# Patient Record
Sex: Female | Born: 1970
Health system: Southern US, Community
[De-identification: ages and names within clinical notes are randomized; demographics above are authoritative.]

## PROBLEM LIST (undated history)

## (undated) DIAGNOSIS — M797 Fibromyalgia: Secondary | ICD-10-CM

## (undated) DIAGNOSIS — R7881 Bacteremia: Secondary | ICD-10-CM

## (undated) DIAGNOSIS — Z87442 Personal history of urinary calculi: Secondary | ICD-10-CM

## (undated) DIAGNOSIS — M069 Rheumatoid arthritis, unspecified: Secondary | ICD-10-CM

## (undated) DIAGNOSIS — R3915 Urgency of urination: Secondary | ICD-10-CM

## (undated) DIAGNOSIS — J45909 Unspecified asthma, uncomplicated: Secondary | ICD-10-CM

## (undated) DIAGNOSIS — F319 Bipolar disorder, unspecified: Secondary | ICD-10-CM

## (undated) DIAGNOSIS — K7682 Hepatic encephalopathy: Secondary | ICD-10-CM

## (undated) DIAGNOSIS — J189 Pneumonia, unspecified organism: Secondary | ICD-10-CM

## (undated) DIAGNOSIS — Z973 Presence of spectacles and contact lenses: Secondary | ICD-10-CM

## (undated) DIAGNOSIS — E43 Unspecified severe protein-calorie malnutrition: Secondary | ICD-10-CM

## (undated) DIAGNOSIS — G43909 Migraine, unspecified, not intractable, without status migrainosus: Secondary | ICD-10-CM

## (undated) DIAGNOSIS — K3184 Gastroparesis: Secondary | ICD-10-CM

## (undated) DIAGNOSIS — K589 Irritable bowel syndrome without diarrhea: Secondary | ICD-10-CM

## (undated) DIAGNOSIS — I959 Hypotension, unspecified: Secondary | ICD-10-CM

## (undated) DIAGNOSIS — Z7289 Other problems related to lifestyle: Secondary | ICD-10-CM

## (undated) DIAGNOSIS — K7200 Acute and subacute hepatic failure without coma: Secondary | ICD-10-CM

## (undated) DIAGNOSIS — E039 Hypothyroidism, unspecified: Secondary | ICD-10-CM

## (undated) DIAGNOSIS — B9620 Unspecified Escherichia coli [E. coli] as the cause of diseases classified elsewhere: Secondary | ICD-10-CM

## (undated) DIAGNOSIS — K219 Gastro-esophageal reflux disease without esophagitis: Secondary | ICD-10-CM

## (undated) DIAGNOSIS — F411 Generalized anxiety disorder: Secondary | ICD-10-CM

## (undated) DIAGNOSIS — R112 Nausea with vomiting, unspecified: Secondary | ICD-10-CM

## (undated) DIAGNOSIS — N201 Calculus of ureter: Secondary | ICD-10-CM

## (undated) DIAGNOSIS — Z8639 Personal history of other endocrine, nutritional and metabolic disease: Secondary | ICD-10-CM

## (undated) DIAGNOSIS — N189 Chronic kidney disease, unspecified: Secondary | ICD-10-CM

## (undated) DIAGNOSIS — K449 Diaphragmatic hernia without obstruction or gangrene: Secondary | ICD-10-CM

## (undated) DIAGNOSIS — F329 Major depressive disorder, single episode, unspecified: Secondary | ICD-10-CM

## (undated) DIAGNOSIS — Z9889 Other specified postprocedural states: Secondary | ICD-10-CM

## (undated) DIAGNOSIS — E079 Disorder of thyroid, unspecified: Secondary | ICD-10-CM

## (undated) DIAGNOSIS — F32A Depression, unspecified: Secondary | ICD-10-CM

## (undated) HISTORY — DX: Bacteremia: R78.81

## (undated) HISTORY — PX: ABDOMINAL HYSTERECTOMY: SHX81

## (undated) HISTORY — PX: TONSILLECTOMY AND ADENOIDECTOMY: SUR1326

## (undated) HISTORY — PX: KNEE ARTHROSCOPY: SUR90

## (undated) HISTORY — DX: Hepatic encephalopathy: K76.82

## (undated) HISTORY — DX: Unspecified Escherichia coli (E. coli) as the cause of diseases classified elsewhere: B96.20

## (undated) HISTORY — DX: Acute and subacute hepatic failure without coma: K72.00

## (undated) HISTORY — PX: EXTRACORPOREAL SHOCK WAVE LITHOTRIPSY: SHX1557

## (undated) HISTORY — DX: Disorder of thyroid, unspecified: E07.9

## (undated) HISTORY — DX: Unspecified severe protein-calorie malnutrition: E43

## (undated) HISTORY — DX: Major depressive disorder, single episode, unspecified: F32.9

## (undated) HISTORY — PX: ESOPHAGOGASTRODUODENOSCOPY: SHX1529

---

## 1998-01-08 ENCOUNTER — Inpatient Hospital Stay (HOSPITAL_COMMUNITY): Admission: AD | Admit: 1998-01-08 | Discharge: 1998-01-08 | Payer: Self-pay | Admitting: Obstetrics and Gynecology

## 1998-03-04 ENCOUNTER — Inpatient Hospital Stay (HOSPITAL_COMMUNITY): Admission: AD | Admit: 1998-03-04 | Discharge: 1998-03-04 | Payer: Self-pay | Admitting: Obstetrics and Gynecology

## 1998-03-06 ENCOUNTER — Inpatient Hospital Stay (HOSPITAL_COMMUNITY): Admission: AD | Admit: 1998-03-06 | Discharge: 1998-03-06 | Payer: Self-pay | Admitting: Obstetrics and Gynecology

## 1998-03-13 ENCOUNTER — Inpatient Hospital Stay (HOSPITAL_COMMUNITY): Admission: AD | Admit: 1998-03-13 | Discharge: 1998-03-15 | Payer: Self-pay | Admitting: Obstetrics and Gynecology

## 1998-04-18 ENCOUNTER — Other Ambulatory Visit: Admission: RE | Admit: 1998-04-18 | Discharge: 1998-04-18 | Payer: Self-pay | Admitting: Obstetrics and Gynecology

## 1998-06-09 ENCOUNTER — Ambulatory Visit (HOSPITAL_COMMUNITY): Admission: RE | Admit: 1998-06-09 | Discharge: 1998-06-09 | Payer: Self-pay | Admitting: Neurology

## 1998-06-10 ENCOUNTER — Ambulatory Visit (HOSPITAL_COMMUNITY): Admission: RE | Admit: 1998-06-10 | Discharge: 1998-06-10 | Payer: Self-pay | Admitting: Neurology

## 1998-09-11 ENCOUNTER — Encounter: Admission: RE | Admit: 1998-09-11 | Discharge: 1998-12-10 | Payer: Self-pay | Admitting: Family Medicine

## 1999-06-25 ENCOUNTER — Other Ambulatory Visit: Admission: RE | Admit: 1999-06-25 | Discharge: 1999-06-25 | Payer: Self-pay | Admitting: Obstetrics and Gynecology

## 1999-11-02 HISTORY — PX: ROUX-EN-Y GASTRIC BYPASS: SHX1104

## 1999-11-16 ENCOUNTER — Encounter: Payer: Self-pay | Admitting: *Deleted

## 1999-11-16 ENCOUNTER — Ambulatory Visit (HOSPITAL_COMMUNITY): Admission: RE | Admit: 1999-11-16 | Discharge: 1999-11-16 | Payer: Self-pay | Admitting: *Deleted

## 1999-11-16 ENCOUNTER — Encounter: Admission: RE | Admit: 1999-11-16 | Discharge: 1999-11-16 | Payer: Self-pay | Admitting: *Deleted

## 1999-12-31 ENCOUNTER — Encounter: Admission: RE | Admit: 1999-12-31 | Discharge: 1999-12-31 | Payer: Self-pay | Admitting: *Deleted

## 1999-12-31 ENCOUNTER — Encounter: Payer: Self-pay | Admitting: *Deleted

## 2000-01-05 ENCOUNTER — Ambulatory Visit (HOSPITAL_COMMUNITY): Admission: RE | Admit: 2000-01-05 | Discharge: 2000-01-05 | Payer: Self-pay | Admitting: *Deleted

## 2000-01-05 ENCOUNTER — Encounter: Payer: Self-pay | Admitting: *Deleted

## 2000-01-28 ENCOUNTER — Ambulatory Visit (HOSPITAL_BASED_OUTPATIENT_CLINIC_OR_DEPARTMENT_OTHER): Admission: RE | Admit: 2000-01-28 | Discharge: 2000-01-28 | Payer: Self-pay | Admitting: Orthopedic Surgery

## 2000-01-28 HISTORY — PX: WRIST GANGLION EXCISION: SUR520

## 2000-07-27 ENCOUNTER — Encounter (INDEPENDENT_AMBULATORY_CARE_PROVIDER_SITE_OTHER): Payer: Self-pay | Admitting: Specialist

## 2000-07-27 ENCOUNTER — Ambulatory Visit (HOSPITAL_COMMUNITY): Admission: RE | Admit: 2000-07-27 | Discharge: 2000-07-27 | Payer: Self-pay | Admitting: *Deleted

## 2000-08-23 ENCOUNTER — Other Ambulatory Visit: Admission: RE | Admit: 2000-08-23 | Discharge: 2000-08-23 | Payer: Self-pay | Admitting: Obstetrics and Gynecology

## 2000-11-15 ENCOUNTER — Emergency Department (HOSPITAL_COMMUNITY): Admission: EM | Admit: 2000-11-15 | Discharge: 2000-11-15 | Payer: Self-pay | Admitting: Emergency Medicine

## 2000-11-15 ENCOUNTER — Encounter: Payer: Self-pay | Admitting: Emergency Medicine

## 2000-11-20 HISTORY — PX: OTHER SURGICAL HISTORY: SHX169

## 2000-11-21 ENCOUNTER — Ambulatory Visit (HOSPITAL_COMMUNITY): Admission: RE | Admit: 2000-11-21 | Discharge: 2000-11-21 | Payer: Self-pay | Admitting: Urology

## 2000-11-21 ENCOUNTER — Encounter: Payer: Self-pay | Admitting: Urology

## 2001-02-02 ENCOUNTER — Encounter: Payer: Self-pay | Admitting: Urology

## 2001-02-02 ENCOUNTER — Encounter: Admission: RE | Admit: 2001-02-02 | Discharge: 2001-02-02 | Payer: Self-pay | Admitting: Urology

## 2001-06-08 ENCOUNTER — Encounter: Admission: RE | Admit: 2001-06-08 | Discharge: 2001-06-08 | Payer: Self-pay | Admitting: *Deleted

## 2001-06-08 ENCOUNTER — Encounter: Payer: Self-pay | Admitting: *Deleted

## 2001-09-14 ENCOUNTER — Other Ambulatory Visit: Admission: RE | Admit: 2001-09-14 | Discharge: 2001-09-14 | Payer: Self-pay | Admitting: Obstetrics and Gynecology

## 2001-10-27 ENCOUNTER — Emergency Department (HOSPITAL_COMMUNITY): Admission: EM | Admit: 2001-10-27 | Discharge: 2001-10-27 | Payer: Self-pay | Admitting: Emergency Medicine

## 2001-11-05 ENCOUNTER — Emergency Department (HOSPITAL_COMMUNITY): Admission: EM | Admit: 2001-11-05 | Discharge: 2001-11-05 | Payer: Self-pay | Admitting: Emergency Medicine

## 2002-03-07 ENCOUNTER — Ambulatory Visit (HOSPITAL_COMMUNITY): Admission: RE | Admit: 2002-03-07 | Discharge: 2002-03-07 | Payer: Self-pay | Admitting: Urology

## 2002-03-07 ENCOUNTER — Encounter: Admission: RE | Admit: 2002-03-07 | Discharge: 2002-03-07 | Payer: Self-pay | Admitting: Urology

## 2002-03-07 ENCOUNTER — Encounter: Payer: Self-pay | Admitting: Urology

## 2002-07-06 ENCOUNTER — Encounter: Admission: RE | Admit: 2002-07-06 | Discharge: 2002-07-06 | Payer: Self-pay | Admitting: Urology

## 2002-07-06 ENCOUNTER — Encounter: Payer: Self-pay | Admitting: Urology

## 2002-07-09 ENCOUNTER — Ambulatory Visit (HOSPITAL_BASED_OUTPATIENT_CLINIC_OR_DEPARTMENT_OTHER): Admission: RE | Admit: 2002-07-09 | Discharge: 2002-07-09 | Payer: Self-pay | Admitting: Urology

## 2002-07-09 HISTORY — PX: OTHER SURGICAL HISTORY: SHX169

## 2002-07-25 ENCOUNTER — Encounter: Admission: RE | Admit: 2002-07-25 | Discharge: 2002-07-25 | Payer: Self-pay | Admitting: Urology

## 2002-07-25 ENCOUNTER — Encounter: Payer: Self-pay | Admitting: Urology

## 2002-12-26 ENCOUNTER — Other Ambulatory Visit: Admission: RE | Admit: 2002-12-26 | Discharge: 2002-12-26 | Payer: Self-pay | Admitting: Obstetrics and Gynecology

## 2003-02-26 ENCOUNTER — Encounter (INDEPENDENT_AMBULATORY_CARE_PROVIDER_SITE_OTHER): Payer: Self-pay | Admitting: Specialist

## 2003-02-26 ENCOUNTER — Ambulatory Visit (HOSPITAL_COMMUNITY): Admission: RE | Admit: 2003-02-26 | Discharge: 2003-02-26 | Payer: Self-pay | Admitting: Obstetrics and Gynecology

## 2003-02-26 HISTORY — PX: OTHER SURGICAL HISTORY: SHX169

## 2003-02-27 ENCOUNTER — Inpatient Hospital Stay (HOSPITAL_COMMUNITY): Admission: AD | Admit: 2003-02-27 | Discharge: 2003-02-27 | Payer: Self-pay | Admitting: Obstetrics and Gynecology

## 2004-01-15 ENCOUNTER — Encounter: Admission: RE | Admit: 2004-01-15 | Discharge: 2004-01-15 | Payer: Self-pay | Admitting: Internal Medicine

## 2004-01-17 ENCOUNTER — Encounter (INDEPENDENT_AMBULATORY_CARE_PROVIDER_SITE_OTHER): Payer: Self-pay | Admitting: *Deleted

## 2004-01-17 ENCOUNTER — Ambulatory Visit (HOSPITAL_COMMUNITY): Admission: RE | Admit: 2004-01-17 | Discharge: 2004-01-18 | Payer: Self-pay | Admitting: General Surgery

## 2004-01-17 HISTORY — PX: LAPAROSCOPIC CHOLECYSTECTOMY: SUR755

## 2004-01-27 ENCOUNTER — Ambulatory Visit (HOSPITAL_COMMUNITY): Admission: RE | Admit: 2004-01-27 | Discharge: 2004-01-27 | Payer: Self-pay | Admitting: Urology

## 2005-11-19 ENCOUNTER — Other Ambulatory Visit: Admission: RE | Admit: 2005-11-19 | Discharge: 2005-11-19 | Payer: Self-pay | Admitting: Obstetrics and Gynecology

## 2005-12-28 ENCOUNTER — Observation Stay (HOSPITAL_COMMUNITY): Admission: RE | Admit: 2005-12-28 | Discharge: 2005-12-29 | Payer: Self-pay | Admitting: Obstetrics and Gynecology

## 2005-12-28 ENCOUNTER — Encounter (INDEPENDENT_AMBULATORY_CARE_PROVIDER_SITE_OTHER): Payer: Self-pay | Admitting: *Deleted

## 2005-12-28 HISTORY — PX: LAPAROSCOPIC ASSISTED VAGINAL HYSTERECTOMY: SHX5398

## 2006-12-16 ENCOUNTER — Ambulatory Visit (HOSPITAL_COMMUNITY): Admission: RE | Admit: 2006-12-16 | Discharge: 2006-12-16 | Payer: Self-pay | Admitting: Urology

## 2006-12-16 HISTORY — PX: OTHER SURGICAL HISTORY: SHX169

## 2007-03-13 ENCOUNTER — Ambulatory Visit (HOSPITAL_COMMUNITY): Admission: RE | Admit: 2007-03-13 | Discharge: 2007-03-13 | Payer: Self-pay | Admitting: Internal Medicine

## 2007-07-07 ENCOUNTER — Encounter: Admission: RE | Admit: 2007-07-07 | Discharge: 2007-07-07 | Payer: Self-pay | Admitting: Internal Medicine

## 2007-12-08 ENCOUNTER — Encounter: Admission: RE | Admit: 2007-12-08 | Discharge: 2007-12-08 | Payer: Self-pay | Admitting: Obstetrics and Gynecology

## 2008-06-07 ENCOUNTER — Encounter: Admission: RE | Admit: 2008-06-07 | Discharge: 2008-06-07 | Payer: Self-pay | Admitting: Obstetrics and Gynecology

## 2008-12-09 ENCOUNTER — Encounter: Admission: RE | Admit: 2008-12-09 | Discharge: 2008-12-09 | Payer: Self-pay | Admitting: Obstetrics and Gynecology

## 2009-02-21 ENCOUNTER — Ambulatory Visit (HOSPITAL_BASED_OUTPATIENT_CLINIC_OR_DEPARTMENT_OTHER): Admission: RE | Admit: 2009-02-21 | Discharge: 2009-02-22 | Payer: Self-pay | Admitting: Urology

## 2009-02-21 HISTORY — PX: OTHER SURGICAL HISTORY: SHX169

## 2010-06-12 ENCOUNTER — Ambulatory Visit (HOSPITAL_BASED_OUTPATIENT_CLINIC_OR_DEPARTMENT_OTHER): Admission: RE | Admit: 2010-06-12 | Discharge: 2010-06-12 | Payer: Self-pay | Admitting: Urology

## 2010-06-12 HISTORY — PX: OTHER SURGICAL HISTORY: SHX169

## 2010-07-01 ENCOUNTER — Encounter: Admission: RE | Admit: 2010-07-01 | Discharge: 2010-07-01 | Payer: Self-pay | Admitting: Surgery

## 2010-07-10 ENCOUNTER — Ambulatory Visit (HOSPITAL_COMMUNITY): Admission: RE | Admit: 2010-07-10 | Discharge: 2010-07-10 | Payer: Self-pay | Admitting: Surgery

## 2010-08-03 ENCOUNTER — Encounter (INDEPENDENT_AMBULATORY_CARE_PROVIDER_SITE_OTHER): Payer: Self-pay | Admitting: Surgery

## 2010-08-03 ENCOUNTER — Inpatient Hospital Stay (HOSPITAL_COMMUNITY): Admission: RE | Admit: 2010-08-03 | Discharge: 2010-08-04 | Payer: Self-pay | Admitting: Surgery

## 2010-08-03 HISTORY — PX: OTHER SURGICAL HISTORY: SHX169

## 2010-11-21 ENCOUNTER — Encounter: Payer: Self-pay | Admitting: Internal Medicine

## 2011-01-13 LAB — CALCIUM: Calcium: 8.2 mg/dL — ABNORMAL LOW (ref 8.4–10.5)

## 2011-01-14 LAB — COMPREHENSIVE METABOLIC PANEL
ALT: 24 U/L (ref 0–35)
AST: 25 U/L (ref 0–37)
Albumin: 3.7 g/dL (ref 3.5–5.2)
Alkaline Phosphatase: 201 U/L — ABNORMAL HIGH (ref 39–117)
CO2: 28 mEq/L (ref 19–32)
Chloride: 108 mEq/L (ref 96–112)
GFR calc Af Amer: >60 mL/min (ref >60)
GFR calc non Af Amer: >60 mL/min (ref >60)
Potassium: 4 mEq/L (ref 3.5–5.1)
Sodium: 140 mEq/L (ref 135–145)
Total Bilirubin: 0.3 mg/dL (ref 0.3–1.2)

## 2011-01-14 LAB — CBC
Hemoglobin: 12.3 g/dL (ref 12.0–15.0)
Platelets: 355 10*3/uL (ref 150–400)
RBC: 4.21 MIL/uL (ref 3.87–5.11)
WBC: 6.9 10*3/uL (ref 4.0–10.5)

## 2011-01-15 LAB — POCT I-STAT, CHEM 8
BUN: 11 mg/dL (ref 6–23)
Creatinine, Ser: 0.8 mg/dL (ref 0.4–1.2)
Glucose, Bld: 86 mg/dL (ref 70–99)
Sodium: 142 mEq/L (ref 135–145)
TCO2: 24 mmol/L (ref 0–100)

## 2011-03-16 NOTE — Op Note (Signed)
NAME:  Jordan, Russell           ACCOUNT NO.:  [id number]   MEDICAL RECORD NO.:  [id number]          PATIENT TYPE:  AMB   LOCATION:  [facility]                         FACILITY:  [facility]   PHYSICIAN:  Sigmund I. Patsi Sears, M.D.DATE OF BIRTH:  1971/09/27   DATE OF PROCEDURE:  DATE OF DISCHARGE:                               OPERATIVE REPORT   PREOPERATIVE DIAGNOSES:  Pelvic floor prolapse with stress urinary  continence.   POSTOPERATIVE DIAGNOSES:  Grade 3 rectocele, grade 1 cystocele,  urethrocele.   OPERATIONS:  1. Solyx transurethral sling.  2. Posterior Pinnacle apical pelvic floor sacrospinous repair      (colpopexy).  3. Cystoscopy.  4. Bilateral retrograde pyelogram interpretation.  5. Bilateral ureteral catheter placement.   SURGEON:  Sigmund I. Patsi Sears, M.D.   Jordan HeadsPernell Russell.   ESTIMATED BLOOD LOSS:  200 mL.   PREPARATION:  After appropriate preanesthesia, the patient was brought  to the operating room and placed on the operating room table in dorsal  supine position where general LMA anesthesia was induced.  She was then  replaced in dorsal lithotomy position where the pubis was prepped with  Betadine solution and draped in the usual fashion.   HISTORY:  Jordan Russell is a 40 year old female complaining of stress  urinary continence and my bladder has dropped.  She is wearing four  pads a day, and notes that her husband complains of feeling a bulge  during sexual intercourse.  The patient had urodynamics showing a  maximum bladder capacity of 450 mL with inability to void until the  rectocele is reduced.  The patient is now for Solyx sling and vaginal  repair.   PROCEDURE IN DETAIL:  Vaginal inspection reveals grade 3 rectocele as  well as a cystocele grade 1 to 2 and a urethrocele.  Seven mL of  Marcaine 0.5% with epinephrine 1:200,000 was injected into the  periurethral space.  Cystoscopy was accomplished, and bilateral  retrograde pyelograms performed which  shows normal ureters.  Ureteral  catheters were left bilaterally, and tied together with 2-0 silk  sutures.  Foley catheter was placed and the ureteral stents were tied to  the Foley catheter, and placed in a glove for drainage capture.  The  left arm of the Solyx sling was then placed in the left obturator  internus.  The sling was tested, and does not pull out.  Pillowing  affect was noted.  The wound was then closed with running 2-0 Vicryl  suture.  Further vaginal inspection reveals that the patient has a large  rectocele, and that the cystocele appears to be inconsequential.  The  apex was descended and detached.  It was decided to place a posterior  Pinnacle apical repair via the mesh to sacrospinous ligament approach.  Marcaine with epinephrine was injected into the rectal mucosa in order  to prevent blood loss, and also to afford some hydrodissection.  Using a  15 blade an incision was made across the area of the pineal gland, and a  triangulation was accomplished.  This piece of tissue was removed.  Tissue dissection was accomplished in the midline, and incision was  made  to approximately 2 cm distal to the apex of the vagina.  Tissue was then  dissected bilaterally with blunt dissection, to the lateral sulcus.  The  ischial spines were identified, and the sacrospinous ligaments  identified bilaterally.  Using a Capio device, sutures were placed in  the sacrospinous ligament, approximately 1 cm proximal to the ischial  spine.  A modified posterior Pinnacle mesh was then placed, and  periapical sutures were placed.  The mesh lay flat against the  rectocele.  Sutures were then placed as tacking sutures with 2-0 Vicryl  suture to keep the mesh laying flat.  The distal portion of the mesh was  trimmed, and tacked in place with 2-0 Vicryl suture.  Because of distal  bleeding, within the wound, and over sewed with 2-0 Vicryl suture.  No  bleeding was noted after closure of the mucosa.   The vagina was then  packed with vaginal packing with Estrace cream and the Foley was left in  place.  The ureteral stents were removed after cutting the 2-0 silk  suture.  The patient was given IV Toradol, awakened and taken to the  recovery room in excellent condition.      Sigmund I. Patsi Sears, M.D.  Electronically Signed     SIT/MEDQ  D:  02/21/2009  T:  02/21/2009  Job:  [id number]

## 2011-03-19 NOTE — Discharge Summary (Signed)
[name], Jordan Russell           ACCOUNT NO.:  [id number]   MEDICAL RECORD NO.:  [id number]          PATIENT TYPE:  OBV   LOCATION:  9312                          FACILITY:  [facility]   PHYSICIAN:  Dineen Kid. Rana Snare, M.D.    DATE OF BIRTH:  Aug 20, 1971   DATE OF ADMISSION:  12/28/2005  DATE OF DISCHARGE:  12/29/2005                                 DISCHARGE SUMMARY   HISTORY OF PRESENT ILLNESS:  [name] is a 40 year old G3, P1 worsening  pelvic pain, abnormal bleeding, pressure, and dyspareunia.  She has a small  endometrial mass as seen on infusion ultrasound with suspicion for polyp  with a benign endometrial biopsy.  Because of worsening pain, pressure,  dyspareunia, menorrhagia definitive surgical intervention and requests  hysterectomy with preservation of at least one of her ovaries.  She does  have a history of recurrent ovarian cysts and would like it would removed if  it does have ovarian cysts on it.  Risks and benefits were discussed at  length and informed consent was obtained before the surgery.  See history  and physical for further details.   HOSPITAL COURSE:  Patient underwent a laparoscopic assisted vaginal  hysterectomy.  Surgery was uncomplicated.  The findings were essentially  normal at the time of surgery.  Estimated blood loss was 150 mL.  Her  postoperative care was unremarkable with good return of bowel function.  She  was ambulating, tolerating a regular diet.  By postoperative day #1 her  hemoglobin was 9.4 which is down only slightly from 10.2 pre admission.  By  the time of discharge her incision was clean, dry, and intact.  Her abdomen  was soft, nontender.  Bowel sounds were positive.  Patient was discharged  home.  Will follow up in the office in two to three weeks.  Sent home with a  routine instruction sheet for hysterectomy.  Told to return for increased  pain, fever, or bleeding.  Was given a prescription for Tylox #30.      Dineen Kid Rana Snare, M.D.  Electronically Signed     DCL/MEDQ  D:  01/25/2006  T:  01/26/2006  Job:  [id number]

## 2011-03-19 NOTE — H&P (Signed)
[name], Russell           ACCOUNT NO.:  [id number]   MEDICAL RECORD NO.:  [id number]          PATIENT TYPE:  AMB   LOCATION:  [facility]                           FACILITY:  [facility]   PHYSICIAN:  Dineen Kid. Rana Snare, M.D.    DATE OF BIRTH:  1970-11-11   DATE OF ADMISSION:  12/28/2005  DATE OF DISCHARGE:                                HISTORY & PHYSICAL   HISTORY OF PRESENT ILLNESS:  [name] is a 40 year old, G3, P1 with  worsening pelvic pain and abnormal uterine bleeding, pelvic pressure, and  dyspareunia.  She also has a small endometrial mass on saline infusion  ultrasound which is suspicious for polyp; however, endometrial biopsy  returns benign.  Because of worsening pain, pressure, and dyspareunia in  addition to menometrorrhagia, the patient desires definitive surgical  intervention and requests hysterectomy.  She does desire preservation of at  least one of her ovaries.  She does have a history of ovarian cyst in the  past and does not mind if one of the ovaries is removed but would like to  preserve at least one.  She presents for laparoscopic cysto vaginal  hysterectomy, possible unilateral salpingo-oophorectomy.  Saline infusion  ultrasound on November 29, 2005, showed a 6.8 x 3.6 x 4.9 cm uterus, normal-  appearing ovaries, endometrial lining, measured 1.8 mm.  There is a 3 x 6  mm mass consistent with a polyp.   PAST MEDICAL HISTORY:  1.  Obesity, status post gastric bypass.  2.  She also has mitral valve prolapse.  3.  Asthma.  4.  History of depression.   PAST OBSTETRICAL HISTORY:  She has had 1 vaginal delivery, 2 miscarriages.   PAST SURGICAL HISTORY:  1.  She has had 2 D&E's.  2.  Tonsillectomy.  3.  Gastric bypass.  4.  Laparoscopic ovarian cystectomy and bilateral tubal ligation.   MEDICATIONS:  1.  Seroquel 200 mg daily.  2.  Lexapro 20 mg daily.   PHYSICAL EXAMINATION:  VITAL SIGNS:  Blood pressure is 120/72.  HEART:  Regular rate and rhythm.  LUNGS:   Clear to auscultation bilaterally.  ABDOMEN:  Nondistended, nontender.  PELVIC:  Deferred due to the pelvic ultrasound.   IMPRESSION:  1.  Menometrorrhagia.  2.  Abnormal uterine bleeding.  3.  Pelvic pain.  4.  Pressure and dyspareunia.   I discussed different options at length with [name].  We do have a benign  endometrial biopsy.  Did recommend at minimum hysteroscopy D&C for  evaluation and removal of the polyp.  She desires more definitive surgical  intervention and requests laparoscopically-assisted vaginal hysterectomy and  possible unilateral salpingo-oophorectomy depending upon the nature of her  ovaries at the time of surgery.  Discussed the benefits of both procedures  at length.  She does want to proceed with LAVH and BSO.  The risk of  infection, bleeding, damage to bowel, bladder, ureters, possibility this may  not alleviate the pain.  It could be even worse.  Risks associated with  blood transfusion, risks associated with anesthesia were all discussed.  She  has given her informed consent.  Dineen Kid Rana Snare, M.D.  Electronically Signed     DCL/MEDQ  D:  12/27/2005  T:  12/27/2005  Job:  [id number]

## 2011-03-19 NOTE — Op Note (Signed)
[place]  Patient:    KINDLE, STROHMEIER                  MRN: [id number] Proc. Date: 01/28/00 Adm. Date:  [id number] Attending:  Ronne Binning CC:         Nicki Reaper, M.D., 2 copies to the office                           Operative Report  PREOPERATIVE DIAGNOSIS:  Volar radial wrist ganglion, right wrist.  POSTOPERATIVE DIAGNOSIS:  Volar radial wrist ganglion, right wrist.  OPERATION:  Incision volar radial wrist ganglion.  SURGEON:  Nicki Reaper, M.D.  ASSISTANT:  Joaquin Courts, R.N.  ANESTHESIA:  IV regional and general.  ANESTHESIOLOGIST:  Janetta Hora. Gelene Mink, M.D.  HISTORY:  The patient is a 40 year old female with a history of a mass on the volar radial aspect of the right wrist.  She has had an ultrasound done revealing a cyst.  PROCEDURE:  The patient was brought to the operating room where an upper arm IV  regional anesthesia was carried out without difficulty.  She was prepped and draped using Betadine scrub and solution.  A curvilinear incision was made over the mass and carried down through the subcutaneous tissue.  The patient complained of pain and this was locally infiltrated.  She continued to complain of pain and general anesthesia was given.  The wound was deepened.  The cyst was immediately apparent. This was followed down, protecting the radial artery, which it was intimately involved with.  A portion of the cyst wall was left with the artery.  The cyst as then traced down to its stalk, which was found to enter the volar radial wrist capsule.  This area was opened, debrided, and irrigated and closed with figure-of-eight 4-0 Vicryl suture.  The subcutaneous tissue was closed with 4-0  Vicryl and the skin with a subcuticular 3-0 Monocryl suture.  Steri-Strips were  applied.  A sterile compressive dressing and splint were applied.  Patient tolerated the procedure well and was taken to the recovery room  for observation in satisfactory condition.  She is discharged home to return to [facility] in one week on Vicodin and erythromycin. DD:  01/28/00 TD:  01/28/00 Job: [id number]

## 2011-03-19 NOTE — Op Note (Signed)
NAME:  Jordan Russell, Jordan Russell                     ACCOUNT NO.:  [id number]   MEDICAL RECORD NO.:  [id number]                   PATIENT TYPE:  OIB   LOCATION:  2854                                 FACILITY:  [facility]   PHYSICIAN:  Sharlet Salina T. Hoxworth, M.D.          DATE OF BIRTH:  [birth year]   DATE OF PROCEDURE:  01/17/2004  DATE OF DISCHARGE:                                 OPERATIVE REPORT   PREOPERATIVE DIAGNOSIS:  Cholelithiasis, cholecystitis.   POSTOPERATIVE DIAGNOSIS:  Cholelithiasis, cholecystitis.   OPERATION/PROCEDURE:  Laparoscopic cholecystectomy with intraoperative  cholangiogram.   SURGEON:  Sharlet Salina T. Hoxworth, M.D.   ASSISTANT:  Gabrielle Dare. Janee Morn, M.D.   ANESTHESIA:  General.   BRIEF HISTORY:  Jordan Russell is a 41 year old white female,  approximately two years following laparoscopic Roux-en-Y gastric bypass for  morbid obesity.  She has developed increasingly frequent and severe episodes  of right upper quadrant abdominal pain, getting quite a bit worse in the  last couple of weeks.  She had a workup including gallbladder ultrasound  showing multiple gallstones and a normal-appearing common bile duct.  She  has had mildly elevated alkaline phosphatases and transaminases.  Laparoscopic cholecystectomy with intraoperative cholangiogram for apparent  symptomatic cholelithiasis had been recommended and accepted.  The nature of  this procedure, its indications, risks of bleeding, infection, bile lead,  bile duct injury, and possible need for open procedure were  discussed and  understood.  She is now brought to the operating room for this procedure.   DESCRIPTION OF PROCEDURE:  The patient was brought to the operating room and  placed in the supine position on the operating table.  General endotracheal  anesthesia was induced.  She received preoperative antibiotics.  PAS were  placed.  The abdomen was sterilely prepped and draped.  Local anesthesia was  used to  infiltrate the trocar sites prior to the incisions.  A 1 cm incision  was made at the umbilicus and dissection carried down through the midline  fascia which was sharply incised for 1 cm.  The peritoneum then opened under  direct vision.  Through a mattress suture of 0 Vicryl, the Hasson trocar was  placed and pneumoperitoneum established.  Under direct vision a 10 mm trocar  was placed in the subxiphoid area and two 5 mm trocars along the right  subcostal margin.  The gallbladder was visualized and did not appear  inflamed.  There were really no visible adhesions in the abdomen.  The Roux  limb could be seen in the antecolic position going up under the left lobe of  the liver.  The bowel appeared normal. The fundus of the gallbladder was  grasped and elevated and the infundibulum retracted inferolaterally.  Peritoneum anterior and posterior to Calot's triangle was incised.  Fibrofatty tissue was stripped off the neck of the gallbladder toward the  porta hepatis.  The distal gallbladder was thoroughly dissected and cystic  duct identified.  The cystic  duct was dissected out over about 1 cm and the  cystic duct/gallbladder junction was dissected 360 degrees.  When the  anatomy was clear, the cystic duct was clipped at the gallbladder junction  and operative cholangiogram obtained through the cystic duct.  This showed  good filling of a normal common bile duct and intrahepatic ducts with free  flow into the duodenum, and no filling defects.  Following this, the  cholangiocath was removed.  The cystic duct was doubly clipped proximally  and divided.  Cystic artery was divided between two proximal and one distal  clips.  The gallbladder was then dissected free from its bed using hook  cautery and removed through the umbilicus.  Complete hemostasis was obtained  in the gallbladder bed.  The right upper quadrant was irrigated and  inspected for hemostasis.  Trocars were removed under direct  vision.  All  CO2 was evacuated from the peritoneal cavity.  The mattress suture was  secured at the umbilicus.  The skin incisions were closed with interrupted  subcuticular 5-0 Monocryl and Steri-Strips.  Sponge, needle and instrument  counts were correct.  Dry sterile dressings were applied and the patient  taken to the recovery room in good condition.                                               Lorne Skeens. Hoxworth, M.D.    Tory Emerald  D:  01/17/2004  T:  01/20/2004  Job:  [id number]

## 2011-03-19 NOTE — H&P (Signed)
   NAME:  Jordan Russell, Jordan Russell                     ACCOUNT NO.:  [id number]   MEDICAL RECORD NO.:  [id number]                   PATIENT TYPE:  AMB   LOCATION:  [facility]C                                  FACILITY:  [facility]   PHYSICIAN:  Dineen Kid. Rana Snare, M.D.                 DATE OF BIRTH:  07-16-1971   DATE OF ADMISSION:  02/26/2003  DATE OF DISCHARGE:                                HISTORY & PHYSICAL   HISTORY OF PRESENT ILLNESS:  The patient is a 40 year old G3 P1 with  worsening pelvic pain and a left ovarian cyst.  Cyst by ultrasound on  02/13/2003 shows 8.9 x 7.4 mm cyst which has hemorrhagic component but the  patient continues to have left-sided pain which is interfering with her day-  to-day life and wants further evaluation.  She also adamantly desires  sterilization and presents for bilateral tubal ligation.   PAST MEDICAL HISTORY:  Past medical history is significant for morbid  obesity; she has also had a gastric bypass surgery for that.  She also has  mitral valve prolapse, asthma, and a history of depression.   PAST OBSTETRICAL HISTORY:  She has had one vaginal delivery and two  miscarriages.   PAST SURGICAL HISTORY:  She has had two D&E's, tonsillectomy, and gastric  bypass surgery.   PHYSICAL EXAMINATION:  VITAL SIGNS:  Her blood pressure is 138/70.  HEART:  Regular rate and rhythm.  LUNGS:  Clear to auscultation bilaterally.  ABDOMEN:  Obese, nontender.  PELVIC:  Uterus is anteverted, mobile, and nontender.  Minimal tenderness to  deep palpation.   IMPRESSION:  Pelvic pain and left ovarian cyst.   PLAN:  The patient desires definitive surgical evaluation and treatment of  this - not responsive to conservative medical management.  Plan laparoscopy  with left ovarian cystectomy, we will plan to preserve the left ovary,  possible lysis of adhesions or ablation of endometriosis implants.  The  patient also adamantly desires sterilization.  Risks and benefits of  sterilization  were discussed at length which include but not limited to risk  of infection, bleeding, damage to bowel, bladder, uterus, tubes, ovaries,  possibility of tubal failure quoted at 5:1000 failure rate.  The patient  gives her informed consent.                                               Dineen Kid Rana Snare, M.D.    DCL/MEDQ  D:  02/25/2003  T:  02/25/2003  Job:  [id number]

## 2011-03-19 NOTE — Op Note (Signed)
Jordan Russell, Jordan Russell           ACCOUNT NO.:  [id number]   MEDICAL RECORD NO.:  [id number]          PATIENT TYPE:  AMB   LOCATION:  DAY                          FACILITY:  [facility]   PHYSICIAN:  Cornelious Bryant, MD     DATE OF BIRTH:  1970-11-29   DATE OF PROCEDURE:  12/16/2006  DATE OF DISCHARGE:                               OPERATIVE REPORT   PREOP DIAGNOSIS:  Right flank pain.   POSTOP DIAGNOSIS:  Right flank pain.   PROCEDURES PERFORMED:  1. Cystoscopy.  2. Right retrograde pyelography.  3. Right ureteroscopy.  4. Right stent insertion (6 x 26 with tether).   SURGEON:  Sigmund I. Patsi Sears, M.D.   ASSISTANT:  Cornelious Bryant, MD   ANESTHESIA:  General.   SPECIMEN:  None applicable.   ESTIMATED BLOOD LOSS:  Minimal.   COMPLICATIONS:  None.   INDICATIONS:  This is a 40 year old lady with a right flank pain that  has persisted.  The patient has had a CT scan done that did not reveal  any evidence of nephrolithiasis; however, due to her obesity the  presence of a small stone could not be ruled out in the ureter.  After  extensive counseling, the patient elected for right ureteroscopy.   DESCRIPTION OF PROCEDURE:  The patient was brought to the operating  room.  Anesthesia was induced.  Preop antibiotics were given.  The  patient was placed in the dorsal lithotomy position.  She was prepped  and draped in normal sterile fashion.  Bilateral SCDs were applied; and  all pressure points were adequately padded to avoid neuropathy or  compartment syndrome.  A 22-French sheath cystoscope was then used to  access the bladder.  The urethral mucosa and the bladder mucosa was  devoid of any masses, lesions, or stones.  Both ureteral orifices were  identified and were effluxing urine normally.  The bladder was not  trabeculated.   The right ureteral orifice was identified, and a 6-French open-ended  ureteral catheter was advanced into the opening of the right ureteral  orifice.   Gentle injection of contrast opacified the right collecting  system with no evidence of any stones or filling defects.  A sensor wire  was then advanced into the 6-French open-ended ureteral catheter at the  level of the kidney.  The ureteral catheter was then taken out leaving  the stent in place.  Ureteroscopy was then performed of the entire right  ureter from the right ureteral orifice up to the renal pelvis.  There  was no evidence of any obstructing kidney stone, filling defect, or any  evidence of obstruction otherwise.  The ureteroscope was then taken out,  and a 6 x 26 French stent was then inserted; and the tether was kept in  place, and tied very tight.  The patient was then awakened up in the  operating room, extubated, and taken in stable condition to PACU.   COMPLICATIONS:  None.  Please note that Dr. Patsi Sears was present and  participated in the entire procedure, as he was responsible surgeon.  ______________________________  Cornelious Bryant, MD     SK/MEDQ  D:  12/16/2006  T:  12/16/2006  Job:  [id number]

## 2011-03-19 NOTE — Op Note (Signed)
[facility]  Patient:    Jordan Russell, Jordan Russell                  MRN: [id number] Proc. Date: 11/20/00 Adm. Date:  [id number] Attending:  Laqueta Jean                           Operative Report  PREOPERATIVE DIAGNOSIS:  Passed left ureteral calculus with continued bilateral flank pain.  POSTOPERATIVE DIAGNOSIS:  Passed left ureteral calculus with continued bilateral flank pain.  OPERATION: Cystourethroscopy with bilateral retrograde pyelogram.  SURGEON:  Sigmund I. Patsi Sears, M.D.  ANESTHESIA:  General.  PROCEDURE PREPARATION:  After appropriate preanesthesia, the patient was brought to the operating room and placed on the operating table in the dorsal supine position where general endotracheal anesthesia was achieved.  She was then replaced in the dorsolithotomy position with the pubis prepped with Betadine solution and draped in the usual fashion.  HISTORY:  This 40 year old female has a strong family history for recurrent kidney stones, both calcium oxalate and uric acid.  She developed acute left flank pain, has known left ureteral calculus which she passed several days ago.  She has continued to have bilateral flank pain, however, and believes that she now has a right ureteral calculus.  She had repeat non-contrast CT which did not show a stone; but, because of the patients large size (360 pounds), the patient is now for retrograde pyelogram to see if there could be ureteral calculus present.  DESCRIPTION OF PROCEDURE:  The patient was placed in dorsolithotomy position where the pubis was prepped with Betadine solution and draped in the usual fashion.  Cystoscopy revealed normal-appearing bladder, and bilateral retrograde pyelograms were normal.  The contrast was observed through the entire urinary system. 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  The bladder was drained of  fluid, and B & O suppository was placed.  The patient was awakened after being given IV Toradol, taken to the recovery room in good condition. DD:  11/21/00 TD:  11/21/00 Job: [id number]

## 2011-03-19 NOTE — Op Note (Signed)
   Jordan Russell, HUND                    ACCOUNT NO.:  [id number]   MEDICAL RECORD NO.:  [id number]                   PATIENT TYPE:  AMB   LOCATION:  [facility]                                 FACILITY:  [facility]   PHYSICIAN:  Sigmund I. Patsi Sears, M.D.         DATE OF BIRTH:  [birth year]   DATE OF PROCEDURE:  07/09/2002  DATE OF DISCHARGE:                                 OPERATIVE REPORT   PREOPERATIVE DIAGNOSIS:  Left lower ureteral calculus.   POSTOPERATIVE DIAGNOSIS:  Left lower ureteral calculus.   OPERATION:  1. Cystourethroscopy.  2. Bilateral retrograde pyelogram with interpretation.  3. Left ureteroscopy and basket extraction of left ureteral stone.  4. Left double-J catheter.   SURGEON:  Sigmund I. Patsi Sears, M.D.   ANESTHESIA:  General (LMA).   PREPARATION:  After appropriate preanesthesia, the patient is brought to the  operating room and placed on the operating table in the dorsal supine  position where general LMA anesthesia was introduced.  She was then re-  placed in the dorsal lithotomy position, and the pubis was prepped with  Betadine solution and draped in the usual fashion.   DESCRIPTION OF PROCEDURE:  Cystourethroscopy was accomplished.  Bilateral  retrograde pyelogram was accomplished which showed that the patient had a  left lower ureteral calculus.  The calcifications in the right low pelvis  showed that the stones were outside the ureter.  The renal pelvis  bilaterally appeared to be within normal limits.  The calices were not  dilated, and there was left upper, mid, and lower ureteral dilation.   Ureteroscopy was accomplished, and stone was identified in the left lower  ureter, and this was basket-extracted.  Retrograde pyelogram again revealed  no evidence of further stones or extravasation.  Because the left ureteral  calculus was somewhat difficult to extract from the ureter and required  manipulation, it was elected to place a double-J  catheter.  The patient was  given a B&O suppository at the beginning of the case and IV Toradol, and a 6  x 26 cm double-J catheter was placed without difficulty coiled in the renal  pelvis and in the bladder.  The patient was then awakened and taken to the  recovery room in good condition.                                                Sigmund I. Patsi Sears, M.D.    SIT/MEDQ  D:  07/09/2002  T:  07/09/2002  Job:  [id number]

## 2011-03-19 NOTE — Procedures (Signed)
[facility]  Patient:    Jordan Russell, Jordan Russell                  MRN: [id number] Proc. Date: 07/27/00 Adm. Date:  [id number] Attending:  Mingo Amber CC:         Yvette Rack. Dorna Bloom, M.D.   Procedure Report  PROCEDURE:  Video upper endoscopy.  INDICATIONS FOR PROCEDURE:  Reflux, vomiting and dysphagia in a 40 year old morbidly obese female.  PREPARATION:  She is n.p.o. since midnight.  PREPROCEDURE SEDATION:  She received 75 mg of Demerol and 6.5 mg of Versed intravenously. In addition, her throat was anesthetized with Hurricane spray and she was on 2 liters of nasal cannula O2.  DESCRIPTION OF PROCEDURE:  The Olympus video upper endoscope was inserted via the mouth and advanced easily through the upper esophageal sphincter. Intubation was then carried out without any notable stricture to the second duodenum. On withdrawal, the mucosa was carefully evaluated. The duodenum and bulb appeared normal as did the antrum and body of the stomach. Retroflexed view of the gastroesophageal junction did not demonstrate any significant hiatal hernia; however, the esophagus was somewhat shortened and the Z line was irregular from 36 to 34 cm from the esophagus. This was biopsied to rule out Barretts. There appeared to be mild inflammation of the distal esophagus. The remainder of the esophagus was normal. The patient tolerated the procedure well. Pulse, blood pressure and oximetry testing were stable throughout. She was observed in recovery for 1 hour and will be discharged if alert with a benign abdomen.  IMPRESSION:  Reflux esophagitis in a somewhat shortened esophagus. Rule out Barretts.  PLAN:  She is to continue the Aciphex twice daily and to this I am adding Reglan 10 mg before supper and at bedtime. This may however aggravate her occasional loose stool. Return to the office in 2-3 weeks for follow-up. DD:  07/27/00 TD:  07/28/00 Job: [id number]

## 2011-03-19 NOTE — Op Note (Signed)
NAME:  Jordan Russell, Jordan Russell                     ACCOUNT NO.:  [id number]   MEDICAL RECORD NO.:  [id number]                   PATIENT TYPE:  AMB   LOCATION:  [facility]                                  FACILITY:  [facility]   PHYSICIAN:  Dineen Kid. Rana Snare, M.D.                 DATE OF BIRTH:  1971/03/27   DATE OF PROCEDURE:  02/26/2003  DATE OF DISCHARGE:                                 OPERATIVE REPORT   PREOPERATIVE DIAGNOSES:  1. Pelvic pain.  2. Left ovarian cyst.  3. Desired sterilization.   POSTOPERATIVE DIAGNOSES:  1. Pelvic pain.  2. Left ovarian cyst.  3. Desired sterilization.   PROCEDURE:  1. Laparoscopic left ovarian cystectomy.  2. Bilateral tubal ligation.   SURGEON:  Dineen Kid. Rana Snare, M.D.   ANESTHESIA:  General endotracheal.   INDICATIONS:  The patient is a 40 year old G3, P1 with pelvic pain with left  ovarian cyst last measured on February 13, 2003 which measured 8.9 x 7.4 mm in  size with hemorrhagic component.  Her left-sided pain is interfering with  her day to day life and she wants further evaluation and treatment.  She  also desires sterilization.  Both her and her husband understand the risks  and benefits which include, but not limited to, risk of infection, bleeding,  damage to uterus, tubes, ovaries, bowel, or bladder, and failure rate of 5  out of 1000.  She does give her informed consent.   FINDINGS:  Left ovarian cyst.  Appeared to be serous in nature.  Otherwise,  normal appearing tubes, uterus, and cul-de-sac.   DESCRIPTION OF PROCEDURE:  After adequate analgesia the patient was placed  in the dorsal lithotomy position.  She is sterilely prepped and draped.  The  bladder was sterilely drained.  Graves speculum was placed and a Hulka  tenaculum was placed on the anterior lip of the cervix.  A 1 cm  infraumbilical skin incision was made.  A Veress needle was inserted.  Some  difficulty getting into the abdomen due to patient's size.  Small  pneumoperitoneum was  created on the right side of the abdomen.  An 11 mm  trocar was then inserted and the above findings were noted.  A 5 mm trocar  was inserted to the left of the midline under direct visualization.  A small  amount of bleeding from the trocar site was cauterized with bipolar cautery  with good hemostasis achieved.  The left ovary was grasped with atraumatic  graspers, was elevated.  Bovie cautery was used to cauterize a small hole in  the cyst wall.  Endo shears were used to excise a small portion of the cyst  wall.  Remaining portion of the cyst wall was shelled out using atraumatic  graspers and Endo shears and was removed and sent to pathology.  Remaining  portion of the ovary and the bed of the ovarian cyst was cauterized with  bipolar cautery.  Good hemostasis.  The ovary was returned to its normal  anatomic position.  The left fallopian tube was identified by the fimbriated  end.  A mid portion of tube was grasped with the bipolar cautery and  cauterized over approximately a 2-3 cm section of the tube.  Good thermal  burn noted throughout the entire tube and also loss of resistance on the  ohmmeter.  Right fallopian tube was identified by the fimbriated end.  Mid  portion of the tube was grasped and cauterized over 2-3 cm section of the  tube with good thermal burn and loss of resistance to the ohmmeter.  At this  point reexamination of the cul-de-sac revealed good hemostasis.  The abdomen  was then desufflated, the trocars removed.  The infraumbilical skin incision  was closed with a 0 Vicryl interrupted suture in the fascia and a 3-0 Vicryl  Rapide subcuticular suture in the skin.  The 5 mm trocar site was closed  with 3-0 Vicryl Rapide subcuticular suture and the incisions were  infiltrated with 0.25% Marcaine 10 mL total used.  Because of area in the  right lower abdomen with some distention from the pneumoperitoneum, small  incision was made in the right lower quadrant.  Veress  needle was inserted  just below the skin relieving a small amount of air from the  pneumoperitoneum.  This area was cleaned with Betadine and Band-Aid was  placed just over the incision.  The Hulka tenaculum was removed from the  anterior lip of the cervix, was noted to be hemostatic.  The patient was  stable on transfer to the recovery room.  The patient received 1 g of  Cefotetan preoperatively.   DISPOSITION:  The patient will be discharged home.  Will follow up in the  office in two to three weeks.  She is sent home with a routine instruction  sheet for laparoscopy and tubal ligation.  Also sent home with a  prescription for Darvocet number [id number].                                               Dineen Kid Rana Snare, M.D.    DCL/MEDQ  D:  02/26/2003  T:  02/26/2003  Job:  [id number]

## 2011-03-19 NOTE — Op Note (Signed)
NAMECAISLEY, BAXENDALE           ACCOUNT NO.:  [id number]   MEDICAL RECORD NO.:  [id number]          PATIENT TYPE:  OBV   LOCATION:  9399                          FACILITY:  [facility]   PHYSICIAN:  Dineen Kid. Rana Snare, M.D.    DATE OF BIRTH:  Oct 25, 1971   DATE OF PROCEDURE:  12/28/2005  DATE OF DISCHARGE:                                 OPERATIVE REPORT   PREOPERATIVE DIAGNOSES:  1.  Menometrorrhagia.  2.  Abnormal uterine bleeding.  3.  Pelvic pain.  4.  Dyspareunia.   POSTOPERATIVE DIAGNOSES:  1.  Menometrorrhagia.  2.  Abnormal uterine bleeding.  3.  Pelvic pain.  4.  Dyspareunia.   OPERATION/PROCEDURE:  Laparoscopic-assisted vaginal hysterectomy.   SURGEON:  Dineen Kid. Rana Snare, M.D.   ASSISTANT:  Zelphia Cairo, M.D.   ANESTHESIA:  General endotracheal anesthesia.   INDICATIONS:  Mrs. Wieser is a 40 year old gravida 3, para 1 with pelvic  pain, pressure, dyspareunia, and abnormal bleeding.  Has a small endometrial  mass on saline-infusion ultrasound suspicious for polyp.  However,  endometrial biopsy does return benign.  Because of the dyspareunia, she  desires definitive surgical intervention and requests hysterectomy with  preservation of at least one of her ovaries.  The risks and benefits were  discussed at length.  Informed consent was obtained.  See history and  physical for further details.   OPERATIVE FINDINGS:  Normal-appearing appendix, normal-appearing liver,  normal-appearing ovaries.  Uterus was grossly normal in appearance.   DESCRIPTION OF PROCEDURE:  After adequate anesthesia, the patient was placed  in the dorsal lithotomy position. She was prepped and draped and the bladder  sterilely drained.  Speculum was placed and tenaculum was placed on the  anterior lip of the cervix.  A 1 cm infraumbilical skin incision was made.  A Veress needle was inserted.  The abdomen was insufflated to dullness to  percussion.  A 5 mm trocar was inserted.  The above findings were  noted by  laparoscope.  A 5 mm trocar was inserted to the left of the midline, two  fingerbreadths above the pubic symphysis under direct visualization.  __________  cutting forceps was used coagulate and cut across the right  utero-ovarian ligament, down across the round ligament; similarly across the  left utero-ovarian ligament, down across the round ligament.  The bladder  was then elevated and a small window was made at the urethrovesical  junction.  Good hemostasis was achieved.  Abdomen was desufflated, legs were  repositioned.  Weighted speculum was placed in the vagina.  A posterior  colpotomy was performed.  Cervix was circumscribed with the Bovie cautery.  Anterior peritoneum was entered sharply and anterior vesical mucosa was  dissected off the anterior surface of the cervix.  After the anterior  peritoneum was entered, the retractors were placed underneath the bladder.  The LigaSure instrument was used to ligate across the uterosacral ligaments  bilaterally.  The cardinal ligaments and the bladder __________  bilaterally.  __________  dissection carried out with the Mayo scissors.  The inferior portion of the broad ligament were ligated with LigaSure,  dissected with Mayo  scissors.  The uterus was then removed.  The uterosacral  ligaments were suture ligated with 0 Monocryl figure-of-eight sutures.  Posterior peritoneum was then closed in a pursestring fashion with 0  Monocryl suture.  The vagina was then closed in a vertical fashion using  figure-of-eights of 0 Monocryl suture with good approximation and good  hemostasis achieved.  Foley catheter was placed and returned clear amniotic  urine.  The legs were repositioned, abdomen reinsufflated.  Irrigation was  applied after a copious amount of irrigation.  Adequate hemostasis was  assured.  Pedicles were reexamined, noted to be hemostatic.  Peritoneal  edges were touched up using bipolar cautery for good hemostasis.  The   ureters identified bilaterally and after a copious amount of irrigation and  adequate hemostasis, the abdomen was desufflated.  Trocars were removed.  The infraumbilical skin incision was closed with 0 Vicryl interrupted suture  in the fascia, 3-0 Vicryl Rapide subcuticular suture and the 5 mm site was  closed with 3-0 Vicryl Rapide subcuticular suture.  The incision was  injected with 0.25% Marcaine.  A total of 10 mL used.  The patient was  stable and transferred to the recovery room.  Sponge, needle and instrument  counts were normal x3.  Estimated blood loss was 150 mL.  The patient  received 400 mg of Cipro preoperatively.      Dineen Kid Rana Snare, M.D.  Electronically Signed     DCL/MEDQ  D:  12/28/2005  T:  12/28/2005  Job:  [id number]

## 2011-04-26 ENCOUNTER — Other Ambulatory Visit (HOSPITAL_COMMUNITY): Payer: Self-pay | Admitting: Rheumatology

## 2011-04-26 DIAGNOSIS — R748 Abnormal levels of other serum enzymes: Secondary | ICD-10-CM

## 2011-05-03 ENCOUNTER — Ambulatory Visit (HOSPITAL_COMMUNITY)
Admission: RE | Admit: 2011-05-03 | Discharge: 2011-05-03 | Disposition: A | Discharge status: Home / Self Care | Payer: BC Managed Care – PPO | Source: Ambulatory Visit | Attending: Rheumatology | Admitting: Rheumatology

## 2011-05-03 ENCOUNTER — Encounter (HOSPITAL_COMMUNITY)
Admission: RE | Admit: 2011-05-03 | Discharge: 2011-05-03 | Disposition: A | Discharge status: Home / Self Care | Payer: BC Managed Care – PPO | Source: Ambulatory Visit | Attending: Rheumatology | Admitting: Rheumatology

## 2011-05-03 DIAGNOSIS — R748 Abnormal levels of other serum enzymes: Secondary | ICD-10-CM | POA: Insufficient documentation

## 2011-05-03 DIAGNOSIS — M25559 Pain in unspecified hip: Secondary | ICD-10-CM | POA: Insufficient documentation

## 2011-05-03 MED ORDER — TECHNETIUM TC 99M MEDRONATE IV KIT
25.0000 | PACK | Freq: Once | INTRAVENOUS | Status: AC | PRN
  Administered 2011-05-03: 25 via INTRAVENOUS

## 2011-12-06 ENCOUNTER — Other Ambulatory Visit: Payer: Self-pay | Admitting: Gastroenterology

## 2011-12-06 DIAGNOSIS — R102 Pelvic and perineal pain: Secondary | ICD-10-CM

## 2011-12-07 ENCOUNTER — Ambulatory Visit
Admission: RE | Admit: 2011-12-07 | Discharge: 2011-12-07 | Disposition: A | Discharge status: Home / Self Care | Payer: BC Managed Care – PPO | Source: Ambulatory Visit | Attending: Gastroenterology | Admitting: Gastroenterology

## 2011-12-07 DIAGNOSIS — R102 Pelvic and perineal pain: Secondary | ICD-10-CM

## 2011-12-16 ENCOUNTER — Other Ambulatory Visit: Payer: Self-pay | Admitting: Gastroenterology

## 2013-12-14 DIAGNOSIS — Z9884 Bariatric surgery status: Secondary | ICD-10-CM | POA: Insufficient documentation

## 2013-12-14 DIAGNOSIS — Z9889 Other specified postprocedural states: Secondary | ICD-10-CM | POA: Insufficient documentation

## 2013-12-14 DIAGNOSIS — E559 Vitamin D deficiency, unspecified: Secondary | ICD-10-CM

## 2013-12-14 HISTORY — DX: Bariatric surgery status: Z98.84

## 2013-12-14 HISTORY — DX: Vitamin D deficiency, unspecified: E55.9

## 2014-02-05 DIAGNOSIS — M25562 Pain in left knee: Secondary | ICD-10-CM

## 2014-02-05 DIAGNOSIS — M25561 Pain in right knee: Secondary | ICD-10-CM | POA: Insufficient documentation

## 2014-02-05 DIAGNOSIS — M25569 Pain in unspecified knee: Secondary | ICD-10-CM | POA: Insufficient documentation

## 2014-02-05 HISTORY — DX: Pain in right knee: M25.561

## 2014-02-05 HISTORY — DX: Pain in right knee: M25.562

## 2014-02-20 DIAGNOSIS — M25569 Pain in unspecified knee: Secondary | ICD-10-CM | POA: Insufficient documentation

## 2014-02-26 DIAGNOSIS — M9420 Chondromalacia, unspecified site: Secondary | ICD-10-CM

## 2014-02-26 DIAGNOSIS — M94269 Chondromalacia, unspecified knee: Secondary | ICD-10-CM | POA: Insufficient documentation

## 2014-02-26 HISTORY — DX: Chondromalacia, unspecified site: M94.20

## 2014-04-16 DIAGNOSIS — Z9889 Other specified postprocedural states: Secondary | ICD-10-CM | POA: Insufficient documentation

## 2014-06-28 ENCOUNTER — Other Ambulatory Visit: Payer: Self-pay | Admitting: Obstetrics and Gynecology

## 2014-06-28 DIAGNOSIS — R234 Changes in skin texture: Secondary | ICD-10-CM

## 2014-07-03 ENCOUNTER — Ambulatory Visit
Admission: RE | Admit: 2014-07-03 | Discharge: 2014-07-03 | Disposition: A | Discharge status: Home / Self Care | Payer: BC Managed Care – PPO | Source: Ambulatory Visit | Attending: Obstetrics and Gynecology | Admitting: Obstetrics and Gynecology

## 2014-07-03 ENCOUNTER — Other Ambulatory Visit: Payer: Self-pay | Admitting: Obstetrics and Gynecology

## 2014-07-03 DIAGNOSIS — N6320 Unspecified lump in the left breast, unspecified quadrant: Secondary | ICD-10-CM

## 2014-07-03 DIAGNOSIS — R234 Changes in skin texture: Secondary | ICD-10-CM

## 2014-07-09 ENCOUNTER — Other Ambulatory Visit: Payer: Self-pay | Admitting: Obstetrics and Gynecology

## 2014-07-09 DIAGNOSIS — N6320 Unspecified lump in the left breast, unspecified quadrant: Secondary | ICD-10-CM

## 2014-07-10 ENCOUNTER — Ambulatory Visit
Admission: RE | Admit: 2014-07-10 | Discharge: 2014-07-10 | Disposition: A | Discharge status: Home / Self Care | Payer: BC Managed Care – PPO | Source: Ambulatory Visit | Attending: Obstetrics and Gynecology | Admitting: Obstetrics and Gynecology

## 2014-07-10 DIAGNOSIS — N6320 Unspecified lump in the left breast, unspecified quadrant: Secondary | ICD-10-CM

## 2014-07-11 ENCOUNTER — Other Ambulatory Visit: Payer: Self-pay | Admitting: Obstetrics and Gynecology

## 2014-07-12 LAB — CYTOLOGY - PAP

## 2014-08-03 DIAGNOSIS — M797 Fibromyalgia: Secondary | ICD-10-CM | POA: Insufficient documentation

## 2014-08-03 DIAGNOSIS — M069 Rheumatoid arthritis, unspecified: Secondary | ICD-10-CM | POA: Diagnosis present

## 2014-08-03 DIAGNOSIS — Z8639 Personal history of other endocrine, nutritional and metabolic disease: Secondary | ICD-10-CM

## 2014-08-03 DIAGNOSIS — L732 Hidradenitis suppurativa: Secondary | ICD-10-CM | POA: Insufficient documentation

## 2014-08-03 DIAGNOSIS — F319 Bipolar disorder, unspecified: Secondary | ICD-10-CM | POA: Diagnosis present

## 2014-08-03 DIAGNOSIS — Z862 Personal history of diseases of the blood and blood-forming organs and certain disorders involving the immune mechanism: Secondary | ICD-10-CM | POA: Insufficient documentation

## 2014-08-03 HISTORY — DX: Bipolar disorder, unspecified: F31.9

## 2014-08-03 HISTORY — DX: Hidradenitis suppurativa: L73.2

## 2014-08-03 HISTORY — DX: Personal history of other endocrine, nutritional and metabolic disease: Z86.39

## 2014-08-03 HISTORY — DX: Rheumatoid arthritis, unspecified: M06.9

## 2014-09-20 ENCOUNTER — Encounter (HOSPITAL_BASED_OUTPATIENT_CLINIC_OR_DEPARTMENT_OTHER): Payer: Self-pay | Admitting: *Deleted

## 2014-09-20 ENCOUNTER — Emergency Department (HOSPITAL_BASED_OUTPATIENT_CLINIC_OR_DEPARTMENT_OTHER)
Admission: EM | Admit: 2014-09-20 | Discharge: 2014-09-20 | Disposition: A | Discharge status: Home / Self Care | Payer: BC Managed Care – PPO | Attending: Emergency Medicine | Admitting: Emergency Medicine

## 2014-09-20 ENCOUNTER — Emergency Department (HOSPITAL_BASED_OUTPATIENT_CLINIC_OR_DEPARTMENT_OTHER): Payer: BC Managed Care – PPO

## 2014-09-20 DIAGNOSIS — Z720 Tobacco use: Secondary | ICD-10-CM | POA: Insufficient documentation

## 2014-09-20 DIAGNOSIS — R0602 Shortness of breath: Secondary | ICD-10-CM | POA: Diagnosis present

## 2014-09-20 DIAGNOSIS — J40 Bronchitis, not specified as acute or chronic: Secondary | ICD-10-CM | POA: Diagnosis not present

## 2014-09-20 DIAGNOSIS — R2240 Localized swelling, mass and lump, unspecified lower limb: Secondary | ICD-10-CM | POA: Insufficient documentation

## 2014-09-20 DIAGNOSIS — Z9884 Bariatric surgery status: Secondary | ICD-10-CM | POA: Insufficient documentation

## 2014-09-20 DIAGNOSIS — Z880 Allergy status to penicillin: Secondary | ICD-10-CM | POA: Insufficient documentation

## 2014-09-20 DIAGNOSIS — Z8701 Personal history of pneumonia (recurrent): Secondary | ICD-10-CM | POA: Diagnosis not present

## 2014-09-20 DIAGNOSIS — Z8719 Personal history of other diseases of the digestive system: Secondary | ICD-10-CM | POA: Insufficient documentation

## 2014-09-20 DIAGNOSIS — Z79899 Other long term (current) drug therapy: Secondary | ICD-10-CM | POA: Diagnosis not present

## 2014-09-20 DIAGNOSIS — Z8639 Personal history of other endocrine, nutritional and metabolic disease: Secondary | ICD-10-CM | POA: Insufficient documentation

## 2014-09-20 DIAGNOSIS — M1990 Unspecified osteoarthritis, unspecified site: Secondary | ICD-10-CM | POA: Diagnosis not present

## 2014-09-20 DIAGNOSIS — R05 Cough: Secondary | ICD-10-CM

## 2014-09-20 DIAGNOSIS — R059 Cough, unspecified: Secondary | ICD-10-CM

## 2014-09-20 HISTORY — DX: Fibromyalgia: M79.7

## 2014-09-20 HISTORY — DX: Pneumonia, unspecified organism: J18.9

## 2014-09-20 MED ORDER — ALBUTEROL SULFATE (2.5 MG/3ML) 0.083% IN NEBU: 2.5000 mg | INHALATION_SOLUTION | RESPIRATORY_TRACT | Status: DC | PRN

## 2014-09-20 MED ORDER — PREDNISONE 10 MG PO TABS: 20.0000 mg | ORAL_TABLET | Freq: Every day | ORAL | Status: DC

## 2014-09-20 MED ORDER — PREDNISONE 10 MG PO TABS
60.0000 mg | ORAL_TABLET | Freq: Once | ORAL | Status: AC
  Administered 2014-09-20: 60 mg via ORAL
  Filled 2014-09-20 (×2): qty 1

## 2014-09-20 MED ORDER — ALBUTEROL SULFATE (2.5 MG/3ML) 0.083% IN NEBU
5.0000 mg | INHALATION_SOLUTION | Freq: Once | RESPIRATORY_TRACT | Status: AC
  Administered 2014-09-20: 5 mg via RESPIRATORY_TRACT
  Filled 2014-09-20: qty 6

## 2014-09-20 MED ORDER — HYDROCODONE-HOMATROPINE 5-1.5 MG/5ML PO SYRP: 5.0000 mL | ORAL_SOLUTION | Freq: Four times a day (QID) | ORAL | Status: DC | PRN

## 2014-09-20 NOTE — ED Notes (Signed)
Patient states she has had body aches and cough for the last five days.  States for the last two days she has had sob.  Was seen at the [facility] five days ago and was started on doxycycline and cough meds.  States she felt worse today and was told to come to the ed for evaluation.

## 2014-09-20 NOTE — ED Provider Notes (Signed)
CSN: [id number]     Arrival date & time 09/20/14  1216 History   First MD Initiated Contact with Patient 09/20/14 1318     Chief Complaint  Patient presents with  . Shortness of Breath     (Consider location/radiation/quality/duration/timing/severity/associated sxs/prior Treatment) HPI Comments: Patient complains of cough and chest congestion. She states it started about 2 weeks ago with a sinus infection. [facility] 5 day course of doxycycline. She states it's now settled more in her chest. She's had a productive cough with yellow sputum. She's having some shortness of breath and tightness in her chest. She denies any leg pain or swelling. She denies any fevers or chills. She denies any nausea or vomiting. Her primary care physician called in a Z-Pak for her and she is on day 4 the Z-Pak with no improvement of symptoms. She has a remote history of asthma as a child. She is a everyday smoker.  Patient is a 43 y.o. female presenting with shortness of breath.  Shortness of Breath Associated symptoms: no abdominal pain, no chest pain, no cough, no diaphoresis, no fever, no headaches, no rash and no vomiting     Past Medical History  Diagnosis Date  . Arthritis   . Fibromyalgia   . Malnutrition   . Pneumonia   . Reflux    Past Surgical History  Procedure Laterality Date  . Gastric bypass    . Parathyroid exploration    . Cholecystectomy    . Knee arthroscopy    . Cystoscopy     No family history on file. History  Substance Use Topics  . Smoking status: Current Every Day Smoker  . Smokeless tobacco: Not on file  . Alcohol Use: No   OB History    No data available     Review of Systems  Constitutional: Positive for fatigue. Negative for fever, chills and diaphoresis.  HENT: Positive for congestion and rhinorrhea. Negative for sneezing.   Eyes: Negative.   Respiratory: Positive for chest tightness and shortness of breath. Negative for cough.    Cardiovascular: Positive for leg swelling (mild and at baseline). Negative for chest pain.  Gastrointestinal: Negative for nausea, vomiting, abdominal pain, diarrhea and blood in stool.  Genitourinary: Negative for frequency, hematuria, flank pain and difficulty urinating.  Musculoskeletal: Positive for myalgias. Negative for back pain and arthralgias.  Skin: Negative for rash.  Neurological: Negative for dizziness, speech difficulty, weakness, numbness and headaches.      Allergies  Morphine and related; Penicillins; and Sulfa antibiotics  Home Medications   Prior to Admission medications   Medication Sig Start Date End Date Taking? Authorizing Provider  albuterol (PROVENTIL) (2.5 MG/3ML) 0.083% nebulizer solution Take 3 mLs (2.5 mg total) by nebulization every 4 (four) hours as needed for wheezing or shortness of breath. 09/20/14   Malvin Johns, MD  HYDROcodone-homatropine (HYCODAN) 5-1.5 MG/5ML syrup Take 5 mLs by mouth every 6 (six) hours as needed for cough. 09/20/14   Malvin Johns, MD  predniSONE (DELTASONE) 10 MG tablet Take 2 tablets (20 mg total) by mouth daily. 09/20/14   Malvin Johns, MD   BP 128/49 mmHg  Pulse 81  Temp(Src) 98.7 F (37.1 C) (Oral)  Resp 20  SpO2 98% Physical Exam  Constitutional: She is oriented to person, place, and time. She appears well-developed and well-nourished.  HENT:  Head: Normocephalic and atraumatic.  Right Ear: External ear normal.  Left Ear: External ear normal.  Mouth/Throat: Oropharynx  is clear and moist.  Eyes: Pupils are equal, round, and reactive to light.  Neck: Normal range of motion. Neck supple.  Cardiovascular: Normal rate, regular rhythm and normal heart sounds.   Pulmonary/Chest: Effort normal. No respiratory distress. She has wheezes (Diffuse expiratory wheezing bilaterally). She has no rales. She exhibits no tenderness.  No increased work of breathing, talking in full sentences  Abdominal: Soft. Bowel sounds are  normal. There is no tenderness. There is no rebound and no guarding.  Musculoskeletal: Normal range of motion. She exhibits edema (Trace edema bilaterally, no calf tenderness).  Lymphadenopathy:    She has no cervical adenopathy.  Neurological: She is alert and oriented to person, place, and time.  Skin: Skin is warm and dry. No rash noted.  Psychiatric: She has a normal mood and affect.    ED Course  Procedures (including critical care time) Labs Review Labs Reviewed - No data to display  Imaging Review Dg Chest 2 View  09/20/2014   CLINICAL DATA:  Cough for 10 days.  History of smoking.  EXAM: CHEST  2 VIEW  COMPARISON:  01/17/2004  FINDINGS: The heart size and mediastinal contours are within normal limits. Both lungs are clear. The visualized skeletal structures are unremarkable.  IMPRESSION: No active cardiopulmonary disease.   Electronically Signed   By: Markus Daft M.D.   On: 09/20/2014 13:15     EKG Interpretation None      MDM   Final diagnoses:  Cough  Bronchitis    Patient has no evidence of pneumonia on chest x-ray. She was given albuterol nebulizer 2 in the ED. Her breathing symptoms have improved. She has improved air movement. She has no increased work of breathing. She has no hypoxia. She was discharged home in good condition with a prednisone burst as well as an albuterol inhaler. She actually artery has an albuterol inhaler that she got from urgent care but she says she has a nebulizer machine at home that is her sons. I gave her prescription for albuterol solution to use in this. I encouraged her to follow-up with her primary care physician if her symptoms are not improving or return here as needed for any worsening symptoms. She was also given prescription for Hycodan cough syrup.    Malvin Johns, MD 09/20/14 [phone]

## 2014-09-20 NOTE — Discharge Instructions (Signed)
Upper Respiratory Infection, Adult An upper respiratory infection (URI) is also known as the common cold. It is often caused by a type of germ (virus). Colds are easily spread (contagious). You can pass it to others by kissing, coughing, sneezing, or drinking out of the same glass. Usually, you get better in 1 or 2 weeks.  HOME CARE   Only take medicine as told by your doctor.  Use a warm mist humidifier or breathe in steam from a hot shower.  Drink enough water and fluids to keep your pee (urine) clear or pale yellow.  Get plenty of rest.  Return to work when your temperature is back to normal or as told by your doctor. You may use a face mask and wash your hands to stop your cold from spreading. GET HELP RIGHT AWAY IF:   After the first few days, you feel you are getting worse.  You have questions about your medicine.  You have chills, shortness of breath, or brown or red spit (mucus).  You have yellow or brown snot (nasal discharge) or pain in the face, especially when you bend forward.  You have a fever, puffy (swollen) neck, pain when you swallow, or white spots in the back of your throat.  You have a bad headache, ear pain, sinus pain, or chest pain.  You have a high-pitched whistling sound when you breathe in and out (wheezing).  You have a lasting cough or cough up blood.  You have sore muscles or a stiff neck. MAKE SURE YOU:   Understand these instructions.  Will watch your condition.  Will get help right away if you are not doing well or get worse. Document Released: 04/05/2008 Document Revised: 01/10/2012 Document Reviewed: 01/23/2014 Exit[employer] Patient Information 2015 [employer]. This information is not intended to replace advice given to you by your health care provider. Make sure you discuss any questions you have with your health care provider.  

## 2014-09-25 ENCOUNTER — Other Ambulatory Visit: Payer: Self-pay | Admitting: Gastroenterology

## 2014-09-30 NOTE — Anesthesia Preprocedure Evaluation (Addendum)
Anesthesia Evaluation  Patient identified by MRN, date of birth, ID band Patient awake    Reviewed: Allergy & Precautions, H&P , NPO status , Patient's Chart, lab work & pertinent test results  History of Anesthesia Complications Negative for: history of anesthetic complications  Airway Mallampati: II  TM Distance: >3 FB Neck ROM: Full    Dental no notable dental hx. (+) Dental Advisory Given   Pulmonary Current Smoker,  breath sounds clear to auscultation  Pulmonary exam normal       Cardiovascular Exercise Tolerance: Good negative cardio ROS  Rhythm:Regular Rate:Normal     Neuro/Psych PSYCHIATRIC DISORDERS Bipolar Disorder negative neurological ROS     GI/Hepatic Neg liver ROS, GERD-  Medicated and Controlled,  Endo/Other  negative endocrine ROS  Renal/GU negative Renal ROS  negative genitourinary   Musculoskeletal  (+) Arthritis -, Osteoarthritis,  Fibromyalgia -, narcotic dependent  Abdominal (+) + obese,   Peds negative pediatric ROS (+)  Hematology negative hematology ROS (+)   Anesthesia Other Findings   Reproductive/Obstetrics negative OB ROS                            Anesthesia Physical Anesthesia Plan  ASA: II  Anesthesia Plan: MAC   Post-op Pain Management:    Induction: Intravenous  Airway Management Planned: Nasal Cannula  Additional Equipment:   Intra-op Plan:   Post-operative Plan: Extubation in OR  Informed Consent: I have reviewed the patients History and Physical, chart, labs and discussed the procedure including the risks, benefits and alternatives for the proposed anesthesia with the patient or authorized representative who has indicated his/her understanding and acceptance.   Dental advisory given  Plan Discussed with: CRNA  Anesthesia Plan Comments:         Anesthesia Quick Evaluation

## 2014-10-01 ENCOUNTER — Encounter (HOSPITAL_COMMUNITY): Payer: Self-pay | Admitting: Anesthesiology

## 2014-10-01 ENCOUNTER — Ambulatory Visit (HOSPITAL_COMMUNITY)
Admission: RE | Admit: 2014-10-01 | Discharge: 2014-10-01 | Disposition: A | Discharge status: Home / Self Care | Payer: BC Managed Care – PPO | Source: Ambulatory Visit | Attending: Gastroenterology | Admitting: Gastroenterology

## 2014-10-01 ENCOUNTER — Ambulatory Visit (HOSPITAL_COMMUNITY): Payer: BC Managed Care – PPO | Admitting: Anesthesiology

## 2014-10-01 ENCOUNTER — Encounter (HOSPITAL_COMMUNITY)
Admission: RE | Disposition: A | Discharge status: Home / Self Care | Payer: Self-pay | Source: Ambulatory Visit | Attending: Gastroenterology

## 2014-10-01 DIAGNOSIS — K219 Gastro-esophageal reflux disease without esophagitis: Secondary | ICD-10-CM | POA: Insufficient documentation

## 2014-10-01 DIAGNOSIS — Z9884 Bariatric surgery status: Secondary | ICD-10-CM | POA: Diagnosis not present

## 2014-10-01 DIAGNOSIS — F17210 Nicotine dependence, cigarettes, uncomplicated: Secondary | ICD-10-CM | POA: Insufficient documentation

## 2014-10-01 DIAGNOSIS — F319 Bipolar disorder, unspecified: Secondary | ICD-10-CM | POA: Insufficient documentation

## 2014-10-01 DIAGNOSIS — Z6841 Body Mass Index (BMI) 40.0 and over, adult: Secondary | ICD-10-CM | POA: Insufficient documentation

## 2014-10-01 DIAGNOSIS — E669 Obesity, unspecified: Secondary | ICD-10-CM | POA: Insufficient documentation

## 2014-10-01 DIAGNOSIS — K222 Esophageal obstruction: Secondary | ICD-10-CM | POA: Diagnosis not present

## 2014-10-01 DIAGNOSIS — R1319 Other dysphagia: Secondary | ICD-10-CM | POA: Diagnosis present

## 2014-10-01 DIAGNOSIS — Z880 Allergy status to penicillin: Secondary | ICD-10-CM | POA: Insufficient documentation

## 2014-10-01 DIAGNOSIS — M797 Fibromyalgia: Secondary | ICD-10-CM | POA: Diagnosis not present

## 2014-10-01 DIAGNOSIS — Z882 Allergy status to sulfonamides status: Secondary | ICD-10-CM | POA: Insufficient documentation

## 2014-10-01 DIAGNOSIS — F1120 Opioid dependence, uncomplicated: Secondary | ICD-10-CM | POA: Insufficient documentation

## 2014-10-01 DIAGNOSIS — E213 Hyperparathyroidism, unspecified: Secondary | ICD-10-CM | POA: Insufficient documentation

## 2014-10-01 DIAGNOSIS — M1990 Unspecified osteoarthritis, unspecified site: Secondary | ICD-10-CM | POA: Insufficient documentation

## 2014-10-01 DIAGNOSIS — Z881 Allergy status to other antibiotic agents status: Secondary | ICD-10-CM | POA: Insufficient documentation

## 2014-10-01 HISTORY — PX: ESOPHAGOGASTRODUODENOSCOPY (EGD) WITH PROPOFOL: SHX5813

## 2014-10-01 HISTORY — PX: BALLOON DILATION: SHX5330

## 2014-10-01 LAB — GLUCOSE, CAPILLARY: Glucose-Capillary: 88 mg/dL (ref 70–99)

## 2014-10-01 SURGERY — ESOPHAGOGASTRODUODENOSCOPY (EGD) WITH PROPOFOL
Anesthesia: Monitor Anesthesia Care

## 2014-10-01 MED ORDER — GLYCOPYRROLATE 0.2 MG/ML IJ SOLN
INTRAMUSCULAR | Status: AC
  Filled 2014-10-01: qty 1

## 2014-10-01 MED ORDER — MIDAZOLAM HCL 5 MG/5ML IJ SOLN
INTRAMUSCULAR | Status: DC | PRN
  Administered 2014-10-01 (×2): 1 mg via INTRAVENOUS

## 2014-10-01 MED ORDER — PROPOFOL 10 MG/ML IV BOLUS
INTRAVENOUS | Status: AC
  Filled 2014-10-01: qty 20

## 2014-10-01 MED ORDER — MIDAZOLAM HCL 2 MG/2ML IJ SOLN
INTRAMUSCULAR | Status: AC
  Filled 2014-10-01: qty 2

## 2014-10-01 MED ORDER — BUTAMBEN-TETRACAINE-BENZOCAINE 2-2-14 % EX AERO
INHALATION_SPRAY | CUTANEOUS | Status: DC | PRN
  Administered 2014-10-01: 1 via TOPICAL

## 2014-10-01 MED ORDER — PROPOFOL INFUSION 10 MG/ML OPTIME
INTRAVENOUS | Status: DC | PRN
  Administered 2014-10-01: 140 ug/kg/min via INTRAVENOUS

## 2014-10-01 MED ORDER — LIDOCAINE HCL (CARDIAC) 20 MG/ML IV SOLN
INTRAVENOUS | Status: AC
Start: 2014-10-01 — End: 2014-10-01
  Filled 2014-10-01: qty 5

## 2014-10-01 MED ORDER — LACTATED RINGERS IV SOLN
INTRAVENOUS | Status: DC
  Administered 2014-10-01: 07:00:00 via INTRAVENOUS

## 2014-10-01 MED ORDER — SODIUM CHLORIDE 0.9 % IV SOLN: INTRAVENOUS | Status: DC

## 2014-10-01 MED ORDER — LIDOCAINE HCL (CARDIAC) 20 MG/ML IV SOLN
INTRAVENOUS | Status: DC | PRN
  Administered 2014-10-01: 100 mg via INTRAVENOUS

## 2014-10-01 MED ORDER — FENTANYL CITRATE 0.05 MG/ML IJ SOLN
INTRAMUSCULAR | Status: AC
  Filled 2014-10-01: qty 2

## 2014-10-01 MED ORDER — GLYCOPYRROLATE 0.2 MG/ML IJ SOLN
INTRAMUSCULAR | Status: DC | PRN
  Administered 2014-10-01: .2 mg via INTRAVENOUS

## 2014-10-01 MED ORDER — FENTANYL CITRATE 0.05 MG/ML IJ SOLN
INTRAMUSCULAR | Status: DC | PRN
  Administered 2014-10-01 (×2): 50 ug via INTRAVENOUS

## 2014-10-01 SURGICAL SUPPLY — 14 items

## 2014-10-01 NOTE — Transfer of Care (Signed)
Immediate Anesthesia Transfer of Care Note  Patient: Jordan Russell  Procedure(s) Performed: Procedure(s): ESOPHAGOGASTRODUODENOSCOPY (EGD) WITH PROPOFOL (N/A) BALLOON DILATION (N/A)  Patient Location: PACU  Anesthesia Type:MAC  Level of Consciousness: awake, alert  and oriented  Airway & Oxygen Therapy: Patient Spontanous Breathing and Patient connected to nasal cannula oxygen  Post-op Assessment: Report given to PACU RN and Post -op Vital signs reviewed and stable  Post vital signs: Reviewed and stable  Complications: No apparent anesthesia complications

## 2014-10-01 NOTE — Discharge Instructions (Signed)
Esophagogastroduodenoscopy °Care After °Refer to this sheet in the next few weeks. These instructions provide you with information on caring for yourself after your procedure. Your caregiver may also give you more specific instructions. Your treatment has been planned according to current medical practices, but problems sometimes occur. Call your caregiver if you have any problems or questions after your procedure.  °HOME CARE INSTRUCTIONS °· Do not eat or drink anything until the numbing medicine (local anesthetic) has worn off and your gag reflex has returned. You will know that the local anesthetic has worn off when you can swallow comfortably. °· Do not drive for 12 hours after the procedure or as directed by your caregiver. °· Only take medicines as directed by your caregiver. °SEEK MEDICAL CARE IF:  °· You cannot stop coughing. °· You are not urinating at all or less than usual. °SEEK IMMEDIATE MEDICAL CARE IF: °· You have difficulty swallowing. °· You cannot eat or drink. °· You have worsening throat or chest pain. °· You have dizziness, lightheadedness, or you faint. °· You have nausea or vomiting. °· You have chills. °· You have a fever. °· You have severe abdominal pain. °· You have black, tarry, or bloody stools. °Document Released: 10/04/2012 Document Reviewed: 10/04/2012 °ExitCare® Patient Information ©2015 [employer]. This information is not intended to replace advice given to you by your health care provider. Make sure you discuss any questions you have with your health care provider. ° °

## 2014-10-01 NOTE — Anesthesia Postprocedure Evaluation (Signed)
  Anesthesia Post-op Note  Patient: Jordan Russell  Procedure(s) Performed: Procedure(s) (LRB): ESOPHAGOGASTRODUODENOSCOPY (EGD) WITH PROPOFOL (N/A) BALLOON DILATION (N/A)  Patient Location: PACU  Anesthesia Type: MAC  Level of Consciousness: awake and alert   Airway and Oxygen Therapy: Patient Spontanous Breathing  Post-op Pain: mild  Post-op Assessment: Post-op Vital signs reviewed, Patient's Cardiovascular Status Stable, Respiratory Function Stable, Patent Airway and No signs of Nausea or vomiting  Last Vitals:  Filed Vitals:   10/01/14 0820  BP: 127/61  Pulse: 78  Temp:   Resp: 19    Post-op Vital Signs: stable   Complications: No apparent anesthesia complications

## 2014-10-01 NOTE — Op Note (Signed)
Problem: Esophageal dysphagia. Chronic gastroesophageal reflux post gastric bypass surgery performed in 2001  Endoscopist: Earle Gell  Premedication: Propofol administered by anesthesia  Procedure: Diagnostic esophagogastroduodenoscopy The patient was placed in the left lateral decubitus position. The Pentax gastroscope was passed through the posterior hypopharynx into the proximal esophagus without difficulty. The vocal cords were not visualized.  Esophagoscopy: The proximal, mid, and lower segments of the esophageal mucosa appeared completely normal. The squamocolumnar junction was noted at 35 cm from the incisor teeth and appeared regular. There was no endoscopic evidence for the presence of erosive esophagitis, Barrett's esophagus, or esophageal stricture formation. Using the esophageal balloon dilator, the balloon was inflated from 15 mm to 18 mm at the esophagogastric junction associated with no mucosal disruption indicating the absence esophageal stricture formation. Five biopsies were obtained along the length of the esophagus to look for eosinophilic esophagitis. The patient currently takes omeprazole twice daily.  Gastroscopy: The patient has a very small gastric pouch which appeared normal.  Enteroscopy: The small bowel appeared normal.  Assessment: Normal esophagogastroduodenoscopy post Roux-en-Y gastric bypass surgery performed in 2001. Esophageal biopsies to look for eosinophilic esophagitis pending. No signs of Barrett's esophagus, esophageal stricture formation, or erosive esophagitis.  Recommendation: If esophageal biopsies do not show eosinophilic esophagitis, consider scheduling esophageal manometry to look for esophageal dysmotility as a cause for esophageal dysphagia.

## 2014-10-01 NOTE — H&P (Signed)
  Problem: Esophageal dysphagia. Remote gastric bypass surgery. Gastroesophageal reflux.  History: The patient is a 43 year old female born 1971/07/07. She has undergone gastric bypass surgery and her weight is down 120 pounds. She takes proton pump inhibitor therapy twice daily to control chronic gastroesophageal reflux.  The patient has developed intermittent solid food esophageal dysphagia associated with chest discomfort when a food bolus sticks in her esophagus. She localizes the esophageal obstruction to the mid retrosternal area. She denies odynophagia.  Due to a strong gag reflex, the patient did not think she could tolerate a barium esophagram.  The patient is scheduled to undergo diagnostic esophagogastroduodenoscopy with esophageal dilation.  Medication allergies: Penicillin. Morphine. Sulfa drugs.  Past medical history: Bipolar disorder. Arthritis. Kidney stones. Hyperparathyroidism. Parathyroid surgery. Fibromyalgia syndrome. Gastroesophageal reflux. Gastric bypass surgery. Hysterectomy. Vaginal sling.  Exam: The patient is alert and lying comfortably on the endoscopy stretcher. Lungs are clear to auscultation. Cardiac exam reveals a regular rhythm. Abdomen is soft and nontender to palpation.  Plan: Proceed with diagnostic esophagogastroduodenoscopy with esophageal dilation

## 2014-10-02 ENCOUNTER — Encounter (HOSPITAL_COMMUNITY): Payer: Self-pay | Admitting: Gastroenterology

## 2014-10-28 ENCOUNTER — Encounter (HOSPITAL_COMMUNITY): Admission: RE | Payer: Self-pay | Source: Ambulatory Visit

## 2014-10-28 ENCOUNTER — Ambulatory Visit (HOSPITAL_COMMUNITY)
Admission: RE | Admit: 2014-10-28 | Payer: BC Managed Care – PPO | Source: Ambulatory Visit | Admitting: Gastroenterology

## 2014-10-28 SURGERY — MANOMETRY, ESOPHAGUS

## 2014-12-02 ENCOUNTER — Other Ambulatory Visit: Payer: Self-pay | Admitting: Gastroenterology

## 2014-12-23 ENCOUNTER — Ambulatory Visit (HOSPITAL_COMMUNITY)
Admission: RE | Admit: 2014-12-23 | Discharge: 2014-12-23 | Disposition: A | Discharge status: Home / Self Care | Payer: BLUE CROSS/BLUE SHIELD | Source: Ambulatory Visit | Attending: Gastroenterology | Admitting: Gastroenterology

## 2014-12-23 ENCOUNTER — Encounter (HOSPITAL_COMMUNITY)
Admission: RE | Disposition: A | Discharge status: Home / Self Care | Payer: Self-pay | Source: Ambulatory Visit | Attending: Gastroenterology

## 2014-12-23 DIAGNOSIS — Z9884 Bariatric surgery status: Secondary | ICD-10-CM | POA: Diagnosis not present

## 2014-12-23 DIAGNOSIS — R1310 Dysphagia, unspecified: Secondary | ICD-10-CM | POA: Diagnosis not present

## 2014-12-23 HISTORY — PX: ESOPHAGEAL MANOMETRY: SHX5429

## 2014-12-23 SURGERY — MANOMETRY, ESOPHAGUS

## 2014-12-23 MED ORDER — LIDOCAINE VISCOUS 2 % MT SOLN
OROMUCOSAL | Status: AC
  Filled 2014-12-23: qty 15

## 2014-12-23 SURGICAL SUPPLY — 2 items
FACESHIELD LNG OPTICON STERILE (SAFETY) IMPLANT
GLOVE BIO SURGEON STRL SZ8 (GLOVE) ×4 IMPLANT

## 2014-12-23 NOTE — H&P (Signed)
  Problem: Dysphagia post gastric bypass surgery: High-resolution esophageal manometry findings  History: The patient is a 44 year old female born May 28, 1971. She  underwent Roux-en-Y gastric bypass surgery in 2001. She underwent a normal diagnostic esophagogastroduodenoscopy on 10/01/2014 post gastric bypass surgery.. Esophageal biopsies did not show eosinophilic esophagitis.  The patient is scheduled to undergo high-resolution esophageal manometry.  Past medical history: Bipolar disorder. Kidney stones. Primary hyperparathyroidism surgery. Fibromyalgia syndrome. Gastric bypass surgery in 2001. Hysterectomy. Vaginal Cel[place].  Medication allergies: Penicillin. Morphine. Sulfa drugs.  High-resolution esophageal manometry findings:  #1. Normal basal upper esophageal sphincter pressure with normal relaxation  #2. Low basal lower esophageal sphincter pressure (9 mmHg) with normal relaxation  #3. Weak, ineffective peristaltic esophageal body contractions.

## 2014-12-24 ENCOUNTER — Encounter (HOSPITAL_COMMUNITY): Payer: Self-pay | Admitting: Gastroenterology

## 2015-02-10 DIAGNOSIS — R1314 Dysphagia, pharyngoesophageal phase: Secondary | ICD-10-CM | POA: Insufficient documentation

## 2015-06-04 DIAGNOSIS — R1013 Epigastric pain: Secondary | ICD-10-CM | POA: Insufficient documentation

## 2015-06-04 HISTORY — DX: Epigastric pain: R10.13

## 2015-12-03 ENCOUNTER — Encounter (HOSPITAL_COMMUNITY): Payer: Self-pay

## 2015-12-03 ENCOUNTER — Emergency Department (HOSPITAL_COMMUNITY)
Admission: EM | Admit: 2015-12-03 | Discharge: 2015-12-03 | Disposition: A | Discharge status: Home / Self Care | Payer: 59 | Attending: Physician Assistant | Admitting: Physician Assistant

## 2015-12-03 ENCOUNTER — Emergency Department (HOSPITAL_COMMUNITY): Payer: 59

## 2015-12-03 DIAGNOSIS — Z9049 Acquired absence of other specified parts of digestive tract: Secondary | ICD-10-CM | POA: Insufficient documentation

## 2015-12-03 DIAGNOSIS — Z90710 Acquired absence of both cervix and uterus: Secondary | ICD-10-CM | POA: Diagnosis not present

## 2015-12-03 DIAGNOSIS — R638 Other symptoms and signs concerning food and fluid intake: Secondary | ICD-10-CM | POA: Insufficient documentation

## 2015-12-03 DIAGNOSIS — M069 Rheumatoid arthritis, unspecified: Secondary | ICD-10-CM | POA: Insufficient documentation

## 2015-12-03 DIAGNOSIS — K219 Gastro-esophageal reflux disease without esophagitis: Secondary | ICD-10-CM | POA: Diagnosis not present

## 2015-12-03 DIAGNOSIS — Z3202 Encounter for pregnancy test, result negative: Secondary | ICD-10-CM | POA: Diagnosis not present

## 2015-12-03 DIAGNOSIS — Z793 Long term (current) use of hormonal contraceptives: Secondary | ICD-10-CM | POA: Diagnosis not present

## 2015-12-03 DIAGNOSIS — Z9884 Bariatric surgery status: Secondary | ICD-10-CM | POA: Insufficient documentation

## 2015-12-03 DIAGNOSIS — G8929 Other chronic pain: Secondary | ICD-10-CM | POA: Diagnosis not present

## 2015-12-03 DIAGNOSIS — Z9889 Other specified postprocedural states: Secondary | ICD-10-CM | POA: Insufficient documentation

## 2015-12-03 DIAGNOSIS — Z7952 Long term (current) use of systemic steroids: Secondary | ICD-10-CM | POA: Insufficient documentation

## 2015-12-03 DIAGNOSIS — R197 Diarrhea, unspecified: Secondary | ICD-10-CM | POA: Insufficient documentation

## 2015-12-03 DIAGNOSIS — Z880 Allergy status to penicillin: Secondary | ICD-10-CM | POA: Diagnosis not present

## 2015-12-03 DIAGNOSIS — F17210 Nicotine dependence, cigarettes, uncomplicated: Secondary | ICD-10-CM | POA: Insufficient documentation

## 2015-12-03 DIAGNOSIS — Z8639 Personal history of other endocrine, nutritional and metabolic disease: Secondary | ICD-10-CM | POA: Insufficient documentation

## 2015-12-03 DIAGNOSIS — R109 Unspecified abdominal pain: Secondary | ICD-10-CM | POA: Diagnosis present

## 2015-12-03 DIAGNOSIS — Z8701 Personal history of pneumonia (recurrent): Secondary | ICD-10-CM | POA: Insufficient documentation

## 2015-12-03 DIAGNOSIS — Z79899 Other long term (current) drug therapy: Secondary | ICD-10-CM | POA: Diagnosis not present

## 2015-12-03 DIAGNOSIS — K5900 Constipation, unspecified: Secondary | ICD-10-CM | POA: Insufficient documentation

## 2015-12-03 DIAGNOSIS — F319 Bipolar disorder, unspecified: Secondary | ICD-10-CM | POA: Insufficient documentation

## 2015-12-03 HISTORY — DX: Rheumatoid arthritis, unspecified: M06.9

## 2015-12-03 LAB — CBC
HCT: 42.2 % (ref 36.0–46.0)
Hemoglobin: 13.4 g/dL (ref 12.0–15.0)
MCH: 30.8 pg (ref 26.0–34.0)
MCHC: 31.8 g/dL (ref 30.0–36.0)
MCV: 97 fL (ref 78.0–100.0)
PLATELETS: 349 10*3/uL (ref 150–400)
RBC: 4.35 MIL/uL (ref 3.87–5.11)
RDW: 17.1 % — AB (ref 11.5–15.5)
WBC: 10.4 10*3/uL (ref 4.0–10.5)

## 2015-12-03 LAB — COMPREHENSIVE METABOLIC PANEL
ALK PHOS: 127 U/L — AB (ref 38–126)
ALT: 22 U/L (ref 14–54)
AST: 22 U/L (ref 15–41)
Albumin: 3.8 g/dL (ref 3.5–5.0)
Anion gap: 8 (ref 5–15)
BUN: 10 mg/dL (ref 6–20)
CALCIUM: 9 mg/dL (ref 8.9–10.3)
CO2: 28 mmol/L (ref 22–32)
CREATININE: 0.66 mg/dL (ref 0.44–1.00)
Chloride: 106 mmol/L (ref 101–111)
GFR calc non Af Amer: >60 mL/min (ref >60)
Glucose, Bld: 100 mg/dL — ABNORMAL HIGH (ref 65–99)
Potassium: 3.8 mmol/L (ref 3.5–5.1)
SODIUM: 142 mmol/L (ref 135–145)
Total Bilirubin: 0.4 mg/dL (ref 0.3–1.2)
Total Protein: 6 g/dL — ABNORMAL LOW (ref 6.5–8.1)

## 2015-12-03 LAB — URINALYSIS, ROUTINE W REFLEX MICROSCOPIC
BILIRUBIN URINE: NEGATIVE
Glucose, UA: NEGATIVE mg/dL
KETONES UR: NEGATIVE mg/dL
Leukocytes, UA: NEGATIVE
Nitrite: NEGATIVE
PROTEIN: NEGATIVE mg/dL
Specific Gravity, Urine: 1.009 (ref 1.005–1.030)
pH: 7 (ref 5.0–8.0)

## 2015-12-03 LAB — URINE MICROSCOPIC-ADD ON

## 2015-12-03 LAB — PREGNANCY, URINE: PREG TEST UR: NEGATIVE

## 2015-12-03 LAB — LIPASE, BLOOD: Lipase: 22 U/L (ref 11–51)

## 2015-12-03 MED ORDER — PEG 3350-KCL-NA BICARB-NACL 420 G PO SOLR: 4000.0000 mL | Freq: Once | ORAL | Status: DC

## 2015-12-03 MED ORDER — IOHEXOL 300 MG/ML  SOLN: 25.0000 mL | Freq: Once | INTRAMUSCULAR | Status: DC | PRN

## 2015-12-03 NOTE — ED Notes (Signed)
Pt c/o intermittent emesis x 2 years, generalized abdominal pain x 1 year and constipation since 12/24.  Pain score 10/10.  Hx of gastric bypass x 15 years ago.  Pt followed Wynetta Emery MD for GI complaints.

## 2015-12-03 NOTE — Discharge Instructions (Signed)
Drink 5 glasses a day until you defecate. Call Dr. Wynetta Emery as needed  Constipation, Adult Constipation is when a person has fewer than three bowel movements a week, has difficulty having a bowel movement, or has stools that are dry, hard, or larger than normal. As people grow older, constipation is more common. A low-fiber diet, not taking in enough fluids, and taking certain medicines may make constipation worse.  CAUSES   Certain medicines, such as antidepressants, pain medicine, iron supplements, antacids, and water pills.   Certain diseases, such as diabetes, irritable bowel syndrome (IBS), thyroid disease, or depression.   Not drinking enough water.   Not eating enough fiber-rich foods.   Stress or travel.   Lack of physical activity or exercise.   Ignoring the urge to have a bowel movement.   Using laxatives too much.  SIGNS AND SYMPTOMS   Having fewer than three bowel movements a week.   Straining to have a bowel movement.   Having stools that are hard, dry, or larger than normal.   Feeling full or bloated.   Pain in the lower abdomen.   Not feeling relief after having a bowel movement.  DIAGNOSIS  Your health care provider will take a medical history and perform a physical exam. Further testing may be done for severe constipation. Some tests may include:  A barium enema X-ray to examine your rectum, colon, and, sometimes, your small intestine.   A sigmoidoscopy to examine your lower colon.   A colonoscopy to examine your entire colon. TREATMENT  Treatment will depend on the severity of your constipation and what is causing it. Some dietary treatments include drinking more fluids and eating more fiber-rich foods. Lifestyle treatments may include regular exercise. If these diet and lifestyle recommendations do not help, your health care provider may recommend taking over-the-counter laxative medicines to help you have bowel movements. Prescription  medicines may be prescribed if over-the-counter medicines do not work.  HOME CARE INSTRUCTIONS   Eat foods that have a lot of fiber, such as fruits, vegetables, whole grains, and beans.  Limit foods high in fat and processed sugars, such as french fries, hamburgers, cookies, candies, and soda.   A fiber supplement may be added to your diet if you cannot get enough fiber from foods.   Drink enough fluids to keep your urine clear or pale yellow.   Exercise regularly or as directed by your health care provider.   Go to the restroom when you have the urge to go. Do not hold it.   Only take over-the-counter or prescription medicines as directed by your health care provider. Do not take other medicines for constipation without talking to your health care provider first.  [place] IF:   You have bright red blood in your stool.   Your constipation lasts for more than 4 days or gets worse.   You have abdominal or rectal pain.   You have thin, pencil-like stools.   You have unexplained weight loss. MAKE SURE YOU:   Understand these instructions.  Will watch your condition.  Will get help right away if you are not doing well or get worse.   This information is not intended to replace advice given to you by your health care provider. Make sure you discuss any questions you have with your health care provider.   Document Released: 07/16/2004 Document Revised: 11/08/2014 Document Reviewed: 07/30/2013 Elsevier Interactive Patient Education [employer].

## 2015-12-03 NOTE — ED Provider Notes (Signed)
CSN: [id number]     Arrival date & time 12/03/15  1031 History   First MD Initiated Contact with Patient 12/03/15 1218     Chief Complaint  Patient presents with  . Abdominal Pain  . Constipation     (Consider location/radiation/quality/duration/timing/severity/associated sxs/prior Treatment) HPI  Patient is a 45 year old female with history of bipolar, rheumatoid, multiple medical issues related to gastrointestinal problems. Patient had gastric bypass 10 years ago. She said the first 6 years were fine. The last 2 years have been difficult for patient. Patient has multiple complaints that have been chronic over the last 2 years including difficulty eating,chronic pain, gas, constipation, diarrhea, gastroparesis-like symptoms. Patient is followed by [facility], Dr. Wynetta Emery.  She went to Dr. Durenda Age office today and was sent here. She said she was sent here for help being able to get rid of her constipation. However in his note he states he would like a CAT scan done to rule out obstruction.   Patient states that she's done everything to try rid of herself of constipation including MiraLAX and docusate and food changes.   Past Medical History  Diagnosis Date  . Arthritis   . Malnutrition ([place])   . Pneumonia   . Reflux   . RA (rheumatoid arthritis) ([place])   . Bipolar disorder [facility])    Past Surgical History  Procedure Laterality Date  . Gastric bypass    . Parathyroid exploration    . Cholecystectomy    . Knee arthroscopy    . Cystoscopy    . Abdominal hysterectomy    . Vaginal sling    . Esophagogastroduodenoscopy (egd) with propofol N/A 10/01/2014    Procedure: ESOPHAGOGASTRODUODENOSCOPY (EGD) WITH PROPOFOL;  Surgeon: Garlan Fair, MD;  Location: [facility];  Service: Endoscopy;  Laterality: N/A;  . Balloon dilation N/A 10/01/2014    Procedure: BALLOON DILATION;  Surgeon: Garlan Fair, MD;  Location: [facility];  Service: Endoscopy;  Laterality: N/A;  . Esophageal  manometry N/A 12/23/2014    Procedure: ESOPHAGEAL MANOMETRY (EM);  Surgeon: Garlan Fair, MD;  Location: [facility];  Service: Endoscopy;  Laterality: N/A;   History reviewed. No pertinent family history. Social History  Substance Use Topics  . Smoking status: Current Every Day Smoker -- 0.50 packs/day    Types: Cigarettes  . Smokeless tobacco: None  . Alcohol Use: Yes     Comment: occ   OB History    No data available     Review of Systems  Constitutional: Negative for fever and activity change.  Respiratory: Negative for shortness of breath.   Cardiovascular: Negative for chest pain.  Gastrointestinal: Positive for nausea, abdominal pain and constipation. Negative for vomiting and diarrhea.  Genitourinary: Negative for dysuria.  Neurological: Negative for dizziness.      Allergies  Morphine and related; Penicillins; and Sulfa antibiotics  Home Medications   Prior to Admission medications   Medication Sig Start Date End Date Taking? Authorizing Provider  acetaminophen (TYLENOL) 500 MG tablet Take 1,000 mg by mouth every 6 (six) hours as needed for moderate pain.    Yes Historical Provider, MD  ALPRAZolam Duanne Moron) 1 MG tablet Take 1 mg by mouth 4 (four) times daily as needed for anxiety.   Yes Historical Provider, MD  citalopram (CELEXA) 20 MG tablet Take 20 mg by mouth at bedtime.   Yes Historical Provider, MD  estradiol (ESTRACE) 2 MG tablet Take 2 mg by mouth at bedtime.   Yes Historical Provider, MD  lamoTRIgine (LAMICTAL) 100 MG tablet Take 100 mg by mouth at bedtime.   Yes Historical Provider, MD  lamoTRIgine (LAMICTAL) 200 MG tablet Take 200 mg by mouth daily as needed (bipolar disorder).   Yes Historical Provider, MD  moxifloxacin (VIGAMOX) 0.5 % ophthalmic solution 1 drop in both eyes bid 11/10/14  Yes Historical Provider, MD  omeprazole (PRILOSEC) 20 MG capsule Take 20 mg by mouth 2 (two) times daily.   Yes Historical Provider, MD  predniSONE (STERAPRED UNI-PAK  48 TAB) 5 MG (48) TBPK tablet USE AS DIRECTED ONCE A DAY AS DIRECTED ORALLY 12 DAYS 11/25/15  Yes Historical Provider, MD  QUEtiapine (SEROQUEL) 300 MG tablet Take 300 mg by mouth at bedtime.    Yes Historical Provider, MD  topiramate (TOPAMAX) 25 MG capsule Take 25 mg by mouth daily as needed (migraines).  12/10/14  Yes Historical Provider, MD  traMADol (ULTRAM) 50 MG tablet Take 50 mg by mouth every 8 (eight) hours as needed for moderate pain or severe pain.  10/09/15  Yes Historical Provider, MD  albuterol (PROVENTIL) (2.5 MG/3ML) 0.083% nebulizer solution Take 3 mLs (2.5 mg total) by nebulization every 4 (four) hours as needed for wheezing or shortness of breath. 09/20/14   Malvin Johns, MD  cyclobenzaprine (FLEXERIL) 10 MG tablet TAKE ONE TABLET (10 MG TOTAL) BY MOUTH EVERY 8 (EIGHT) HOURS AS NEEDED FOR MUSCLE SPASMS. 10/30/15   Historical Provider, MD  HYDROcodone-homatropine (HYCODAN) 5-1.5 MG/5ML syrup Take 5 mLs by mouth every 6 (six) hours as needed for cough. Patient not taking: Reported on 10/01/2014 09/20/14   Malvin Johns, MD  predniSONE (DELTASONE) 10 MG tablet Take 2 tablets (20 mg total) by mouth daily. Patient not taking: Reported on 10/01/2014 09/20/14   Malvin Johns, MD   BP 109/62 mmHg  Pulse 73  Temp(Src) 97.8 F (36.6 C) (Oral)  Resp 18  SpO2 100% Physical Exam  Constitutional: She is oriented to person, place, and time. She appears well-developed and well-nourished.  HENT:  Head: Normocephalic and atraumatic.  Eyes: Right eye exhibits no discharge.  Cardiovascular: Normal rate, regular rhythm and normal heart sounds.   No murmur heard. Pulmonary/Chest: Effort normal and breath sounds normal. She has no wheezes. She has no rales.  Abdominal: Soft. She exhibits no distension. There is tenderness.  Mild diffuse tenderness.  Neurological: She is oriented to person, place, and time.  Skin: Skin is warm and dry. She is not diaphoretic.  Psychiatric: She has a normal mood  and affect.  Nursing note and vitals reviewed.   ED Course  Procedures (including critical care time) Labs Review Labs Reviewed  COMPREHENSIVE METABOLIC PANEL - Abnormal; Notable for the following:    Glucose, Bld 100 (*)    Total Protein 6.0 (*)    Alkaline Phosphatase 127 (*)    All other components within normal limits  CBC - Abnormal; Notable for the following:    RDW 17.1 (*)    All other components within normal limits  LIPASE, BLOOD  URINALYSIS, ROUTINE W REFLEX MICROSCOPIC (NOT AT [facility])  PREGNANCY, URINE  POC URINE PREG, ED    Imaging Review No results found. I have personally reviewed and evaluated these images and lab results as part of my medical decision-making.   EKG Interpretation None      MDM   Final diagnoses:  None    Patient is a 45 year old female history of gastric bypass presenting today with constipation. Patient sent here from GI office for rule out obstruction.  We will do CT abdomen pelvis noncontrast rule out obstructive process. Patient also requesting disimpaction versus enema. Once CT abdomen pelvis shows no certain surgical process we can give patient enema and have patient take MiraLAX until she is able to defecate at home.  5:01 PM Discussed with Dr. Wynetta Emery. Willl order Trilytely and followed his instructions on the perscription. Patietn updated about normal CT.  She appears in NAD. She will follow with GI ad PCP.   Teaira Croft Julio Alm, MD 12/03/15 1701

## 2015-12-03 NOTE — Progress Notes (Signed)
Pt confirms with CM that Dr Inda Merlin is her pcp and she has also seen a dr Wynetta Emery

## 2016-03-09 ENCOUNTER — Observation Stay (HOSPITAL_COMMUNITY)
Admission: EM | Admit: 2016-03-09 | Discharge: 2016-03-11 | Disposition: A | Discharge status: Home / Self Care | Payer: 59 | Attending: Internal Medicine | Admitting: Internal Medicine

## 2016-03-09 ENCOUNTER — Encounter (HOSPITAL_COMMUNITY): Payer: Self-pay | Admitting: *Deleted

## 2016-03-09 DIAGNOSIS — T426X5A Adverse effect of other antiepileptic and sedative-hypnotic drugs, initial encounter: Secondary | ICD-10-CM | POA: Diagnosis not present

## 2016-03-09 DIAGNOSIS — F17210 Nicotine dependence, cigarettes, uncomplicated: Secondary | ICD-10-CM | POA: Diagnosis not present

## 2016-03-09 DIAGNOSIS — Z9884 Bariatric surgery status: Secondary | ICD-10-CM | POA: Insufficient documentation

## 2016-03-09 DIAGNOSIS — G4700 Insomnia, unspecified: Secondary | ICD-10-CM | POA: Diagnosis not present

## 2016-03-09 DIAGNOSIS — F419 Anxiety disorder, unspecified: Secondary | ICD-10-CM | POA: Diagnosis not present

## 2016-03-09 DIAGNOSIS — R112 Nausea with vomiting, unspecified: Secondary | ICD-10-CM | POA: Diagnosis not present

## 2016-03-09 DIAGNOSIS — J4520 Mild intermittent asthma, uncomplicated: Secondary | ICD-10-CM | POA: Diagnosis not present

## 2016-03-09 DIAGNOSIS — Z9049 Acquired absence of other specified parts of digestive tract: Secondary | ICD-10-CM | POA: Insufficient documentation

## 2016-03-09 DIAGNOSIS — Z79899 Other long term (current) drug therapy: Secondary | ICD-10-CM | POA: Insufficient documentation

## 2016-03-09 DIAGNOSIS — G251 Drug-induced tremor: Principal | ICD-10-CM | POA: Insufficient documentation

## 2016-03-09 DIAGNOSIS — K589 Irritable bowel syndrome without diarrhea: Secondary | ICD-10-CM | POA: Diagnosis not present

## 2016-03-09 DIAGNOSIS — D473 Essential (hemorrhagic) thrombocythemia: Secondary | ICD-10-CM | POA: Diagnosis not present

## 2016-03-09 DIAGNOSIS — H532 Diplopia: Secondary | ICD-10-CM | POA: Diagnosis not present

## 2016-03-09 DIAGNOSIS — K219 Gastro-esophageal reflux disease without esophagitis: Secondary | ICD-10-CM | POA: Diagnosis present

## 2016-03-09 DIAGNOSIS — T50905A Adverse effect of unspecified drugs, medicaments and biological substances, initial encounter: Secondary | ICD-10-CM | POA: Diagnosis present

## 2016-03-09 DIAGNOSIS — G43909 Migraine, unspecified, not intractable, without status migrainosus: Secondary | ICD-10-CM | POA: Diagnosis not present

## 2016-03-09 DIAGNOSIS — F411 Generalized anxiety disorder: Secondary | ICD-10-CM | POA: Diagnosis present

## 2016-03-09 DIAGNOSIS — F319 Bipolar disorder, unspecified: Secondary | ICD-10-CM | POA: Diagnosis present

## 2016-03-09 DIAGNOSIS — R42 Dizziness and giddiness: Secondary | ICD-10-CM | POA: Insufficient documentation

## 2016-03-09 DIAGNOSIS — M069 Rheumatoid arthritis, unspecified: Secondary | ICD-10-CM | POA: Diagnosis present

## 2016-03-09 DIAGNOSIS — J45909 Unspecified asthma, uncomplicated: Secondary | ICD-10-CM | POA: Diagnosis present

## 2016-03-09 LAB — URINALYSIS, ROUTINE W REFLEX MICROSCOPIC
Bilirubin Urine: NEGATIVE
GLUCOSE, UA: NEGATIVE mg/dL
Hgb urine dipstick: NEGATIVE
KETONES UR: NEGATIVE mg/dL
Nitrite: NEGATIVE
PROTEIN: NEGATIVE mg/dL
Specific Gravity, Urine: 1.011 (ref 1.005–1.030)
pH: 7 (ref 5.0–8.0)

## 2016-03-09 LAB — CBC
HEMATOCRIT: 43 % (ref 36.0–46.0)
Hemoglobin: 13.9 g/dL (ref 12.0–15.0)
MCH: 32.7 pg (ref 26.0–34.0)
MCHC: 32.3 g/dL (ref 30.0–36.0)
MCV: 101.2 fL — ABNORMAL HIGH (ref 78.0–100.0)
Platelets: 426 10*3/uL — ABNORMAL HIGH (ref 150–400)
RBC: 4.25 MIL/uL (ref 3.87–5.11)
RDW: 16.4 % — AB (ref 11.5–15.5)
WBC: 8.6 10*3/uL (ref 4.0–10.5)

## 2016-03-09 LAB — COMPREHENSIVE METABOLIC PANEL
ALBUMIN: 4.1 g/dL (ref 3.5–5.0)
ALK PHOS: 158 U/L — AB (ref 38–126)
ALT: 21 U/L (ref 14–54)
AST: 18 U/L (ref 15–41)
Anion gap: 8 (ref 5–15)
BILIRUBIN TOTAL: 0.7 mg/dL (ref 0.3–1.2)
CALCIUM: 9.6 mg/dL (ref 8.9–10.3)
CO2: 26 mmol/L (ref 22–32)
Chloride: 107 mmol/L (ref 101–111)
Creatinine, Ser: 0.77 mg/dL (ref 0.44–1.00)
GFR calc Af Amer: >60 mL/min (ref >60)
GFR calc non Af Amer: >60 mL/min (ref >60)
GLUCOSE: 106 mg/dL — AB (ref 65–99)
Potassium: 3.7 mmol/L (ref 3.5–5.1)
SODIUM: 141 mmol/L (ref 135–145)
TOTAL PROTEIN: 6.3 g/dL — AB (ref 6.5–8.1)

## 2016-03-09 LAB — URINE MICROSCOPIC-ADD ON

## 2016-03-09 LAB — LITHIUM LEVEL: LITHIUM LVL: 0.68 mmol/L (ref 0.60–1.20)

## 2016-03-09 MED ORDER — SODIUM CHLORIDE 0.9 % IV BOLUS (SEPSIS)
1000.0000 mL | Freq: Once | INTRAVENOUS | Status: AC
  Administered 2016-03-09: 1000 mL via INTRAVENOUS

## 2016-03-09 MED ORDER — LORAZEPAM 2 MG/ML IJ SOLN
0.5000 mg | Freq: Once | INTRAMUSCULAR | Status: AC
  Administered 2016-03-09: 0.5 mg via INTRAVENOUS
  Filled 2016-03-09: qty 1

## 2016-03-09 MED ORDER — ONDANSETRON HCL 4 MG/2ML IJ SOLN
4.0000 mg | Freq: Once | INTRAMUSCULAR | Status: AC
  Administered 2016-03-10: 4 mg via INTRAVENOUS
  Filled 2016-03-09: qty 2

## 2016-03-09 MED ORDER — ONDANSETRON HCL 4 MG/2ML IJ SOLN
4.0000 mg | Freq: Once | INTRAMUSCULAR | Status: AC
  Administered 2016-03-09: 4 mg via INTRAVENOUS
  Filled 2016-03-09: qty 2

## 2016-03-09 NOTE — ED Provider Notes (Signed)
CSN: [id number]     Arrival date & time 03/09/16  1939 History  By signing my name below, I, Soijett Blue, attest that this documentation has been prepared under the direction and in the presence of Charlann Lange, PA-C Electronically Signed: Soijett Blue, ED Scribe. 03/09/2016. 9:07 PM.  Chief Complaint  Patient presents with  . Lithium Toxicity        The history is provided by the patient. No language interpreter was used.    HPI Comments: Jordan Russell is a 45 y.o. female who presents to the Emergency Department complaining of lithium toxiicity onset yesterday. Pt states that she has never taken lithium in the past but it was a new medication within the past 1-2 months that has been increased twice since beginning. Pt reports that these symptoms haven't occurred to her in the past. She states that she is having associated symptoms of bilateral hand tremors left > right x 30 days, bilateral burred vision, nausea, vomiting, dizziness, and gait problem due to dizziness. She states that she has not tried any medications for the relief for her symptoms. She denies fever, pain, and any other symptoms. Pt is allergic to penicillin and sulfa drugs.    Past Medical History  Diagnosis Date  . Arthritis   . Malnutrition ([place])   . Pneumonia   . Reflux   . RA (rheumatoid arthritis) ([place])   . Bipolar disorder [facility])    Past Surgical History  Procedure Laterality Date  . Gastric bypass    . Parathyroid exploration    . Cholecystectomy    . Knee arthroscopy    . Cystoscopy    . Abdominal hysterectomy    . Vaginal sling    . Esophagogastroduodenoscopy (egd) with propofol N/A 10/01/2014    Procedure: ESOPHAGOGASTRODUODENOSCOPY (EGD) WITH PROPOFOL;  Surgeon: Garlan Fair, MD;  Location: [facility];  Service: Endoscopy;  Laterality: N/A;  . Balloon dilation N/A 10/01/2014    Procedure: BALLOON DILATION;  Surgeon: Garlan Fair, MD;  Location: [facility];  Service: Endoscopy;   Laterality: N/A;  . Esophageal manometry N/A 12/23/2014    Procedure: ESOPHAGEAL MANOMETRY (EM);  Surgeon: Garlan Fair, MD;  Location: [facility];  Service: Endoscopy;  Laterality: N/A;   No family history on file. Social History  Substance Use Topics  . Smoking status: Current Every Day Smoker -- 1.00 packs/day    Types: Cigarettes  . Smokeless tobacco: None  . Alcohol Use: Yes     Comment: occ   OB History    No data available     Review of Systems  Constitutional: Negative for fever.  Eyes: Positive for visual disturbance.  Gastrointestinal: Positive for nausea and vomiting.  Musculoskeletal: Positive for gait problem.  Neurological: Positive for dizziness and tremors.      Allergies  Morphine and related; Penicillins; and Sulfa antibiotics  Home Medications   Prior to Admission medications   Medication Sig Start Date End Date Taking? Authorizing Provider  acetaminophen (TYLENOL) 500 MG tablet Take 1,000 mg by mouth every 6 (six) hours as needed for headache.    Yes Historical Provider, MD  ALPRAZolam Duanne Moron) 1 MG tablet Take 1 mg by mouth 2 (two) times daily as needed for anxiety.    Yes Historical Provider, MD  LINZESS 290 MCG CAPS capsule TAKE 1 CAPSULE BY MOUTH EVERY MORNING WITH MEAL 02/03/16  Yes Historical Provider, MD  omeprazole (PRILOSEC) 20 MG capsule Take 20 mg by mouth daily.  Yes Historical Provider, MD  Suvorexant (BELSOMRA) 20 MG TABS Take 20 mg by mouth at bedtime as needed (sleep).   Yes Historical Provider, MD  topiramate (TOPAMAX) 25 MG capsule Take 25 mg by mouth daily as needed (migraines).  12/10/14  Yes Historical Provider, MD  albuterol (PROVENTIL) (2.5 MG/3ML) 0.083% nebulizer solution Take 3 mLs (2.5 mg total) by nebulization every 4 (four) hours as needed for wheezing or shortness of breath. 09/20/14   Malvin Johns, MD  citalopram (CELEXA) 20 MG tablet Take 20 mg by mouth at bedtime. Reported on 03/09/2016    Historical Provider, MD   HYDROcodone-homatropine [facility]) 5-1.5 MG/5ML syrup Take 5 mLs by mouth every 6 (six) hours as needed for cough. Patient not taking: Reported on 10/01/2014 09/20/14   Malvin Johns, MD  ondansetron (ZOFRAN-ODT) 4 MG disintegrating tablet Take 4 mg by mouth every 8 (eight) hours as needed for nausea or vomiting.  03/08/16   Historical Provider, MD  polyethylene glycol-electrolytes (NULYTELY/GOLYTELY) 420 g solution Take 4,000 mLs by mouth once. Patient not taking: Reported on 03/09/2016 12/03/15   Courteney Lyn Mackuen, MD  predniSONE (DELTASONE) 10 MG tablet Take 2 tablets (20 mg total) by mouth daily. Patient not taking: Reported on 10/01/2014 09/20/14   Malvin Johns, MD   BP 115/74 mmHg  Pulse 69  Temp(Src) 98.2 F (36.8 C) (Oral)  Resp 18  SpO2 100% Physical Exam  Constitutional: She is oriented to person, place, and time. She appears well-developed and well-nourished. No distress.  HENT:  Head: Normocephalic and atraumatic.  Eyes: EOM are normal.  Neck: Neck supple.  Cardiovascular: Normal rate.   Pulmonary/Chest: Effort normal. No respiratory distress.  Abdominal: She exhibits no distension.  Musculoskeletal: Normal range of motion.  Neurological: She is alert and oriented to person, place, and time. Coordination normal.  No lateralizing weakness. No deficits of coordination. There is right lateral nystagmus.   Skin: Skin is warm and dry.  Psychiatric: She has a normal mood and affect. Her behavior is normal.  Nursing note and vitals reviewed.   ED Course  Procedures (including critical care time) DIAGNOSTIC STUDIES: Oxygen Saturation is 100% on RA, nl by my interpretation.    COORDINATION OF CARE: 9:14 PM Discussed treatment plan with pt at bedside which includes labs, UA, lithium level, and pt agreed to plan.    Labs Review Labs Reviewed  COMPREHENSIVE METABOLIC PANEL - Abnormal; Notable for the following:    Glucose, Bld 106 (*)    BUN <5 (*)    Total Protein 6.3 (*)     Alkaline Phosphatase 158 (*)    All other components within normal limits  CBC - Abnormal; Notable for the following:    MCV 101.2 (*)    RDW 16.4 (*)    Platelets 426 (*)    All other components within normal limits  URINALYSIS, ROUTINE W REFLEX MICROSCOPIC (NOT AT [facility]) - Abnormal; Notable for the following:    APPearance CLOUDY (*)    Leukocytes, UA SMALL (*)    All other components within normal limits  URINE MICROSCOPIC-ADD ON - Abnormal; Notable for the following:    Squamous Epithelial / LPF 6-30 (*)    Bacteria, UA MANY (*)    All other components within normal limits  LITHIUM LEVEL  LAMOTRIGINE LEVEL    Imaging Review No results found. I have personally reviewed and evaluated these lab results as part of my medical decision-making.   EKG Interpretation None  MDM   Final diagnoses:  None    1. Dizziness 2. Nausea with vomiting 3. Adverse medication reaction  Patient presents with concern for medication toxicity, referred by psychiatrist. She has been on Lithium and Lamictal x 1 month, with incrementally increasing doses as follows: Lithium initial dose 150 mg of 600 mg daily Lamictal initial dose 150 mg once daily, then twice daily and 2 weeks ago increased to 4 times daily.  Nausea resolved with zofran and dizziness improved with Ativan. IVF's being given. Neuro exam without deficit. Lithium level normal, Lamictal level is unavailable but pending for outpatient follow up.   12:30 - patient better after zofran for recurrence of nausea. Dizziness is improved but still present. She has a fine motor tremor at rest which was not there previously.  Discussed with Poison Control who advised IVF's, 3 hour lithium level and observation admission to monitor for acute changes or change in condition. There is concern for lithium and lamictal working together to cause worse toxicity. Lamictal level pending. [facility] consulted. Dr. Myna Hidalgo accepts for  admission.     I personally performed the services described in this documentation, which was scribed in my presence. The recorded information has been reviewed and is accurate.      Charlann Lange, PA-C 03/10/16 [id number]  Milton Ferguson, MD 03/10/16 [phone]

## 2016-03-09 NOTE — ED Notes (Signed)
MD at bedside. 

## 2016-03-09 NOTE — ED Notes (Signed)
Pt states that she has had nausea, vomiting, difficulty ambulating and blurry vision since yesterday; pt states that she has also had tremors intermittently; pt states that she saw her Psychiatrist today and was advised to come here for Lithium toxicity; pt states that she takes Lithium and lamictal

## 2016-03-09 NOTE — ED Notes (Signed)
Pt with shakiness x 1 month began having N/V x 3 days ago was seen by [facility] provider who sent her to ER for possible lithium toxicity.

## 2016-03-10 ENCOUNTER — Encounter (HOSPITAL_COMMUNITY): Payer: Self-pay | Admitting: Family Medicine

## 2016-03-10 ENCOUNTER — Observation Stay (HOSPITAL_COMMUNITY): Payer: 59

## 2016-03-10 DIAGNOSIS — T887XXA Unspecified adverse effect of drug or medicament, initial encounter: Secondary | ICD-10-CM | POA: Diagnosis not present

## 2016-03-10 DIAGNOSIS — M069 Rheumatoid arthritis, unspecified: Secondary | ICD-10-CM

## 2016-03-10 DIAGNOSIS — F319 Bipolar disorder, unspecified: Secondary | ICD-10-CM | POA: Diagnosis present

## 2016-03-10 DIAGNOSIS — R42 Dizziness and giddiness: Secondary | ICD-10-CM | POA: Diagnosis not present

## 2016-03-10 DIAGNOSIS — R112 Nausea with vomiting, unspecified: Secondary | ICD-10-CM | POA: Diagnosis not present

## 2016-03-10 DIAGNOSIS — J4520 Mild intermittent asthma, uncomplicated: Secondary | ICD-10-CM

## 2016-03-10 DIAGNOSIS — T50905A Adverse effect of unspecified drugs, medicaments and biological substances, initial encounter: Secondary | ICD-10-CM | POA: Diagnosis present

## 2016-03-10 DIAGNOSIS — G43909 Migraine, unspecified, not intractable, without status migrainosus: Secondary | ICD-10-CM | POA: Diagnosis present

## 2016-03-10 DIAGNOSIS — F411 Generalized anxiety disorder: Secondary | ICD-10-CM | POA: Diagnosis present

## 2016-03-10 DIAGNOSIS — K219 Gastro-esophageal reflux disease without esophagitis: Secondary | ICD-10-CM | POA: Diagnosis present

## 2016-03-10 DIAGNOSIS — G251 Drug-induced tremor: Secondary | ICD-10-CM | POA: Diagnosis not present

## 2016-03-10 DIAGNOSIS — K589 Irritable bowel syndrome without diarrhea: Secondary | ICD-10-CM | POA: Diagnosis present

## 2016-03-10 DIAGNOSIS — J45909 Unspecified asthma, uncomplicated: Secondary | ICD-10-CM | POA: Diagnosis present

## 2016-03-10 DIAGNOSIS — F419 Anxiety disorder, unspecified: Secondary | ICD-10-CM | POA: Diagnosis not present

## 2016-03-10 DIAGNOSIS — H532 Diplopia: Secondary | ICD-10-CM | POA: Diagnosis not present

## 2016-03-10 DIAGNOSIS — F3130 Bipolar disorder, current episode depressed, mild or moderate severity, unspecified: Secondary | ICD-10-CM

## 2016-03-10 HISTORY — DX: Generalized anxiety disorder: F41.1

## 2016-03-10 LAB — GLUCOSE, CAPILLARY: GLUCOSE-CAPILLARY: 107 mg/dL — AB (ref 65–99)

## 2016-03-10 LAB — LITHIUM LEVEL: LITHIUM LVL: 0.54 mmol/L — AB (ref 0.60–1.20)

## 2016-03-10 MED ORDER — LINACLOTIDE 290 MCG PO CAPS
290.0000 ug | ORAL_CAPSULE | Freq: Every day | ORAL | Status: DC
  Administered 2016-03-10 – 2016-03-11 (×2): 290 ug via ORAL
  Filled 2016-03-10 (×3): qty 1

## 2016-03-10 MED ORDER — PROCHLORPERAZINE EDISYLATE 5 MG/ML IJ SOLN
10.0000 mg | Freq: Once | INTRAMUSCULAR | Status: AC
  Administered 2016-03-10: 10 mg via INTRAVENOUS
  Filled 2016-03-10: qty 2

## 2016-03-10 MED ORDER — SODIUM CHLORIDE 0.9% FLUSH: 3.0000 mL | Freq: Two times a day (BID) | INTRAVENOUS | Status: DC

## 2016-03-10 MED ORDER — SUVOREXANT 20 MG PO TABS: 20.0000 mg | ORAL_TABLET | Freq: Every evening | ORAL | Status: DC | PRN

## 2016-03-10 MED ORDER — ACETAMINOPHEN 500 MG PO TABS
1000.0000 mg | ORAL_TABLET | Freq: Four times a day (QID) | ORAL | Status: DC | PRN
  Administered 2016-03-11: 1000 mg via ORAL
  Filled 2016-03-10: qty 2

## 2016-03-10 MED ORDER — ONDANSETRON HCL 4 MG/2ML IJ SOLN
4.0000 mg | Freq: Four times a day (QID) | INTRAMUSCULAR | Status: DC | PRN
Start: 2016-03-10 — End: 2016-03-11
  Administered 2016-03-10 (×2): 4 mg via INTRAVENOUS
  Filled 2016-03-10: qty 2

## 2016-03-10 MED ORDER — OXYCODONE-ACETAMINOPHEN 5-325 MG PO TABS: 2.0000 | ORAL_TABLET | Freq: Once | ORAL | Status: DC

## 2016-03-10 MED ORDER — ALBUTEROL SULFATE (2.5 MG/3ML) 0.083% IN NEBU: 2.5000 mg | INHALATION_SOLUTION | RESPIRATORY_TRACT | Status: DC | PRN

## 2016-03-10 MED ORDER — ADULT MULTIVITAMIN W/MINERALS CH
1.0000 | ORAL_TABLET | Freq: Every day | ORAL | Status: DC
  Administered 2016-03-11: 1 via ORAL
  Filled 2016-03-10: qty 1

## 2016-03-10 MED ORDER — GI COCKTAIL ~~LOC~~
30.0000 mL | Freq: Once | ORAL | Status: AC
  Administered 2016-03-10: 30 mL via ORAL
  Filled 2016-03-10: qty 30

## 2016-03-10 MED ORDER — ENOXAPARIN SODIUM 40 MG/0.4ML ~~LOC~~ SOLN: 40.0000 mg | SUBCUTANEOUS | Status: DC

## 2016-03-10 MED ORDER — PREMIER PROTEIN SHAKE
11.0000 [oz_av] | ORAL | Status: DC
  Filled 2016-03-10: qty 325.31

## 2016-03-10 MED ORDER — PANTOPRAZOLE SODIUM 40 MG PO TBEC
40.0000 mg | DELAYED_RELEASE_TABLET | Freq: Every day | ORAL | Status: DC
Start: 2016-03-10 — End: 2016-03-11
  Administered 2016-03-10 – 2016-03-11 (×2): 40 mg via ORAL
  Filled 2016-03-10 (×2): qty 1

## 2016-03-10 MED ORDER — POLYETHYLENE GLYCOL 3350 17 G PO PACK: 17.0000 g | PACK | Freq: Every day | ORAL | Status: DC | PRN

## 2016-03-10 MED ORDER — ONDANSETRON HCL 4 MG PO TABS
4.0000 mg | ORAL_TABLET | Freq: Four times a day (QID) | ORAL | Status: DC | PRN
  Administered 2016-03-11: 4 mg via ORAL
  Filled 2016-03-10: qty 1

## 2016-03-10 MED ORDER — ENOXAPARIN SODIUM 60 MG/0.6ML ~~LOC~~ SOLN
50.0000 mg | Freq: Every day | SUBCUTANEOUS | Status: DC
  Administered 2016-03-10 – 2016-03-11 (×2): 50 mg via SUBCUTANEOUS
  Filled 2016-03-10 (×2): qty 0.6

## 2016-03-10 MED ORDER — ONDANSETRON HCL 4 MG/2ML IJ SOLN
4.0000 mg | Freq: Three times a day (TID) | INTRAMUSCULAR | Status: AC | PRN
  Filled 2016-03-10: qty 2

## 2016-03-10 MED ORDER — UNJURY CHICKEN SOUP POWDER
8.0000 [oz_av] | Freq: Two times a day (BID) | ORAL | Status: DC
  Filled 2016-03-10 (×4): qty 27

## 2016-03-10 MED ORDER — PROCHLORPERAZINE EDISYLATE 5 MG/ML IJ SOLN: 10.0000 mg | Freq: Once | INTRAMUSCULAR | Status: DC

## 2016-03-10 MED ORDER — SODIUM CHLORIDE 0.9 % IV SOLN
INTRAVENOUS | Status: AC
  Administered 2016-03-10: 02:00:00 via INTRAVENOUS

## 2016-03-10 MED ORDER — BISACODYL 5 MG PO TBEC: 5.0000 mg | DELAYED_RELEASE_TABLET | Freq: Every day | ORAL | Status: DC | PRN

## 2016-03-10 MED ORDER — ALPRAZOLAM 1 MG PO TABS
1.0000 mg | ORAL_TABLET | Freq: Two times a day (BID) | ORAL | Status: DC | PRN
  Administered 2016-03-10 – 2016-03-11 (×2): 1 mg via ORAL
  Filled 2016-03-10 (×2): qty 1

## 2016-03-10 NOTE — H&P (Signed)
History and Physical    Jordan Russell [id number] DOB: 1971-06-01 DOA: 03/09/2016  Referring Provider: EDP PCP: Henrine Screws, MD  Outpatient Specialists: Psychiatry, gastroenterology  Patient coming from: Home  Chief Complaint: Diplopia, N/V, tremor  HPI: Jordan Russell is a 45 y.o. female with medical history significant for bipolar disorder, GERD, rheumatoid arthritis, insomnia, and IBS who presents to the ED with 2 days of diplopia, nausea, and vomiting. Patient suffers from bipolar disorder and her medication regimen was changed significantly 1 month ago. At that time, Seroquel was stopped, lithium was started, and Lamictal dose was increased. Two weeks later, lithium and Lamictal doses were increased further. She developed hand tremors over the past month, worse with reaching for, and holding an object. Then, on 03/08/2016, patient developed nausea with nonbloody nonbilious vomiting. She developed diplopia at the same time. She has experienced multiple episodes of vomiting today and has experienced persistent diplopia which resolves when she closes one eye. She denies any fevers, chills, headaches, cough, chest pain, palpitations, or dyspnea. She saw her psychiatrist on 03/09/2016 and, given her symptoms described above, there was concern for lithium or Lamictal toxicity and the patient was directed to the emergency department and instructed to discontinue both lithium and Lamictal.  ED Course: Upon arrival to the ED, patient is found to be afebrile, saturating well on room air, and with vital signs stable. CMP features a mild elevation in alkaline phosphatase and CBC is notable for a mild thrombocytosis and mildly elevated MCV. Lithium level was obtained and returns at the lower end of the therapeutic range. Lamictal level was sent to offsite lab. Patient's diplopia has resolved over the past couple hours but nausea persists. Poison control was contacted by the EDP, and given  that her presenting symptoms are consistent with a Lamictal toxicity, observation on telemetry was advised. Patient has remained hemodynamically stable in the ED and will be admitted to the telemetry unit for ongoing observation and management of suspected Lamictal toxicity.  Review of Systems:  All other systems reviewed and apart from HPI, are negative.  Past Medical History  Diagnosis Date  . Arthritis   . Malnutrition ([place])   . Pneumonia   . Reflux   . RA (rheumatoid arthritis) ([place])   . Bipolar disorder [facility])     Past Surgical History  Procedure Laterality Date  . Gastric bypass    . Parathyroid exploration    . Cholecystectomy    . Knee arthroscopy    . Cystoscopy    . Abdominal hysterectomy    . Vaginal sling    . Esophagogastroduodenoscopy (egd) with propofol N/A 10/01/2014    Procedure: ESOPHAGOGASTRODUODENOSCOPY (EGD) WITH PROPOFOL;  Surgeon: Garlan Fair, MD;  [facility];  Service: Endoscopy;  Laterality: N/A;  . Balloon dilation N/A 10/01/2014    Procedure: BALLOON DILATION;  Surgeon: Garlan Fair, MD;  [facility];  Service: Endoscopy;  Laterality: N/A;  . Esophageal manometry N/A 12/23/2014    Procedure: ESOPHAGEAL MANOMETRY (EM);  Surgeon: Garlan Fair, MD;  [facility];  Service: Endoscopy;  Laterality: N/A;     reports that she has been smoking Cigarettes.  She has been smoking about 1.00 pack per day. She does not have any smokeless tobacco history on file. She reports that she drinks alcohol. She reports that she does not use illicit drugs.  Allergies  Allergen Reactions  . Morphine And Related Itching  . Penicillins Itching    As a child, "  breathing, itching problem" Has patient had a PCN reaction causing immediate rash, facial/tongue/throat swelling, SOB or lightheadedness with hypotension: Yes Has patient had a PCN reaction causing severe rash involving mucus membranes or skin necrosis: Yes Has patient had a PCN  reaction that required hospitalization No Has patient had a PCN reaction occurring within the last 10 years: No If all of the above answers are "NO", then may proceed with Cephalosporin use.   . Sulfa Antibiotics Hives    Family History  Problem Relation Age of Onset  . Bipolar disorder Son      Prior to Admission medications   Medication Sig Start Date End Date Taking? Authorizing Provider  acetaminophen (TYLENOL) 500 MG tablet Take 1,000 mg by mouth every 6 (six) hours as needed for headache.    Yes Historical Provider, MD  ALPRAZolam Duanne Moron) 1 MG tablet Take 1 mg by mouth 2 (two) times daily as needed for anxiety.    Yes Historical Provider, MD  LINZESS 290 MCG CAPS capsule TAKE 1 CAPSULE BY MOUTH EVERY MORNING WITH MEAL 02/03/16  Yes Historical Provider, MD  omeprazole (PRILOSEC) 20 MG capsule Take 20 mg by mouth daily.    Yes Historical Provider, MD  Suvorexant (BELSOMRA) 20 MG TABS Take 20 mg by mouth at bedtime as needed (sleep).   Yes Historical Provider, MD  topiramate (TOPAMAX) 25 MG capsule Take 25 mg by mouth daily as needed (migraines).  12/10/14  Yes Historical Provider, MD  albuterol (PROVENTIL) (2.5 MG/3ML) 0.083% nebulizer solution Take 3 mLs (2.5 mg total) by nebulization every 4 (four) hours as needed for wheezing or shortness of breath. 09/20/14   Malvin Johns, MD  citalopram (CELEXA) 20 MG tablet Take 20 mg by mouth at bedtime. Reported on 03/09/2016    Historical Provider, MD  HYDROcodone-homatropine [facility]) 5-1.5 MG/5ML syrup Take 5 mLs by mouth every 6 (six) hours as needed for cough. Patient not taking: Reported on 10/01/2014 09/20/14   Malvin Johns, MD  ondansetron (ZOFRAN-ODT) 4 MG disintegrating tablet Take 4 mg by mouth every 8 (eight) hours as needed for nausea or vomiting.  03/08/16   Historical Provider, MD  polyethylene glycol-electrolytes (NULYTELY/GOLYTELY) 420 g solution Take 4,000 mLs by mouth once. Patient not taking: Reported on 03/09/2016 12/03/15   Courteney  Lyn Mackuen, MD  predniSONE (DELTASONE) 10 MG tablet Take 2 tablets (20 mg total) by mouth daily. Patient not taking: Reported on 10/01/2014 09/20/14   Malvin Johns, MD    Physical Exam: Filed Vitals:   03/09/16 1949  BP: 115/74  Pulse: 69  Temp: 98.2 F (36.8 C)  TempSrc: Oral  Resp: 18  SpO2: 100%      Constitutional: NAD, calm, comfortable Eyes: PERTLA, lids and conjunctivae normal ENMT: Mucous membranes are moist. Posterior pharynx clear of any exudate or lesions.   Neck: normal, supple, no masses, no thyromegaly Respiratory: clear to auscultation bilaterally, no wheezing, no crackles. Normal respiratory effort.   Cardiovascular: S1 & S2 heard, regular rate and rhythm, no murmurs / rubs / gallops. No carotid bruits.  Abdomen: No distension, no tenderness, no masses palpated. Bowel sounds normal.  Musculoskeletal: no clubbing / cyanosis. No joint deformity upper and lower extremities. Normal muscle tone.  Skin: no rashes, lesions, ulcers. No induration Neurologic: CN 2-12 grossly intact. Sensation intact, DTR normal. Strength 5/5 in all 4 limbs.  Psychiatric: Normal judgment and insight. Alert and oriented x 3. Normal mood. Denies SI, HI, or hallucinations.     Labs on Admission: I  have personally reviewed following labs and imaging studies  CBC:  Recent Labs Lab 03/09/16 2024  WBC 8.6  HGB 13.9  HCT 43.0  MCV 101.2*  PLT 123XX123*   Basic Metabolic Panel:  Recent Labs Lab 03/09/16 2024  NA 141  K 3.7  CL 107  CO2 26  GLUCOSE 106*  BUN <5*  CREATININE 0.77  CALCIUM 9.6   GFR: CrCl cannot be calculated (Unknown ideal weight.). Liver Function Tests:  Recent Labs Lab 03/09/16 2024  AST 18  ALT 21  ALKPHOS 158*  BILITOT 0.7  PROT 6.3*  ALBUMIN 4.1   No results for input(s): LIPASE, AMYLASE in the last 168 hours. No results for input(s): AMMONIA in the last 168 hours. Coagulation Profile: No results for input(s): INR, PROTIME in the last 168  hours. Cardiac Enzymes: No results for input(s): CKTOTAL, CKMB, CKMBINDEX, TROPONINI in the last 168 hours. BNP (last 3 results) No results for input(s): PROBNP in the last 8760 hours. HbA1C: No results for input(s): HGBA1C in the last 72 hours. CBG: No results for input(s): GLUCAP in the last 168 hours. Lipid Profile: No results for input(s): CHOL, HDL, LDLCALC, TRIG, CHOLHDL, LDLDIRECT in the last 72 hours. Thyroid Function Tests: No results for input(s): TSH, T4TOTAL, FREET4, T3FREE, THYROIDAB in the last 72 hours. Anemia Panel: No results for input(s): VITAMINB12, FOLATE, FERRITIN, TIBC, IRON, RETICCTPCT in the last 72 hours. Urine analysis:    Component Value Date/Time   COLORURINE YELLOW 03/09/2016 2151   APPEARANCEUR CLOUDY* 03/09/2016 2151   LABSPEC 1.011 03/09/2016 2151   PHURINE 7.0 03/09/2016 2151   GLUCOSEU NEGATIVE 03/09/2016 2151   HGBUR NEGATIVE 03/09/2016 2151   BILIRUBINUR NEGATIVE 03/09/2016 2151   [place] NEGATIVE 03/09/2016 2151   PROTEINUR NEGATIVE 03/09/2016 2151   NITRITE NEGATIVE 03/09/2016 2151   LEUKOCYTESUR SMALL* 03/09/2016 2151   Sepsis Labs: @LABRCNTIP (procalcitonin:4,lacticidven:4) )No results found for this or any previous visit (from the past 240 hour(s)).   Radiological Exams on Admission: No results found.  EKG:  Not yet performed. Ordered and pending.   Assessment/Plan  1. Suspected Lamictal toxicity  - Pt presents with diplopia, N/V  - Lamictal level will be performed off-site  - Lithium level wnl  - Per poison-control recommendations, will monitor on telemetry and continue IVF overnight  - Lamictal and lithium were discontinued by the prescribing psychiatrist on 03/09/16    2. Nausea, vomiting - Suspected secondary to Lamictal toxicity  - Pt tolerating PO fluids in ED, has not vomited here  - Zofran prn N/V  - Continue IV hydration  - Electrolytes wnl   3. Bipolar disorder, anxiety, insomnia - Pt reports that depression has  been much worse lately, but she denies any SI, HI, or hallucinations  - Is following closely with her outpt psychiatrist - Will need new mood stabilizer as lithium and Lamictal discontinued  - Continue current-dose Xanax prn anxiety  - Continue current-dose Suvorexant qHS prn  4. Rheumatoid arthritis - Stable  - Takes Enbrel every Friday, will continue    5. Asthma, mild intermittent - Stable  - Continue prn albuterol   6. IBS - Stable  - Continue current-dose Linzess   7. GERD - Stable  - No evidence of Barrett's esophagus or esophagitis on EGD in December 2015  - Takes omeprazole 20 mg qD at home; replace with formulary equivalent, Protonix, while in hospital    DVT prophylaxis: sq Lovenox  Code Status: Full  Family Communication: Discussed with patient  Disposition  Plan: Admit to telemetry   Admission status: Observation     Vianne Bulls MD [facility] Pager [phone]  If 7PM-7AM, please contact night-coverage [email or web address] Password [email or web address]  03/10/2016, 1:28 AM

## 2016-03-10 NOTE — Progress Notes (Signed)
Initial Nutrition Assessment  DOCUMENTATION CODES:   Obesity unspecified  INTERVENTION:   -Risk manager Protein supplement once for trial. -Provide Unjury Chicken soup BID, each provides 100 kcal, 21g protein. -Provide Multivitamin with minerals daily -Encouraged small frequent meals -RD to continue to monitor  NUTRITION DIAGNOSIS:   Inadequate oral intake related to nausea, vomiting as evidenced by per patient/family report.  GOAL:   Patient will meet greater than or equal to 90% of their needs  MONITOR:   PO intake, Supplement acceptance, Labs, Weight trends, I & O's  REASON FOR ASSESSMENT:   Malnutrition Screening Tool    ASSESSMENT:   45 year old female with a history of bipolar disorder, rheumatoid arthritis, admitted to the medicine service on 03/10/2016 when she presented with complaints of tremor that started several weeks ago, developing nausea vomiting and diplopia in the 24-40 hours prior to hospitalization. Recently her psych meds were changed due to suboptimal control of bipolar disorder having an increase in Lamictal with the initiation of lithium. She stated that symptoms happened after this adjustment was made.  Patient reports eating 1/2 ham and cheese sandwich and vomiting after eating. States this may be because she drank a lot of water after eating. Pt has ordered another sandwich for lunch and plans to take small bites and take her time eating.  Patient with history of gastric bypass 16 years ago. Pt had family and friends in room but asked them to leave once talking about her weight status. Pt guarded regarding weight history. Pt reports "stomach issues" that began 2 years ago. Prior to these issues pt states she weighed ~354 lb, and d/t these issues she has lost weight. Currently weighs 214 lb (40% wt loss x 2 years). Per weight history, pt weighed 249 lb 1.5 years ago in 2015. Pt states that she mainly consumes cheese and salads with dressing at home.  She is unable to tolerate much food at one time. Pt does not use protein supplements at home. States that milky consistency of most supplements cause her to "gag", pt's significant other states she has a high gag reflex. Pt is at risk of malnutrition if continues this PO intake.  Will assess vitamin/mineral supplementation at follow-up. Will order Multivitamin with minerals daily.  Pt is willing to try protein supplements during this admission. Will trial Premier Protein and Unjury chicken soup. Will assess acceptance and tolerance at follow-up.  Labs reviewed. Medications: Zofran PRN  Diet Order:  Diet regular Room service appropriate?: Yes; Fluid consistency:: Thin  Skin:  Reviewed, no issues  Last BM:  5/7  Height:   Ht Readings from Last 1 Encounters:  03/10/16 5\' 6"  (1.676 m)    Weight:   Wt Readings from Last 1 Encounters:  03/10/16 214 lb 4.6 oz (97.2 kg)    Ideal Body Weight:  59.1 kg  BMI:  Body mass index is 34.6 kg/(m^2).  Estimated Nutritional Needs:   Kcal:  1700-1900  Protein:  80-90g  Fluid:  1.9L/day  EDUCATION NEEDS:   Education needs addressed  Clayton Bibles, MS, RD, LDN Pager: [phone] After Hours Pager: [phone]

## 2016-03-10 NOTE — Progress Notes (Signed)
PROGRESS NOTE    Jordan Russell  [id number] DOB: 02/18/1971 DOA: 03/09/2016 PCP: Jordan Screws, MD  Outpatient Specialists:   Brief Narrative: [name] is a 45 year old female with a history of bipolar disorder, rheumatoid arthritis, admitted to the medicine service on 03/10/2016 when she presented with complaints of tremor that started several weeks ago, developing nausea vomiting and diplopia in the 24-40 hours prior to hospitalization. Recently her psych meds were changed due to suboptimal control of bipolar disorder having an increase in Lamictal with the initiation of lithium. She stated that symptoms happened after this adjustment was made. Initial lab work showed a therapeutic lithium level of 0.68.                                        Assessment & Plan:   Principal Problem:   Adverse effects of medication Active Problems:   Bipolar disorder (HCC)   Rheumatoid arthritis (HCC)   Migraine   IBS (irritable bowel syndrome)   Anxiety   Asthma in adult   GERD (gastroesophageal reflux disease)   1.  Tremors. -I suspect tremors are related to lithium therapy as tremors are a well-known, reaction to lithium. -Initial lab work showed a therapeutic lithium level of 0.68. -Lithium has been discontinued -She will need to follow-up with her outpatient psychiatrist to go over her pharmacologic regimen for bipolar disorder  2.  Diplopia. -She reported "seeing double" which could be an effect of her medications. -This morning reporting visual changes improved although continues to have some blurry vision. -Will check a CT scan of brain without contrast to make sure there is no evidence of acute intracranial pathology  3.  History of bipolar disorder. -He presents with severe tremors and as mentioned above I think this is likely related to lithium that was recently started. This will be held until she follows up with her primary psychiatrist in the outpatient  setting.  DVT prophylaxis: Lovenox Code Status: Full code Disposition Plan: Anticipate discharge in the next 24 hours   Subjective: She reports having ongoing tremors this morning, diplopia seems improved although continues to have some blurry vision.  Objective: Filed Vitals:   03/09/16 1949 03/10/16 0206 03/10/16 0309  BP: 115/74 126/73 134/71  Pulse: 69 67 77  Temp: 98.2 F (36.8 C)  98.2 F (36.8 C)  TempSrc: Oral  Oral  Resp: 18 16 16   Height:   5\' 6"  (1.676 m)  Weight:   97.2 kg (214 lb 4.6 oz)  SpO2: 100% 97% 98%   No intake or output data in the 24 hours ending 03/10/16 0954 Filed Weights   03/10/16 0309  Weight: 97.2 kg (214 lb 4.6 oz)    Examination:  General exam: She has bilateral tremors to upper extremities Respiratory system: Clear to auscultation. Respiratory effort normal. Cardiovascular system: S1 & S2 heard, RRR. No JVD, murmurs, rubs, gallops or clicks. No pedal edema. Gastrointestinal system: Abdomen is nondistended, soft and nontender. No organomegaly or masses felt. Normal bowel sounds heard. Central nervous system: Alert and oriented. No focal neurological deficits. Extremities: Symmetric 5 x 5 power. Skin: No rashes, lesions or ulcers Psychiatry: Judgement and insight appear normal. Mood & affect appropriate.     Data Reviewed: I have personally reviewed following labs and imaging studies  CBC:  Recent Labs Lab 03/09/16 2024  WBC 8.6  HGB 13.9  HCT 43.0  MCV  101.2*  PLT 123XX123*   Basic Metabolic Panel:  Recent Labs Lab 03/09/16 2024  NA 141  K 3.7  CL 107  CO2 26  GLUCOSE 106*  BUN <5*  CREATININE 0.77  CALCIUM 9.6   GFR: Estimated Creatinine Clearance: 103.3 mL/min (by C-G formula based on Cr of 0.77). Liver Function Tests:  Recent Labs Lab 03/09/16 2024  AST 18  ALT 21  ALKPHOS 158*  BILITOT 0.7  PROT 6.3*  ALBUMIN 4.1   No results for input(s): LIPASE, AMYLASE in the last 168 hours. No results for input(s):  AMMONIA in the last 168 hours. Coagulation Profile: No results for input(s): INR, PROTIME in the last 168 hours. Cardiac Enzymes: No results for input(s): CKTOTAL, CKMB, CKMBINDEX, TROPONINI in the last 168 hours. BNP (last 3 results) No results for input(s): PROBNP in the last 8760 hours. HbA1C: No results for input(s): HGBA1C in the last 72 hours. CBG:  Recent Labs Lab 03/10/16 0729  GLUCAP 107*   Lipid Profile: No results for input(s): CHOL, HDL, LDLCALC, TRIG, CHOLHDL, LDLDIRECT in the last 72 hours. Thyroid Function Tests: No results for input(s): TSH, T4TOTAL, FREET4, T3FREE, THYROIDAB in the last 72 hours. Anemia Panel: No results for input(s): VITAMINB12, FOLATE, FERRITIN, TIBC, IRON, RETICCTPCT in the last 72 hours. Urine analysis:    Component Value Date/Time   COLORURINE YELLOW 03/09/2016 2151   APPEARANCEUR CLOUDY* 03/09/2016 2151   LABSPEC 1.011 03/09/2016 2151   PHURINE 7.0 03/09/2016 2151   GLUCOSEU NEGATIVE 03/09/2016 2151   [place] NEGATIVE 03/09/2016 2151   [place] 03/09/2016 2151   [place] NEGATIVE 03/09/2016 2151   PROTEINUR NEGATIVE 03/09/2016 2151   NITRITE NEGATIVE 03/09/2016 2151   LEUKOCYTESUR SMALL* 03/09/2016 2151   Sepsis Labs: No results for input(s): PROCALCITON, LATICACIDVEN in the last 168 hours.  No results found for this or any previous visit (from the past 240 hour(s)).       Radiology Studies: No results found.      Scheduled Meds: . sodium chloride   Intravenous STAT  . enoxaparin (LOVENOX) injection  50 mg Subcutaneous Daily  . linaclotide  290 mcg Oral QAC breakfast  . oxyCODONE-acetaminophen  2 tablet Oral Once  . pantoprazole  40 mg Oral Daily  . sodium chloride flush  3 mL Intravenous Q12H   Continuous Infusions:       Time spent:     Kelvin Cellar, MD [facility] Pager [phone]  If 7PM-7AM, please contact night-coverage [email or web address] Password [email or web address] 03/10/2016, 9:54 AM

## 2016-03-11 DIAGNOSIS — T887XXA Unspecified adverse effect of drug or medicament, initial encounter: Secondary | ICD-10-CM | POA: Diagnosis not present

## 2016-03-11 LAB — BASIC METABOLIC PANEL
ANION GAP: 10 (ref 5–15)
BUN: 7 mg/dL (ref 6–20)
CALCIUM: 8.6 mg/dL — AB (ref 8.9–10.3)
CO2: 24 mmol/L (ref 22–32)
Chloride: 109 mmol/L (ref 101–111)
Creatinine, Ser: 0.69 mg/dL (ref 0.44–1.00)
Glucose, Bld: 97 mg/dL (ref 65–99)
Potassium: 3.6 mmol/L (ref 3.5–5.1)
Sodium: 143 mmol/L (ref 135–145)

## 2016-03-11 LAB — CBC
HCT: 36.3 % (ref 36.0–46.0)
HEMOGLOBIN: 11.8 g/dL — AB (ref 12.0–15.0)
MCH: 33 pg (ref 26.0–34.0)
MCHC: 32.5 g/dL (ref 30.0–36.0)
MCV: 101.4 fL — ABNORMAL HIGH (ref 78.0–100.0)
Platelets: 355 10*3/uL (ref 150–400)
RBC: 3.58 MIL/uL — AB (ref 3.87–5.11)
RDW: 16.2 % — ABNORMAL HIGH (ref 11.5–15.5)
WBC: 7.3 10*3/uL (ref 4.0–10.5)

## 2016-03-11 LAB — GLUCOSE, CAPILLARY: Glucose-Capillary: 96 mg/dL (ref 65–99)

## 2016-03-11 NOTE — Progress Notes (Signed)
Patient discharged @ 1033 in stable condition.  Educated pt regarding symptoms to call Dr. about.  Discharge instructions given.  Teach back completed.  Pt will make her own appt with PCP.  She already has an appt scheduled with her psychiatrist.

## 2016-03-11 NOTE — Discharge Summary (Signed)
Physician Discharge Summary  Jordan Russell [id number] DOB: December 21, 1970 DOA: 03/09/2016  PCP: Henrine Screws, MD  Admit date: 03/09/2016 Discharge date: 03/11/2016  Time spent: 35 minutes  Recommendations for Outpatient Follow-up:  1. Please follow-up on patient's bipolar disorder, she is admitted for tremors, diplopia, nausea, vomiting suspect medication induced for which she was instructed to discontinue lithium and Lamictal   Discharge Diagnoses:  Principal Problem:   Adverse effects of medication Active Problems:   Bipolar disorder (HCC)   Rheumatoid arthritis (HCC)   Migraine   IBS (irritable bowel syndrome)   Anxiety   Asthma in adult   GERD (gastroesophageal reflux disease)   Discharge Condition: Stable  Diet recommendation: Regular diet  Filed Weights   03/10/16 0309  Weight: 97.2 kg (214 lb 4.6 oz)    History of present illness:  Jordan Russell is a 45 y.o. female with medical history significant for bipolar disorder, GERD, rheumatoid arthritis, insomnia, and IBS who presents to the ED with 2 days of diplopia, nausea, and vomiting. Patient suffers from bipolar disorder and her medication regimen was changed significantly 1 month ago. At that time, Seroquel was stopped, lithium was started, and Lamictal dose was increased. Two weeks later, lithium and Lamictal doses were increased further. She developed hand tremors over the past month, worse with reaching for, and holding an object. Then, on 03/08/2016, patient developed nausea with nonbloody nonbilious vomiting. She developed diplopia at the same time. She has experienced multiple episodes of vomiting today and has experienced persistent diplopia which resolves when she closes one eye. She denies any fevers, chills, headaches, cough, chest pain, palpitations, or dyspnea. She saw her psychiatrist on 03/09/2016 and, given her symptoms described above, there was concern for lithium or Lamictal toxicity and the  patient was directed to the emergency department and instructed to discontinue both lithium and Lamictal.  ED Course: Upon arrival to the ED, patient is found to be afebrile, saturating well on room air, and with vital signs stable. CMP features a mild elevation in alkaline phosphatase and CBC is notable for a mild thrombocytosis and mildly elevated MCV. Lithium level was obtained and returns at the lower end of the therapeutic range. Lamictal level was sent to offsite lab. Patient's diplopia has resolved over the past couple hours but nausea persists. Poison control was contacted by the EDP, and given that her presenting symptoms are consistent with a Lamictal toxicity, observation on telemetry was advised. Patient has remained hemodynamically stable in the ED and will be admitted to the telemetry unit for ongoing observation and management of suspected Lamictal toxicity.  Hospital Course:  [name] is a 45 year old female with a history of bipolar disorder, rheumatoid arthritis, admitted to the medicine service on 03/10/2016 when she presented with complaints of tremorsthat started several weeks ago, developing nausea vomiting and diplopia in the 24-48hours prior to hospitalization. Recently her psych meds were changed due to suboptimal control of bipolar disorder having an increase in Lamictal with the initiation of lithium. She stated that symptoms happened after this adjustment was made. Initial lab work during this hospitalization showed a therapeutic lithium level of 0.68.With regard to her visual changes this was further worked up with a CT scan of brain without contrast which was negative for tumor or acute infarct. She showed marked improvement over the course of this hospitalization and by 03/11/2016 had resolution of visual changes and tremors. Suspect that symptoms were secondary to increase her Lamictal dose as well as lithium which  is known to cause tremors. On day of discharge we discussed  the importance of following up with her primary psychiatrist Dr. Toy Care in the next week to go over her regimen for bipolar disorder. I instructed her to call her psychiatrist office immediately should she experience changes in mood.   Discharge Exam: Filed Vitals:   03/10/16 2038 03/11/16 0425  BP: 122/77 113/69  Pulse: 63 67  Temp: 98.5 F (36.9 C) 98.1 F (36.7 C)  Resp: 18 18    General: Nontoxic appearing, awake and alert, states feeling much better Cardiovascular: Regular rate and rhythm normal S1-S2 no murmurs rubs or gallops Respiratory: Normal respiratory effort lungs are clear to auscultation bilaterally Abdomen: On abdominal examination soft nontender nondistended positive bowel sounds Extremities: No edema  Discharge Instructions   Discharge Instructions    Call MD for:  difficulty breathing, headache or visual disturbances    Complete by:  As directed      Call MD for:  extreme fatigue    Complete by:  As directed      Call MD for:  hives    Complete by:  As directed      Call MD for:  persistant dizziness or light-headedness    Complete by:  As directed      Call MD for:  persistant nausea and vomiting    Complete by:  As directed      Call MD for:  redness, tenderness, or signs of infection (pain, swelling, redness, odor or green/yellow discharge around incision site)    Complete by:  As directed      Call MD for:  severe uncontrolled pain    Complete by:  As directed      Call MD for:  temperature >100.4    Complete by:  As directed      Call MD for:    Complete by:  As directed      Diet - low sodium heart healthy    Complete by:  As directed      Discharge instructions    Complete by:  As directed   Please follow up with Dr Toy Care in the next week to go over your psychiatric medications. Call her immediately if you experience changes in your mood     Increase activity slowly    Complete by:  As directed           Current Discharge Medication List     CONTINUE these medications which have NOT CHANGED   Details  acetaminophen (TYLENOL) 500 MG tablet Take 1,000 mg by mouth every 6 (six) hours as needed for headache.     ALPRAZolam (XANAX) 1 MG tablet Take 1 mg by mouth 2 (two) times daily as needed for anxiety.     LINZESS 290 MCG CAPS capsule TAKE 1 CAPSULE BY MOUTH EVERY MORNING WITH MEAL Refills: 3    omeprazole (PRILOSEC) 20 MG capsule Take 20 mg by mouth daily.     Suvorexant (BELSOMRA) 20 MG TABS Take 20 mg by mouth at bedtime as needed (sleep).    topiramate (TOPAMAX) 25 MG capsule Take 25 mg by mouth daily as needed (migraines).     albuterol (PROVENTIL) (2.5 MG/3ML) 0.083% nebulizer solution Take 3 mLs (2.5 mg total) by nebulization every 4 (four) hours as needed for wheezing or shortness of breath. Qty: 30 vial, Refills: 0    ondansetron (ZOFRAN-ODT) 4 MG disintegrating tablet Take 4 mg by mouth every 8 (eight) hours as needed for  nausea or vomiting.       STOP taking these medications     citalopram (CELEXA) 20 MG tablet      HYDROcodone-homatropine (HYCODAN) 5-1.5 MG/5ML syrup      polyethylene glycol-electrolytes (NULYTELY/GOLYTELY) 420 g solution      predniSONE (DELTASONE) 10 MG tablet        Allergies  Allergen Reactions  . Morphine And Related Itching  . Penicillins Itching    As a child, "breathing, itching problem" Has patient had a PCN reaction causing immediate rash, facial/tongue/throat swelling, SOB or lightheadedness with hypotension: Yes Has patient had a PCN reaction causing severe rash involving mucus membranes or skin necrosis: Yes Has patient had a PCN reaction that required hospitalization No Has patient had a PCN reaction occurring within the last 10 years: No If all of the above answers are "NO", then may proceed with Cephalosporin use.   . Sulfa Antibiotics Hives   Follow-up Information    Follow up with GATES,ROBERT NEVILL, MD In 2 weeks.   Specialty:  Internal Medicine   Contact  information:   [address] [place] [address] [phone]       Follow up with Derrel Nip, MD In 1 week.   Specialty:  Psychiatry   Contact information:   [address] [place] [address] [phone]        The results of significant diagnostics from this hospitalization (including imaging, microbiology, ancillary and laboratory) are listed below for reference.    Significant Diagnostic Studies: Ct Head Wo Contrast  03/10/2016  CLINICAL DATA:  Diplopia, nausea, and vomiting. History of bipolar disorder with recent medication change. EXAM: CT HEAD WITHOUT CONTRAST TECHNIQUE: Contiguous axial images were obtained from the base of the skull through the vertex without intravenous contrast. COMPARISON:  None. FINDINGS: There is no evidence of acute cortical infarct, intracranial hemorrhage, mass, midline shift, or extra-axial fluid collection. Cerebral volume is lower limits of normal for age. No evidence of hydrocephalus. Scaphocephalic skull shape. Visualized paranasal sinuses and mastoid air cells are clear. Visualized orbits are unremarkable. IMPRESSION: No evidence of acute intracranial abnormality. Electronically Signed   By: Logan Bores M.D.   On: 03/10/2016 11:27    Microbiology: No results found for this or any previous visit (from the past 240 hour(s)).   Labs: Basic Metabolic Panel:  Recent Labs Lab 03/09/16 2024 03/11/16 0332  NA 141 143  K 3.7 3.6  CL 107 109  CO2 26 24  GLUCOSE 106* 97  BUN <5* 7  CREATININE 0.77 0.69  CALCIUM 9.6 8.6*   Liver Function Tests:  Recent Labs Lab 03/09/16 2024  AST 18  ALT 21  ALKPHOS 158*  BILITOT 0.7  PROT 6.3*  ALBUMIN 4.1   No results for input(s): LIPASE, AMYLASE in the last 168 hours. No results for input(s): AMMONIA in the last 168 hours. CBC:  Recent Labs Lab 03/09/16 2024 03/11/16 0332  WBC 8.6 7.3  HGB 13.9 11.8*  HCT 43.0 36.3  MCV  101.2* 101.4*  PLT 426* 355   Cardiac Enzymes: No results for input(s): CKTOTAL, CKMB, CKMBINDEX, TROPONINI in the last 168 hours. BNP: BNP (last 3 results) No results for input(s): BNP in the last 8760 hours.  ProBNP (last 3 results) No results for input(s): PROBNP in the last 8760 hours.  CBG:  Recent Labs Lab 03/10/16 0729  GLUCAP 107*       Signed:  Kelvin Cellar MD.  [facility] 03/11/2016, 7:27 AM

## 2016-03-12 LAB — LAMOTRIGINE LEVEL: Lamotrigine Lvl: 23.2 ug/mL — ABNORMAL HIGH (ref 2.0–20.0)

## 2016-06-28 DIAGNOSIS — I83813 Varicose veins of bilateral lower extremities with pain: Secondary | ICD-10-CM

## 2016-06-28 DIAGNOSIS — I83893 Varicose veins of bilateral lower extremities with other complications: Secondary | ICD-10-CM | POA: Insufficient documentation

## 2016-06-28 HISTORY — DX: Varicose veins of bilateral lower extremities with pain: I83.813

## 2016-11-05 ENCOUNTER — Institutional Professional Consult (permissible substitution): Payer: Self-pay | Admitting: Psychiatry

## 2016-11-08 ENCOUNTER — Other Ambulatory Visit: Payer: Self-pay | Admitting: Urology

## 2016-11-08 ENCOUNTER — Encounter (HOSPITAL_BASED_OUTPATIENT_CLINIC_OR_DEPARTMENT_OTHER): Payer: Self-pay | Admitting: *Deleted

## 2016-11-08 NOTE — Progress Notes (Signed)
NPO AFTER MN WITH EXCEPTION CLEAR LIQUIDS UNTIL 0730 (NO CREAM/ MILK PRODUCTS).  ARRIVE AT 1145.  NEEDS HG.  WILL TAKE WELLBUTRIN AND PRILOSEC AND IF NEEDED TAKE XANAX, OXYCODONE W/ SIPS OF WATER.

## 2016-11-09 ENCOUNTER — Encounter (HOSPITAL_BASED_OUTPATIENT_CLINIC_OR_DEPARTMENT_OTHER): Payer: Self-pay | Admitting: *Deleted

## 2016-11-09 ENCOUNTER — Ambulatory Visit (HOSPITAL_BASED_OUTPATIENT_CLINIC_OR_DEPARTMENT_OTHER): Payer: 59 | Admitting: Anesthesiology

## 2016-11-09 ENCOUNTER — Ambulatory Visit (HOSPITAL_BASED_OUTPATIENT_CLINIC_OR_DEPARTMENT_OTHER)
Admission: RE | Admit: 2016-11-09 | Discharge: 2016-11-09 | Disposition: A | Discharge status: Home / Self Care | Payer: 59 | Source: Ambulatory Visit | Attending: Urology | Admitting: Urology

## 2016-11-09 ENCOUNTER — Encounter (HOSPITAL_BASED_OUTPATIENT_CLINIC_OR_DEPARTMENT_OTHER)
Admission: RE | Disposition: A | Discharge status: Home / Self Care | Payer: Self-pay | Source: Ambulatory Visit | Attending: Urology

## 2016-11-09 DIAGNOSIS — Z87442 Personal history of urinary calculi: Secondary | ICD-10-CM | POA: Insufficient documentation

## 2016-11-09 DIAGNOSIS — K219 Gastro-esophageal reflux disease without esophagitis: Secondary | ICD-10-CM | POA: Insufficient documentation

## 2016-11-09 DIAGNOSIS — Z79899 Other long term (current) drug therapy: Secondary | ICD-10-CM | POA: Diagnosis not present

## 2016-11-09 DIAGNOSIS — N200 Calculus of kidney: Secondary | ICD-10-CM

## 2016-11-09 DIAGNOSIS — J45909 Unspecified asthma, uncomplicated: Secondary | ICD-10-CM | POA: Diagnosis not present

## 2016-11-09 DIAGNOSIS — Z87891 Personal history of nicotine dependence: Secondary | ICD-10-CM | POA: Diagnosis not present

## 2016-11-09 DIAGNOSIS — F419 Anxiety disorder, unspecified: Secondary | ICD-10-CM | POA: Diagnosis not present

## 2016-11-09 DIAGNOSIS — N201 Calculus of ureter: Secondary | ICD-10-CM | POA: Diagnosis not present

## 2016-11-09 DIAGNOSIS — F319 Bipolar disorder, unspecified: Secondary | ICD-10-CM | POA: Diagnosis not present

## 2016-11-09 HISTORY — DX: Unspecified asthma, uncomplicated: J45.909

## 2016-11-09 HISTORY — DX: Gastro-esophageal reflux disease without esophagitis: K21.9

## 2016-11-09 HISTORY — DX: Other problems related to lifestyle: Z72.89

## 2016-11-09 HISTORY — DX: Generalized anxiety disorder: F41.1

## 2016-11-09 HISTORY — PX: CYSTOSCOPY/URETEROSCOPY/HOLMIUM LASER/STENT PLACEMENT: SHX6546

## 2016-11-09 HISTORY — DX: Personal history of urinary calculi: Z87.442

## 2016-11-09 HISTORY — DX: Urgency of urination: R39.15

## 2016-11-09 HISTORY — DX: Bipolar disorder, unspecified: F31.9

## 2016-11-09 HISTORY — PX: HOLMIUM LASER APPLICATION: SHX5852

## 2016-11-09 HISTORY — DX: Diaphragmatic hernia without obstruction or gangrene: K44.9

## 2016-11-09 HISTORY — DX: Personal history of other endocrine, nutritional and metabolic disease: Z86.39

## 2016-11-09 HISTORY — DX: Other specified postprocedural states: Z98.890

## 2016-11-09 HISTORY — DX: Presence of spectacles and contact lenses: Z97.3

## 2016-11-09 HISTORY — DX: Irritable bowel syndrome, unspecified: K58.9

## 2016-11-09 HISTORY — DX: Other specified postprocedural states: R11.2

## 2016-11-09 HISTORY — DX: Calculus of ureter: N20.1

## 2016-11-09 HISTORY — DX: Migraine, unspecified, not intractable, without status migrainosus: G43.909

## 2016-11-09 LAB — POCT HEMOGLOBIN-HEMACUE: HEMOGLOBIN: 12.8 g/dL (ref 12.0–15.0)

## 2016-11-09 SURGERY — CYSTOSCOPY/URETEROSCOPY/HOLMIUM LASER/STENT PLACEMENT
Anesthesia: General | Site: Ureter | Laterality: Left

## 2016-11-09 MED ORDER — CIPROFLOXACIN IN D5W 400 MG/200ML IV SOLN
INTRAVENOUS | Status: DC | PRN
  Administered 2016-11-09: 400 mg via INTRAVENOUS

## 2016-11-09 MED ORDER — DEXAMETHASONE SODIUM PHOSPHATE 10 MG/ML IJ SOLN
INTRAMUSCULAR | Status: AC
  Filled 2016-11-09: qty 1

## 2016-11-09 MED ORDER — LACTATED RINGERS IV SOLN
INTRAVENOUS | Status: DC
  Administered 2016-11-09 (×2): via INTRAVENOUS
  Filled 2016-11-09: qty 1000

## 2016-11-09 MED ORDER — SODIUM CHLORIDE 0.9 % IV SOLN
INTRAVENOUS | Status: DC | PRN
  Administered 2016-11-09: 4 mL

## 2016-11-09 MED ORDER — OXYCODONE-ACETAMINOPHEN 5-325 MG PO TABS: 1.0000 | ORAL_TABLET | ORAL | 0 refills | Status: DC | PRN

## 2016-11-09 MED ORDER — PROMETHAZINE HCL 25 MG/ML IJ SOLN
6.2500 mg | INTRAMUSCULAR | Status: DC | PRN
  Filled 2016-11-09: qty 1

## 2016-11-09 MED ORDER — CIPROFLOXACIN IN D5W 400 MG/200ML IV SOLN
INTRAVENOUS | Status: AC
  Filled 2016-11-09: qty 200

## 2016-11-09 MED ORDER — FENTANYL CITRATE (PF) 100 MCG/2ML IJ SOLN
25.0000 ug | INTRAMUSCULAR | Status: DC | PRN
  Filled 2016-11-09: qty 1

## 2016-11-09 MED ORDER — PROPOFOL 10 MG/ML IV BOLUS
INTRAVENOUS | Status: DC | PRN
  Administered 2016-11-09: 300 mg via INTRAVENOUS

## 2016-11-09 MED ORDER — LIDOCAINE 2% (20 MG/ML) 5 ML SYRINGE
INTRAMUSCULAR | Status: DC | PRN
  Administered 2016-11-09: 100 mg via INTRAVENOUS

## 2016-11-09 MED ORDER — SCOPOLAMINE 1 MG/3DAYS TD PT72
1.0000 | MEDICATED_PATCH | TRANSDERMAL | Status: DC
  Administered 2016-11-09: 1.5 mg via TRANSDERMAL
  Filled 2016-11-09: qty 1

## 2016-11-09 MED ORDER — FENTANYL CITRATE (PF) 100 MCG/2ML IJ SOLN
INTRAMUSCULAR | Status: AC
  Filled 2016-11-09: qty 2

## 2016-11-09 MED ORDER — FENTANYL CITRATE (PF) 100 MCG/2ML IJ SOLN
INTRAMUSCULAR | Status: DC | PRN
  Administered 2016-11-09 (×2): 50 ug via INTRAVENOUS

## 2016-11-09 MED ORDER — DEXAMETHASONE SODIUM PHOSPHATE 4 MG/ML IJ SOLN
INTRAMUSCULAR | Status: DC | PRN
  Administered 2016-11-09: 10 mg via INTRAVENOUS

## 2016-11-09 MED ORDER — KETOROLAC TROMETHAMINE 30 MG/ML IJ SOLN
INTRAMUSCULAR | Status: DC | PRN
  Administered 2016-11-09: 30 mg via INTRAVENOUS

## 2016-11-09 MED ORDER — LIDOCAINE 2% (20 MG/ML) 5 ML SYRINGE
INTRAMUSCULAR | Status: AC
  Filled 2016-11-09: qty 5

## 2016-11-09 MED ORDER — CIPROFLOXACIN IN D5W 400 MG/200ML IV SOLN
400.0000 mg | INTRAVENOUS | Status: DC
  Filled 2016-11-09: qty 200

## 2016-11-09 MED ORDER — ONDANSETRON HCL 4 MG/2ML IJ SOLN
INTRAMUSCULAR | Status: AC
  Filled 2016-11-09: qty 2

## 2016-11-09 MED ORDER — MIDAZOLAM HCL 2 MG/2ML IJ SOLN
INTRAMUSCULAR | Status: AC
  Filled 2016-11-09: qty 2

## 2016-11-09 MED ORDER — CIPROFLOXACIN HCL 500 MG PO TABS: 500.0000 mg | ORAL_TABLET | Freq: Two times a day (BID) | ORAL | 0 refills | Status: DC

## 2016-11-09 MED ORDER — MIDAZOLAM HCL 5 MG/5ML IJ SOLN
INTRAMUSCULAR | Status: DC | PRN
  Administered 2016-11-09: 2 mg via INTRAVENOUS

## 2016-11-09 MED ORDER — PROPOFOL 10 MG/ML IV BOLUS
INTRAVENOUS | Status: AC
  Filled 2016-11-09: qty 20

## 2016-11-09 MED ORDER — KETOROLAC TROMETHAMINE 30 MG/ML IJ SOLN
INTRAMUSCULAR | Status: AC
  Filled 2016-11-09: qty 1

## 2016-11-09 MED ORDER — ONDANSETRON HCL 4 MG/2ML IJ SOLN
INTRAMUSCULAR | Status: DC | PRN
  Administered 2016-11-09: 4 mg via INTRAVENOUS

## 2016-11-09 MED ORDER — SCOPOLAMINE 1 MG/3DAYS TD PT72
MEDICATED_PATCH | TRANSDERMAL | Status: AC
  Filled 2016-11-09: qty 1

## 2016-11-09 SURGICAL SUPPLY — 21 items
BAG DRAIN URO-CYSTO SKYTR STRL (DRAIN) ×3 IMPLANT
BAG DRN UROCATH (DRAIN) ×2
BASKET ZERO TIP NITINOL 2.4FR (BASKET) ×3 IMPLANT
BSKT STON RTRVL ZERO TP 2.4FR (BASKET) ×2
CATH URET 5FR 28IN OPEN ENDED (CATHETERS) ×3 IMPLANT
CATH URET DUAL LUMEN 6-10FR 50 (CATHETERS) ×3 IMPLANT
CLOTH BEACON ORANGE TIMEOUT ST (SAFETY) ×3 IMPLANT
FIBER LASER TRAC TIP (UROLOGICAL SUPPLIES) ×3 IMPLANT
GLOVE BIO SURGEON STRL SZ7.5 (GLOVE) ×3 IMPLANT
GOWN STRL REUS W/ TWL XL LVL3 (GOWN DISPOSABLE) ×2 IMPLANT
GOWN STRL REUS W/TWL XL LVL3 (GOWN DISPOSABLE) ×3
GUIDEWIRE STR DUAL SENSOR (WIRE) ×3 IMPLANT
IV NS IRRIG 3000ML ARTHROMATIC (IV SOLUTION) ×3 IMPLANT
KIT ROOM TURNOVER WOR (KITS) ×3 IMPLANT
MANIFOLD NEPTUNE II (INSTRUMENTS) ×3 IMPLANT
NS IRRIG 500ML POUR BTL (IV SOLUTION) ×3 IMPLANT
PACK CYSTOSCOPY (CUSTOM PROCEDURE TRAY) ×3 IMPLANT
SCRUB PCMX 4 OZ (MISCELLANEOUS) ×3 IMPLANT
STENT URET 6FRX26 CONTOUR (STENTS) ×3 IMPLANT
SYRINGE IRR TOOMEY STRL 70CC (SYRINGE) ×3 IMPLANT
TUBE CONNECTING 12X1/4 (SUCTIONS) ×3 IMPLANT

## 2016-11-09 NOTE — Transfer of Care (Signed)
Last Vitals:  Vitals:   11/09/16 1135 11/09/16 1436  BP: 120/74 126/84  Pulse: 77 77  Resp: 18 11  Temp: 36.4 C 36.4 C    Last Pain:  Vitals:   11/09/16 1200  TempSrc:   PainSc: 5       Patients Stated Pain Goal: 10 (11/09/16 1200) Immediate Anesthesia Transfer of Care Note  Patient: DEVITA HEWATT  Procedure(s) Performed: Procedure(s) (LRB): CYSTOSCOPY/URETEROSCOPY/HOLMIUM LASER/STENT PLACEMENT (Left) HOLMIUM LASER APPLICATION (Left)  Patient Location: PACU  Anesthesia Type: General  Level of Consciousness: awake, alert  and oriented  Airway & Oxygen Therapy: Patient Spontanous Breathing and Patient connected to nasal cannula oxygen  Post-op Assessment: Report given to PACU RN and Post -op Vital signs reviewed and stable  Post vital signs: Reviewed and stable  Complications: No apparent anesthesia complications

## 2016-11-09 NOTE — Anesthesia Postprocedure Evaluation (Signed)
Anesthesia Post Note  Patient: Jordan Russell  Procedure(s) Performed: Procedure(s) (LRB): CYSTOSCOPY/URETEROSCOPY/HOLMIUM LASER/STENT PLACEMENT (Left) HOLMIUM LASER APPLICATION (Left)  Patient location during evaluation: PACU Anesthesia Type: General Level of consciousness: awake and alert Pain management: pain level controlled Vital Signs Assessment: post-procedure vital signs reviewed and stable Respiratory status: spontaneous breathing, nonlabored ventilation, respiratory function stable and patient connected to nasal cannula oxygen Cardiovascular status: blood pressure returned to baseline and stable Postop Assessment: no signs of nausea or vomiting Anesthetic complications: no       Last Vitals:  Vitals:   11/09/16 1445 11/09/16 1500  BP: 131/87 125/87  Pulse: 73 72  Resp: 10 11  Temp:      Last Pain:  Vitals:   11/09/16 1436  TempSrc:   PainSc: 0-No pain                 Tiajuana Amass

## 2016-11-09 NOTE — Discharge Instructions (Signed)

## 2016-11-09 NOTE — Op Note (Signed)
Date of procedure: 11/09/16  Preoperative diagnosis:  1. Left ureteral stone   Postoperative diagnosis:  1. Left ureteral stone   Procedure: 1. Cystoscopy 2. Left ureteroscopy 3. Laser lithotripsy 4. Stone basketing 5. Left retrograde pyelogram with interpretation 6. Left ureteral stent placement 6 French by 26 cm  Surgeon:  , MD  Anesthesia: General  Complications: None  Intraoperative findings: The patient had a 7-8 mm distal left ureteral stone that was removed after laser lithotripsy. No further stenosis stone fragments in the left ureter. Left retrograde polygrams further filling defects in the left collecting system.  EBL:  None  Specimens:  Left ureteral stone to office lab  Drains:  6 French by 26 cm left double-J ureteral stent  Disposition: Stable to the postanesthesia care unit  Indication for procedure: The patient is a 45 y.o. female with  a 7 to 8 mm left distal ureteral stone who failed medical expulsive therapy who presents today for definitive stone management.  After reviewing the management options for treatment, the patient elected to proceed with the above surgical procedure(s). We have discussed the potential benefits and risks of the procedure, side effects of the proposed treatment, the likelihood of the patient achieving the goals of the procedure, and any potential problems that might occur during the procedure or recuperation. Informed consent has been obtained.  Description of procedure: The patient was met in the preoperative area. All risks, benefits, and indications of the procedure were described in great detail. The patient consented to the procedure. Preoperative antibiotics were given. The patient was taken to the operative theater. General anesthesia was induced per the anesthesia service. The patient was then placed in the dorsal lithotomy position and prepped and draped in the usual sterile fashion. A preoperative timeout was  called.   A 21 French 30 cystoscope was inserted to the patient's bladder per urethra atraumatically. The left ear orifice was visualized the sensor wire was advanced level of the left renal pelvis under fluoroscopy. Cystoscope was exchanged for a semirigid ureteroscope. The left ureter was then inspected with the ureteroscope, and the stone was identified in the distal left ureter.  It was broken into smaller pieces laser lithotripsy. These pieces and removed from the ureter and through the urethral meatus with a 0 tip basket. Pain ureteroscopy after stone removal revealed no further stones. Left retrograde pyelogram showed no further filling defects in the left collecting system. The cystoscope was then reinserted over the sensor wire and a 6 French by 2670 double-J left ureteral stent was placed. The sensor wire was removed. A curl seen in the patient's left renal pelvis on fluoroscopy and the urinary bladder direct visualization. This point the patient's bladder was drained. She was woke from anesthesia and transferred stable condition to the postanesthesia care unit.   Plan:  The patient eill follow-up in one week for left ureteral stent removal in the office. We will need to discuss Topamax as a possible source of stone formation. She will need a renal ultrasound 1 month after stent removal to rule out iatrogenic hydronephrosis.   , M.D.   

## 2016-11-09 NOTE — Anesthesia Preprocedure Evaluation (Addendum)
Anesthesia Evaluation  Patient identified by MRN, date of birth, ID band Patient awake    Reviewed: Allergy & Precautions, NPO status , Patient's Chart, lab work & pertinent test results  History of Anesthesia Complications (+) PONV  Airway Mallampati: II  TM Distance: >3 FB Neck ROM: Full    Dental  (+) Dental Advisory Given   Pulmonary asthma , former smoker,    breath sounds clear to auscultation       Cardiovascular negative cardio ROS   Rhythm:Regular Rate:Normal     Neuro/Psych  Headaches, Anxiety Bipolar Disorder    GI/Hepatic Neg liver ROS, GERD  ,  Endo/Other  negative endocrine ROS  Renal/GU Renal disease     Musculoskeletal  (+) Arthritis , Fibromyalgia -  Abdominal   Peds  Hematology negative hematology ROS (+)   Anesthesia Other Findings   Reproductive/Obstetrics                            Lab Results  Component Value Date   WBC 7.3 03/11/2016   HGB 12.8 11/09/2016   HCT 36.3 03/11/2016   MCV 101.4 (H) 03/11/2016   PLT 355 03/11/2016   Lab Results  Component Value Date   CREATININE 0.69 03/11/2016   BUN 7 03/11/2016   NA 143 03/11/2016   K 3.6 03/11/2016   CL 109 03/11/2016   CO2 24 03/11/2016    Anesthesia Physical Anesthesia Plan  ASA: II  Anesthesia Plan: General   Post-op Pain Management:    Induction: Intravenous  Airway Management Planned: LMA  Additional Equipment:   Intra-op Plan:   Post-operative Plan: Extubation in OR  Informed Consent: I have reviewed the patients History and Physical, chart, labs and discussed the procedure including the risks, benefits and alternatives for the proposed anesthesia with the patient or authorized representative who has indicated his/her understanding and acceptance.   Dental advisory given  Plan Discussed with: CRNA  Anesthesia Plan Comments:         Anesthesia Quick Evaluation

## 2016-11-09 NOTE — H&P (Signed)
CC: I have kidney stones.  HPI: Jordan Russell is a 46 year-old female patient who is here for renal calculi.  The problem is on the left side. This is not her first kidney stone. She has not caught a stone in her urine strainer since her symptoms began.   She has had eswl, ureteral stent, and ureteroscopy for treatment of her stones in the past.   Patient with significant kidney stone history. She is as noted and decreased stone production since her thyroid surgery. As of February 2017, she had approximately an approximately 7 mm stone on the left and a 3 mm stone on the right. She has been having intermittent left flank pain for 3 weeks. She has also had nausea and vomiting today. This is typical to her usual stone pain. Her urinalysis today does show microhematuria. She has had many stones in the past as previously mentioned. She's required ureteroscopy. She's also had lithotripsy which was not successful.     ALLERGIES: Morphine Sulfate Penicillins Sulfa    MEDICATIONS: Omeprazole  ALPRAZolam 1 MG Oral Tablet Oral  Linzess 290 mcg capsule  Seroquel  Symbyax 6 mg-25 mg capsule  Wellbutrin Sr 100 mg tablet, extended release 12 hr     GU PSH: Cysto Uretero Remove Stone - 2011 Cystoscopy Retrogrades - 2010 Hysterectomy Unilat SO - 2008 Insert Mesh/pelvic Flr Addon - 2010 Repair of Rectocele - 2010 Repair Vaginal Prolapse - 2010 Sling - 2010      PSH Notes: Parathyroid Resection, Cystoscopy With Ureteroscopy With Removal Of Calculus, Sacrospinous Ligament Fixation For Posthysterectomy Prolapse, Vaginal Sling Operation For Stress Incontinence, Posterior Colporrhaphy (For Pelvic Relaxation), Cystoscopy With Ureteral Catheterization, Vaginal Surg Insertion Of Mesh For Pelvic Floor Repair, Hysterectomy, High Gastric Bypass   NON-GU PSH: Explore Parathyroid Glands - 2011 Knee Arthroscopy/surgery, Right - 2015    GU PMH: Acute Cystitis, Acute cystitis without hematuria -  2014 Acute vaginitis, Nonspecific vaginitis - 2014 Calculus Ureter, Calculus of distal right ureter - 2014 Candidiasis of vulva and vagina, Vaginal candidiasis - 2014 Cystocele, Unspec, Cystocele - 2014 Kidney Stone, Bilateral kidney stones - 2014, Nephrolithiasis, - 2014 Mixed incontinence, Urge and stress incontinence - 2014 Rectocele, Rectocele - 2014 Urinary Frequency, Increased urinary frequency - 2014 Urinary Urgency, Urinary urgency - 2014      PMH Notes:  2006-12-07 11:22:49 - Note: Anxiety  2008-12-03 14:51:36 - Note: Flank Pain Right   NON-GU PMH: Fever, unspecified, Fever - 2014 Hyperparathyroidism, unspecified, Hyperparathyroidism - 2014 Personal history of other mental and behavioral disorders, History of depression - 2014 Encounter for general adult medical examination without abnormal findings, Encounter for preventive health examination - 2010    FAMILY HISTORY: nephrolithiasis - Runs In Family   SOCIAL HISTORY: Marital Status: Married Current Smoking Status: Patient does not smoke anymore. Has not smoked since 11/02/2015. Smoked for 25 years. Smoked 1/2 pack per day.   Tobacco Use Assessment Completed: Used Tobacco in last 30 days? Drinks 2 drinks per week.  Drinks 3 caffeinated drinks per day. Patient's occupation Architectural technologist.     Notes: Former smoker, Caffeine Use, Alcohol Use   REVIEW OF SYSTEMS:    GU Review Female:   Patient denies frequent urination, hard to postpone urination, burning /pain with urination, get up at night to urinate, leakage of urine, stream starts and stops, trouble starting your stream, have to strain to urinate, and currently pregnant.  Gastrointestinal (Upper):   Patient reports nausea and vomiting. Patient denies indigestion/ heartburn.  Gastrointestinal (Lower):   Patient denies diarrhea and constipation.  Constitutional:   Patient denies fever, night sweats, weight loss, and fatigue.  Skin:   Patient reports skin rash/  lesion and itching.   Eyes:   Patient denies blurred vision and double vision.  Ears/ Nose/ Throat:   Patient denies sore throat and sinus problems.  Hematologic/Lymphatic:   Patient denies swollen glands and easy bruising.  Cardiovascular:   Patient denies leg swelling and chest pains.  Respiratory:   Patient denies cough and shortness of breath.  Endocrine:   Patient denies excessive thirst.  Musculoskeletal:   Patient reports back pain and joint pain.   Neurological:   Patient denies headaches and dizziness.  Psychologic:   Patient reports depression and anxiety.    VITAL SIGNS:      11/05/2016 03:49 PM  Weight 235 lb / 106.59 kg  Height 6 in / 15.24 cm  BP 100/68 mmHg  Pulse 82 /min  Temperature 97.6 F / 36 C  BMI 4,589.0 kg/m   GU PHYSICAL EXAMINATION:    Cervix: S/P Hysterectomy  Uterus: S/P Hysterectomy   MULTI-SYSTEM PHYSICAL EXAMINATION:    Constitutional: Well-nourished. No physical deformities. Normally developed. Good grooming.  Neck: Neck symmetrical, not swollen. Normal tracheal position.  Respiratory: No labored breathing, no use of accessory muscles.   Cardiovascular: Normal temperature, normal extremity pulses, no swelling, no varicosities.  Lymphatic: No enlargement of neck, axillae, groin.  Skin: No paleness, no jaundice, no cyanosis. No lesion, no ulcer, no rash.  Neurologic / Psychiatric: Oriented to time, oriented to place, oriented to person. No depression, no anxiety, no agitation.  Gastrointestinal: No mass, no tenderness, no rigidity, non obese abdomen.  Eyes: Normal conjunctivae. Normal eyelids.  Ears, Nose, Mouth, and Throat: Left ear no scars, no lesions, no masses. Right ear no scars, no lesions, no masses. Nose no scars, no lesions, no masses. Normal hearing. Normal lips.  Musculoskeletal: Normal gait and station of head and neck.     PAST DATA REVIEWED:  Source Of History:  Patient  X-Ray Review: C.T. Stone Protocol: Reviewed Films. Reviewed  Report. Discussed With Patient.     PROCEDURES:         C.T. Urogram - [id number]               Urinalysis w/Scope Dipstick Dipstick Cont'd Micro  Color: Yellow Bilirubin: Neg WBC/hpf: 0 - 5/hpf  Appearance: Clear Ketones: Neg RBC/hpf: 3 - 10/hpf  Specific Gravity: 1.015 Blood: 2+ Bacteria: NS (Not Seen)  pH: 6.0 Protein: Neg Cystals: NS (Not Seen)  Glucose: Neg Urobilinogen: 0.2 Casts: NS (Not Seen)    Nitrites: Neg Trichomonas: Not Present    Leukocyte Esterase: Neg Mucous: Not Present      Epithelial Cells: NS (Not Seen)      Yeast: NS (Not Seen)      Sperm: Not Present         Ketoralac 60mg  - [id number], [id number] Qty: 60 Adm. By: Marin Roberts  Unit: mg Lot No [id number]  Route: IM Exp. Date 07/20/2017  Freq: None Mfgr.:   Site: Left Buttock   ASSESSMENT:      ICD-10 Details  1 GU:   Kidney Stone - N20.0   2   Asymptomatic microscopic hematuria - R31.21    PLAN:            Medications New Meds: Flomax 0.4 mg capsule, ext release 24 hr 1 capsule PO Daily   #30  2 Refill(s)  Percocet 5 mg-325 mg tablet 1 tablet PO Q 4 H   #30  0 Refill(s)  Percocet 5 mg-325 mg tablet 1 tablet PO Q 4 H   #30  0 Refill(s)            Orders X-Rays: C.T. Stone Protocol Without Contrast  X-Ray Notes: . History:  Hematuria: Yes/No  Patient to see MD after exam: Yes/No  Previous exam: CT / IVP/ US/ KUB/ None  When:  Where:  Diabetic: Yes/ No  BUN/ Creatinine:  Date of last BUN Creatinine:  Weight in pounds:  Allergy- IV Contrast: Yes/ No  Conflicting diabetic meds: Yes/ No  Diabetic Meds:  Prior Authorization [id number]          Schedule         Document Letter(s):  Created for Patient: Clinical Summary    I went over the treatment options for their stone. We discussed ongoing medical expulsion therapy, ESWL and ureteroscopy. Ultimately the patient and I agreed that ureterscopy is the best option. I went over this surgery with the patient in detail. The patient  understands after being put to sleep, we would proceed with a telescope to access the stone and potentially use a laser to fragment the stone before removing it with a basket. After removing the stone the patient will require temporary stent placement in the ureter. This is an outpatient procedure. I also discussed the potential of not being able to gain access safely into the ureter/kidney. This would require that a stent be placed and then the patient rescheduled several weeks later for a second attempt. They also understand the small risks of ureteral trauma causing a stricture or permanent damage. I also explained the risk of urinary tract infection. Having gone over the procedure itself, the expected outcome, and the risks/benefits the patient has agreed to proceed.        Notes:   Plan for cystoscopy, left ureteroscopy, laser lithotripsy, left ureteral stent.

## 2016-11-09 NOTE — Anesthesia Procedure Notes (Signed)
Procedure Name: LMA Insertion Date/Time: 11/09/2016 1:40 PM Performed by: Suzette Battiest Pre-anesthesia Checklist: Patient identified, Emergency Drugs available, Suction available and Patient being monitored Patient Re-evaluated:Patient Re-evaluated prior to inductionOxygen Delivery Method: Circle system utilized Preoxygenation: Pre-oxygenation with 100% oxygen Intubation Type: IV induction Ventilation: Mask ventilation without difficulty LMA: LMA inserted LMA Size: 4.0 Number of attempts: 1 Airway Equipment and Method: Bite block Placement Confirmation: positive ETCO2 Tube secured with: Tape Dental Injury: Teeth and Oropharynx as per pre-operative assessment

## 2016-11-10 ENCOUNTER — Encounter (HOSPITAL_BASED_OUTPATIENT_CLINIC_OR_DEPARTMENT_OTHER): Payer: Self-pay | Admitting: Urology

## 2016-11-12 ENCOUNTER — Institutional Professional Consult (permissible substitution): Payer: Self-pay | Admitting: Psychiatry

## 2016-11-18 ENCOUNTER — Institutional Professional Consult (permissible substitution): Payer: Self-pay | Admitting: Psychiatry

## 2016-12-17 ENCOUNTER — Ambulatory Visit (INDEPENDENT_AMBULATORY_CARE_PROVIDER_SITE_OTHER): Payer: 59 | Admitting: Psychiatry

## 2016-12-17 ENCOUNTER — Ambulatory Visit
Admission: RE | Admit: 2016-12-17 | Discharge: 2016-12-17 | Disposition: A | Discharge status: Home / Self Care | Payer: 59 | Source: Ambulatory Visit | Attending: Psychiatry | Admitting: Psychiatry

## 2016-12-17 ENCOUNTER — Encounter: Payer: Self-pay | Admitting: Psychiatry

## 2016-12-17 ENCOUNTER — Encounter
Admission: RE | Admit: 2016-12-17 | Discharge: 2016-12-17 | Disposition: A | Discharge status: Home / Self Care | Payer: 59 | Source: Ambulatory Visit | Attending: Psychiatry | Admitting: Psychiatry

## 2016-12-17 DIAGNOSIS — D509 Iron deficiency anemia, unspecified: Secondary | ICD-10-CM

## 2016-12-17 DIAGNOSIS — R351 Nocturia: Secondary | ICD-10-CM | POA: Insufficient documentation

## 2016-12-17 DIAGNOSIS — R1314 Dysphagia, pharyngoesophageal phase: Secondary | ICD-10-CM | POA: Insufficient documentation

## 2016-12-17 DIAGNOSIS — M797 Fibromyalgia: Secondary | ICD-10-CM | POA: Insufficient documentation

## 2016-12-17 DIAGNOSIS — J45909 Unspecified asthma, uncomplicated: Secondary | ICD-10-CM

## 2016-12-17 DIAGNOSIS — F322 Major depressive disorder, single episode, severe without psychotic features: Secondary | ICD-10-CM | POA: Insufficient documentation

## 2016-12-17 DIAGNOSIS — N200 Calculus of kidney: Secondary | ICD-10-CM

## 2016-12-17 DIAGNOSIS — M2550 Pain in unspecified joint: Secondary | ICD-10-CM

## 2016-12-17 DIAGNOSIS — G5602 Carpal tunnel syndrome, left upper limb: Secondary | ICD-10-CM | POA: Insufficient documentation

## 2016-12-17 DIAGNOSIS — J45901 Unspecified asthma with (acute) exacerbation: Secondary | ICD-10-CM | POA: Insufficient documentation

## 2016-12-17 DIAGNOSIS — Z8639 Personal history of other endocrine, nutritional and metabolic disease: Secondary | ICD-10-CM | POA: Insufficient documentation

## 2016-12-17 DIAGNOSIS — E213 Hyperparathyroidism, unspecified: Secondary | ICD-10-CM | POA: Insufficient documentation

## 2016-12-17 DIAGNOSIS — G43119 Migraine with aura, intractable, without status migrainosus: Secondary | ICD-10-CM

## 2016-12-17 DIAGNOSIS — R21 Rash and other nonspecific skin eruption: Secondary | ICD-10-CM | POA: Insufficient documentation

## 2016-12-17 DIAGNOSIS — E162 Hypoglycemia, unspecified: Secondary | ICD-10-CM | POA: Insufficient documentation

## 2016-12-17 DIAGNOSIS — E039 Hypothyroidism, unspecified: Secondary | ICD-10-CM | POA: Insufficient documentation

## 2016-12-17 DIAGNOSIS — L732 Hidradenitis suppurativa: Secondary | ICD-10-CM

## 2016-12-17 DIAGNOSIS — N132 Hydronephrosis with renal and ureteral calculous obstruction: Secondary | ICD-10-CM | POA: Insufficient documentation

## 2016-12-17 DIAGNOSIS — R739 Hyperglycemia, unspecified: Secondary | ICD-10-CM | POA: Insufficient documentation

## 2016-12-17 DIAGNOSIS — M25562 Pain in left knee: Secondary | ICD-10-CM | POA: Insufficient documentation

## 2016-12-17 DIAGNOSIS — Z862 Personal history of diseases of the blood and blood-forming organs and certain disorders involving the immune mechanism: Secondary | ICD-10-CM | POA: Insufficient documentation

## 2016-12-17 DIAGNOSIS — E559 Vitamin D deficiency, unspecified: Secondary | ICD-10-CM | POA: Insufficient documentation

## 2016-12-17 DIAGNOSIS — F332 Major depressive disorder, recurrent severe without psychotic features: Secondary | ICD-10-CM

## 2016-12-17 HISTORY — DX: Hydronephrosis with renal and ureteral calculous obstruction: N13.2

## 2016-12-17 HISTORY — DX: Migraine with aura, intractable, without status migrainosus: G43.119

## 2016-12-17 HISTORY — DX: Hyperparathyroidism, unspecified: E21.3

## 2016-12-17 HISTORY — DX: Hidradenitis suppurativa: L73.2

## 2016-12-17 HISTORY — DX: Calculus of kidney: N20.0

## 2016-12-17 HISTORY — DX: Carpal tunnel syndrome, left upper limb: G56.02

## 2016-12-17 HISTORY — DX: Pain in unspecified joint: M25.50

## 2016-12-17 HISTORY — DX: Rash and other nonspecific skin eruption: R21

## 2016-12-17 HISTORY — DX: Nocturia: R35.1

## 2016-12-17 HISTORY — DX: Iron deficiency anemia, unspecified: D50.9

## 2016-12-17 LAB — BASIC METABOLIC PANEL
ANION GAP: 7 (ref 5–15)
BUN: 12 mg/dL (ref 6–20)
CALCIUM: 8.9 mg/dL (ref 8.9–10.3)
CHLORIDE: 108 mmol/L (ref 101–111)
CO2: 27 mmol/L (ref 22–32)
Creatinine, Ser: 0.76 mg/dL (ref 0.44–1.00)
GFR calc non Af Amer: >60 mL/min (ref >60)
Glucose, Bld: 89 mg/dL (ref 65–99)
POTASSIUM: 4 mmol/L (ref 3.5–5.1)
Sodium: 142 mmol/L (ref 135–145)

## 2016-12-17 LAB — CBC
HEMATOCRIT: 37.6 % (ref 35.0–47.0)
HEMOGLOBIN: 12.6 g/dL (ref 12.0–16.0)
MCH: 32.2 pg (ref 26.0–34.0)
MCHC: 33.4 g/dL (ref 32.0–36.0)
MCV: 96.6 fL (ref 80.0–100.0)
Platelets: 287 10*3/uL (ref 150–440)
RBC: 3.89 MIL/uL (ref 3.80–5.20)
RDW: 17.1 % — AB (ref 11.5–14.5)
WBC: 5.8 10*3/uL (ref 3.6–11.0)

## 2016-12-17 LAB — URINALYSIS, ROUTINE W REFLEX MICROSCOPIC
Bacteria, UA: NONE SEEN
Bilirubin Urine: NEGATIVE
GLUCOSE, UA: NEGATIVE mg/dL
Hgb urine dipstick: NEGATIVE
Ketones, ur: NEGATIVE mg/dL
NITRITE: NEGATIVE
PH: 5 (ref 5.0–8.0)
Protein, ur: NEGATIVE mg/dL
RBC / HPF: NONE SEEN RBC/hpf (ref 0–5)
Specific Gravity, Urine: 1.017 (ref 1.005–1.030)
Squamous Epithelial / LPF: NONE SEEN

## 2016-12-17 NOTE — Progress Notes (Signed)
ECT consult: 46 year old woman referred from [facility] consideration of ECT. Patient interviewed. Her husband is present as well. Notes from Dr. Robina Ade also reviewed. Patient reports she has had mental health problems ever since she was a child but at age 43 started having serious suicidal thoughts. Never really found medication that worked well for any length of time. Has seen her current psychiatrist steadily for about 20 years. Patient describes current mood as being always sad or angry all of the time. Feels no motivation and no interest in doing anything. Feels hopeless about the future. Sleep is very interrupted at night. Appetite is poor. She has passive suicidal thoughts with no intent to act on it but has frequent self-mutilation attempts in which she scratches her arm. She is currently taking Symbyax, Seroquel, Wellbutrin and a reportedly low dose of Xanax. Denies homicidal ideation. Denies auditory hallucinations. Denies use of alcohol or drugs.  Social history: Married. Lives with her husband. Patient does not currently work outside the home.  Medical history: Gastric reflux symptoms, overactive bladder, rheumatoid arthritis no history of heart disease or stroke  Substance abuse history: Denies any regular drinking or drug use  Past psychiatric history: Denies ever actually trying to kill her self. Multiple self mutilations. One hospitalization in May due to what she describes as a bad reaction to lamotrigine. Multiple medicines including antidepressants and antipsychotics and mood stabilizers without stable benefit.  Family history: Father had a mood disorder and there was a possible suicide in a grandfather.  Casually dressed woman. Cooperative with interview. Good eye contact. Normal psychomotor activity. Normal speech. Affect flat and blunted. Mood stated as depressed. Thoughts lucid no sign of delusions or loosening of associations. Denies auditory hallucinations. Passive suicidal thoughts  with no intent or plan. Alert and oriented 4. Short and long-term memory intact. Good judgment and insight.  Patient is a good candidate for ECT based on a diagnosis of major depression versus bipolar depression. Nonresponse to medicine. Doesn't have any medical complications that would be a contraindication.  Patient was given information about ECT including risks and benefits. She was given opportunity to ask questions. She states a tentative agreement to a plan to begin ECT starting on Wednesday, February 21. Orders have been placed to get all the standard labs done and she is instructed to call us once that is done. I will notify the ECT staff and we will tentatively plan for a treatment with right unilateral ECT beginning on Wednesday the 21st.

## 2016-12-22 ENCOUNTER — Encounter
Admission: RE | Admit: 2016-12-22 | Discharge: 2016-12-22 | Disposition: A | Discharge status: Home / Self Care | Payer: 59 | Source: Ambulatory Visit | Attending: Psychiatry | Admitting: Psychiatry

## 2016-12-22 ENCOUNTER — Encounter: Payer: Self-pay | Admitting: Anesthesiology

## 2016-12-22 ENCOUNTER — Other Ambulatory Visit: Payer: Self-pay | Admitting: Psychiatry

## 2016-12-22 DIAGNOSIS — R413 Other amnesia: Secondary | ICD-10-CM | POA: Diagnosis not present

## 2016-12-22 DIAGNOSIS — K219 Gastro-esophageal reflux disease without esophagitis: Secondary | ICD-10-CM | POA: Insufficient documentation

## 2016-12-22 DIAGNOSIS — J45909 Unspecified asthma, uncomplicated: Secondary | ICD-10-CM | POA: Insufficient documentation

## 2016-12-22 DIAGNOSIS — Z880 Allergy status to penicillin: Secondary | ICD-10-CM | POA: Insufficient documentation

## 2016-12-22 DIAGNOSIS — M797 Fibromyalgia: Secondary | ICD-10-CM | POA: Diagnosis not present

## 2016-12-22 DIAGNOSIS — K589 Irritable bowel syndrome without diarrhea: Secondary | ICD-10-CM | POA: Insufficient documentation

## 2016-12-22 DIAGNOSIS — Z882 Allergy status to sulfonamides status: Secondary | ICD-10-CM | POA: Insufficient documentation

## 2016-12-22 DIAGNOSIS — F411 Generalized anxiety disorder: Secondary | ICD-10-CM | POA: Insufficient documentation

## 2016-12-22 DIAGNOSIS — F17210 Nicotine dependence, cigarettes, uncomplicated: Secondary | ICD-10-CM | POA: Insufficient documentation

## 2016-12-22 DIAGNOSIS — F332 Major depressive disorder, recurrent severe without psychotic features: Secondary | ICD-10-CM | POA: Insufficient documentation

## 2016-12-22 DIAGNOSIS — E892 Postprocedural hypoparathyroidism: Secondary | ICD-10-CM | POA: Insufficient documentation

## 2016-12-22 DIAGNOSIS — Z885 Allergy status to narcotic agent status: Secondary | ICD-10-CM | POA: Diagnosis not present

## 2016-12-22 DIAGNOSIS — Z888 Allergy status to other drugs, medicaments and biological substances status: Secondary | ICD-10-CM | POA: Diagnosis not present

## 2016-12-22 DIAGNOSIS — Z87442 Personal history of urinary calculi: Secondary | ICD-10-CM | POA: Diagnosis not present

## 2016-12-22 DIAGNOSIS — M069 Rheumatoid arthritis, unspecified: Secondary | ICD-10-CM | POA: Insufficient documentation

## 2016-12-22 DIAGNOSIS — Z818 Family history of other mental and behavioral disorders: Secondary | ICD-10-CM | POA: Insufficient documentation

## 2016-12-22 MED ORDER — KETOROLAC TROMETHAMINE 30 MG/ML IJ SOLN
INTRAMUSCULAR | Status: AC
  Filled 2016-12-22: qty 1

## 2016-12-22 MED ORDER — KETOROLAC TROMETHAMINE 30 MG/ML IJ SOLN
30.0000 mg | Freq: Once | INTRAMUSCULAR | Status: AC
  Administered 2016-12-22: 30 mg via INTRAVENOUS

## 2016-12-22 MED ORDER — SUCCINYLCHOLINE CHLORIDE 20 MG/ML IJ SOLN
INTRAMUSCULAR | Status: AC
  Filled 2016-12-22: qty 1

## 2016-12-22 MED ORDER — SODIUM CHLORIDE 0.9 % IV SOLN
500.0000 mL | Freq: Once | INTRAVENOUS | Status: AC
  Administered 2016-12-22: 09:00:00 via INTRAVENOUS

## 2016-12-22 MED ORDER — SODIUM CHLORIDE 0.9 % IV SOLN
INTRAVENOUS | Status: DC | PRN
  Administered 2016-12-22: 11:00:00 via INTRAVENOUS

## 2016-12-22 MED ORDER — METHOHEXITAL SODIUM 100 MG/10ML IV SOSY
PREFILLED_SYRINGE | INTRAVENOUS | Status: DC | PRN
  Administered 2016-12-22: 100 mg via INTRAVENOUS

## 2016-12-22 MED ORDER — SUCCINYLCHOLINE CHLORIDE 200 MG/10ML IV SOSY
PREFILLED_SYRINGE | INTRAVENOUS | Status: DC | PRN
  Administered 2016-12-22: 110 mg via INTRAVENOUS

## 2016-12-22 NOTE — Anesthesia Preprocedure Evaluation (Signed)
Anesthesia Evaluation  Patient identified by MRN, date of birth, ID band Patient awake    Reviewed: Allergy & Precautions, H&P , NPO status , Patient's Chart, lab work & pertinent test results, reviewed documented beta blocker date and time   History of Anesthesia Complications (+) PONV and history of anesthetic complications  Airway Mallampati: II  TM Distance: >3 FB Neck ROM: full    Dental  (+) Teeth Intact   Pulmonary neg shortness of breath, asthma , neg sleep apnea, neg COPD, neg recent URI, Current Smoker,           Cardiovascular Exercise Tolerance: Good negative cardio ROS       Neuro/Psych neg Seizures PSYCHIATRIC DISORDERS (Bipolar and anxiety)  Neuromuscular disease (fibromyalgia)    GI/Hepatic Neg liver ROS, hiatal hernia, GERD  ,  Endo/Other  negative endocrine ROS  Renal/GU Renal disease (kidney stones)  negative genitourinary   Musculoskeletal   Abdominal   Peds  Hematology  (+) Blood dyscrasia, anemia ,   Anesthesia Other Findings Past Medical History: No date: Asthma No date: Bipolar 1 disorder (HCC) No date: Fibromyalgia No date: GAD (generalized anxiety disorder) No date: GERD (gastroesophageal reflux disease) No date: Hiatal hernia No date: History of kidney stones No date: History of primary hyperparathyroidism     Comment: s/p  bilateral inferior parathyroidectomy               08-03-2010 No date: IBS (irritable bowel syndrome) No date: Left ureteral stone No date: Migraine No date: PONV (postoperative nausea and vomiting)     Comment: and claustrophobic with mask No date: RA (rheumatoid arthritis) (HCC)     Comment: dr Gavin Pound-- rheumtologist No date: Self mutilating behavior No date: Urgency of urination No date: Wears glasses   Reproductive/Obstetrics negative OB ROS                             Anesthesia Physical Anesthesia Plan  ASA:  II  Anesthesia Plan: General   Post-op Pain Management:    Induction:   Airway Management Planned:   Additional Equipment:   Intra-op Plan:   Post-operative Plan:   Informed Consent: I have reviewed the patients History and Physical, chart, labs and discussed the procedure including the risks, benefits and alternatives for the proposed anesthesia with the patient or authorized representative who has indicated his/her understanding and acceptance.   Dental Advisory Given  Plan Discussed with: Anesthesiologist, CRNA and Surgeon  Anesthesia Plan Comments:         Anesthesia Quick Evaluation

## 2016-12-22 NOTE — Transfer of Care (Signed)
Immediate Anesthesia Transfer of Care Note  Patient: Jordan Russell  Procedure(s) Performed: ECT  Patient Location: PACU  Anesthesia Type:General  Level of Consciousness: sedated  Airway & Oxygen Therapy: Patient Spontanous Breathing and Patient connected to face mask oxygen  Post-op Assessment: Report given to RN and Post -op Vital signs reviewed and stable  Post vital signs: Reviewed and stable  Last Vitals:  Vitals:   12/22/16 0828 12/22/16 1102  BP: 120/82 126/71  Pulse: 78 76  Resp: 16 16  Temp: 36.8 C (P) 36.4 C    Last Pain:  Vitals:   12/22/16 1102  TempSrc:   PainSc: Asleep         Complications: No apparent anesthesia complications

## 2016-12-22 NOTE — H&P (Signed)
Jordan Russell is an 46 y.o. female.   Chief Complaint: Patient with severe recurrent depression that has not responded to medication. Starting index course of ECT today. HPI: Recurrent severe depression. Referred by primary psychiatrist for ECT  Past Medical History:  Diagnosis Date  . Asthma   . Bipolar 1 disorder ([place])   . Fibromyalgia   . GAD (generalized anxiety disorder)   . GERD (gastroesophageal reflux disease)   . Hiatal hernia   . History of kidney stones   . History of primary hyperparathyroidism    s/p  bilateral inferior parathyroidectomy 08-03-2010  . IBS (irritable bowel syndrome)   . Left ureteral stone   . Migraine   . PONV (postoperative nausea and vomiting)    and claustrophobic with mask  . RA (rheumatoid arthritis) ([place])    dr Gavin Pound-- rheumtologist  . Self mutilating behavior   . Urgency of urination   . Wears glasses     Past Surgical History:  Procedure Laterality Date  . ABDOMINAL HYSTERECTOMY    . BALLOON DILATION N/A 10/01/2014   Procedure: BALLOON DILATION;  Surgeon: Garlan Fair, MD;  Location: [facility];  Service: Endoscopy;  Laterality: N/A;  . CYSTO/  BILATERAL RETROGRADE PYELOGRAM  11/20/2000  . CYSTO/  RIGHT RETROGRADE PYELOGRAM/  RIGHT URETEROSCOPY/  STENT PLACEMENT  12/16/2006  . CYSTOSCOPY/URETEROSCOPY/HOLMIUM LASER/STENT PLACEMENT Left 11/09/2016   Procedure: CYSTOSCOPY/URETEROSCOPY/HOLMIUM LASER/STENT PLACEMENT;  Surgeon: Nickie Retort, MD;  Location: [facility];  Service: Urology;  Laterality: Left;  90 MINS  [phone]   . ESOPHAGEAL MANOMETRY N/A 12/23/2014   Procedure: ESOPHAGEAL MANOMETRY (EM);  Surgeon: Garlan Fair, MD;  Location: [facility];  Service: Endoscopy;  Laterality: N/A;  . ESOPHAGOGASTRODUODENOSCOPY  last one 03-28-2015  . ESOPHAGOGASTRODUODENOSCOPY (EGD) WITH PROPOFOL N/A 10/01/2014   Procedure: ESOPHAGOGASTRODUODENOSCOPY (EGD) WITH PROPOFOL;  Surgeon: Garlan Fair, MD;   Location: [facility];  Service: Endoscopy;  Laterality: N/A;  . EXTRACORPOREAL SHOCK WAVE LITHOTRIPSY  multiple times since age 11  . HOLMIUM LASER APPLICATION Left [id number]   Procedure: HOLMIUM LASER APPLICATION;  Surgeon: Nickie Retort, MD;  Location[facility];  Service: Urology;  Laterality: Left;  . KNEE ARTHROSCOPY Right 03-04-2014  [facility]   w/ Arthrotomy  . LAPAROSCOPIC ASSISTED VAGINAL HYSTERECTOMY  12/28/2005  . LAPAROSCOPIC CHOLECYSTECTOMY  01/17/2004  . LAPAROSCOPY LEFT OVARIAN CYSTECTOMY/  BILATERAL TUBAL LIGATION  02/26/2003  . LEFT URETEROSCOPIC STONE EXTRACTION /  STENT PLACEMENT  07/09/2002  . NECK EXPLORATION/  BILATERAL INFERIOR PARATHYROIDECTOMY  08/03/2010  . RIGHT URETERAL DILATION/  URETEROSCOPIC STONE EXTRACTION  06/12/2010  . ROUX-EN-Y GASTRIC BYPASS  2001  . SOLYX TRANSURETHRAL SLING/  POSTERIOR PELVIC FLOOR SACROSPINOUS REPAIR  02/21/2009   and Cysto/  Bilateral ureteral stent placement  . TONSILLECTOMY AND ADENOIDECTOMY    . WRIST GANGLION EXCISION Right 01/28/2000    Family History  Problem Relation Age of Onset  . Bipolar disorder Son   . Anxiety disorder Brother   . Depression Brother    Social History:  reports that she has been smoking Cigarettes.  She has a 5.00 pack-year smoking history. She has never used smokeless tobacco. She reports that she does not drink alcohol or use drugs.  Allergies:  Allergies  Allergen Reactions  . Lamictal [Lamotrigine] Nausea And Vomiting and Other (See Comments)    Tremors, diplopia  . Penicillins Hives and Itching    As a child, "breathing, itching problem" Has patient had a PCN reaction  causing immediate rash, facial/tongue/throat swelling, SOB or lightheadedness with hypotension: Yes Has patient had a PCN reaction causing severe rash involving mucus membranes or skin necrosis: Yes Has patient had a PCN reaction that required hospitalization No Has patient had a PCN reaction occurring  within the last 10 years: No If all of the above answers are "NO", then may proceed with Cephalosporin use.   . Sulfa Antibiotics Hives  . Morphine And Related Itching and Rash     (Not in a hospital admission)  No results found for this or any previous visit (from the past 48 hour(s)). No results found.  Review of Systems  Constitutional: Negative.   HENT: Negative.   Eyes: Negative.   Respiratory: Negative.   Cardiovascular: Negative.   Gastrointestinal: Negative.   Musculoskeletal: Negative.   Skin: Negative.   Neurological: Negative.   Psychiatric/Behavioral: Positive for depression. Negative for hallucinations, memory loss, substance abuse and suicidal ideas. The patient is nervous/anxious and has insomnia.     Blood pressure 120/82, pulse 78, temperature 98.3 F (36.8 C), temperature source Oral, resp. rate 16, height 5\' 7"  (1.702 m), weight 103 kg (227 lb), SpO2 99 %. Physical Exam  Nursing note and vitals reviewed. Constitutional: She appears well-developed and well-nourished.  HENT:  Head: Normocephalic and atraumatic.  Eyes: Conjunctivae are normal. Pupils are equal, round, and reactive to light.  Neck: Normal range of motion.  Cardiovascular: Regular rhythm and normal heart sounds.   Respiratory: Effort normal. No respiratory distress.  GI: Soft.  Musculoskeletal: Normal range of motion.  Neurological: She is alert.  Skin: Skin is warm and dry.  Psychiatric: Judgment normal. Her mood appears anxious. Her speech is delayed. She is slowed. Cognition and memory are normal. She exhibits a depressed mood. She expresses no suicidal plans.     Assessment/Plan Beginning index course with first treatment today right unilateral.  [name], MD 12/22/2016, 10:44 AM

## 2016-12-22 NOTE — Procedures (Signed)
ECT SERVICES Physician's Interval Evaluation & Treatment Note  Patient Identification: Jordan Russell MRN:  [id number] Date of Evaluation:  12/22/2016 TX #: 1  MADRS: 38  MMSE: 30  P.E. Findings:  Physical exam unremarkable vital signs normal heart and lungs normal  Psychiatric Interval Note:  Mood depressed. Lucid. Able to make reasonable decisions  Subjective:  Patient is a 46 y.o. female seen for evaluation for Electroconvulsive Therapy. Depression  Treatment Summary:   [x]   Right Unilateral             []  Bilateral   % Energy : 0.3 ms 50%   Impedance: 1840 ohms  Seizure Energy Index: 30,567 V squared  Postictal Suppression Index: 90%  Seizure Concordance Index: 98%  Medications  Pre Shock: Toradol 30 mg Brevital 100 mg succinylcholine 110 mg  Post Shock:    Seizure Duration: 38 seconds by EMG 52 seconds by EEG   Comments: Follow-up Friday beginning of index course   Lungs:  [x]   Clear to auscultation               []  Other:   Heart:    [x]   Regular rhythm             []  irregular rhythm    [x]   Previous H&P reviewed, patient examined and there are NO CHANGES                 []   Previous H&P reviewed, patient examined and there are changes noted.   [name], MD 2/21/201810:45 AM

## 2016-12-22 NOTE — Anesthesia Post-op Follow-up Note (Cosign Needed)
Anesthesia QCDR form completed.        

## 2016-12-22 NOTE — Discharge Instructions (Signed)
1)  The drugs that you have been given will stay in your system until tomorrow so for the       next 24 hours you should not:  A. Drive an automobile  B. Make any legal decisions  C. Drink any alcoholic beverages  2)  You may resume your regular meals upon return home.  3)  A responsible adult must take you home.  Someone should stay with you for a few          hours, then be available by phone for the remainder of the treatment day.  4)  You May experience any of the following symptoms:  Headache, Nausea and a dry mouth (due to the medications you were given),  temporary memory loss and some confusion, or sore muscles (a warm bath  should help this).  If you you experience any of these symptoms let us know on                your return visit.  5)  Report any of the following: any acute discomfort, severe headache, or temperature        greater than 100.5 F.   Also report any unusual redness, swelling, drainage, or pain         at your IV site.    You may report Symptoms to:  [facility]          Phone: [phone], ECT Department           or Dr. Prescott Gum office [phone]  6)  Your next ECT Treatment is Friday December 24, 2016 at 8:30   We will call 2 days prior to your scheduled appointment for arrival times.  7)  Nothing to eat or drink after midnight the night before your procedure.  8)  Take     With a sip of water the morning of your procedure.  9)  Other Instructions: Call [phone] to cancel the morning of your procedure due         to illness or emergency.  10) We will call within 72 hours to assess how you are feeling.

## 2016-12-22 NOTE — Anesthesia Procedure Notes (Signed)
Date/Time: 12/22/2016 10:52 AM Performed by: Dionne Bucy Pre-anesthesia Checklist: Patient identified, Emergency Drugs available, Suction available and Patient being monitored Patient Re-evaluated:Patient Re-evaluated prior to inductionOxygen Delivery Method: Circle system utilized Preoxygenation: Pre-oxygenation with 100% oxygen Intubation Type: IV induction Ventilation: Mask ventilation without difficulty and Mask ventilation throughout procedure Airway Equipment and Method: Bite block Placement Confirmation: positive ETCO2 Dental Injury: Teeth and Oropharynx as per pre-operative assessment

## 2016-12-23 ENCOUNTER — Telehealth (HOSPITAL_COMMUNITY): Payer: Self-pay | Admitting: *Deleted

## 2016-12-23 NOTE — Telephone Encounter (Signed)
Called for prior authorization of ECT. Spoke with Benjamine Mola who gave 15 sessions from 12/23/16 good for 1 year. Auth # [id number]. For future authorizations call [phone]. [phone]

## 2016-12-24 ENCOUNTER — Encounter: Payer: Self-pay | Admitting: Anesthesiology

## 2016-12-24 ENCOUNTER — Ambulatory Visit
Admission: RE | Admit: 2016-12-24 | Discharge: 2016-12-24 | Disposition: A | Discharge status: Home / Self Care | Payer: 59 | Source: Ambulatory Visit | Attending: Psychiatry | Admitting: Psychiatry

## 2016-12-24 ENCOUNTER — Other Ambulatory Visit: Payer: Self-pay | Admitting: Psychiatry

## 2016-12-24 DIAGNOSIS — Z87442 Personal history of urinary calculi: Secondary | ICD-10-CM | POA: Insufficient documentation

## 2016-12-24 DIAGNOSIS — F339 Major depressive disorder, recurrent, unspecified: Secondary | ICD-10-CM | POA: Diagnosis not present

## 2016-12-24 DIAGNOSIS — J45909 Unspecified asthma, uncomplicated: Secondary | ICD-10-CM | POA: Diagnosis not present

## 2016-12-24 DIAGNOSIS — M797 Fibromyalgia: Secondary | ICD-10-CM | POA: Diagnosis not present

## 2016-12-24 DIAGNOSIS — M069 Rheumatoid arthritis, unspecified: Secondary | ICD-10-CM | POA: Insufficient documentation

## 2016-12-24 DIAGNOSIS — F17210 Nicotine dependence, cigarettes, uncomplicated: Secondary | ICD-10-CM | POA: Diagnosis not present

## 2016-12-24 DIAGNOSIS — F319 Bipolar disorder, unspecified: Secondary | ICD-10-CM | POA: Insufficient documentation

## 2016-12-24 DIAGNOSIS — K589 Irritable bowel syndrome without diarrhea: Secondary | ICD-10-CM | POA: Diagnosis not present

## 2016-12-24 DIAGNOSIS — F332 Major depressive disorder, recurrent severe without psychotic features: Secondary | ICD-10-CM

## 2016-12-24 DIAGNOSIS — Z9884 Bariatric surgery status: Secondary | ICD-10-CM | POA: Insufficient documentation

## 2016-12-24 DIAGNOSIS — F411 Generalized anxiety disorder: Secondary | ICD-10-CM | POA: Insufficient documentation

## 2016-12-24 DIAGNOSIS — K219 Gastro-esophageal reflux disease without esophagitis: Secondary | ICD-10-CM | POA: Insufficient documentation

## 2016-12-24 DIAGNOSIS — K449 Diaphragmatic hernia without obstruction or gangrene: Secondary | ICD-10-CM | POA: Insufficient documentation

## 2016-12-24 MED ORDER — MIDAZOLAM HCL 2 MG/2ML IJ SOLN
INTRAMUSCULAR | Status: AC
  Administered 2016-12-24: 2 mg via INTRAVENOUS
  Filled 2016-12-24: qty 2

## 2016-12-24 MED ORDER — KETOROLAC TROMETHAMINE 30 MG/ML IJ SOLN
INTRAMUSCULAR | Status: AC
  Filled 2016-12-24: qty 1

## 2016-12-24 MED ORDER — KETOROLAC TROMETHAMINE 30 MG/ML IJ SOLN
30.0000 mg | Freq: Once | INTRAMUSCULAR | Status: AC
  Administered 2016-12-24: 30 mg via INTRAVENOUS

## 2016-12-24 MED ORDER — SUCCINYLCHOLINE CHLORIDE 20 MG/ML IJ SOLN
INTRAMUSCULAR | Status: AC
Start: 2016-12-24 — End: 2016-12-24
  Filled 2016-12-24: qty 1

## 2016-12-24 MED ORDER — MIDAZOLAM HCL 2 MG/2ML IJ SOLN
2.0000 mg | Freq: Once | INTRAMUSCULAR | Status: AC
  Administered 2016-12-24: 2 mg via INTRAVENOUS

## 2016-12-24 MED ORDER — SUCCINYLCHOLINE CHLORIDE 200 MG/10ML IV SOSY
PREFILLED_SYRINGE | INTRAVENOUS | Status: DC | PRN
  Administered 2016-12-24: 110 mg via INTRAVENOUS

## 2016-12-24 MED ORDER — SODIUM CHLORIDE 0.9 % IV SOLN
INTRAVENOUS | Status: DC | PRN
  Administered 2016-12-24: 12:00:00 via INTRAVENOUS

## 2016-12-24 MED ORDER — SODIUM CHLORIDE 0.9 % IV SOLN
500.0000 mL | Freq: Once | INTRAVENOUS | Status: AC
  Administered 2016-12-24: 1000 mL via INTRAVENOUS

## 2016-12-24 MED ORDER — METHOHEXITAL SODIUM 100 MG/10ML IV SOSY
PREFILLED_SYRINGE | INTRAVENOUS | Status: DC | PRN
  Administered 2016-12-24: 100 mg via INTRAVENOUS

## 2016-12-24 NOTE — Anesthesia Preprocedure Evaluation (Signed)
Anesthesia Evaluation  Patient identified by MRN, date of birth, ID band Patient awake    Reviewed: Allergy & Precautions, H&P , NPO status , Patient's Chart, lab work & pertinent test results, reviewed documented beta blocker date and time   History of Anesthesia Complications (+) PONV and history of anesthetic complications  Airway Mallampati: II  TM Distance: >3 FB Neck ROM: full    Dental  (+) Poor Dentition, Chipped   Pulmonary neg shortness of breath, asthma , neg sleep apnea, neg COPD, neg recent URI, Current Smoker,           Cardiovascular Exercise Tolerance: Good + Peripheral Vascular Disease  negative cardio ROS       Neuro/Psych  Headaches, neg Seizures PSYCHIATRIC DISORDERS (Bipolar and anxiety) Anxiety Bipolar Disorder  Neuromuscular disease (fibromyalgia)    GI/Hepatic Neg liver ROS, hiatal hernia, GERD  ,  Endo/Other  negative endocrine ROS  Renal/GU Renal disease (kidney stones)  negative genitourinary   Musculoskeletal  (+) Arthritis , Fibromyalgia -  Abdominal   Peds  Hematology  (+) Blood dyscrasia, anemia ,   Anesthesia Other Findings Past Medical History: No date: Asthma No date: Bipolar 1 disorder (HCC) No date: Fibromyalgia No date: GAD (generalized anxiety disorder) No date: GERD (gastroesophageal reflux disease) No date: Hiatal hernia No date: History of kidney stones No date: History of primary hyperparathyroidism     Comment: s/p  bilateral inferior parathyroidectomy               08-03-2010 No date: IBS (irritable bowel syndrome) No date: Left ureteral stone No date: Migraine No date: PONV (postoperative nausea and vomiting)     Comment: and claustrophobic with mask No date: RA (rheumatoid arthritis) (HCC)     Comment: dr Gavin Pound-- rheumtologist No date: Self mutilating behavior No date: Urgency of urination No date: Wears glasses   Reproductive/Obstetrics negative  OB ROS                             Anesthesia Physical  Anesthesia Plan  ASA: III  Anesthesia Plan: General   Post-op Pain Management:    Induction:   Airway Management Planned:   Additional Equipment:   Intra-op Plan:   Post-operative Plan:   Informed Consent: I have reviewed the patients History and Physical, chart, labs and discussed the procedure including the risks, benefits and alternatives for the proposed anesthesia with the patient or authorized representative who has indicated his/her understanding and acceptance.   Dental Advisory Given  Plan Discussed with: Anesthesiologist, CRNA and Surgeon  Anesthesia Plan Comments:         Anesthesia Quick Evaluation

## 2016-12-24 NOTE — Anesthesia Postprocedure Evaluation (Signed)
Anesthesia Post Note  Patient: Jordan Russell  Procedure(s) Performed: * No procedures listed *  Patient location during evaluation: PACU Anesthesia Type: General Level of consciousness: awake and alert Pain management: pain level controlled Vital Signs Assessment: post-procedure vital signs reviewed and stable Respiratory status: spontaneous breathing, nonlabored ventilation, respiratory function stable and patient connected to nasal cannula oxygen Cardiovascular status: blood pressure returned to baseline and stable Postop Assessment: no signs of nausea or vomiting Anesthetic complications: no     Last Vitals:  Vitals:   12/24/16 1242 12/24/16 1311  BP: 102/62 129/74  Pulse: 76 62  Resp: 15 18  Temp: 36.7 C     Last Pain:  Vitals:   12/24/16 0921  TempSrc: Oral                 Precious Haws Palyn Scrima

## 2016-12-24 NOTE — Anesthesia Post-op Follow-up Note (Cosign Needed)
Anesthesia QCDR form completed.        

## 2016-12-24 NOTE — Anesthesia Procedure Notes (Signed)
Date/Time: 12/24/2016 12:01 PM Performed by: Dionne Bucy Pre-anesthesia Checklist: Patient identified, Emergency Drugs available, Suction available and Patient being monitored Patient Re-evaluated:Patient Re-evaluated prior to inductionOxygen Delivery Method: Circle system utilized Preoxygenation: Pre-oxygenation with 100% oxygen Intubation Type: IV induction Ventilation: Mask ventilation without difficulty and Mask ventilation throughout procedure Airway Equipment and Method: Bite block Placement Confirmation: positive ETCO2 Dental Injury: Teeth and Oropharynx as per pre-operative assessment

## 2016-12-24 NOTE — H&P (Signed)
Jordan Russell is an 46 y.o. female.   Chief Complaint: ongoing depression HPI: recurrent depression without response to medication  Past Medical History:  Diagnosis Date  . Asthma   . Bipolar 1 disorder ([place])   . Fibromyalgia   . GAD (generalized anxiety disorder)   . GERD (gastroesophageal reflux disease)   . Hiatal hernia   . History of kidney stones   . History of primary hyperparathyroidism    s/p  bilateral inferior parathyroidectomy 08-03-2010  . IBS (irritable bowel syndrome)   . Left ureteral stone   . Migraine   . PONV (postoperative nausea and vomiting)    and claustrophobic with mask  . RA (rheumatoid arthritis) ([place])    dr Gavin Pound-- rheumtologist  . Self mutilating behavior   . Urgency of urination   . Wears glasses     Past Surgical History:  Procedure Laterality Date  . ABDOMINAL HYSTERECTOMY    . BALLOON DILATION N/A 10/01/2014   Procedure: BALLOON DILATION;  Surgeon: Garlan Fair, MD;  Location: [facility];  Service: Endoscopy;  Laterality: N/A;  . CYSTO/  BILATERAL RETROGRADE PYELOGRAM  11/20/2000  . CYSTO/  RIGHT RETROGRADE PYELOGRAM/  RIGHT URETEROSCOPY/  STENT PLACEMENT  12/16/2006  . CYSTOSCOPY/URETEROSCOPY/HOLMIUM LASER/STENT PLACEMENT Left 11/09/2016   Procedure: CYSTOSCOPY/URETEROSCOPY/HOLMIUM LASER/STENT PLACEMENT;  Surgeon: Nickie Retort, MD;  Location: [facility];  Service: Urology;  Laterality: Left;  90 MINS  [phone]   . ESOPHAGEAL MANOMETRY N/A 12/23/2014   Procedure: ESOPHAGEAL MANOMETRY (EM);  Surgeon: Garlan Fair, MD;  Location: [facility];  Service: Endoscopy;  Laterality: N/A;  . ESOPHAGOGASTRODUODENOSCOPY  last one 03-28-2015  . ESOPHAGOGASTRODUODENOSCOPY (EGD) WITH PROPOFOL N/A 10/01/2014   Procedure: ESOPHAGOGASTRODUODENOSCOPY (EGD) WITH PROPOFOL;  Surgeon: Garlan Fair, MD;  Location: [facility];  Service: Endoscopy;  Laterality: N/A;  . EXTRACORPOREAL SHOCK WAVE LITHOTRIPSY   multiple times since age 73  . HOLMIUM LASER APPLICATION Left [id number]   Procedure: HOLMIUM LASER APPLICATION;  Surgeon: Nickie Retort, MD;  Location: [facility];  Service: Urology;  Laterality: Left;  . KNEE ARTHROSCOPY Right 03-04-2014  [facility]   w/ Arthrotomy  . LAPAROSCOPIC ASSISTED VAGINAL HYSTERECTOMY  12/28/2005  . LAPAROSCOPIC CHOLECYSTECTOMY  01/17/2004  . LAPAROSCOPY LEFT OVARIAN CYSTECTOMY/  BILATERAL TUBAL LIGATION  02/26/2003  . LEFT URETEROSCOPIC STONE EXTRACTION /  STENT PLACEMENT  07/09/2002  . NECK EXPLORATION/  BILATERAL INFERIOR PARATHYROIDECTOMY  08/03/2010  . RIGHT URETERAL DILATION/  URETEROSCOPIC STONE EXTRACTION  06/12/2010  . ROUX-EN-Y GASTRIC BYPASS  2001  . SOLYX TRANSURETHRAL SLING/  POSTERIOR PELVIC FLOOR SACROSPINOUS REPAIR  02/21/2009   and Cysto/  Bilateral ureteral stent placement  . TONSILLECTOMY AND ADENOIDECTOMY    . WRIST GANGLION EXCISION Right 01/28/2000    Family History  Problem Relation Age of Onset  . Bipolar disorder Son   . Anxiety disorder Brother   . Depression Brother    Social History:  reports that she has been smoking Cigarettes.  She has a 5.00 pack-year smoking history. She has never used smokeless tobacco. She reports that she does not drink alcohol or use drugs.  Allergies:  Allergies  Allergen Reactions  . Lamictal [Lamotrigine] Nausea And Vomiting and Other (See Comments)    Tremors, diplopia  . Penicillins Hives and Itching    As a child, "breathing, itching problem" Has patient had a PCN reaction causing immediate rash, facial/tongue/throat swelling, SOB or lightheadedness with hypotension: Yes Has patient had a PCN reaction causing  severe rash involving mucus membranes or skin necrosis: Yes Has patient had a PCN reaction that required hospitalization No Has patient had a PCN reaction occurring within the last 10 years: No If all of the above answers are "NO", then may proceed with Cephalosporin use.    . Sulfa Antibiotics Hives  . Morphine And Related Itching and Rash     (Not in a hospital admission)  No results found for this or any previous visit (from the past 48 hour(s)). No results found.  Review of Systems  Constitutional: Negative.   HENT: Negative.   Eyes: Negative.   Respiratory: Negative.   Cardiovascular: Negative.   Gastrointestinal: Negative.   Musculoskeletal: Negative.   Skin: Negative.   Neurological: Negative.   Psychiatric/Behavioral: Positive for depression. Negative for hallucinations, memory loss, substance abuse and suicidal ideas. The patient is not nervous/anxious and does not have insomnia.     Blood pressure 121/66, pulse 81, temperature 98 F (36.7 C), temperature source Oral, resp. rate 18, height 5\' 7"  (1.702 m), weight 101.6 kg (224 lb), SpO2 99 %. Physical Exam  Nursing note and vitals reviewed. Constitutional: She appears well-developed and well-nourished.  HENT:  Head: Normocephalic and atraumatic.  Eyes: Conjunctivae are normal. Pupils are equal, round, and reactive to light.  Neck: Normal range of motion.  Cardiovascular: Regular rhythm and normal heart sounds.   Respiratory: Effort normal.  GI: Soft.  Musculoskeletal: Normal range of motion.  Neurological: She is alert.  Skin: Skin is warm and dry.  Psychiatric: Judgment normal. Her speech is delayed. She is slowed. Cognition and memory are normal. She exhibits a depressed mood. She expresses no suicidal ideation.     Assessment/Plan Cont rul ect mwf schedule  [name], MD 12/24/2016, 11:54 AM

## 2016-12-24 NOTE — Discharge Instructions (Signed)
1)  The drugs that you have been given will stay in your system until tomorrow so for the       next 24 hours you should not:  A. Drive an automobile  B. Make any legal decisions  C. Drink any alcoholic beverages  2)  You may resume your regular meals upon return home.  3)  A responsible adult must take you home.  Someone should stay with you for a few          hours, then be available by phone for the remainder of the treatment day.  4)  You May experience any of the following symptoms:  Headache, Nausea and a dry mouth (due to the medications you were given),  temporary memory loss and some confusion, or sore muscles (a warm bath  should help this).  If you you experience any of these symptoms let us know on                your return visit.  5)  Report any of the following: any acute discomfort, severe headache, or temperature        greater than 100.5 F.   Also report any unusual redness, swelling, drainage, or pain         at your IV site.    You may report Symptoms to:  [place] [facility]          Phone: [phone], ECT Department           or Dr. Prescott Gum office [phone]  6)  Your next ECT Treatment is Day Monday Date December 27, 2016 at 815am  We will call 2 days prior to your scheduled appointment for arrival times.  7)  Nothing to eat or drink after midnight the night before your procedure.  8)  Take .   With a sip of water the morning of your procedure.  9)  Other Instructions: Call [phone] to cancel the morning of your procedure due         to illness or emergency.  10) We will call within 72 hours to assess how you are feeling.

## 2016-12-24 NOTE — Transfer of Care (Signed)
Immediate Anesthesia Transfer of Care Note  Patient: Jordan Russell  Procedure(s) Performed: ECT  Patient Location: PACU  Anesthesia Type:General  Level of Consciousness: sedated  Airway & Oxygen Therapy: Patient Spontanous Breathing and Patient connected to face mask oxygen  Post-op Assessment: Report given to RN and Post -op Vital signs reviewed and stable  Post vital signs: Reviewed and stable  Last Vitals:  Vitals:   12/24/16 0921 12/24/16 1212  BP: 121/66 102/86  Pulse: 81 95  Resp: 18 (!) 24  Temp: 36.7 C 36.4 C    Last Pain:  Vitals:   12/24/16 0921  TempSrc: Oral         Complications: No apparent anesthesia complications

## 2016-12-24 NOTE — Procedures (Signed)
ECT SERVICES Physician's Interval Evaluation & Treatment Note  Patient Identification: LUCYNDA KIL MRN:  [id number] Date of Evaluation:  12/24/2016 TX #: 2  MADRS:   MMSE:   P.E. Findings:  No change to pe  Psychiatric Interval Note:  Mood depressed  Subjective:  Patient is a 46 y.o. female seen for evaluation for Electroconvulsive Therapy. depression  Treatment Summary:   [x]   Right Unilateral             []  Bilateral   % Energy : 0.62ms 50%   Impedance: 2120 ohms  Seizure Energy Index: 29861 microvolts squared  Postictal Suppression Index: 87%  Seizure Concordance Index: 97%  Medications  Pre Shock: tor 30mg , brev 100mg , suc 110mg   Post Shock:    Seizure Duration: emg 32s, eeg 55s   Comments: Follow up monday   Lungs:  [x]   Clear to auscultation               []  Other:   Heart:    [x]   Regular rhythm             []  irregular rhythm    [x]   Previous H&P reviewed, patient examined and there are NO CHANGES                 []   Previous H&P reviewed, patient examined and there are changes noted.   [name], MD 2/23/201811:56 AM

## 2016-12-24 NOTE — Anesthesia Postprocedure Evaluation (Signed)
Anesthesia Post Note  Patient: MARTICIA PUSATERI  Procedure(s) Performed: * No procedures listed *  Patient location during evaluation: PACU Anesthesia Type: General Level of consciousness: awake and alert Pain management: pain level controlled Vital Signs Assessment: post-procedure vital signs reviewed and stable Respiratory status: spontaneous breathing, nonlabored ventilation, respiratory function stable and patient connected to nasal cannula oxygen Cardiovascular status: blood pressure returned to baseline and stable Postop Assessment: no signs of nausea or vomiting Anesthetic complications: no     Last Vitals:  Vitals:   12/22/16 1122 12/22/16 1128  BP: 117/77 116/82  Pulse: 77 77  Resp: 11 12  Temp:  36.4 C    Last Pain:  Vitals:   12/22/16 1128  TempSrc: Oral  PainSc:                  Martha Clan

## 2016-12-27 ENCOUNTER — Encounter: Payer: Self-pay | Admitting: Anesthesiology

## 2016-12-27 ENCOUNTER — Encounter
Admission: RE | Admit: 2016-12-27 | Discharge: 2016-12-27 | Disposition: A | Discharge status: Home / Self Care | Payer: 59 | Source: Ambulatory Visit | Attending: Psychiatry | Admitting: Psychiatry

## 2016-12-27 ENCOUNTER — Other Ambulatory Visit: Payer: Self-pay | Admitting: Psychiatry

## 2016-12-27 DIAGNOSIS — F332 Major depressive disorder, recurrent severe without psychotic features: Secondary | ICD-10-CM | POA: Diagnosis not present

## 2016-12-27 DIAGNOSIS — Z888 Allergy status to other drugs, medicaments and biological substances status: Secondary | ICD-10-CM | POA: Diagnosis not present

## 2016-12-27 DIAGNOSIS — Z818 Family history of other mental and behavioral disorders: Secondary | ICD-10-CM | POA: Insufficient documentation

## 2016-12-27 DIAGNOSIS — Z880 Allergy status to penicillin: Secondary | ICD-10-CM | POA: Diagnosis not present

## 2016-12-27 DIAGNOSIS — Z885 Allergy status to narcotic agent status: Secondary | ICD-10-CM | POA: Insufficient documentation

## 2016-12-27 DIAGNOSIS — E892 Postprocedural hypoparathyroidism: Secondary | ICD-10-CM | POA: Insufficient documentation

## 2016-12-27 DIAGNOSIS — K219 Gastro-esophageal reflux disease without esophagitis: Secondary | ICD-10-CM | POA: Insufficient documentation

## 2016-12-27 DIAGNOSIS — M797 Fibromyalgia: Secondary | ICD-10-CM | POA: Diagnosis not present

## 2016-12-27 DIAGNOSIS — M069 Rheumatoid arthritis, unspecified: Secondary | ICD-10-CM | POA: Insufficient documentation

## 2016-12-27 DIAGNOSIS — Z882 Allergy status to sulfonamides status: Secondary | ICD-10-CM | POA: Insufficient documentation

## 2016-12-27 DIAGNOSIS — F411 Generalized anxiety disorder: Secondary | ICD-10-CM | POA: Diagnosis not present

## 2016-12-27 DIAGNOSIS — K589 Irritable bowel syndrome without diarrhea: Secondary | ICD-10-CM | POA: Insufficient documentation

## 2016-12-27 DIAGNOSIS — J45909 Unspecified asthma, uncomplicated: Secondary | ICD-10-CM | POA: Insufficient documentation

## 2016-12-27 DIAGNOSIS — Z87442 Personal history of urinary calculi: Secondary | ICD-10-CM | POA: Diagnosis not present

## 2016-12-27 DIAGNOSIS — F17210 Nicotine dependence, cigarettes, uncomplicated: Secondary | ICD-10-CM | POA: Insufficient documentation

## 2016-12-27 MED ORDER — KETOROLAC TROMETHAMINE 30 MG/ML IJ SOLN
INTRAMUSCULAR | Status: AC
  Filled 2016-12-27: qty 1

## 2016-12-27 MED ORDER — SUCCINYLCHOLINE CHLORIDE 20 MG/ML IJ SOLN
INTRAMUSCULAR | Status: DC | PRN
  Administered 2016-12-27: 110 mg via INTRAVENOUS

## 2016-12-27 MED ORDER — SODIUM CHLORIDE 0.9 % IV SOLN
500.0000 mL | Freq: Once | INTRAVENOUS | Status: AC
  Administered 2016-12-27: 09:00:00 via INTRAVENOUS

## 2016-12-27 MED ORDER — MIDAZOLAM HCL 2 MG/2ML IJ SOLN: 2.0000 mg | Freq: Once | INTRAMUSCULAR | Status: DC

## 2016-12-27 MED ORDER — KETOROLAC TROMETHAMINE 30 MG/ML IJ SOLN
30.0000 mg | Freq: Once | INTRAMUSCULAR | Status: AC
  Administered 2016-12-27: 30 mg via INTRAVENOUS

## 2016-12-27 MED ORDER — METHOHEXITAL SODIUM 0.5 G IJ SOLR
INTRAMUSCULAR | Status: AC
  Filled 2016-12-27: qty 500

## 2016-12-27 MED ORDER — SODIUM CHLORIDE 0.9 % IV SOLN
INTRAVENOUS | Status: DC | PRN
  Administered 2016-12-27: 10:00:00 via INTRAVENOUS

## 2016-12-27 MED ORDER — SUCCINYLCHOLINE CHLORIDE 20 MG/ML IJ SOLN
INTRAMUSCULAR | Status: AC
  Filled 2016-12-27: qty 1

## 2016-12-27 MED ORDER — METHOHEXITAL SODIUM 100 MG/10ML IV SOSY
PREFILLED_SYRINGE | INTRAVENOUS | Status: DC | PRN
  Administered 2016-12-27: 100 mg via INTRAVENOUS

## 2016-12-27 MED ORDER — MIDAZOLAM HCL 2 MG/2ML IJ SOLN
INTRAMUSCULAR | Status: AC
  Filled 2016-12-27: qty 2

## 2016-12-27 NOTE — Anesthesia Post-op Follow-up Note (Cosign Needed)
Anesthesia QCDR form completed.        

## 2016-12-27 NOTE — Transfer of Care (Signed)
Immediate Anesthesia Transfer of Care Note  Patient: Jordan Russell  Procedure(s) Performed: * No procedures listed *  Patient Location: PACU  Anesthesia Type:General  Level of Consciousness: patient cooperative and lethargic  Airway & Oxygen Therapy: Patient Spontanous Breathing and Patient connected to face mask oxygen  Post-op Assessment: Report given to RN and Post -op Vital signs reviewed and stable  Post vital signs: Reviewed and stable  Last Vitals:  Vitals:   12/27/16 0834 12/27/16 1036  BP: 125/83 111/67  Pulse: 75 83  Resp: 16 16  Temp: 36.9 C 36.3 C    Last Pain:  Vitals:   12/27/16 0834  TempSrc: Oral         Complications: No apparent anesthesia complications

## 2016-12-27 NOTE — Procedures (Signed)
ECT SERVICES Physician's Interval Evaluation & Treatment Note  Patient Identification: Jordan Russell MRN:  [id number] Date of Evaluation:  12/27/2016 TX #: 3  MADRS:   MMSE:   P.E. Findings:  No change to physical exam  Psychiatric Interval Note:  Affect is appropriate and normal. Subjectively noticing memory impairment. Does not appear to be delirious her cognitively impaired to gross exam  Subjective:  Patient is a 46 y.o. female seen for evaluation for Electroconvulsive Therapy. As noticed above. She is having some memory impairment  Treatment Summary:   [x]   Right Unilateral             []  Bilateral   % Energy : 0.3 ms 50%   Impedance: 1220 ohms  Seizure Energy Index: 25,239 V squared  Postictal Suppression Index: 93%  Seizure Concordance Index: 93%  Medications  Pre Shock: Toradol 30 mg Brevital 100 mg succinylcholine 110 mg  Post Shock:    Seizure Duration: 15 seconds by EMG, 59 seconds by EEG   Comments: Follow-up Wednesday   Lungs:  [x]   Clear to auscultation               []  Other:   Heart:    [x]   Regular rhythm             []  irregular rhythm    [x]   Previous H&P reviewed, patient examined and there are NO CHANGES                 []   Previous H&P reviewed, patient examined and there are changes noted.   Jordan Berthold, MD 2/26/201810:17 AM

## 2016-12-27 NOTE — Anesthesia Postprocedure Evaluation (Signed)
Anesthesia Post Note  Patient: Jordan Russell  Procedure(s) Performed: * No procedures listed *  Patient location during evaluation: PACU Anesthesia Type: General Level of consciousness: awake and alert Pain management: pain level controlled Vital Signs Assessment: post-procedure vital signs reviewed and stable Respiratory status: spontaneous breathing, nonlabored ventilation, respiratory function stable and patient connected to nasal cannula oxygen Cardiovascular status: blood pressure returned to baseline and stable Postop Assessment: no signs of nausea or vomiting Anesthetic complications: no     Last Vitals:  Vitals:   12/27/16 1036 12/27/16 1119  BP: 111/67 108/69  Pulse: 83 81  Resp: 16 18  Temp: 36.3 C     Last Pain:  Vitals:   12/27/16 1046  TempSrc:   PainSc: 0-No pain                 Precious Haws [place]

## 2016-12-27 NOTE — H&P (Signed)
Jordan Russell is an 46 y.o. female.   Chief Complaint: Has noticed some memory problems. Short-term memory significantly noticeable. Also feeling jittery. On the other hand some improvement in mood HPI: History of recurrent severe depression  Past Medical History:  Diagnosis Date  . Asthma   . Bipolar 1 disorder ([place])   . Fibromyalgia   . GAD (generalized anxiety disorder)   . GERD (gastroesophageal reflux disease)   . Hiatal hernia   . History of kidney stones   . History of primary hyperparathyroidism    s/p  bilateral inferior parathyroidectomy 08-03-2010  . IBS (irritable bowel syndrome)   . Left ureteral stone   . Migraine   . PONV (postoperative nausea and vomiting)    and claustrophobic with mask  . RA (rheumatoid arthritis) ([place])    dr Gavin Pound-- rheumtologist  . Self mutilating behavior   . Urgency of urination   . Wears glasses     Past Surgical History:  Procedure Laterality Date  . ABDOMINAL HYSTERECTOMY    . BALLOON DILATION N/A 10/01/2014   Procedure: BALLOON DILATION;  Surgeon: Garlan Fair, MD;  Location: [facility];  Service: Endoscopy;  Laterality: N/A;  . CYSTO/  BILATERAL RETROGRADE PYELOGRAM  11/20/2000  . CYSTO/  RIGHT RETROGRADE PYELOGRAM/  RIGHT URETEROSCOPY/  STENT PLACEMENT  12/16/2006  . CYSTOSCOPY/URETEROSCOPY/HOLMIUM LASER/STENT PLACEMENT Left 11/09/2016   Procedure: CYSTOSCOPY/URETEROSCOPY/HOLMIUM LASER/STENT PLACEMENT;  Surgeon: Nickie Retort, MD;  Location: [facility];  Service: Urology;  Laterality: Left;  90 MINS  [phone]   . ESOPHAGEAL MANOMETRY N/A 12/23/2014   Procedure: ESOPHAGEAL MANOMETRY (EM);  Surgeon: Garlan Fair, MD;  Location: [facility];  Service: Endoscopy;  Laterality: N/A;  . ESOPHAGOGASTRODUODENOSCOPY  last one 03-28-2015  . ESOPHAGOGASTRODUODENOSCOPY (EGD) WITH PROPOFOL N/A 10/01/2014   Procedure: ESOPHAGOGASTRODUODENOSCOPY (EGD) WITH PROPOFOL;  Surgeon: Garlan Fair, MD;   Location: [facility];  Service: Endoscopy;  Laterality: N/A;  . EXTRACORPOREAL SHOCK WAVE LITHOTRIPSY  multiple times since age 48  . HOLMIUM LASER APPLICATION Left [id number]   Procedure: HOLMIUM LASER APPLICATION;  Surgeon: Nickie Retort, MD;  Location: [facility];  Service: Urology;  Laterality: Left;  . KNEE ARTHROSCOPY Right 03-04-2014  [facility]   w/ Arthrotomy  . LAPAROSCOPIC ASSISTED VAGINAL HYSTERECTOMY  12/28/2005  . LAPAROSCOPIC CHOLECYSTECTOMY  01/17/2004  . LAPAROSCOPY LEFT OVARIAN CYSTECTOMY/  BILATERAL TUBAL LIGATION  02/26/2003  . LEFT URETEROSCOPIC STONE EXTRACTION /  STENT PLACEMENT  07/09/2002  . NECK EXPLORATION/  BILATERAL INFERIOR PARATHYROIDECTOMY  08/03/2010  . RIGHT URETERAL DILATION/  URETEROSCOPIC STONE EXTRACTION  06/12/2010  . ROUX-EN-Y GASTRIC BYPASS  2001  . SOLYX TRANSURETHRAL SLING/  POSTERIOR PELVIC FLOOR SACROSPINOUS REPAIR  02/21/2009   and Cysto/  Bilateral ureteral stent placement  . TONSILLECTOMY AND ADENOIDECTOMY    . WRIST GANGLION EXCISION Right 01/28/2000    Family History  Problem Relation Age of Onset  . Bipolar disorder Son   . Anxiety disorder Brother   . Depression Brother    Social History:  reports that she has been smoking Cigarettes.  She has a 5.00 pack-year smoking history. She has never used smokeless tobacco. She reports that she does not drink alcohol or use drugs.  Allergies:  Allergies  Allergen Reactions  . Lamictal [Lamotrigine] Nausea And Vomiting and Other (See Comments)    Tremors, diplopia  . Penicillins Hives and Itching    As a child, "breathing, itching problem" Has patient had a PCN reaction causing  immediate rash, facial/tongue/throat swelling, SOB or lightheadedness with hypotension: Yes Has patient had a PCN reaction causing severe rash involving mucus membranes or skin necrosis: Yes Has patient had a PCN reaction that required hospitalization No Has patient had a PCN reaction occurring  within the last 10 years: No If all of the above answers are "NO", then may proceed with Cephalosporin use.   . Sulfa Antibiotics Hives  . Morphine And Related Itching and Rash     (Not in a hospital admission)  No results found for this or any previous visit (from the past 48 hour(s)). No results found.  Review of Systems  Constitutional: Negative.   HENT: Negative.   Eyes: Negative.   Respiratory: Negative.   Cardiovascular: Negative.   Gastrointestinal: Negative.   Musculoskeletal: Negative.   Skin: Negative.   Neurological: Negative.   Psychiatric/Behavioral: Positive for memory loss. Negative for depression, hallucinations, substance abuse and suicidal ideas. The patient is nervous/anxious and has insomnia.     Blood pressure 125/83, pulse 75, temperature 98.4 F (36.9 C), temperature source Oral, resp. rate 16, height 5\' 7"  (1.702 m), weight 100.2 kg (221 lb), SpO2 98 %. Physical Exam  Nursing note and vitals reviewed. Constitutional: She appears well-developed and well-nourished.  HENT:  Head: Normocephalic and atraumatic.  Eyes: Conjunctivae are normal. Pupils are equal, round, and reactive to light.  Neck: Normal range of motion.  Cardiovascular: Regular rhythm and normal heart sounds.   Respiratory: Effort normal. No respiratory distress.  GI: Soft.  Musculoskeletal: Normal range of motion.  Neurological: She is alert.  Skin: Skin is warm and dry.  Psychiatric: Judgment normal. Her mood appears anxious. Her speech is delayed. She is slowed. She expresses no suicidal ideation. She exhibits abnormal recent memory.     Assessment/Plan Reassured her about memory impairment. No change to current treatment plan continue 3 times a week through this week.  [name], MD 12/27/2016, 10:14 AM

## 2016-12-27 NOTE — Discharge Instructions (Signed)
1)  The drugs that you have been given will stay in your system until tomorrow so for the       next 24 hours you should not:  A. Drive an automobile  B. Make any legal decisions  C. Drink any alcoholic beverages  2)  You may resume your regular meals upon return home.  3)  A responsible adult must take you home.  Someone should stay with you for a few          hours, then be available by phone for the remainder of the treatment day.  4)  You May experience any of the following symptoms:  Headache, Nausea and a dry mouth (due to the medications you were given),  temporary memory loss and some confusion, or sore muscles (a warm bath  should help this).  If you you experience any of these symptoms let us know on                your return visit.  5)  Report any of the following: any acute discomfort, severe headache, or temperature        greater than 100.5 F.   Also report any unusual redness, swelling, drainage, or pain         at your IV site.    You may report Symptoms to:  [place] at [facility]          Phone: [phone], ECT Department           or Dr. Prescott Gum office [phone]  6)  Your next ECT Treatment is Day Wednesday Date December 29 2016 815am  We will call 2 days prior to your scheduled appointment for arrival times.  7)  Nothing to eat or drink after midnight the night before your procedure.  8)  Take .   With a sip of water the morning of your procedure.  9)  Other Instructions: Call [phone] to cancel the morning of your procedure due         to illness or emergency.  10) We will call within 72 hours to assess how you are feeling.

## 2016-12-27 NOTE — Anesthesia Preprocedure Evaluation (Signed)
Anesthesia Evaluation  Patient identified by MRN, date of birth, ID band Patient awake    Reviewed: Allergy & Precautions, H&P , NPO status , Patient's Chart, lab work & pertinent test results, reviewed documented beta blocker date and time   History of Anesthesia Complications (+) PONV and history of anesthetic complications  Airway Mallampati: II  TM Distance: >3 FB Neck ROM: full    Dental  (+) Poor Dentition, Chipped   Pulmonary neg shortness of breath, asthma , neg sleep apnea, neg COPD, neg recent URI, Current Smoker,           Cardiovascular Exercise Tolerance: Good + Peripheral Vascular Disease  negative cardio ROS       Neuro/Psych  Headaches, neg Seizures PSYCHIATRIC DISORDERS (Bipolar and anxiety) Anxiety Bipolar Disorder  Neuromuscular disease (fibromyalgia)    GI/Hepatic Neg liver ROS, hiatal hernia, GERD  ,  Endo/Other  negative endocrine ROS  Renal/GU Renal disease (kidney stones)  negative genitourinary   Musculoskeletal  (+) Arthritis , Fibromyalgia -  Abdominal   Peds  Hematology  (+) Blood dyscrasia, anemia ,   Anesthesia Other Findings Past Medical History: No date: Asthma No date: Bipolar 1 disorder (HCC) No date: Fibromyalgia No date: GAD (generalized anxiety disorder) No date: GERD (gastroesophageal reflux disease) No date: Hiatal hernia No date: History of kidney stones No date: History of primary hyperparathyroidism     Comment: s/p  bilateral inferior parathyroidectomy               08-03-2010 No date: IBS (irritable bowel syndrome) No date: Left ureteral stone No date: Migraine No date: PONV (postoperative nausea and vomiting)     Comment: and claustrophobic with mask No date: RA (rheumatoid arthritis) (HCC)     Comment: dr Gavin Pound-- rheumtologist No date: Self mutilating behavior No date: Urgency of urination No date: Wears glasses   Reproductive/Obstetrics negative  OB ROS                             Anesthesia Physical  Anesthesia Plan  ASA: III  Anesthesia Plan: General   Post-op Pain Management:    Induction: Intravenous  Airway Management Planned: Mask  Additional Equipment:   Intra-op Plan:   Post-operative Plan:   Informed Consent: I have reviewed the patients History and Physical, chart, labs and discussed the procedure including the risks, benefits and alternatives for the proposed anesthesia with the patient or authorized representative who has indicated his/her understanding and acceptance.   Dental Advisory Given  Plan Discussed with: Anesthesiologist, CRNA and Surgeon  Anesthesia Plan Comments:         Anesthesia Quick Evaluation

## 2016-12-29 ENCOUNTER — Encounter (HOSPITAL_BASED_OUTPATIENT_CLINIC_OR_DEPARTMENT_OTHER)
Admission: RE | Admit: 2016-12-29 | Discharge: 2016-12-29 | Disposition: A | Discharge status: Home / Self Care | Payer: 59 | Source: Ambulatory Visit | Attending: Psychiatry | Admitting: Psychiatry

## 2016-12-29 ENCOUNTER — Encounter: Payer: Self-pay | Admitting: Anesthesiology

## 2016-12-29 ENCOUNTER — Other Ambulatory Visit: Payer: Self-pay | Admitting: Psychiatry

## 2016-12-29 DIAGNOSIS — F332 Major depressive disorder, recurrent severe without psychotic features: Secondary | ICD-10-CM

## 2016-12-29 MED ORDER — MIDAZOLAM HCL 2 MG/2ML IJ SOLN
INTRAMUSCULAR | Status: AC
  Filled 2016-12-29: qty 2

## 2016-12-29 MED ORDER — ONDANSETRON HCL 4 MG/2ML IJ SOLN
INTRAMUSCULAR | Status: AC
  Administered 2016-12-29: 4 mg via INTRAVENOUS
  Filled 2016-12-29: qty 2

## 2016-12-29 MED ORDER — SUCCINYLCHOLINE CHLORIDE 200 MG/10ML IV SOSY
PREFILLED_SYRINGE | INTRAVENOUS | Status: DC | PRN
  Administered 2016-12-29: 110 mg via INTRAVENOUS

## 2016-12-29 MED ORDER — METHOHEXITAL SODIUM 0.5 G IJ SOLR
INTRAMUSCULAR | Status: AC
  Filled 2016-12-29: qty 500

## 2016-12-29 MED ORDER — SODIUM CHLORIDE 0.9 % IV SOLN
INTRAVENOUS | Status: DC | PRN
  Administered 2016-12-29: 10:00:00 via INTRAVENOUS

## 2016-12-29 MED ORDER — KETOROLAC TROMETHAMINE 30 MG/ML IJ SOLN
INTRAMUSCULAR | Status: AC
  Administered 2016-12-29: 30 mg via INTRAVENOUS
  Filled 2016-12-29: qty 1

## 2016-12-29 MED ORDER — PROMETHAZINE HCL 25 MG/ML IJ SOLN: 6.2500 mg | INTRAMUSCULAR | Status: DC | PRN

## 2016-12-29 MED ORDER — MIDAZOLAM HCL 2 MG/2ML IJ SOLN
INTRAMUSCULAR | Status: DC | PRN
  Administered 2016-12-29: 2 mg via INTRAVENOUS

## 2016-12-29 MED ORDER — SODIUM CHLORIDE 0.9 % IV SOLN
500.0000 mL | Freq: Once | INTRAVENOUS | Status: AC
  Administered 2016-12-29: 1000 mL via INTRAVENOUS

## 2016-12-29 MED ORDER — METHOHEXITAL SODIUM 100 MG/10ML IV SOSY
PREFILLED_SYRINGE | INTRAVENOUS | Status: DC | PRN
  Administered 2016-12-29: 100 mg via INTRAVENOUS

## 2016-12-29 MED ORDER — SUCCINYLCHOLINE CHLORIDE 20 MG/ML IJ SOLN
INTRAMUSCULAR | Status: AC
  Filled 2016-12-29: qty 1

## 2016-12-29 MED ORDER — KETOROLAC TROMETHAMINE 30 MG/ML IJ SOLN
30.0000 mg | Freq: Once | INTRAMUSCULAR | Status: AC
  Administered 2016-12-29: 30 mg via INTRAVENOUS

## 2016-12-29 MED ORDER — ONDANSETRON HCL 4 MG/2ML IJ SOLN
4.0000 mg | Freq: Once | INTRAMUSCULAR | Status: AC
  Administered 2016-12-29: 4 mg via INTRAVENOUS

## 2016-12-29 NOTE — Anesthesia Postprocedure Evaluation (Signed)
Anesthesia Post Note  Patient: HASEL HRUZA  Procedure(s) Performed: * No procedures listed *  Patient location during evaluation: PACU Anesthesia Type: General Level of consciousness: awake and alert Pain management: pain level controlled Vital Signs Assessment: post-procedure vital signs reviewed and stable Respiratory status: spontaneous breathing, nonlabored ventilation, respiratory function stable and patient connected to nasal cannula oxygen Cardiovascular status: blood pressure returned to baseline and stable Postop Assessment: no signs of nausea or vomiting Anesthetic complications: no     Last Vitals:  Vitals:   12/29/16 1055 12/29/16 1102  BP: (!) 144/79 114/69  Pulse: 83 75  Resp: 12 16  Temp: 36.4 C 36.4 C    Last Pain:  Vitals:   12/29/16 1102  TempSrc: Oral  PainSc:                  Martha Clan

## 2016-12-29 NOTE — Procedures (Signed)
ECT SERVICES Physician's Interval Evaluation & Treatment Note  Patient Identification: Jordan Russell MRN:  [id number] Date of Evaluation:  12/29/2016 TX #: [id number]  MADRS: 23  MMSE: 28  P.E. Findings:  No change to physical exam. Lungs and heart normal.  Psychiatric Interval Note:  Mood is subjectively reported as a little bit better. Having some memory impairment. Not having suicidal thoughts.  Subjective:  Patient is a 46 y.o. female seen for evaluation for Electroconvulsive Therapy. Just the memory  Treatment Summary:   [x]   Right Unilateral             []  Bilateral   % Energy : 0.3 ms 50%   Impedance: 1730 ohms  Seizure Energy Index: 32,098 V squared  Postictal Suppression Index: 96%  Seizure Concordance Index: 99%  Medications  Pre Shock: Zofran 4 mg Toradol 30 mg Brevital 100 mg succinylcholine 110 mg  Post Shock: none  Seizure Duration: 23 seconds by EMG 50 seconds by EEG   Comments: Follow-up Friday   Lungs:  [x]   Clear to auscultation               []  Other:   Heart:    [x]   Regular rhythm             []  irregular rhythm    [x]   Previous H&P reviewed, patient examined and there are NO CHANGES                 []   Previous H&P reviewed, patient examined and there are changes noted.   [name], MD 2/28/201810:09 AM

## 2016-12-29 NOTE — Anesthesia Preprocedure Evaluation (Signed)
Anesthesia Evaluation  Patient identified by MRN, date of birth, ID band Patient awake    Reviewed: Allergy & Precautions, H&P , NPO status , Patient's Chart, lab work & pertinent test results, reviewed documented beta blocker date and time   History of Anesthesia Complications (+) PONV and history of anesthetic complications  Airway Mallampati: II  TM Distance: >3 FB Neck ROM: full    Dental  (+) Poor Dentition, Chipped   Pulmonary neg shortness of breath, asthma , neg sleep apnea, neg COPD, neg recent URI, Current Smoker,           Cardiovascular Exercise Tolerance: Good + Peripheral Vascular Disease  negative cardio ROS       Neuro/Psych  Headaches, neg Seizures PSYCHIATRIC DISORDERS (Bipolar and anxiety) Anxiety Bipolar Disorder  Neuromuscular disease (fibromyalgia)    GI/Hepatic Neg liver ROS, hiatal hernia, GERD  ,  Endo/Other  negative endocrine ROS  Renal/GU Renal disease (kidney stones)  negative genitourinary   Musculoskeletal  (+) Arthritis , Fibromyalgia -  Abdominal   Peds  Hematology  (+) Blood dyscrasia, anemia ,   Anesthesia Other Findings Past Medical History: No date: Asthma No date: Bipolar 1 disorder (HCC) No date: Fibromyalgia No date: GAD (generalized anxiety disorder) No date: GERD (gastroesophageal reflux disease) No date: Hiatal hernia No date: History of kidney stones No date: History of primary hyperparathyroidism     Comment: s/p  bilateral inferior parathyroidectomy               08-03-2010 No date: IBS (irritable bowel syndrome) No date: Left ureteral stone No date: Migraine No date: PONV (postoperative nausea and vomiting)     Comment: and claustrophobic with mask No date: RA (rheumatoid arthritis) (HCC)     Comment: dr Gavin Pound-- rheumtologist No date: Self mutilating behavior No date: Urgency of urination No date: Wears glasses   Reproductive/Obstetrics negative  OB ROS                             Anesthesia Physical  Anesthesia Plan  ASA: III  Anesthesia Plan: General   Post-op Pain Management:    Induction: Intravenous  Airway Management Planned: Mask  Additional Equipment:   Intra-op Plan:   Post-operative Plan:   Informed Consent: I have reviewed the patients History and Physical, chart, labs and discussed the procedure including the risks, benefits and alternatives for the proposed anesthesia with the patient or authorized representative who has indicated his/her understanding and acceptance.   Dental Advisory Given  Plan Discussed with: Anesthesiologist, CRNA and Surgeon  Anesthesia Plan Comments:         Anesthesia Quick Evaluation

## 2016-12-29 NOTE — Transfer of Care (Signed)
Immediate Anesthesia Transfer of Care Note  Patient: Jordan Russell  Procedure(s) Performed: ECT  Patient Location: PACU  Anesthesia Type:General  Level of Consciousness: sedated  Airway & Oxygen Therapy: Patient Spontanous Breathing  Post-op Assessment: Report given to RN and Post -op Vital signs reviewed and stable  Post vital signs: Reviewed and stable  Last Vitals:  Vitals:   12/29/16 0846 12/29/16 1027  BP: 123/81 123/69  Pulse: 77 78  Resp: 18 15  Temp: 36.6 C 36.5 C    Last Pain:  Vitals:   12/29/16 0846  TempSrc: Oral         Complications: No apparent anesthesia complications

## 2016-12-29 NOTE — Progress Notes (Signed)
Patient is now stating that she is nauseous. The patient was given an emesis bag and offered Ginger Ale. The patient stated she did not want the Ginger Ale. Education provided to the patient that slight nauseous is expected. She stated that the feeling she had was more of a "car sick" feeling. The patient was also educated regarding not taking Xanax the night before her procedure. The husband was present during the education and both verbalized understanding of the information.

## 2016-12-29 NOTE — H&P (Signed)
Jordan Russell is an 46 y.o. female.   Chief Complaint: Mood a little bit better. Energy level perhaps slightly better. Had nausea after last treatment. Still having noticeable memory loss. HPI: History of recurrent severe depression currently in the midst of an index ECT course  Past Medical History:  Diagnosis Date  . Asthma   . Bipolar 1 disorder ([place])   . Fibromyalgia   . GAD (generalized anxiety disorder)   . GERD (gastroesophageal reflux disease)   . Hiatal hernia   . History of kidney stones   . History of primary hyperparathyroidism    s/p  bilateral inferior parathyroidectomy 08-03-2010  . IBS (irritable bowel syndrome)   . Left ureteral stone   . Migraine   . PONV (postoperative nausea and vomiting)    and claustrophobic with mask  . RA (rheumatoid arthritis) ([place])    dr Gavin Pound-- rheumtologist  . Self mutilating behavior   . Urgency of urination   . Wears glasses     Past Surgical History:  Procedure Laterality Date  . ABDOMINAL HYSTERECTOMY    . BALLOON DILATION N/A 10/01/2014   Procedure: BALLOON DILATION;  Surgeon: Garlan Fair, MD;  Location: [facility];  Service: Endoscopy;  Laterality: N/A;  . CYSTO/  BILATERAL RETROGRADE PYELOGRAM  11/20/2000  . CYSTO/  RIGHT RETROGRADE PYELOGRAM/  RIGHT URETEROSCOPY/  STENT PLACEMENT  12/16/2006  . CYSTOSCOPY/URETEROSCOPY/HOLMIUM LASER/STENT PLACEMENT Left 11/09/2016   Procedure: CYSTOSCOPY/URETEROSCOPY/HOLMIUM LASER/STENT PLACEMENT;  Surgeon: Nickie Retort, MD;  Location: [facility];  Service: Urology;  Laterality: Left;  90 MINS  [phone]   . ESOPHAGEAL MANOMETRY N/A 12/23/2014   Procedure: ESOPHAGEAL MANOMETRY (EM);  Surgeon: Garlan Fair, MD;  Location: [facility];  Service: Endoscopy;  Laterality: N/A;  . ESOPHAGOGASTRODUODENOSCOPY  last one 03-28-2015  . ESOPHAGOGASTRODUODENOSCOPY (EGD) WITH PROPOFOL N/A 10/01/2014   Procedure: ESOPHAGOGASTRODUODENOSCOPY (EGD) WITH  PROPOFOL;  Surgeon: Garlan Fair, MD;  Location: [facility];  Service: Endoscopy;  Laterality: N/A;  . EXTRACORPOREAL SHOCK WAVE LITHOTRIPSY  multiple times since age 86  . HOLMIUM LASER APPLICATION Left [id number]   Procedure: HOLMIUM LASER APPLICATION;  Surgeon: Nickie Retort, MD;  Location: [facility];  Service: Urology;  Laterality: Left;  . KNEE ARTHROSCOPY Right 03-04-2014  [facility]   w/ Arthrotomy  . LAPAROSCOPIC ASSISTED VAGINAL HYSTERECTOMY  12/28/2005  . LAPAROSCOPIC CHOLECYSTECTOMY  01/17/2004  . LAPAROSCOPY LEFT OVARIAN CYSTECTOMY/  BILATERAL TUBAL LIGATION  02/26/2003  . LEFT URETEROSCOPIC STONE EXTRACTION /  STENT PLACEMENT  07/09/2002  . NECK EXPLORATION/  BILATERAL INFERIOR PARATHYROIDECTOMY  08/03/2010  . RIGHT URETERAL DILATION/  URETEROSCOPIC STONE EXTRACTION  06/12/2010  . ROUX-EN-Y GASTRIC BYPASS  2001  . SOLYX TRANSURETHRAL SLING/  POSTERIOR PELVIC FLOOR SACROSPINOUS REPAIR  02/21/2009   and Cysto/  Bilateral ureteral stent placement  . TONSILLECTOMY AND ADENOIDECTOMY    . WRIST GANGLION EXCISION Right 01/28/2000    Family History  Problem Relation Age of Onset  . Bipolar disorder Son   . Anxiety disorder Brother   . Depression Brother    Social History:  reports that she has been smoking Cigarettes.  She has a 5.00 pack-year smoking history. She has never used smokeless tobacco. She reports that she does not drink alcohol or use drugs.  Allergies:  Allergies  Allergen Reactions  . Lamictal [Lamotrigine] Nausea And Vomiting and Other (See Comments)    Tremors, diplopia  . Penicillins Hives and Itching    As a child, "breathing,  itching problem" Has patient had a PCN reaction causing immediate rash, facial/tongue/throat swelling, SOB or lightheadedness with hypotension: Yes Has patient had a PCN reaction causing severe rash involving mucus membranes or skin necrosis: Yes Has patient had a PCN reaction that required hospitalization  No Has patient had a PCN reaction occurring within the last 10 years: No If all of the above answers are "NO", then may proceed with Cephalosporin use.   . Sulfa Antibiotics Hives  . Morphine And Related Itching and Rash     (Not in a hospital admission)  No results found for this or any previous visit (from the past 48 hour(s)). No results found.  Review of Systems  Constitutional: Negative.   HENT: Negative.   Eyes: Negative.   Respiratory: Negative.   Cardiovascular: Negative.   Gastrointestinal: Negative.   Musculoskeletal: Negative.   Skin: Negative.   Neurological: Negative.   Psychiatric/Behavioral: Positive for depression and memory loss. Negative for hallucinations, substance abuse and suicidal ideas. The patient is nervous/anxious. The patient does not have insomnia.     Blood pressure 123/81, pulse 77, temperature 97.9 F (36.6 C), temperature source Oral, resp. rate 18, height 5\' 7"  (1.702 m), weight 99.3 kg (219 lb), SpO2 100 %. Physical Exam  Nursing note and vitals reviewed. Constitutional: She appears well-developed and well-nourished.  HENT:  Head: Normocephalic and atraumatic.  Eyes: Conjunctivae are normal. Pupils are equal, round, and reactive to light.  Neck: Normal range of motion.  Cardiovascular: Regular rhythm and normal heart sounds.   Respiratory: Effort normal. No respiratory distress.  GI: Soft.  Musculoskeletal: Normal range of motion.  Neurological: She is alert.  Skin: Skin is warm and dry.  Psychiatric: She has a normal mood and affect. Her behavior is normal. Judgment and thought content normal.     Assessment/Plan Follow-up on Friday and continue into next week after treatment today. Pretreatment with Zofran today to try to avoid nausea  [name], MD 12/29/2016, 10:07 AM

## 2016-12-29 NOTE — Anesthesia Post-op Follow-up Note (Cosign Needed)
Anesthesia QCDR form completed.        

## 2016-12-29 NOTE — Discharge Instructions (Signed)
1)  The drugs that you have been given will stay in your system until tomorrow so for the       next 24 hours you should not:  A. Drive an automobile  B. Make any legal decisions  C. Drink any alcoholic beverages  2)  You may resume your regular meals upon return home.  3)  A responsible adult must take you home.  Someone should stay with you for a few          hours, then be available by phone for the remainder of the treatment day.  4)  You May experience any of the following symptoms:  Headache, Nausea and a dry mouth (due to the medications you were given),  temporary memory loss and some confusion, or sore muscles (a warm bath  should help this).  If you you experience any of these symptoms let us know on                your return visit.  5)  Report any of the following: any acute discomfort, severe headache, or temperature        greater than 100.5 F.   Also report any unusual redness, swelling, drainage, or pain         at your IV site.    You may report Symptoms to:  [place] at [facility]          Phone: [phone], ECT Department           or Dr. Prescott Gum office [phone]  6)  Your next ECT Treatment is Friday December 31, 2016   We will call 2 days prior to your scheduled appointment for arrival times.  7)  Nothing to eat or drink after midnight the night before your procedure.  8)  Take Wellbutrin and Flomax    With a sip of water the morning of your procedure.  9)  Other Instructions: Call [phone] to cancel the morning of your procedure due         to illness or emergency.  10) We will call within 72 hours to assess how you are feeling.

## 2016-12-29 NOTE — Anesthesia Procedure Notes (Signed)
Date/Time: 12/29/2016 10:16 AM Performed by: Dionne Bucy Pre-anesthesia Checklist: Patient identified, Emergency Drugs available, Suction available and Patient being monitored Patient Re-evaluated:Patient Re-evaluated prior to inductionOxygen Delivery Method: Circle system utilized Preoxygenation: Pre-oxygenation with 100% oxygen Intubation Type: IV induction Ventilation: Mask ventilation without difficulty and Mask ventilation throughout procedure Airway Equipment and Method: Bite block Placement Confirmation: positive ETCO2 Dental Injury: Teeth and Oropharynx as per pre-operative assessment

## 2016-12-31 ENCOUNTER — Encounter: Payer: Self-pay | Admitting: Anesthesiology

## 2016-12-31 ENCOUNTER — Other Ambulatory Visit: Payer: Self-pay | Admitting: Psychiatry

## 2016-12-31 ENCOUNTER — Encounter
Admission: RE | Admit: 2016-12-31 | Discharge: 2016-12-31 | Disposition: A | Discharge status: Home / Self Care | Payer: 59 | Source: Ambulatory Visit | Attending: Psychiatry | Admitting: Psychiatry

## 2016-12-31 DIAGNOSIS — Z885 Allergy status to narcotic agent status: Secondary | ICD-10-CM | POA: Insufficient documentation

## 2016-12-31 DIAGNOSIS — Z880 Allergy status to penicillin: Secondary | ICD-10-CM | POA: Insufficient documentation

## 2016-12-31 DIAGNOSIS — K589 Irritable bowel syndrome without diarrhea: Secondary | ICD-10-CM | POA: Insufficient documentation

## 2016-12-31 DIAGNOSIS — F17210 Nicotine dependence, cigarettes, uncomplicated: Secondary | ICD-10-CM | POA: Insufficient documentation

## 2016-12-31 DIAGNOSIS — Z888 Allergy status to other drugs, medicaments and biological substances status: Secondary | ICD-10-CM | POA: Insufficient documentation

## 2016-12-31 DIAGNOSIS — Z882 Allergy status to sulfonamides status: Secondary | ICD-10-CM | POA: Insufficient documentation

## 2016-12-31 DIAGNOSIS — K219 Gastro-esophageal reflux disease without esophagitis: Secondary | ICD-10-CM | POA: Insufficient documentation

## 2016-12-31 DIAGNOSIS — M797 Fibromyalgia: Secondary | ICD-10-CM | POA: Diagnosis not present

## 2016-12-31 DIAGNOSIS — R413 Other amnesia: Secondary | ICD-10-CM | POA: Diagnosis not present

## 2016-12-31 DIAGNOSIS — M069 Rheumatoid arthritis, unspecified: Secondary | ICD-10-CM | POA: Insufficient documentation

## 2016-12-31 DIAGNOSIS — J45909 Unspecified asthma, uncomplicated: Secondary | ICD-10-CM | POA: Diagnosis not present

## 2016-12-31 DIAGNOSIS — Z818 Family history of other mental and behavioral disorders: Secondary | ICD-10-CM | POA: Diagnosis not present

## 2016-12-31 DIAGNOSIS — F411 Generalized anxiety disorder: Secondary | ICD-10-CM | POA: Insufficient documentation

## 2016-12-31 DIAGNOSIS — F332 Major depressive disorder, recurrent severe without psychotic features: Secondary | ICD-10-CM | POA: Diagnosis not present

## 2016-12-31 DIAGNOSIS — Z87442 Personal history of urinary calculi: Secondary | ICD-10-CM | POA: Insufficient documentation

## 2016-12-31 MED ORDER — ONDANSETRON HCL 4 MG/2ML IJ SOLN
4.0000 mg | Freq: Once | INTRAMUSCULAR | Status: AC
  Administered 2016-12-31: 4 mg via INTRAVENOUS

## 2016-12-31 MED ORDER — SUCCINYLCHOLINE CHLORIDE 200 MG/10ML IV SOSY
PREFILLED_SYRINGE | INTRAVENOUS | Status: DC | PRN
  Administered 2016-12-31: 110 mg via INTRAVENOUS

## 2016-12-31 MED ORDER — KETOROLAC TROMETHAMINE 30 MG/ML IJ SOLN
30.0000 mg | Freq: Once | INTRAMUSCULAR | Status: AC
Start: 2016-12-31 — End: 2016-12-31
  Administered 2016-12-31: 30 mg via INTRAVENOUS

## 2016-12-31 MED ORDER — ONDANSETRON HCL 4 MG/2ML IJ SOLN: 4.0000 mg | Freq: Once | INTRAMUSCULAR | Status: DC | PRN

## 2016-12-31 MED ORDER — KETOROLAC TROMETHAMINE 30 MG/ML IJ SOLN
INTRAMUSCULAR | Status: AC
  Administered 2016-12-31: 30 mg via INTRAVENOUS
  Filled 2016-12-31: qty 1

## 2016-12-31 MED ORDER — LABETALOL HCL 5 MG/ML IV SOLN
INTRAVENOUS | Status: AC
  Filled 2016-12-31: qty 4

## 2016-12-31 MED ORDER — MIDAZOLAM HCL 2 MG/2ML IJ SOLN
INTRAMUSCULAR | Status: DC | PRN
  Administered 2016-12-31: 2 mg via INTRAVENOUS

## 2016-12-31 MED ORDER — SODIUM CHLORIDE 0.9 % IV SOLN
500.0000 mL | Freq: Once | INTRAVENOUS | Status: AC
  Administered 2016-12-31: 10:00:00 via INTRAVENOUS

## 2016-12-31 MED ORDER — SODIUM CHLORIDE 0.9 % IV SOLN
INTRAVENOUS | Status: DC | PRN
  Administered 2016-12-31: 10:00:00 via INTRAVENOUS

## 2016-12-31 MED ORDER — FENTANYL CITRATE (PF) 100 MCG/2ML IJ SOLN: 25.0000 ug | INTRAMUSCULAR | Status: DC | PRN

## 2016-12-31 MED ORDER — ONDANSETRON HCL 4 MG/2ML IJ SOLN
INTRAMUSCULAR | Status: AC
  Administered 2016-12-31: 4 mg via INTRAVENOUS
  Filled 2016-12-31: qty 2

## 2016-12-31 MED ORDER — MIDAZOLAM HCL 2 MG/2ML IJ SOLN
INTRAMUSCULAR | Status: AC
  Filled 2016-12-31: qty 2

## 2016-12-31 MED ORDER — MIDAZOLAM HCL 2 MG/2ML IJ SOLN: 2.0000 mg | Freq: Once | INTRAMUSCULAR | Status: DC

## 2016-12-31 MED ORDER — SUCCINYLCHOLINE CHLORIDE 20 MG/ML IJ SOLN
INTRAMUSCULAR | Status: AC
  Filled 2016-12-31: qty 1

## 2016-12-31 MED ORDER — METHOHEXITAL SODIUM 100 MG/10ML IV SOSY
PREFILLED_SYRINGE | INTRAVENOUS | Status: DC | PRN
  Administered 2016-12-31: 100 mg via INTRAVENOUS

## 2016-12-31 NOTE — Transfer of Care (Signed)
Immediate Anesthesia Transfer of Care Note  Patient: Jordan Russell  Procedure(s) Performed: ECT  Patient Location: PACU  Anesthesia Type:General  Level of Consciousness: sedated  Airway & Oxygen Therapy: Patient Spontanous Breathing  Post-op Assessment: Report given to RN and Post -op Vital signs reviewed and stable  Post vital signs: Reviewed and stable  Last Vitals:  Vitals:   12/31/16 0913 12/31/16 1047  BP: 107/69 133/71  Pulse: (!) 59 89  Resp:  11  Temp: 36.8 C 36.9 C    Last Pain:  Vitals:   12/31/16 1047  TempSrc:   PainSc: 0-No pain         Complications: No apparent anesthesia complications

## 2016-12-31 NOTE — H&P (Signed)
Jordan Russell is an 46 y.o. female.   Chief Complaint: Mood continues to feel better memory still noticeable. She does notice that she's had some change to her sense of taste during the time of getting the treatment HPI: Patient was severe recurrent depression currently getting index course and in the middle of improving  Past Medical History:  Diagnosis Date  . Asthma   . Bipolar 1 disorder ([place])   . Fibromyalgia   . GAD (generalized anxiety disorder)   . GERD (gastroesophageal reflux disease)   . Hiatal hernia   . History of kidney stones   . History of primary hyperparathyroidism    s/p  bilateral inferior parathyroidectomy 08-03-2010  . IBS (irritable bowel syndrome)   . Left ureteral stone   . Migraine   . PONV (postoperative nausea and vomiting)    and claustrophobic with mask  . RA (rheumatoid arthritis) ([place])    dr Gavin Pound-- rheumtologist  . Self mutilating behavior   . Urgency of urination   . Wears glasses     Past Surgical History:  Procedure Laterality Date  . ABDOMINAL HYSTERECTOMY    . BALLOON DILATION N/A 10/01/2014   Procedure: BALLOON DILATION;  Surgeon: Garlan Fair, MD;  Location: [facility];  Service: Endoscopy;  Laterality: N/A;  . CYSTO/  BILATERAL RETROGRADE PYELOGRAM  11/20/2000  . CYSTO/  RIGHT RETROGRADE PYELOGRAM/  RIGHT URETEROSCOPY/  STENT PLACEMENT  12/16/2006  . CYSTOSCOPY/URETEROSCOPY/HOLMIUM LASER/STENT PLACEMENT Left 11/09/2016   Procedure: CYSTOSCOPY/URETEROSCOPY/HOLMIUM LASER/STENT PLACEMENT;  Surgeon: Nickie Retort, MD;  [facility];  Service: Urology;  Laterality: Left;  90 MINS  [phone]   . ESOPHAGEAL MANOMETRY N/A 12/23/2014   Procedure: ESOPHAGEAL MANOMETRY (EM);  Surgeon: Garlan Fair, MD;  Location: [facility];  Service: Endoscopy;  Laterality: N/A;  . ESOPHAGOGASTRODUODENOSCOPY  last one 03-28-2015  . ESOPHAGOGASTRODUODENOSCOPY (EGD) WITH PROPOFOL N/A 10/01/2014   Procedure:  ESOPHAGOGASTRODUODENOSCOPY (EGD) WITH PROPOFOL;  Surgeon: Garlan Fair, MD;  Location: [facility];  Service: Endoscopy;  Laterality: N/A;  . EXTRACORPOREAL SHOCK WAVE LITHOTRIPSY  multiple times since age 31  . HOLMIUM LASER APPLICATION Left [id number]   Procedure: HOLMIUM LASER APPLICATION;  Surgeon: Nickie Retort, MD;  Location: [facility];  Service: Urology;  Laterality: Left;  . KNEE ARTHROSCOPY Right 03-04-2014  [facility]   w/ Arthrotomy  . LAPAROSCOPIC ASSISTED VAGINAL HYSTERECTOMY  12/28/2005  . LAPAROSCOPIC CHOLECYSTECTOMY  01/17/2004  . LAPAROSCOPY LEFT OVARIAN CYSTECTOMY/  BILATERAL TUBAL LIGATION  02/26/2003  . LEFT URETEROSCOPIC STONE EXTRACTION /  STENT PLACEMENT  07/09/2002  . NECK EXPLORATION/  BILATERAL INFERIOR PARATHYROIDECTOMY  08/03/2010  . RIGHT URETERAL DILATION/  URETEROSCOPIC STONE EXTRACTION  06/12/2010  . ROUX-EN-Y GASTRIC BYPASS  2001  . SOLYX TRANSURETHRAL SLING/  POSTERIOR PELVIC FLOOR SACROSPINOUS REPAIR  02/21/2009   and Cysto/  Bilateral ureteral stent placement  . TONSILLECTOMY AND ADENOIDECTOMY    . WRIST GANGLION EXCISION Right 01/28/2000    Family History  Problem Relation Age of Onset  . Bipolar disorder Son   . Anxiety disorder Brother   . Depression Brother    Social History:  reports that she has been smoking Cigarettes.  She has a 5.00 pack-year smoking history. She has never used smokeless tobacco. She reports that she does not drink alcohol or use drugs.  Allergies:  Allergies  Allergen Reactions  . Lamictal [Lamotrigine] Nausea And Vomiting and Other (See Comments)    Tremors, diplopia  . Penicillins Hives  and Itching    As a child, "breathing, itching problem" Has patient had a PCN reaction causing immediate rash, facial/tongue/throat swelling, SOB or lightheadedness with hypotension: Yes Has patient had a PCN reaction causing severe rash involving mucus membranes or skin necrosis: Yes Has patient had a PCN  reaction that required hospitalization No Has patient had a PCN reaction occurring within the last 10 years: No If all of the above answers are "NO", then may proceed with Cephalosporin use.   . Sulfa Antibiotics Hives  . Morphine And Related Itching and Rash     (Not in a hospital admission)  No results found for this or any previous visit (from the past 48 hour(s)). No results found.  Review of Systems  Constitutional: Negative.   HENT: Negative.   Eyes: Negative.   Respiratory: Negative.   Cardiovascular: Negative.   Gastrointestinal: Negative.   Musculoskeletal: Negative.   Skin: Negative.   Neurological: Negative.   Psychiatric/Behavioral: Positive for memory loss. Negative for depression, hallucinations, substance abuse and suicidal ideas. The patient is not nervous/anxious and does not have insomnia.     Blood pressure 107/69, pulse (!) 59, temperature 98.2 F (36.8 C), temperature source Oral, height 5\' 7"  (1.702 m), weight 100.7 kg (222 lb), SpO2 98 %. Physical Exam  Nursing note and vitals reviewed. Constitutional: She appears well-developed and well-nourished.  HENT:  Head: Normocephalic and atraumatic.  Eyes: Conjunctivae are normal. Pupils are equal, round, and reactive to light.  Neck: Normal range of motion.  Cardiovascular: Regular rhythm and normal heart sounds.   Respiratory: Effort normal. No respiratory distress.  GI: Soft.  Musculoskeletal: Normal range of motion.  Neurological: She is alert.  Skin: Skin is warm and dry.  Psychiatric: She has a normal mood and affect. Her behavior is normal. Judgment and thought content normal.     Assessment/Plan Continue into next week after treatment today with a treatment by treatment reassessment for when we get to a time.to stop  [name], MD 12/31/2016, 10:29 AM

## 2016-12-31 NOTE — Anesthesia Postprocedure Evaluation (Signed)
Anesthesia Post Note  Patient: Jordan Russell  Procedure(s) Performed: * No procedures listed *  Patient location during evaluation: PACU Anesthesia Type: General Level of consciousness: awake and alert Pain management: pain level controlled Vital Signs Assessment: post-procedure vital signs reviewed and stable Respiratory status: spontaneous breathing, nonlabored ventilation, respiratory function stable and patient connected to nasal cannula oxygen Cardiovascular status: blood pressure returned to baseline and stable Postop Assessment: no signs of nausea or vomiting Anesthetic complications: no     Last Vitals:  Vitals:   12/31/16 1107 12/31/16 1118  BP: 126/62   Pulse: 76 84  Resp: 15 16  Temp:  36.9 C    Last Pain:  Vitals:   12/31/16 1118  TempSrc: Oral  PainSc:                  Lai Hendriks S

## 2016-12-31 NOTE — Procedures (Signed)
ECT SERVICES Physician's Interval Evaluation & Treatment Note  Patient Identification: Jordan Russell MRN:  [id number] Date of Evaluation:  12/31/2016 TX #: 5  MADRS:   MMSE:   P.E. Findings:  No change to physical exam  Psychiatric Interval Note:  Mood continues to be good with some improvement complains of short-term memory loss does not show any signs of delirium.  Subjective:  Patient is a 46 y.o. female seen for evaluation for Electroconvulsive Therapy. Generally feeling improved as noted above  Treatment Summary:   [x]   Right Unilateral             []  Bilateral   % Energy : 0.3 ms 50%   Impedance: 1770 ohms  Seizure Energy Index: 12,714 V squared  Postictal Suppression Index: 45%  Seizure Concordance Index: 97%  Medications  Pre Shock: Zofran 4 mg Toradol 30 mg Brevital 100 mg succinylcholine 110 mg  Post Shock: Versed 2 mg  Seizure Duration: About 6 seconds by EMG 23 seconds EEG   Comments: Much shorter and lower amplitude seizure. Plan increase energy to 75% on Monday and continue into next week.   Lungs:  [x]   Clear to auscultation               []  Other:   Heart:    [x]   Regular rhythm             []  irregular rhythm    [x]   Previous H&P reviewed, patient examined and there are NO CHANGES                 []   Previous H&P reviewed, patient examined and there are changes noted.   [name], MD 3/2/201810:31 AM

## 2016-12-31 NOTE — Anesthesia Post-op Follow-up Note (Cosign Needed)
Anesthesia QCDR form completed.        

## 2016-12-31 NOTE — Anesthesia Preprocedure Evaluation (Signed)
Anesthesia Evaluation  Patient identified by MRN, date of birth, ID band Patient awake    Reviewed: Allergy & Precautions, NPO status , Patient's Chart, lab work & pertinent test results, reviewed documented beta blocker date and time   History of Anesthesia Complications (+) PONV and history of anesthetic complications  Airway Mallampati: III  TM Distance: >3 FB     Dental  (+) Chipped   Pulmonary asthma , Current Smoker,           Cardiovascular + Peripheral Vascular Disease       Neuro/Psych  Headaches, PSYCHIATRIC DISORDERS Anxiety Bipolar Disorder  Neuromuscular disease    GI/Hepatic hiatal hernia, GERD  Controlled,  Endo/Other    Renal/GU Renal InsufficiencyRenal disease     Musculoskeletal  (+) Arthritis , Fibromyalgia -  Abdominal   Peds  Hematology  (+) anemia ,   Anesthesia Other Findings   Reproductive/Obstetrics                             Anesthesia Physical Anesthesia Plan  ASA: III  Anesthesia Plan: General   Post-op Pain Management:    Induction: Intravenous  Airway Management Planned:   Additional Equipment:   Intra-op Plan:   Post-operative Plan:   Informed Consent: I have reviewed the patients History and Physical, chart, labs and discussed the procedure including the risks, benefits and alternatives for the proposed anesthesia with the patient or authorized representative who has indicated his/her understanding and acceptance.     Plan Discussed with: CRNA  Anesthesia Plan Comments:         Anesthesia Quick Evaluation

## 2016-12-31 NOTE — Discharge Instructions (Signed)
1)  The drugs that you have been given will stay in your system until tomorrow so for the       next 24 hours you should not:  A. Drive an automobile  B. Make any legal decisions  C. Drink any alcoholic beverages  2)  You may resume your regular meals upon return home.  3)  A responsible adult must take you home.  Someone should stay with you for a few          hours, then be available by phone for the remainder of the treatment day.  4)  You May experience any of the following symptoms:  Headache, Nausea and a dry mouth (due to the medications you were given),  temporary memory loss and some confusion, or sore muscles (a warm bath  should help this).  If you you experience any of these symptoms let us know on                your return visit.  5)  Report any of the following: any acute discomfort, severe headache, or temperature        greater than 100.5 F.   Also report any unusual redness, swelling, drainage, or pain         at your IV site.    You may report Symptoms to:  [place] [facility]          Phone: [phone], ECT Department           or Dr. Prescott Gum office [phone]  6)  Your next ECT Treatment is Monday January 03, 2017 We will call 2 days prior to your scheduled appointment for arrival times.  7)  Nothing to eat or drink after midnight the night before your procedure.  8)  Take Prilosec, Wellbutrin and Flomax    With a sip of water the morning of your procedure.  9)  Other Instructions: Call [phone] to cancel the morning of your procedure due         to illness or emergency.  10) We will call within 72 hours to assess how you are feeling.

## 2017-01-03 ENCOUNTER — Encounter: Payer: Self-pay | Admitting: Certified Registered Nurse Anesthetist

## 2017-01-03 ENCOUNTER — Other Ambulatory Visit: Payer: Self-pay | Admitting: Psychiatry

## 2017-01-03 ENCOUNTER — Telehealth: Payer: Self-pay

## 2017-01-03 ENCOUNTER — Encounter (HOSPITAL_BASED_OUTPATIENT_CLINIC_OR_DEPARTMENT_OTHER)
Admission: RE | Admit: 2017-01-03 | Discharge: 2017-01-03 | Disposition: A | Discharge status: Home / Self Care | Payer: 59 | Source: Ambulatory Visit | Attending: Psychiatry | Admitting: Psychiatry

## 2017-01-03 DIAGNOSIS — F332 Major depressive disorder, recurrent severe without psychotic features: Secondary | ICD-10-CM | POA: Diagnosis not present

## 2017-01-03 MED ORDER — KETOROLAC TROMETHAMINE 30 MG/ML IJ SOLN
30.0000 mg | Freq: Once | INTRAMUSCULAR | Status: AC
  Administered 2017-01-03: 30 mg via INTRAVENOUS

## 2017-01-03 MED ORDER — SODIUM CHLORIDE 0.9 % IV SOLN
500.0000 mL | Freq: Once | INTRAVENOUS | Status: AC
Start: 2017-01-03 — End: 2017-01-03
  Administered 2017-01-03: 09:00:00 via INTRAVENOUS

## 2017-01-03 MED ORDER — KETOROLAC TROMETHAMINE 30 MG/ML IJ SOLN
INTRAMUSCULAR | Status: AC
  Administered 2017-01-03: 30 mg via INTRAVENOUS
  Filled 2017-01-03: qty 1

## 2017-01-03 MED ORDER — SUCCINYLCHOLINE CHLORIDE 20 MG/ML IJ SOLN
INTRAMUSCULAR | Status: DC | PRN
  Administered 2017-01-03: 110 mg via INTRAVENOUS

## 2017-01-03 MED ORDER — ONDANSETRON HCL 4 MG/2ML IJ SOLN
4.0000 mg | Freq: Once | INTRAMUSCULAR | Status: AC
  Administered 2017-01-03: 4 mg via INTRAVENOUS

## 2017-01-03 MED ORDER — FENTANYL CITRATE (PF) 100 MCG/2ML IJ SOLN: 25.0000 ug | INTRAMUSCULAR | Status: DC | PRN

## 2017-01-03 MED ORDER — MIDAZOLAM HCL 2 MG/2ML IJ SOLN
INTRAMUSCULAR | Status: DC | PRN
  Administered 2017-01-03: 2 mg via INTRAVENOUS

## 2017-01-03 MED ORDER — ONDANSETRON HCL 4 MG/2ML IJ SOLN
INTRAMUSCULAR | Status: AC
Start: 2017-01-03 — End: 2017-01-03
  Administered 2017-01-03: 4 mg via INTRAVENOUS
  Filled 2017-01-03: qty 2

## 2017-01-03 MED ORDER — SODIUM CHLORIDE 0.9 % IV SOLN
INTRAVENOUS | Status: DC | PRN
  Administered 2017-01-03: 10:00:00 via INTRAVENOUS

## 2017-01-03 MED ORDER — MIDAZOLAM HCL 2 MG/2ML IJ SOLN
INTRAMUSCULAR | Status: AC
  Filled 2017-01-03: qty 2

## 2017-01-03 MED ORDER — SUCCINYLCHOLINE CHLORIDE 20 MG/ML IJ SOLN
INTRAMUSCULAR | Status: AC
  Filled 2017-01-03: qty 1

## 2017-01-03 MED ORDER — ONDANSETRON HCL 4 MG/2ML IJ SOLN: 4.0000 mg | Freq: Once | INTRAMUSCULAR | Status: DC | PRN

## 2017-01-03 MED ORDER — METHOHEXITAL SODIUM 100 MG/10ML IV SOSY
PREFILLED_SYRINGE | INTRAVENOUS | Status: DC | PRN
  Administered 2017-01-03: 100 mg via INTRAVENOUS

## 2017-01-03 NOTE — Telephone Encounter (Signed)
Pt pharmacy sent a refill request of flomax for 90 days. I saw your surgery note that stated pt should come in for stent removal in office. Pt has not come for that visit. Do you want to refill flomax?

## 2017-01-03 NOTE — Anesthesia Procedure Notes (Signed)
Date/Time: 01/03/2017 10:06 AM Performed by: Johnna Acosta Pre-anesthesia Checklist: Patient identified, Emergency Drugs available, Suction available, Patient being monitored and Timeout performed Patient Re-evaluated:Patient Re-evaluated prior to inductionOxygen Delivery Method: Circle system utilized Preoxygenation: Pre-oxygenation with 100% oxygen Intubation Type: IV induction Ventilation: Mask ventilation without difficulty

## 2017-01-03 NOTE — Procedures (Signed)
ECT SERVICES Physician's Interval Evaluation & Treatment Note  Patient Identification: Jordan Russell MRN:  [id number] Date of Evaluation:  01/03/2017 TX #: 6  MADRS:   MMSE:   P.E. Findings:  No change to physical exam. Scars on her arms continue to heal up well without any sign of new injury  Psychiatric Interval Note:  Mood is stated as continued to improve. Subjectively noticing confusion and memory impairment  Subjective:  Patient is a 46 y.o. female seen for evaluation for Electroconvulsive Therapy. No specific complaints other than the memory  Treatment Summary:   [x]   Right Unilateral             []  Bilateral   % Energy : 0.3 ms 75%   Impedance: 1720 ohms  Seizure Energy Index: 34,454 V squared  Postictal Suppression Index: 93%  Seizure Concordance Index: 97%  Medications  Pre Shock: Zofran 4 mg Toradol 30 mg Brevital 100 mg succinylcholine 110 mg  Post Shock: Versed 2 mg  Seizure Duration: 24 seconds EMG, 50 seconds EEG   Comments: Follow-up Wednesday at which point we will reassess and possibly switch to maintenance treatment   Lungs:  [x]   Clear to auscultation               []  Other:   Heart:    [x]   Regular rhythm             []  irregular rhythm    [x]   Previous H&P reviewed, patient examined and there are NO CHANGES                 []   Previous H&P reviewed, patient examined and there are changes noted.   [name], MD 3/5/201810:06 AM

## 2017-01-03 NOTE — Anesthesia Post-op Follow-up Note (Cosign Needed)
Anesthesia QCDR form completed.        

## 2017-01-03 NOTE — Discharge Instructions (Signed)
1)  The drugs that you have been given will stay in your system until tomorrow so for the       next 24 hours you should not:  A. Drive an automobile  B. Make any legal decisions  C. Drink any alcoholic beverages  2)  You may resume your regular meals upon return home.  3)  A responsible adult must take you home.  Someone should stay with you for a few          hours, then be available by phone for the remainder of the treatment day.  4)  You May experience any of the following symptoms:  Headache, Nausea and a dry mouth (due to the medications you were given),  temporary memory loss and some confusion, or sore muscles (a warm bath  should help this).  If you you experience any of these symptoms let us know on                your return visit.  5)  Report any of the following: any acute discomfort, severe headache, or temperature        greater than 100.5 F.   Also report any unusual redness, swelling, drainage, or pain         at your IV site.    You may report Symptoms to:  [place] at [facility]          Phone: [phone], ECT Department           or Dr. Prescott Gum office [phone]  6)  Your next ECT Treatment is Wednesday March 7 at 8:00  We will call 2 days prior to your scheduled appointment for arrival times.  7)  Nothing to eat or drink after midnight the night before your procedure.  8)  Take Wellbutrin, Prilosec and , Flomax      With a sip of water the morning of your procedure.  9)  Other Instructions: Call [phone] to cancel the morning of your procedure due         to illness or emergency.  10) We will call within 72 hours to assess how you are feeling.

## 2017-01-03 NOTE — H&P (Signed)
Jordan Russell is an 46 y.o. female.   Chief Complaint: Patient is having significant memory impairment HPI: Patient with major depression recurrent severe has shown significant improvement with ECT  Past Medical History:  Diagnosis Date  . Asthma   . Bipolar 1 disorder ([place])   . Fibromyalgia   . GAD (generalized anxiety disorder)   . GERD (gastroesophageal reflux disease)   . Hiatal hernia   . History of kidney stones   . History of primary hyperparathyroidism    s/p  bilateral inferior parathyroidectomy 08-03-2010  . IBS (irritable bowel syndrome)   . Left ureteral stone   . Migraine   . PONV (postoperative nausea and vomiting)    and claustrophobic with mask  . RA (rheumatoid arthritis) ([place])    dr Gavin Pound-- rheumtologist  . Self mutilating behavior   . Urgency of urination   . Wears glasses     Past Surgical History:  Procedure Laterality Date  . ABDOMINAL HYSTERECTOMY    . BALLOON DILATION N/A 10/01/2014   Procedure: BALLOON DILATION;  Surgeon: Garlan Fair, MD;  Location: [facility];  Service: Endoscopy;  Laterality: N/A;  . CYSTO/  BILATERAL RETROGRADE PYELOGRAM  11/20/2000  . CYSTO/  RIGHT RETROGRADE PYELOGRAM/  RIGHT URETEROSCOPY/  STENT PLACEMENT  12/16/2006  . CYSTOSCOPY/URETEROSCOPY/HOLMIUM LASER/STENT PLACEMENT Left 11/09/2016   Procedure: CYSTOSCOPY/URETEROSCOPY/HOLMIUM LASER/STENT PLACEMENT;  Surgeon: Nickie Retort, MD;  Location: [facility];  Service: Urology;  Laterality: Left;  90 MINS  [phone]   . ESOPHAGEAL MANOMETRY N/A 12/23/2014   Procedure: ESOPHAGEAL MANOMETRY (EM);  Surgeon: Garlan Fair, MD;  Location: [facility];  Service: Endoscopy;  Laterality: N/A;  . ESOPHAGOGASTRODUODENOSCOPY  last one 03-28-2015  . ESOPHAGOGASTRODUODENOSCOPY (EGD) WITH PROPOFOL N/A 10/01/2014   Procedure: ESOPHAGOGASTRODUODENOSCOPY (EGD) WITH PROPOFOL;  Surgeon: Garlan Fair, MD;  Location: [facility];  Service: Endoscopy;   Laterality: N/A;  . EXTRACORPOREAL SHOCK WAVE LITHOTRIPSY  multiple times since age 52  . HOLMIUM LASER APPLICATION Left [id number]   Procedure: HOLMIUM LASER APPLICATION;  Surgeon: Nickie Retort, MD;  Location: [facility];  Service: Urology;  Laterality: Left;  . KNEE ARTHROSCOPY Right 03-04-2014  [facility]   w/ Arthrotomy  . LAPAROSCOPIC ASSISTED VAGINAL HYSTERECTOMY  12/28/2005  . LAPAROSCOPIC CHOLECYSTECTOMY  01/17/2004  . LAPAROSCOPY LEFT OVARIAN CYSTECTOMY/  BILATERAL TUBAL LIGATION  02/26/2003  . LEFT URETEROSCOPIC STONE EXTRACTION /  STENT PLACEMENT  07/09/2002  . NECK EXPLORATION/  BILATERAL INFERIOR PARATHYROIDECTOMY  08/03/2010  . RIGHT URETERAL DILATION/  URETEROSCOPIC STONE EXTRACTION  06/12/2010  . ROUX-EN-Y GASTRIC BYPASS  2001  . SOLYX TRANSURETHRAL SLING/  POSTERIOR PELVIC FLOOR SACROSPINOUS REPAIR  02/21/2009   and Cysto/  Bilateral ureteral stent placement  . TONSILLECTOMY AND ADENOIDECTOMY    . WRIST GANGLION EXCISION Right 01/28/2000    Family History  Problem Relation Age of Onset  . Bipolar disorder Son   . Anxiety disorder Brother   . Depression Brother    Social History:  reports that she has been smoking Cigarettes.  She has a 5.00 pack-year smoking history. She has never used smokeless tobacco. She reports that she does not drink alcohol or use drugs.  Allergies:  Allergies  Allergen Reactions  . Lamictal [Lamotrigine] Nausea And Vomiting and Other (See Comments)    Tremors, diplopia  . Penicillins Hives and Itching    As a child, "breathing, itching problem" Has patient had a PCN reaction causing immediate rash, facial/tongue/throat swelling, SOB or lightheadedness  with hypotension: Yes Has patient had a PCN reaction causing severe rash involving mucus membranes or skin necrosis: Yes Has patient had a PCN reaction that required hospitalization No Has patient had a PCN reaction occurring within the last 10 years: No If all of the above  answers are "NO", then may proceed with Cephalosporin use.   . Sulfa Antibiotics Hives  . Morphine And Related Itching and Rash     (Not in a hospital admission)  No results found for this or any previous visit (from the past 48 hour(s)). No results found.  Review of Systems  Constitutional: Negative.   HENT: Negative.   Eyes: Negative.   Respiratory: Negative.   Cardiovascular: Negative.   Gastrointestinal: Negative.   Musculoskeletal: Negative.   Skin: Negative.   Neurological: Negative.   Psychiatric/Behavioral: Positive for memory loss. Negative for depression, hallucinations, substance abuse and suicidal ideas. The patient is not nervous/anxious and does not have insomnia.     Blood pressure 117/75, pulse 90, temperature 98.5 F (36.9 C), temperature source Oral, resp. rate 16, height 5\' 7"  (1.702 m), weight 101.6 kg (224 lb), SpO2 100 %. Physical Exam  Nursing note and vitals reviewed. Constitutional: She appears well-developed and well-nourished.  HENT:  Head: Normocephalic and atraumatic.  Eyes: Conjunctivae are normal. Pupils are equal, round, and reactive to light.  Neck: Normal range of motion.  Cardiovascular: Regular rhythm and normal heart sounds.   Respiratory: Effort normal. No respiratory distress.  GI: Soft.  Musculoskeletal: Normal range of motion.  Neurological: She is alert.  Skin: Skin is warm and dry.  Psychiatric: She has a normal mood and affect. Her behavior is normal. Judgment and thought content normal.     Assessment/Plan Treatment today followed by further assessment on Wednesday which point we may be looking into switching to maintenance treatment  [name], MD 01/03/2017, 10:04 AM

## 2017-01-03 NOTE — Anesthesia Preprocedure Evaluation (Signed)
Anesthesia Evaluation  Patient identified by MRN, date of birth, ID band Patient awake    Reviewed: Allergy & Precautions, H&P , NPO status , Patient's Chart, lab work & pertinent test results, reviewed documented beta blocker date and time   History of Anesthesia Complications (+) PONV and history of anesthetic complications  Airway Mallampati: II   Neck ROM: full    Dental  (+) Poor Dentition   Pulmonary neg pulmonary ROS, asthma , Current Smoker,    Pulmonary exam normal        Cardiovascular negative cardio ROS Normal cardiovascular exam Rhythm:regular Rate:Normal     Neuro/Psych  Headaches, PSYCHIATRIC DISORDERS negative neurological ROS  negative psych ROS   GI/Hepatic negative GI ROS, Neg liver ROS, hiatal hernia, GERD  ,  Endo/Other  negative endocrine ROS  Renal/GU Renal diseasenegative Renal ROS  negative genitourinary   Musculoskeletal   Abdominal   Peds  Hematology negative hematology ROS (+) anemia ,   Anesthesia Other Findings Past Medical History: No date: Asthma No date: Bipolar 1 disorder ([facility]) No date: Fibromyalgia No date: GAD (generalized anxiety disorder) No date: GERD (gastroesophageal reflux disease) No date: Hiatal hernia No date: History of kidney stones No date: History of primary hyperparathyroidism     Comment: s/p  bilateral inferior parathyroidectomy               08-03-2010 No date: IBS (irritable bowel syndrome) No date: Left ureteral stone No date: Migraine No date: PONV (postoperative nausea and vomiting)     Comment: and claustrophobic with mask No date: RA (rheumatoid arthritis) ([place])     Comment: dr Gavin Pound-- rheumtologist No date: Self mutilating behavior No date: Urgency of urination No date: Wears glasses Past Surgical History: No date: ABDOMINAL HYSTERECTOMY 10/01/2014: BALLOON DILATION N/A     Comment: Procedure: BALLOON DILATION;  Surgeon: Garlan Fair, MD;  Location: [facility];                Service: Endoscopy;  Laterality: N/A; 11/20/2000: CYSTO/  BILATERAL RETROGRADE PYELOGRAM 12/16/2006: CYSTO/  RIGHT RETROGRADE PYELOGRAM/  RIGHT URE* 11/09/2016: CYSTOSCOPY/URETEROSCOPY/HOLMIUM LASER/STENT PL* Left     Comment: Procedure: CYSTOSCOPY/URETEROSCOPY/HOLMIUM               LASER/STENT PLACEMENT;  Surgeon: Nickie Retort, MD;  Location: [facility];  Service: Urology;  Laterality: Left;                90 MINS  [phone]  12/23/2014: ESOPHAGEAL MANOMETRY N/A     Comment: Procedure: ESOPHAGEAL MANOMETRY (EM);                Surgeon: Garlan Fair, MD;  [facility]: [facility];  Service: Endoscopy;  Laterality:               N/A; last one 03-28-2015: ESOPHAGOGASTRODUODENOSCOPY 10/01/2014: ESOPHAGOGASTRODUODENOSCOPY (EGD) WITH PROPOFOL N/A     Comment: Procedure: ESOPHAGOGASTRODUODENOSCOPY (EGD)               WITH PROPOFOL;  Surgeon: Garlan Fair, MD;               Location: [facility];  Service: Endoscopy;  Laterality: N/A; multiple times since age 24: [place] LITHOTRIPSY 11/09/2016: HOLMIUM LASER APPLICATION Left     Comment: Procedure: HOLMIUM LASER APPLICATION;                Surgeon: Nickie Retort, MD;  Location:               [facility];  Service: Urology;               Laterality: Left; 03-04-2014  [facility]: KNEE ARTHROSCOPY Right     Comment: w/ Arthrotomy 12/28/2005: LAPAROSCOPIC ASSISTED VAGINAL HYSTERECTOMY 01/17/2004: LAPAROSCOPIC CHOLECYSTECTOMY 02/26/2003: LAPAROSCOPY LEFT OVARIAN CYSTECTOMY/  BILATERA* 07/09/2002: LEFT URETEROSCOPIC STONE EXTRACTION /  STENT P* 08/03/2010: NECK EXPLORATION/  BILATERAL INFERIOR PARATHYR* 06/12/2010: RIGHT URETERAL DILATION/  URETEROSCOPIC STONE * 2001: ROUX-EN-Y GASTRIC BYPASS 02/21/2009: SOLYX TRANSURETHRAL SLING/  POSTERIOR PELVIC F*     Comment:  and Cysto/  Bilateral ureteral stent placement No date: TONSILLECTOMY AND ADENOIDECTOMY 01/28/2000: WRIST GANGLION EXCISION Right BMI    Body Mass Index:  35.08 kg/m     Reproductive/Obstetrics negative OB ROS                             Anesthesia Physical Anesthesia Plan  ASA: III  Anesthesia Plan: General   Post-op Pain Management:    Induction:   Airway Management Planned:   Additional Equipment:   Intra-op Plan:   Post-operative Plan:   Informed Consent: I have reviewed the patients History and Physical, chart, labs and discussed the procedure including the risks, benefits and alternatives for the proposed anesthesia with the patient or authorized representative who has indicated his/her understanding and acceptance.   Dental Advisory Given  Plan Discussed with: CRNA  Anesthesia Plan Comments:         Anesthesia Quick Evaluation

## 2017-01-03 NOTE — Transfer of Care (Signed)
Immediate Anesthesia Transfer of Care Note  Patient: Jordan Russell  Procedure(s) Performed: * No procedures listed *  Patient Location: PACU  Anesthesia Type:General  Level of Consciousness: sedated  Airway & Oxygen Therapy: Patient Spontanous Breathing and Patient connected to face mask oxygen  Post-op Assessment: Report given to RN and Post -op Vital signs reviewed and stable  Post vital signs: Reviewed and stable  Last Vitals:  Vitals:   01/03/17 0833  BP: 117/75  Pulse: 90  Resp: 16  Temp: 36.9 C    Last Pain:  Vitals:   01/03/17 0833  TempSrc: Oral         Complications: No apparent anesthesia complications

## 2017-01-04 ENCOUNTER — Telehealth: Payer: Self-pay

## 2017-01-04 NOTE — Anesthesia Postprocedure Evaluation (Signed)
Anesthesia Post Note  Patient: Jordan Russell  Procedure(s) Performed: * No procedures listed *  Patient location during evaluation: PACU Anesthesia Type: General Level of consciousness: awake and alert Pain management: pain level controlled Vital Signs Assessment: post-procedure vital signs reviewed and stable Respiratory status: spontaneous breathing, nonlabored ventilation, respiratory function stable and patient connected to nasal cannula oxygen Cardiovascular status: blood pressure returned to baseline and stable Postop Assessment: no signs of nausea or vomiting Anesthetic complications: no     Last Vitals:  Vitals:   01/03/17 1117 01/03/17 1137  BP: 110/65 124/79  Pulse:  90  Resp: 15 18  Temp:  36.6 C    Last Pain:  Vitals:   01/03/17 1137  TempSrc: Oral  PainSc:                  [name]

## 2017-01-04 NOTE — Telephone Encounter (Signed)
this person called had questions about ect and some issues she was having.  pt was given the phone # to [facility] .

## 2017-01-05 ENCOUNTER — Other Ambulatory Visit: Payer: Self-pay | Admitting: Psychiatry

## 2017-01-05 ENCOUNTER — Ambulatory Visit
Admission: RE | Admit: 2017-01-05 | Discharge: 2017-01-05 | Disposition: A | Discharge status: Home / Self Care | Payer: 59 | Source: Ambulatory Visit | Attending: Psychiatry | Admitting: Psychiatry

## 2017-01-05 ENCOUNTER — Encounter: Payer: Self-pay | Admitting: Anesthesiology

## 2017-01-05 NOTE — Progress Notes (Signed)
Patient called the ECT office Tuesday and stated she was having complications from the ECT. She arrived to her appointment today and was more specific stating,"Monday after she had her procedure she went home and had a God awful time remembering the smallest detail of her life". She continued that Tuesday she was having an episode of nausea and diarrhea. She stated when she tried to get to the bathroom she found that she had loss control of her bowels and bladder. She added that she felt as though she had run a 500 mile marathon her muscles were so sore. This nurse called Dr. Weber Cooks to report what the patient reported to me. He stated that he wanted to cancel the appointment for today and call her this afternoon to consult with her regarding furture ECT appointments.

## 2017-01-05 NOTE — Telephone Encounter (Signed)
Spoke with pt in reference to having stent placed and needing to have it removed. Pt stated that she went to [facility] to have stent removed. Pt also stated that she is not wanting a refill of flomax at this time.

## 2017-01-05 NOTE — Anesthesia Preprocedure Evaluation (Deleted)
Anesthesia Evaluation  Patient identified by MRN, date of birth, ID band Patient awake    Reviewed: Allergy & Precautions, NPO status , Patient's Chart, lab work & pertinent test results, reviewed documented beta blocker date and time   History of Anesthesia Complications (+) PONV  Airway Mallampati: II  TM Distance: >3 FB     Dental  (+) Chipped   Pulmonary asthma , Current Smoker,           Cardiovascular + Peripheral Vascular Disease       Neuro/Psych  Headaches, PSYCHIATRIC DISORDERS Anxiety Bipolar Disorder  Neuromuscular disease    GI/Hepatic hiatal hernia, GERD  Controlled,  Endo/Other    Renal/GU Renal disease     Musculoskeletal  (+) Arthritis , Fibromyalgia -  Abdominal   Peds  Hematology  (+) anemia ,   Anesthesia Other Findings   Reproductive/Obstetrics                             Anesthesia Physical Anesthesia Plan  ASA: III  Anesthesia Plan: General   Post-op Pain Management:    Induction: Intravenous  Airway Management Planned:   Additional Equipment:   Intra-op Plan:   Post-operative Plan:   Informed Consent: I have reviewed the patients History and Physical, chart, labs and discussed the procedure including the risks, benefits and alternatives for the proposed anesthesia with the patient or authorized representative who has indicated his/her understanding and acceptance.     Plan Discussed with: CRNA  Anesthesia Plan Comments:         Anesthesia Quick Evaluation

## 2017-01-06 NOTE — Telephone Encounter (Signed)
I spoke with her on the phone yesterday and we have settled the issues for the time being.

## 2017-01-07 NOTE — Progress Notes (Signed)
Returned patient call at 1530, 01-07-17,  regarding extreme exhaustion and muscle soreness.  Patient states that all she wants to do is sleep and I (nurse) told her that the exhaustion should probably not be due to her ECT treatment 5 days ago and that she needed to call her PCP and check with him.  I also told her to mention her muscle soreness that is ongoing.  Patient acknowledged understanding of situation.

## 2017-01-10 ENCOUNTER — Telehealth: Payer: Self-pay

## 2017-01-14 ENCOUNTER — Telehealth: Payer: Self-pay | Admitting: *Deleted

## 2017-01-17 ENCOUNTER — Other Ambulatory Visit: Payer: Self-pay | Admitting: Psychiatry

## 2017-01-17 ENCOUNTER — Encounter: Payer: Self-pay | Admitting: Registered Nurse

## 2017-01-17 ENCOUNTER — Encounter (HOSPITAL_BASED_OUTPATIENT_CLINIC_OR_DEPARTMENT_OTHER)
Admission: RE | Admit: 2017-01-17 | Discharge: 2017-01-17 | Disposition: A | Discharge status: Home / Self Care | Payer: 59 | Source: Ambulatory Visit | Attending: Psychiatry | Admitting: Psychiatry

## 2017-01-17 DIAGNOSIS — F332 Major depressive disorder, recurrent severe without psychotic features: Secondary | ICD-10-CM | POA: Diagnosis not present

## 2017-01-17 MED ORDER — ONDANSETRON HCL 4 MG/2ML IJ SOLN: 4.0000 mg | Freq: Once | INTRAMUSCULAR | Status: DC | PRN

## 2017-01-17 MED ORDER — SUCCINYLCHOLINE CHLORIDE 20 MG/ML IJ SOLN
INTRAMUSCULAR | Status: DC | PRN
  Administered 2017-01-17: 110 mg via INTRAVENOUS

## 2017-01-17 MED ORDER — MIDAZOLAM HCL 2 MG/2ML IJ SOLN
INTRAMUSCULAR | Status: AC
  Filled 2017-01-17: qty 2

## 2017-01-17 MED ORDER — FENTANYL CITRATE (PF) 100 MCG/2ML IJ SOLN: 25.0000 ug | INTRAMUSCULAR | Status: DC | PRN

## 2017-01-17 MED ORDER — METHOHEXITAL SODIUM 100 MG/10ML IV SOSY
PREFILLED_SYRINGE | INTRAVENOUS | Status: DC | PRN
  Administered 2017-01-17: 100 mg via INTRAVENOUS

## 2017-01-17 MED ORDER — SODIUM CHLORIDE 0.9 % IV SOLN
INTRAVENOUS | Status: DC | PRN
  Administered 2017-01-17: 10:00:00 via INTRAVENOUS

## 2017-01-17 MED ORDER — KETOROLAC TROMETHAMINE 30 MG/ML IJ SOLN
INTRAMUSCULAR | Status: AC
  Administered 2017-01-17: 30 mg via INTRAVENOUS
  Filled 2017-01-17: qty 1

## 2017-01-17 MED ORDER — MIDAZOLAM HCL 2 MG/2ML IJ SOLN
INTRAMUSCULAR | Status: DC | PRN
  Administered 2017-01-17: 2 mg via INTRAVENOUS

## 2017-01-17 MED ORDER — SODIUM CHLORIDE 0.9 % IV SOLN
500.0000 mL | Freq: Once | INTRAVENOUS | Status: AC
  Administered 2017-01-17: 1000 mL via INTRAVENOUS

## 2017-01-17 MED ORDER — ONDANSETRON HCL 4 MG/2ML IJ SOLN
INTRAMUSCULAR | Status: AC
  Administered 2017-01-17: 4 mg via INTRAVENOUS
  Filled 2017-01-17: qty 2

## 2017-01-17 MED ORDER — KETOROLAC TROMETHAMINE 30 MG/ML IJ SOLN
30.0000 mg | Freq: Once | INTRAMUSCULAR | Status: AC
  Administered 2017-01-17: 30 mg via INTRAVENOUS

## 2017-01-17 MED ORDER — ONDANSETRON HCL 4 MG/2ML IJ SOLN
4.0000 mg | Freq: Once | INTRAMUSCULAR | Status: AC
  Administered 2017-01-17: 4 mg via INTRAVENOUS

## 2017-01-17 MED ORDER — METHOHEXITAL SODIUM 0.5 G IJ SOLR
INTRAMUSCULAR | Status: AC
  Filled 2017-01-17: qty 500

## 2017-01-17 MED ORDER — SUCCINYLCHOLINE CHLORIDE 20 MG/ML IJ SOLN
INTRAMUSCULAR | Status: AC
  Filled 2017-01-17: qty 1

## 2017-01-17 NOTE — Anesthesia Post-op Follow-up Note (Cosign Needed)
Anesthesia QCDR form completed.        

## 2017-01-17 NOTE — H&P (Signed)
Jordan Russell is an 46 y.o. female.   Chief Complaint: Turn of anxiety and depression. No new physical complaints. No active suicidal intent or plan HPI: Recurrent severe depression and responded well to ECT returning for outpatient treatment.  Past Medical History:  Diagnosis Date  . Asthma   . Bipolar 1 disorder ([place])   . Fibromyalgia   . GAD (generalized anxiety disorder)   . GERD (gastroesophageal reflux disease)   . Hiatal hernia   . History of kidney stones   . History of primary hyperparathyroidism    s/p  bilateral inferior parathyroidectomy 08-03-2010  . IBS (irritable bowel syndrome)   . Left ureteral stone   . Migraine   . PONV (postoperative nausea and vomiting)    and claustrophobic with mask  . RA (rheumatoid arthritis) ([place])    dr Gavin Pound-- rheumtologist  . Self mutilating behavior   . Urgency of urination   . Wears glasses     Past Surgical History:  Procedure Laterality Date  . ABDOMINAL HYSTERECTOMY    . BALLOON DILATION N/A 10/01/2014   Procedure: BALLOON DILATION;  Surgeon: Garlan Fair, MD;  Location: [facility];  Service: Endoscopy;  Laterality: N/A;  . CYSTO/  BILATERAL RETROGRADE PYELOGRAM  11/20/2000  . CYSTO/  RIGHT RETROGRADE PYELOGRAM/  RIGHT URETEROSCOPY/  STENT PLACEMENT  12/16/2006  . CYSTOSCOPY/URETEROSCOPY/HOLMIUM LASER/STENT PLACEMENT Left 11/09/2016   Procedure: CYSTOSCOPY/URETEROSCOPY/HOLMIUM LASER/STENT PLACEMENT;  Surgeon: Nickie Retort, MD;  Location: [facility];  Service: Urology;  Laterality: Left;  90 MINS  [phone]   . ESOPHAGEAL MANOMETRY N/A 12/23/2014   Procedure: ESOPHAGEAL MANOMETRY (EM);  Surgeon: Garlan Fair, MD;  Location: [facility];  Service: Endoscopy;  Laterality: N/A;  . ESOPHAGOGASTRODUODENOSCOPY  last one 03-28-2015  . ESOPHAGOGASTRODUODENOSCOPY (EGD) WITH PROPOFOL N/A 10/01/2014   Procedure: ESOPHAGOGASTRODUODENOSCOPY (EGD) WITH PROPOFOL;  Surgeon: Garlan Fair, MD;   Location: [facility];  Service: Endoscopy;  Laterality: N/A;  . EXTRACORPOREAL SHOCK WAVE LITHOTRIPSY  multiple times since age 63  . HOLMIUM LASER APPLICATION Left 04/03/1307   Procedure: HOLMIUM LASER APPLICATION;  Surgeon: Nickie Retort, MD;  Location: [facility]  Service: Urology;  Laterality: Left;  . KNEE ARTHROSCOPY Right 03-04-2014  [facility]   w/ Arthrotomy  . LAPAROSCOPIC ASSISTED VAGINAL HYSTERECTOMY  12/28/2005  . LAPAROSCOPIC CHOLECYSTECTOMY  01/17/2004  . LAPAROSCOPY LEFT OVARIAN CYSTECTOMY/  BILATERAL TUBAL LIGATION  02/26/2003  . LEFT URETEROSCOPIC STONE EXTRACTION /  STENT PLACEMENT  07/09/2002  . NECK EXPLORATION/  BILATERAL INFERIOR PARATHYROIDECTOMY  08/03/2010  . RIGHT URETERAL DILATION/  URETEROSCOPIC STONE EXTRACTION  06/12/2010  . ROUX-EN-Y GASTRIC BYPASS  2001  . SOLYX TRANSURETHRAL SLING/  POSTERIOR PELVIC FLOOR SACROSPINOUS REPAIR  02/21/2009   and Cysto/  Bilateral ureteral stent placement  . TONSILLECTOMY AND ADENOIDECTOMY    . WRIST GANGLION EXCISION Right 01/28/2000    Family History  Problem Relation Age of Onset  . Bipolar disorder Son   . Anxiety disorder Brother   . Depression Brother    Social History:  reports that she has been smoking Cigarettes.  She has a 5.00 pack-year smoking history. She has never used smokeless tobacco. She reports that she does not drink alcohol or use drugs.  Allergies:  Allergies  Allergen Reactions  . Lamictal [Lamotrigine] Nausea And Vomiting and Other (See Comments)    Tremors, diplopia  . Penicillins Hives and Itching    As a child, "breathing, itching problem" Has patient had a PCN  reaction causing immediate rash, facial/tongue/throat swelling, SOB or lightheadedness with hypotension: Yes Has patient had a PCN reaction causing severe rash involving mucus membranes or skin necrosis: Yes Has patient had a PCN reaction that required hospitalization No Has patient had a PCN reaction occurring  within the last 10 years: No If all of the above answers are "NO", then may proceed with Cephalosporin use.   . Sulfa Antibiotics Hives  . Morphine And Related Itching and Rash     (Not in a hospital admission)  No results found for this or any previous visit (from the past 48 hour(s)). No results found.  Review of Systems  Constitutional: Negative.   HENT: Negative.   Eyes: Negative.   Respiratory: Negative.   Cardiovascular: Negative.   Gastrointestinal: Negative.   Musculoskeletal: Negative.   Skin: Negative.   Neurological: Negative.   Psychiatric/Behavioral: Positive for depression. Negative for hallucinations, memory loss, substance abuse and suicidal ideas. The patient is nervous/anxious. The patient does not have insomnia.     Blood pressure 122/75, pulse (!) 58, temperature 98 F (36.7 C), temperature source Oral, resp. rate 18, height 5\' 7"  (1.702 m), weight 102.1 kg (225 lb), SpO2 100 %. Physical Exam  Nursing note and vitals reviewed. Constitutional: She appears well-developed and well-nourished.  HENT:  Head: Normocephalic and atraumatic.  Eyes: Conjunctivae are normal. Pupils are equal, round, and reactive to light.  Neck: Normal range of motion.  Cardiovascular: Regular rhythm and normal heart sounds.   Respiratory: Effort normal. No respiratory distress.  GI: Soft.  Musculoskeletal: Normal range of motion.  Neurological: She is alert.  Skin: Skin is warm and dry.  Psychiatric: Her speech is normal and behavior is normal. Judgment and thought content normal. Her mood appears anxious. Cognition and memory are normal. She exhibits a depressed mood.     Assessment/Plan Treatment today and we have discussed a going forward plan and we will probably do treatment again on Wednesday and possibly Friday.  [name], MD 01/17/2017, 10:07 AM

## 2017-01-17 NOTE — Anesthesia Postprocedure Evaluation (Signed)
Anesthesia Post Note  Patient: Jordan Russell  Procedure(s) Performed: * No procedures listed *  Patient location during evaluation: PACU Anesthesia Type: General Level of consciousness: awake and alert Pain management: pain level controlled Vital Signs Assessment: post-procedure vital signs reviewed and stable Respiratory status: spontaneous breathing, nonlabored ventilation, respiratory function stable and patient connected to nasal cannula oxygen Cardiovascular status: blood pressure returned to baseline and stable Postop Assessment: no signs of nausea or vomiting Anesthetic complications: no     Last Vitals:  Vitals:   01/17/17 1056 01/17/17 1111  BP: 107/62 110/63  Pulse: 76 83  Resp: 15 16  Temp: 37.1 C     Last Pain:  Vitals:   01/17/17 0854  TempSrc: Oral  PainSc: 0-No pain                 Evadna Donaghy S

## 2017-01-17 NOTE — Transfer of Care (Signed)
Immediate Anesthesia Transfer of Care Note  Patient: Jordan Russell  Procedure(s) Performed: * No procedures listed *  Patient Location: PACU  Anesthesia Type:General  Level of Consciousness: sedated  Airway & Oxygen Therapy: Patient Spontanous Breathing and Patient connected to face mask oxygen  Post-op Assessment: Report given to RN and Post -op Vital signs reviewed and stable  Post vital signs: Reviewed and stable  Last Vitals:  Vitals:   01/17/17 0854 01/17/17 1026  BP: 122/75 117/72  Pulse: (!) 58 88  Resp: 18 (!) 21  Temp: 36.7 C 36.6 C    Last Pain:  Vitals:   01/17/17 0854  TempSrc: Oral  PainSc: 0-No pain         Complications: No apparent anesthesia complications

## 2017-01-17 NOTE — Discharge Instructions (Signed)
1)  The drugs that you have been given will stay in your system until tomorrow so for the       next 24 hours you should not: ° A. Drive an automobile ° B. Make any legal decisions ° C. Drink any alcoholic beverages ° °2)  You may resume your regular meals upon return home. ° °3)  A responsible adult must take you home.  Someone should stay with you for a few          hours, then be available by phone for the remainder of the treatment day. ° °4)  You May experience any of the following symptoms: ° Headache, Nausea and a dry mouth (due to the medications you were given),  temporary memory loss and some confusion, or sore muscles (a warm bath  should help this).  If you you experience any of these symptoms let us know on                your return visit. ° °5)  Report any of the following: any acute discomfort, severe headache, or temperature        greater than 100.5 F.   Also report any unusual redness, swelling, drainage, or pain         at your IV site. ° °  You may report Symptoms to:  [facility]- [place] [facility] °         Phone: [phone], ECT Department  °         or Dr. Clapac's office [phone] ° °6)  Your next ECT Treatment is Wednesday January 19, 2017  ° We will call 2 days prior to your scheduled appointment for arrival times. ° °7)  Nothing to eat or drink after midnight the night before your procedure. ° °8)  Take    With a sip of water the morning of your procedure. ° °9)  Other Instructions: Call [phone] to cancel the morning of your procedure due         to illness or emergency. ° °10) We will call within 72 hours to assess how you are feeling.  °

## 2017-01-17 NOTE — Procedures (Signed)
ECT SERVICES Physician's Interval Evaluation & Treatment Note  Patient Identification: Jordan Russell MRN:  [id number] Date of Evaluation:  01/17/2017 TX #: 7  MADRS: 25  MMSE: 27  P.E. Findings:  Stable vitals okay. Heart and lungs normal.  Psychiatric Interval Note:  Depressed and anxious not actively suicidal no psychotic symptoms.  Subjective:  Patient is a 46 y.o. female seen for evaluation for Electroconvulsive Therapy. Memory is probably doing pretty well but mood and anxiety are worse  Treatment Summary:   [x]   Right Unilateral             []  Bilateral   % Energy : 0.3 ms 75%   Impedance: 1990 ohms  Seizure Energy Index: 29,601 V squared  Postictal Suppression Index: 20% (I think it was actually probably higher)  Seizure Concordance Index: 98%  Medications  Pre Shock: Zofran 4 mg Toradol 30 mg Brevital 100 mg succinylcholine 110 mg  Post Shock: Versed 2 mg  Seizure Duration: 18 seconds by EMG 35 seconds by EEG   Comments: Follow-up on Wednesday   Lungs:  [x]   Clear to auscultation               []  Other:   Heart:    [x]   Regular rhythm             []  irregular rhythm    [x]   Previous H&P reviewed, patient examined and there are NO CHANGES                 []   Previous H&P reviewed, patient examined and there are changes noted.   [name], MD 3/19/201810:09 AM

## 2017-01-17 NOTE — Anesthesia Preprocedure Evaluation (Signed)
Anesthesia Evaluation  Patient identified by MRN, date of birth, ID band Patient awake    Reviewed: Allergy & Precautions, NPO status , Patient's Chart, lab work & pertinent test results, reviewed documented beta blocker date and time   History of Anesthesia Complications (+) PONV and history of anesthetic complications  Airway Mallampati: III  TM Distance: >3 FB     Dental  (+) Chipped   Pulmonary asthma , Current Smoker,           Cardiovascular + Peripheral Vascular Disease       Neuro/Psych  Headaches, PSYCHIATRIC DISORDERS Anxiety Bipolar Disorder  Neuromuscular disease    GI/Hepatic hiatal hernia, GERD  Controlled,  Endo/Other    Renal/GU Renal disease     Musculoskeletal  (+) Arthritis , Fibromyalgia -  Abdominal   Peds  Hematology  (+) anemia ,   Anesthesia Other Findings   Reproductive/Obstetrics                             Anesthesia Physical Anesthesia Plan  ASA: III  Anesthesia Plan: General   Post-op Pain Management:    Induction: Intravenous  Airway Management Planned:   Additional Equipment:   Intra-op Plan:   Post-operative Plan:   Informed Consent: I have reviewed the patients History and Physical, chart, labs and discussed the procedure including the risks, benefits and alternatives for the proposed anesthesia with the patient or authorized representative who has indicated his/her understanding and acceptance.     Plan Discussed with: CRNA  Anesthesia Plan Comments:         Anesthesia Quick Evaluation

## 2017-01-19 ENCOUNTER — Other Ambulatory Visit: Payer: Self-pay | Admitting: Psychiatry

## 2017-01-19 ENCOUNTER — Ambulatory Visit
Admission: RE | Admit: 2017-01-19 | Discharge: 2017-01-19 | Disposition: A | Discharge status: Home / Self Care | Payer: 59 | Source: Ambulatory Visit | Attending: Psychiatry | Admitting: Psychiatry

## 2017-01-19 ENCOUNTER — Encounter: Payer: Self-pay | Admitting: Anesthesiology

## 2017-01-19 DIAGNOSIS — Z87442 Personal history of urinary calculi: Secondary | ICD-10-CM | POA: Diagnosis not present

## 2017-01-19 DIAGNOSIS — J45909 Unspecified asthma, uncomplicated: Secondary | ICD-10-CM | POA: Diagnosis not present

## 2017-01-19 DIAGNOSIS — F17210 Nicotine dependence, cigarettes, uncomplicated: Secondary | ICD-10-CM | POA: Insufficient documentation

## 2017-01-19 DIAGNOSIS — Z79899 Other long term (current) drug therapy: Secondary | ICD-10-CM | POA: Diagnosis not present

## 2017-01-19 DIAGNOSIS — K219 Gastro-esophageal reflux disease without esophagitis: Secondary | ICD-10-CM | POA: Insufficient documentation

## 2017-01-19 DIAGNOSIS — Z792 Long term (current) use of antibiotics: Secondary | ICD-10-CM | POA: Diagnosis not present

## 2017-01-19 DIAGNOSIS — M069 Rheumatoid arthritis, unspecified: Secondary | ICD-10-CM | POA: Diagnosis not present

## 2017-01-19 DIAGNOSIS — F339 Major depressive disorder, recurrent, unspecified: Secondary | ICD-10-CM | POA: Diagnosis present

## 2017-01-19 DIAGNOSIS — F411 Generalized anxiety disorder: Secondary | ICD-10-CM | POA: Diagnosis not present

## 2017-01-19 DIAGNOSIS — F332 Major depressive disorder, recurrent severe without psychotic features: Secondary | ICD-10-CM

## 2017-01-19 MED ORDER — ONDANSETRON HCL 4 MG/2ML IJ SOLN
INTRAMUSCULAR | Status: AC
  Filled 2017-01-19: qty 2

## 2017-01-19 MED ORDER — SODIUM CHLORIDE 0.9 % IV SOLN
500.0000 mL | Freq: Once | INTRAVENOUS | Status: AC
  Administered 2017-01-19: 1000 mL via INTRAVENOUS

## 2017-01-19 MED ORDER — FENTANYL CITRATE (PF) 100 MCG/2ML IJ SOLN: 25.0000 ug | INTRAMUSCULAR | Status: DC | PRN

## 2017-01-19 MED ORDER — KETOROLAC TROMETHAMINE 30 MG/ML IJ SOLN
INTRAMUSCULAR | Status: AC
  Filled 2017-01-19: qty 1

## 2017-01-19 MED ORDER — SODIUM CHLORIDE 0.9 % IV SOLN
INTRAVENOUS | Status: DC | PRN
  Administered 2017-01-19: 10:00:00 via INTRAVENOUS

## 2017-01-19 MED ORDER — ONDANSETRON HCL 4 MG/2ML IJ SOLN
4.0000 mg | Freq: Once | INTRAMUSCULAR | Status: AC
  Administered 2017-01-19: 4 mg via INTRAVENOUS

## 2017-01-19 MED ORDER — METHOHEXITAL SODIUM 100 MG/10ML IV SOSY
PREFILLED_SYRINGE | INTRAVENOUS | Status: DC | PRN
  Administered 2017-01-19: 100 mg via INTRAVENOUS

## 2017-01-19 MED ORDER — SUCCINYLCHOLINE CHLORIDE 20 MG/ML IJ SOLN
INTRAMUSCULAR | Status: DC | PRN
  Administered 2017-01-19: 110 mg via INTRAVENOUS

## 2017-01-19 MED ORDER — ONDANSETRON HCL 4 MG/2ML IJ SOLN: 4.0000 mg | Freq: Once | INTRAMUSCULAR | Status: DC | PRN

## 2017-01-19 MED ORDER — KETOROLAC TROMETHAMINE 30 MG/ML IJ SOLN
30.0000 mg | Freq: Once | INTRAMUSCULAR | Status: AC
  Administered 2017-01-19: 30 mg via INTRAVENOUS

## 2017-01-19 MED ORDER — MIDAZOLAM HCL 2 MG/2ML IJ SOLN
INTRAMUSCULAR | Status: AC
  Filled 2017-01-19: qty 2

## 2017-01-19 MED ORDER — MIDAZOLAM HCL 2 MG/2ML IJ SOLN: 2.0000 mg | Freq: Once | INTRAMUSCULAR | Status: DC

## 2017-01-19 MED ORDER — MIDAZOLAM HCL 2 MG/2ML IJ SOLN
INTRAMUSCULAR | Status: DC | PRN
  Administered 2017-01-19: 2 mg via INTRAVENOUS

## 2017-01-19 NOTE — Anesthesia Preprocedure Evaluation (Signed)
Anesthesia Evaluation  Patient identified by MRN, date of birth, ID band Patient awake    Reviewed: Allergy & Precautions, NPO status , Patient's Chart, lab work & pertinent test results, reviewed documented beta blocker date and time   History of Anesthesia Complications (+) PONV and history of anesthetic complications  Airway Mallampati: III  TM Distance: >3 FB     Dental  (+) Chipped   Pulmonary asthma , neg COPD, neg recent URI, Current Smoker,    breath sounds clear to auscultation- rhonchi (-) wheezing      Cardiovascular Exercise Tolerance: Good (-) hypertension(-) CAD and (-) Past MI  Rhythm:Regular Rate:Normal - Systolic murmurs and - Diastolic murmurs    Neuro/Psych  Headaches, PSYCHIATRIC DISORDERS Anxiety Bipolar Disorder  Neuromuscular disease    GI/Hepatic hiatal hernia, GERD  Controlled,  Endo/Other  negative endocrine ROSneg diabetes  Renal/GU Renal disease: hx of nephrolithiasis.     Musculoskeletal  (+) Arthritis , Fibromyalgia -  Abdominal (+) + obese,   Peds  Hematology  (+) anemia ,   Anesthesia Other Findings Past Medical History: No date: Asthma No date: Bipolar 1 disorder ([facility]) No date: Fibromyalgia No date: GAD (generalized anxiety disorder) No date: GERD (gastroesophageal reflux disease) No date: Hiatal hernia No date: History of kidney stones No date: History of primary hyperparathyroidism     Comment: s/p  bilateral inferior parathyroidectomy               08-03-2010 No date: IBS (irritable bowel syndrome) No date: Left ureteral stone No date: Migraine No date: PONV (postoperative nausea and vomiting)     Comment: and claustrophobic with mask No date: RA (rheumatoid arthritis) ([place])     Comment: dr Gavin Pound-- rheumtologist No date: Self mutilating behavior No date: Urgency of urination No date: Wears glasses   Reproductive/Obstetrics                             Anesthesia Physical  Anesthesia Plan  ASA: III  Anesthesia Plan: General   Post-op Pain Management:    Induction: Intravenous  Airway Management Planned: Mask  Additional Equipment:   Intra-op Plan:   Post-operative Plan:   Informed Consent: I have reviewed the patients History and Physical, chart, labs and discussed the procedure including the risks, benefits and alternatives for the proposed anesthesia with the patient or authorized representative who has indicated his/her understanding and acceptance.   Dental advisory given  Plan Discussed with: CRNA and Anesthesiologist  Anesthesia Plan Comments:        Anesthesia Quick Evaluation

## 2017-01-19 NOTE — H&P (Signed)
Jordan Russell is an 46 y.o. female.   Chief Complaint: Feeling better. No physical complaints. HPI: History of severe recurrent depression that has improvement with ECT moving into what might become an early maintenance phase  Past Medical History:  Diagnosis Date  . Asthma   . Bipolar 1 disorder ([place])   . Fibromyalgia   . GAD (generalized anxiety disorder)   . GERD (gastroesophageal reflux disease)   . Hiatal hernia   . History of kidney stones   . History of primary hyperparathyroidism    s/p  bilateral inferior parathyroidectomy 08-03-2010  . IBS (irritable bowel syndrome)   . Left ureteral stone   . Migraine   . PONV (postoperative nausea and vomiting)    and claustrophobic with mask  . RA (rheumatoid arthritis) ([place])    dr Gavin Pound-- rheumtologist  . Self mutilating behavior   . Urgency of urination   . Wears glasses     Past Surgical History:  Procedure Laterality Date  . ABDOMINAL HYSTERECTOMY    . BALLOON DILATION N/A 10/01/2014   Procedure: BALLOON DILATION;  Surgeon: Garlan Fair, MD;  Location: [facility];  Service: Endoscopy;  Laterality: N/A;  . CYSTO/  BILATERAL RETROGRADE PYELOGRAM  11/20/2000  . CYSTO/  RIGHT RETROGRADE PYELOGRAM/  RIGHT URETEROSCOPY/  STENT PLACEMENT  12/16/2006  . CYSTOSCOPY/URETEROSCOPY/HOLMIUM LASER/STENT PLACEMENT Left 11/09/2016   Procedure: CYSTOSCOPY/URETEROSCOPY/HOLMIUM LASER/STENT PLACEMENT;  Surgeon: Nickie Retort, MD;  Location: [facility];  Service: Urology;  Laterality: Left;  90 MINS  [phone]   . ESOPHAGEAL MANOMETRY N/A 12/23/2014   Procedure: ESOPHAGEAL MANOMETRY (EM);  Surgeon: Garlan Fair, MD;  Location: [facility];  Service: Endoscopy;  Laterality: N/A;  . ESOPHAGOGASTRODUODENOSCOPY  last one 03-28-2015  . ESOPHAGOGASTRODUODENOSCOPY (EGD) WITH PROPOFOL N/A 10/01/2014   Procedure: ESOPHAGOGASTRODUODENOSCOPY (EGD) WITH PROPOFOL;  Surgeon: Garlan Fair, MD;  Location: [facility];  Service: Endoscopy;  Laterality: N/A;  . EXTRACORPOREAL SHOCK WAVE LITHOTRIPSY  multiple times since age 83  . HOLMIUM LASER APPLICATION Left 05/01/2457   Procedure: HOLMIUM LASER APPLICATION;  Surgeon: Nickie Retort, MD;  Location: [facility];  Service: Urology;  Laterality: Left;  . KNEE ARTHROSCOPY Right 03-04-2014  [facility]   w/ Arthrotomy  . LAPAROSCOPIC ASSISTED VAGINAL HYSTERECTOMY  12/28/2005  . LAPAROSCOPIC CHOLECYSTECTOMY  01/17/2004  . LAPAROSCOPY LEFT OVARIAN CYSTECTOMY/  BILATERAL TUBAL LIGATION  02/26/2003  . LEFT URETEROSCOPIC STONE EXTRACTION /  STENT PLACEMENT  07/09/2002  . NECK EXPLORATION/  BILATERAL INFERIOR PARATHYROIDECTOMY  08/03/2010  . RIGHT URETERAL DILATION/  URETEROSCOPIC STONE EXTRACTION  06/12/2010  . ROUX-EN-Y GASTRIC BYPASS  2001  . SOLYX TRANSURETHRAL SLING/  POSTERIOR PELVIC FLOOR SACROSPINOUS REPAIR  02/21/2009   and Cysto/  Bilateral ureteral stent placement  . TONSILLECTOMY AND ADENOIDECTOMY    . WRIST GANGLION EXCISION Right 01/28/2000    Family History  Problem Relation Age of Onset  . Bipolar disorder Son   . Anxiety disorder Brother   . Depression Brother    Social History:  reports that she has been smoking Cigarettes.  She has a 5.00 pack-year smoking history. She has never used smokeless tobacco. She reports that she does not drink alcohol or use drugs.  Allergies:  Allergies  Allergen Reactions  . Lamictal [Lamotrigine] Nausea And Vomiting and Other (See Comments)    Tremors, diplopia  . Penicillins Hives and Itching    As a child, "breathing, itching problem" Has patient had a PCN reaction causing immediate  rash, facial/tongue/throat swelling, SOB or lightheadedness with hypotension: Yes Has patient had a PCN reaction causing severe rash involving mucus membranes or skin necrosis: Yes Has patient had a PCN reaction that required hospitalization No Has patient had a PCN reaction occurring within the last  10 years: No If all of the above answers are "NO", then may proceed with Cephalosporin use.   . Sulfa Antibiotics Hives  . Morphine And Related Itching and Rash     (Not in a hospital admission)  No results found for this or any previous visit (from the past 48 hour(s)). No results found.  Review of Systems  Constitutional: Negative.   HENT: Negative.   Eyes: Negative.   Respiratory: Negative.   Cardiovascular: Negative.   Gastrointestinal: Negative.   Musculoskeletal: Negative.   Skin: Negative.   Neurological: Negative.   Psychiatric/Behavioral: Negative.     Blood pressure 123/78, pulse 91, temperature 98.7 F (37.1 C), temperature source Oral, resp. rate 18, height 5\' 7"  (1.702 m), weight 103 kg (227 lb), SpO2 100 %. Physical Exam  Nursing note and vitals reviewed. Constitutional: She appears well-developed and well-nourished.  HENT:  Head: Normocephalic and atraumatic.  Eyes: Conjunctivae are normal. Pupils are equal, round, and reactive to light.  Neck: Normal range of motion.  Cardiovascular: Regular rhythm and normal heart sounds.   Respiratory: Effort normal. No respiratory distress.  GI: Soft.  Musculoskeletal: Normal range of motion.  Neurological: She is alert.  Skin: Skin is warm and dry.  Psychiatric: She has a normal mood and affect. Her behavior is normal. Judgment and thought content normal.     Assessment/Plan Follow-up on Friday to complete this week.  [name], MD 01/19/2017, 10:43 AM

## 2017-01-19 NOTE — Transfer of Care (Signed)
Immediate Anesthesia Transfer of Care Note  Patient: Jordan Russell  Procedure(s) Performed: ECT   Patient Location: PACU  Anesthesia Type:General  Level of Consciousness: awake  Airway & Oxygen Therapy: Patient connected to face mask oxygen  Post-op Assessment: Post -op Vital signs reviewed and stable  Post vital signs: stable  Last Vitals:  Vitals:   01/19/17 0830 01/19/17 1100  BP: 123/78 134/83  Pulse: 91 98  Resp: 18   Temp: 37.1 C 36.7 C    Last Pain:  Vitals:   01/19/17 1100  TempSrc: Temporal         Complications: No apparent anesthesia complications

## 2017-01-19 NOTE — Procedures (Signed)
ECT SERVICES Physician's Interval Evaluation & Treatment Note  Patient Identification: FATIMAH SUNDQUIST MRN:  [id number] Date of Evaluation:  01/19/2017 TX #: 8  MADRS:   MMSE:   P.E. Findings:  No change to physical exam heart and lungs normal vitals unremarkable  Psychiatric Interval Note:  Mood improved memory no worse  Subjective:  Patient is a 46 y.o. female seen for evaluation for Electroconvulsive Therapy. Feeling slightly better  Treatment Summary:   [x]   Right Unilateral             []  Bilateral   % Energy : 0.3 ms 75%   Impedance: 1750 ohms  Seizure Energy Index: 17,218 V squared  Postictal Suppression Index: 87%  Seizure Concordance Index: 92%  Medications  Pre Shock: Zofran 4 mg Toradol 30 mg Brevital 100 mg succinylcholine 110 mg  Post Shock: Versed 2 mg  Seizure Duration: 14 seconds by EMG 27 seconds by EEG   Comments: Next treatment Friday   Lungs:  [x]   Clear to auscultation               []  Other:   Heart:    [x]   Regular rhythm             []  irregular rhythm    [x]   Previous H&P reviewed, patient examined and there are NO CHANGES                 []   Previous H&P reviewed, patient examined and there are changes noted.   [name], MD 3/21/201810:44 AM

## 2017-01-19 NOTE — Discharge Instructions (Addendum)
1)  The drugs that you have been given will stay in your system until tomorrow so for the       next 24 hours you should not:  A. Drive an automobile  B. Make any legal decisions  C. Drink any alcoholic beverages  2)  You may resume your regular meals upon return home.  3)  A responsible adult must take you home.  Someone should stay with you for a few          hours, then be available by phone for the remainder of the treatment day.  4)  You May experience any of the following symptoms:  Headache, Nausea and a dry mouth (due to the medications you were given),  temporary memory loss and some confusion, or sore muscles (a warm bath  should help this).  If you you experience any of these symptoms let us know on                your return visit.  5)  Report any of the following: any acute discomfort, severe headache, or temperature        greater than 100.5 F.   Also report any unusual redness, swelling, drainage, or pain         at your IV site.    You may report Symptoms to:  [place] [facility]          Phone: [phone], ECT Department           or Dr. Prescott Gum office [phone]  6)  Your next ECT Treatment is Friday March 23  We will call 2 days prior to your scheduled appointment for arrival times.  7)  Nothing to eat or drink after midnight the night before your procedure.  8)  Take Flomax, Wellbutrin, Omeprazole     With a sip of water the morning of your procedure.  9)  Other Instructions: Call [phone] to cancel the morning of your procedure due         to illness or emergency.  10) We will call within 72 hours to assess how you are feeling.

## 2017-01-19 NOTE — Anesthesia Post-op Follow-up Note (Cosign Needed)
Anesthesia QCDR form completed.        

## 2017-01-19 NOTE — Anesthesia Postprocedure Evaluation (Signed)
Anesthesia Post Note  Patient: Jordan Russell  Procedure(s) Performed: * No procedures listed *  Patient location during evaluation: PACU Anesthesia Type: General Level of consciousness: awake and alert Pain management: pain level controlled Vital Signs Assessment: post-procedure vital signs reviewed and stable Respiratory status: spontaneous breathing, nonlabored ventilation and respiratory function stable Cardiovascular status: blood pressure returned to baseline and stable Postop Assessment: no signs of nausea or vomiting Anesthetic complications: no     Last Vitals:  Vitals:   01/19/17 1120 01/19/17 1134  BP: 107/64 124/74  Pulse:  88  Resp:  18  Temp: 36.5 C     Last Pain:  Vitals:   01/19/17 1120  TempSrc:   PainSc: 0-No pain                 Lakima Dona

## 2017-01-21 ENCOUNTER — Other Ambulatory Visit: Payer: Self-pay | Admitting: Psychiatry

## 2017-01-21 ENCOUNTER — Encounter (HOSPITAL_BASED_OUTPATIENT_CLINIC_OR_DEPARTMENT_OTHER)
Admission: RE | Admit: 2017-01-21 | Discharge: 2017-01-21 | Disposition: A | Discharge status: Home / Self Care | Payer: 59 | Source: Ambulatory Visit | Attending: Psychiatry | Admitting: Psychiatry

## 2017-01-21 ENCOUNTER — Encounter: Payer: Self-pay | Admitting: Anesthesiology

## 2017-01-21 DIAGNOSIS — F332 Major depressive disorder, recurrent severe without psychotic features: Secondary | ICD-10-CM

## 2017-01-21 MED ORDER — SUCCINYLCHOLINE CHLORIDE 200 MG/10ML IV SOSY
PREFILLED_SYRINGE | INTRAVENOUS | Status: DC | PRN
  Administered 2017-01-21: 110 mg via INTRAVENOUS

## 2017-01-21 MED ORDER — KETOROLAC TROMETHAMINE 30 MG/ML IJ SOLN
30.0000 mg | Freq: Once | INTRAMUSCULAR | Status: AC
  Administered 2017-01-21: 30 mg via INTRAVENOUS

## 2017-01-21 MED ORDER — SODIUM CHLORIDE 0.9 % IV SOLN
500.0000 mL | Freq: Once | INTRAVENOUS | Status: AC
  Administered 2017-01-21: 09:00:00 via INTRAVENOUS

## 2017-01-21 MED ORDER — ONDANSETRON HCL 4 MG/2ML IJ SOLN
INTRAMUSCULAR | Status: AC
  Filled 2017-01-21: qty 2

## 2017-01-21 MED ORDER — SUCCINYLCHOLINE CHLORIDE 20 MG/ML IJ SOLN
INTRAMUSCULAR | Status: AC
  Filled 2017-01-21: qty 1

## 2017-01-21 MED ORDER — MIDAZOLAM HCL 2 MG/2ML IJ SOLN: 2.0000 mg | Freq: Once | INTRAMUSCULAR | Status: DC

## 2017-01-21 MED ORDER — MIDAZOLAM HCL 2 MG/2ML IJ SOLN
INTRAMUSCULAR | Status: DC | PRN
  Administered 2017-01-21: 2 mg via INTRAVENOUS

## 2017-01-21 MED ORDER — ONDANSETRON HCL 4 MG/2ML IJ SOLN: 4.0000 mg | Freq: Once | INTRAMUSCULAR | Status: DC | PRN

## 2017-01-21 MED ORDER — SODIUM CHLORIDE 0.9 % IV SOLN
INTRAVENOUS | Status: DC | PRN
Start: 2017-01-21 — End: 2017-01-21
  Administered 2017-01-21: 10:00:00 via INTRAVENOUS

## 2017-01-21 MED ORDER — KETOROLAC TROMETHAMINE 30 MG/ML IJ SOLN
INTRAMUSCULAR | Status: AC
  Administered 2017-01-21: 30 mg via INTRAVENOUS
  Filled 2017-01-21: qty 1

## 2017-01-21 MED ORDER — FENTANYL CITRATE (PF) 100 MCG/2ML IJ SOLN: 25.0000 ug | INTRAMUSCULAR | Status: DC | PRN

## 2017-01-21 MED ORDER — MIDAZOLAM HCL 2 MG/2ML IJ SOLN
INTRAMUSCULAR | Status: AC
  Filled 2017-01-21: qty 2

## 2017-01-21 MED ORDER — ONDANSETRON HCL 4 MG/2ML IJ SOLN
4.0000 mg | Freq: Once | INTRAMUSCULAR | Status: AC
  Administered 2017-01-21: 4 mg via INTRAVENOUS

## 2017-01-21 MED ORDER — METHOHEXITAL SODIUM 100 MG/10ML IV SOSY
PREFILLED_SYRINGE | INTRAVENOUS | Status: DC | PRN
  Administered 2017-01-21: 100 mg via INTRAVENOUS

## 2017-01-21 NOTE — Anesthesia Preprocedure Evaluation (Signed)
Anesthesia Evaluation  Patient identified by MRN, date of birth, ID band Patient awake    Reviewed: Allergy & Precautions, H&P , NPO status , Patient's Chart, lab work & pertinent test results, reviewed documented beta blocker date and time   History of Anesthesia Complications (+) PONV and history of anesthetic complications  Airway Mallampati: II   Neck ROM: full    Dental  (+) Poor Dentition   Pulmonary neg pulmonary ROS, asthma , Current Smoker,    Pulmonary exam normal        Cardiovascular + Peripheral Vascular Disease  negative cardio ROS Normal cardiovascular exam Rhythm:regular Rate:Normal     Neuro/Psych  Headaches, PSYCHIATRIC DISORDERS Anxiety Bipolar Disorder  Neuromuscular disease negative neurological ROS  negative psych ROS   GI/Hepatic negative GI ROS, Neg liver ROS, hiatal hernia, GERD  Medicated,  Endo/Other  negative endocrine ROS  Renal/GU Renal diseasenegative Renal ROS  negative genitourinary   Musculoskeletal  (+) Arthritis , Rheumatoid disorders,  Fibromyalgia -  Abdominal   Peds  Hematology negative hematology ROS (+) anemia ,   Anesthesia Other Findings Past Medical History: No date: Asthma No date: Bipolar 1 disorder (HCC) No date: Fibromyalgia No date: GAD (generalized anxiety disorder) No date: GERD (gastroesophageal reflux disease) No date: Hiatal hernia No date: History of kidney stones No date: History of primary hyperparathyroidism     Comment: s/p  bilateral inferior parathyroidectomy               08-03-2010 No date: IBS (irritable bowel syndrome) No date: Left ureteral stone No date: Migraine No date: PONV (postoperative nausea and vomiting)     Comment: and claustrophobic with mask No date: RA (rheumatoid arthritis) ([place])     Comment: dr Gavin Pound-- rheumtologist No date: Self mutilating behavior No date: Urgency of urination No date: Wears glasses Past  Surgical History: No date: ABDOMINAL HYSTERECTOMY 10/01/2014: BALLOON DILATION N/A     Comment: Procedure: BALLOON DILATION;  Surgeon: Garlan Fair, MD;  Location: [facility];                Service: Endoscopy;  Laterality: N/A; 11/20/2000: CYSTO/  BILATERAL RETROGRADE PYELOGRAM 12/16/2006: CYSTO/  RIGHT RETROGRADE PYELOGRAM/  RIGHT URE* 11/09/2016: CYSTOSCOPY/URETEROSCOPY/HOLMIUM LASER/STENT PL* Left     Comment: Procedure: CYSTOSCOPY/URETEROSCOPY/HOLMIUM               LASER/STENT PLACEMENT;  Surgeon: Nickie Retort, MD;  Location: [facility];  Service: Urology;  Laterality: Left;                90 MINS  [phone]  12/23/2014: ESOPHAGEAL MANOMETRY N/A     Comment: Procedure: ESOPHAGEAL MANOMETRY (EM);                Surgeon: Garlan Fair, MD;  Location: [facility];  Service: Endoscopy;  Laterality:               N/A; last one 03-28-2015: ESOPHAGOGASTRODUODENOSCOPY 10/01/2014: ESOPHAGOGASTRODUODENOSCOPY (EGD) WITH PROPOFOL N/A     Comment: Procedure: ESOPHAGOGASTRODUODENOSCOPY (EGD)               WITH PROPOFOL;  Surgeon: Garlan Fair, MD;  Location: [facility];  Service: Endoscopy;                Laterality: N/A; multiple times since age 36: [place] LITHOTRIPSY 11/09/2016: HOLMIUM LASER APPLICATION Left     Comment: Procedure: HOLMIUM LASER APPLICATION;                Surgeon: Nickie Retort, MD;  Location:               [facility];  Service: Urology;               Laterality: Left; 03-04-2014  [facility]: KNEE ARTHROSCOPY Right     Comment: w/ Arthrotomy 12/28/2005: LAPAROSCOPIC ASSISTED VAGINAL HYSTERECTOMY 01/17/2004: LAPAROSCOPIC CHOLECYSTECTOMY 02/26/2003: LAPAROSCOPY LEFT OVARIAN CYSTECTOMY/  BILATERA* 07/09/2002: LEFT URETEROSCOPIC STONE EXTRACTION /  STENT P* 08/03/2010: NECK EXPLORATION/  BILATERAL INFERIOR PARATHYR* 06/12/2010:  RIGHT URETERAL DILATION/  URETEROSCOPIC STONE * 2001: ROUX-EN-Y GASTRIC BYPASS 02/21/2009: SOLYX TRANSURETHRAL SLING/  POSTERIOR PELVIC F*     Comment: and Cysto/  Bilateral ureteral stent placement No date: TONSILLECTOMY AND ADENOIDECTOMY 01/28/2000: WRIST GANGLION EXCISION Right BMI    Body Mass Index:  35.40 kg/m     Reproductive/Obstetrics negative OB ROS                             Anesthesia Physical Anesthesia Plan  ASA: III  Anesthesia Plan: General   Post-op Pain Management:    Induction:   Airway Management Planned:   Additional Equipment:   Intra-op Plan:   Post-operative Plan:   Informed Consent: I have reviewed the patients History and Physical, chart, labs and discussed the procedure including the risks, benefits and alternatives for the proposed anesthesia with the patient or authorized representative who has indicated his/her understanding and acceptance.   Dental Advisory Given  Plan Discussed with: CRNA  Anesthesia Plan Comments:         Anesthesia Quick Evaluation

## 2017-01-21 NOTE — Discharge Instructions (Signed)
1)  The drugs that you have been given will stay in your system until tomorrow so for the       next 24 hours you should not:  A. Drive an automobile  B. Make any legal decisions  C. Drink any alcoholic beverages  2)  You may resume your regular meals upon return home.  3)  A responsible adult must take you home.  Someone should stay with you for a few          hours, then be available by phone for the remainder of the treatment day.  4)  You May experience any of the following symptoms:  Headache, Nausea and a dry mouth (due to the medications you were given),  temporary memory loss and some confusion, or sore muscles (a warm bath  should help this).  If you you experience any of these symptoms let us know on                your return visit.  5)  Report any of the following: any acute discomfort, severe headache, or temperature        greater than 100.5 F.   Also report any unusual redness, swelling, drainage, or pain         at your IV site.    You may report Symptoms to:  [place] [facility]          Phone: [phone], ECT Department           or Dr. Prescott Gum office [phone]  6)  Your next ECT Treatment is Day Monday  Date January 31 2017  We will call 2 days prior to your scheduled appointment for arrival times.  7)  Nothing to eat or drink after midnight the night before your procedure.  8)  Take prilosec and wellbutrin    With a sip of water the morning of your procedure.  9)  Other Instructions: Call [phone] to cancel the morning of your procedure due         to illness or emergency.  10) We will call within 72 hours to assess how you are feeling.

## 2017-01-21 NOTE — Procedures (Signed)
ECT SERVICES Physician's Interval Evaluation & Treatment Note  Patient Identification: Jordan Russell MRN:  [id number] Date of Evaluation:  01/21/2017 TX #: 9  MADRS:   MMSE:   P.E. Findings:  No change to physical exam. Vitals unremarkable heart and lungs normal.  Psychiatric Interval Note:  Mood is clearly improved and stable  Subjective:  Patient is a 46 y.o. female seen for evaluation for Electroconvulsive Therapy. No specific complaints today  Treatment Summary:   [x]   Right Unilateral             []  Bilateral   % Energy : 0.3 ms 75%   Impedance: 1510 ohms  Seizure Energy Index: 11,563 V squared  Postictal Suppression Index: 87%  Seizure Concordance Index: No reading  Medications  Pre Shock: Zofran 4 mg Toradol 30 mg Brevital 100 mg succinylcholine 110 mg   Post Shock: Versed 2 mg  Seizure Duration: 9 seconds by EMG, 9 seconds by EEG   Comments: Very short seizure. Don't know why. She is feeling better and we will see her back on April 2. Increase to 100% at that time   Lungs:  [x]   Clear to auscultation               []  Other:   Heart:    [x]   Regular rhythm             []  irregular rhythm    [x]   Previous H&P reviewed, patient examined and there are NO CHANGES                 []   Previous H&P reviewed, patient examined and there are changes noted.   [name], MD 3/23/201810:26 AM

## 2017-01-21 NOTE — Anesthesia Post-op Follow-up Note (Cosign Needed)
Anesthesia QCDR form completed.        

## 2017-01-21 NOTE — Transfer of Care (Signed)
Immediate Anesthesia Transfer of Care Note  Patient: Jordan Russell  Procedure(s) Performed: ECT  Patient Location: PACU  Anesthesia Type:General  Level of Consciousness: sedated  Airway & Oxygen Therapy: Patient Spontanous Breathing and Patient connected to face mask oxygen  Post-op Assessment: Report given to RN and Post -op Vital signs reviewed and stable  Post vital signs: Reviewed and stable  Last Vitals:  Vitals:   01/21/17 0852 01/21/17 1045  BP: 120/74 121/61  Pulse: 76 94  Resp: 17 19  Temp: 36.6 C 36.4 C    Last Pain:  Vitals:   01/21/17 1045  TempSrc:   PainSc: (P) Asleep         Complications: No apparent anesthesia complications

## 2017-01-21 NOTE — H&P (Signed)
Jordan Russell is an 46 y.o. female.   Chief Complaint: No specific complaint other than nausea with the last treatment HPI: Recurrent severe depression which seems to be doing better now with a return of a couple of ECT treatments  Past Medical History:  Diagnosis Date  . Asthma   . Bipolar 1 disorder ([place])   . Fibromyalgia   . GAD (generalized anxiety disorder)   . GERD (gastroesophageal reflux disease)   . Hiatal hernia   . History of kidney stones   . History of primary hyperparathyroidism    s/p  bilateral inferior parathyroidectomy 08-03-2010  . IBS (irritable bowel syndrome)   . Left ureteral stone   . Migraine   . PONV (postoperative nausea and vomiting)    and claustrophobic with mask  . RA (rheumatoid arthritis) ([place])    dr Gavin Pound-- rheumtologist  . Self mutilating behavior   . Urgency of urination   . Wears glasses     Past Surgical History:  Procedure Laterality Date  . ABDOMINAL HYSTERECTOMY    . BALLOON DILATION N/A 10/01/2014   Procedure: BALLOON DILATION;  Surgeon: Garlan Fair, MD;  Location: [facility];  Service: Endoscopy;  Laterality: N/A;  . CYSTO/  BILATERAL RETROGRADE PYELOGRAM  11/20/2000  . CYSTO/  RIGHT RETROGRADE PYELOGRAM/  RIGHT URETEROSCOPY/  STENT PLACEMENT  12/16/2006  . CYSTOSCOPY/URETEROSCOPY/HOLMIUM LASER/STENT PLACEMENT Left 11/09/2016   Procedure: CYSTOSCOPY/URETEROSCOPY/HOLMIUM LASER/STENT PLACEMENT;  Surgeon: Nickie Retort, MD;  [facility];  Service: Urology;  Laterality: Left;  90 MINS  [phone]   . ESOPHAGEAL MANOMETRY N/A 12/23/2014   Procedure: ESOPHAGEAL MANOMETRY (EM);  Surgeon: Garlan Fair, MD;  Location: [facility];  Service: Endoscopy;  Laterality: N/A;  . ESOPHAGOGASTRODUODENOSCOPY  last one 03-28-2015  . ESOPHAGOGASTRODUODENOSCOPY (EGD) WITH PROPOFOL N/A 10/01/2014   Procedure: ESOPHAGOGASTRODUODENOSCOPY (EGD) WITH PROPOFOL;  Surgeon: Garlan Fair, MD;  Location: [facility];  Service: Endoscopy;  Laterality: N/A;  . EXTRACORPOREAL SHOCK WAVE LITHOTRIPSY  multiple times since age 60  . HOLMIUM LASER APPLICATION Left 07/06/6386   Procedure: HOLMIUM LASER APPLICATION;  Surgeon: Nickie Retort, MD;  Location: [facility];  Service: Urology;  Laterality: Left;  . KNEE ARTHROSCOPY Right 03-04-2014  [facility]   w/ Arthrotomy  . LAPAROSCOPIC ASSISTED VAGINAL HYSTERECTOMY  12/28/2005  . LAPAROSCOPIC CHOLECYSTECTOMY  01/17/2004  . LAPAROSCOPY LEFT OVARIAN CYSTECTOMY/  BILATERAL TUBAL LIGATION  02/26/2003  . LEFT URETEROSCOPIC STONE EXTRACTION /  STENT PLACEMENT  07/09/2002  . NECK EXPLORATION/  BILATERAL INFERIOR PARATHYROIDECTOMY  08/03/2010  . RIGHT URETERAL DILATION/  URETEROSCOPIC STONE EXTRACTION  06/12/2010  . ROUX-EN-Y GASTRIC BYPASS  2001  . SOLYX TRANSURETHRAL SLING/  POSTERIOR PELVIC FLOOR SACROSPINOUS REPAIR  02/21/2009   and Cysto/  Bilateral ureteral stent placement  . TONSILLECTOMY AND ADENOIDECTOMY    . WRIST GANGLION EXCISION Right 01/28/2000    Family History  Problem Relation Age of Onset  . Bipolar disorder Son   . Anxiety disorder Brother   . Depression Brother    Social History:  reports that she has been smoking Cigarettes.  She has a 5.00 pack-year smoking history. She has never used smokeless tobacco. She reports that she does not drink alcohol or use drugs.  Allergies:  Allergies  Allergen Reactions  . Lamictal [Lamotrigine] Nausea And Vomiting and Other (See Comments)    Tremors, diplopia  . Penicillins Hives and Itching    As a child, "breathing, itching problem" Has patient had  a PCN reaction causing immediate rash, facial/tongue/throat swelling, SOB or lightheadedness with hypotension: Yes Has patient had a PCN reaction causing severe rash involving mucus membranes or skin necrosis: Yes Has patient had a PCN reaction that required hospitalization No Has patient had a PCN reaction occurring within the last  10 years: No If all of the above answers are "NO", then may proceed with Cephalosporin use.   . Sulfa Antibiotics Hives  . Morphine And Related Itching and Rash     (Not in a hospital admission)  No results found for this or any previous visit (from the past 48 hour(s)). No results found.  Review of Systems  Constitutional: Negative.   HENT: Negative.   Eyes: Negative.   Respiratory: Negative.   Cardiovascular: Negative.   Gastrointestinal: Negative.   Musculoskeletal: Negative.   Skin: Negative.   Neurological: Negative.   Psychiatric/Behavioral: Negative.     Blood pressure 120/74, pulse 76, temperature 97.8 F (36.6 C), temperature source Oral, resp. rate 17, height 5\' 7"  (1.702 m), weight 102.5 kg (226 lb), SpO2 100 %. Physical Exam  Nursing note and vitals reviewed. Constitutional: She appears well-developed and well-nourished.  HENT:  Head: Normocephalic and atraumatic.  Eyes: Conjunctivae are normal. Pupils are equal, round, and reactive to light.  Neck: Normal range of motion.  Cardiovascular: Regular rhythm and normal heart sounds.   Respiratory: Effort normal. No respiratory distress.  GI: Soft.  Musculoskeletal: Normal range of motion.  Neurological: She is alert.  Skin: Skin is warm and dry.  Psychiatric: She has a normal mood and affect. Her speech is normal and behavior is normal. Judgment and thought content normal. Cognition and memory are normal.     Assessment/Plan We will see her back in 9 days on April 2  [name], MD 01/21/2017, 10:24 AM

## 2017-01-21 NOTE — Anesthesia Procedure Notes (Signed)
Date/Time: 01/21/2017 10:34 AM Performed by: Dionne Bucy Pre-anesthesia Checklist: Patient identified, Emergency Drugs available, Suction available and Patient being monitored Patient Re-evaluated:Patient Re-evaluated prior to inductionOxygen Delivery Method: Circle system utilized Preoxygenation: Pre-oxygenation with 100% oxygen Intubation Type: IV induction Ventilation: Mask ventilation without difficulty and Mask ventilation throughout procedure Airway Equipment and Method: Bite block Placement Confirmation: positive ETCO2 Dental Injury: Teeth and Oropharynx as per pre-operative assessment

## 2017-01-21 NOTE — Anesthesia Postprocedure Evaluation (Signed)
Anesthesia Post Note  Patient: Jordan Russell  Procedure(s) Performed: * No procedures listed *  Patient location during evaluation: PACU Anesthesia Type: General Level of consciousness: awake and alert Pain management: pain level controlled Vital Signs Assessment: post-procedure vital signs reviewed and stable Respiratory status: spontaneous breathing, nonlabored ventilation, respiratory function stable and patient connected to nasal cannula oxygen Cardiovascular status: blood pressure returned to baseline and stable Postop Assessment: no signs of nausea or vomiting Anesthetic complications: no     Last Vitals:  Vitals:   01/21/17 0852 01/21/17 1045  BP: 120/74 121/61  Pulse: 76 94  Resp: 17 19  Temp: 36.6 C 36.4 C    Last Pain:  Vitals:   01/21/17 1045  TempSrc:   PainSc: Asleep                 [name]

## 2017-01-31 ENCOUNTER — Encounter: Payer: Self-pay | Admitting: Anesthesiology

## 2017-01-31 ENCOUNTER — Encounter
Admission: RE | Admit: 2017-01-31 | Discharge: 2017-01-31 | Disposition: A | Discharge status: Home / Self Care | Payer: 59 | Source: Ambulatory Visit | Attending: Psychiatry | Admitting: Psychiatry

## 2017-01-31 ENCOUNTER — Other Ambulatory Visit: Payer: Self-pay | Admitting: Psychiatry

## 2017-01-31 DIAGNOSIS — F411 Generalized anxiety disorder: Secondary | ICD-10-CM | POA: Diagnosis not present

## 2017-01-31 DIAGNOSIS — Z9884 Bariatric surgery status: Secondary | ICD-10-CM | POA: Insufficient documentation

## 2017-01-31 DIAGNOSIS — K219 Gastro-esophageal reflux disease without esophagitis: Secondary | ICD-10-CM | POA: Insufficient documentation

## 2017-01-31 DIAGNOSIS — F332 Major depressive disorder, recurrent severe without psychotic features: Secondary | ICD-10-CM

## 2017-01-31 DIAGNOSIS — F338 Other recurrent depressive disorders: Secondary | ICD-10-CM | POA: Diagnosis not present

## 2017-01-31 DIAGNOSIS — K449 Diaphragmatic hernia without obstruction or gangrene: Secondary | ICD-10-CM | POA: Insufficient documentation

## 2017-01-31 DIAGNOSIS — G43909 Migraine, unspecified, not intractable, without status migrainosus: Secondary | ICD-10-CM | POA: Diagnosis not present

## 2017-01-31 DIAGNOSIS — K589 Irritable bowel syndrome without diarrhea: Secondary | ICD-10-CM | POA: Diagnosis not present

## 2017-01-31 DIAGNOSIS — M797 Fibromyalgia: Secondary | ICD-10-CM | POA: Insufficient documentation

## 2017-01-31 DIAGNOSIS — F319 Bipolar disorder, unspecified: Secondary | ICD-10-CM | POA: Insufficient documentation

## 2017-01-31 DIAGNOSIS — M069 Rheumatoid arthritis, unspecified: Secondary | ICD-10-CM | POA: Diagnosis not present

## 2017-01-31 DIAGNOSIS — Z87442 Personal history of urinary calculi: Secondary | ICD-10-CM | POA: Insufficient documentation

## 2017-01-31 DIAGNOSIS — J45909 Unspecified asthma, uncomplicated: Secondary | ICD-10-CM | POA: Diagnosis not present

## 2017-01-31 DIAGNOSIS — F17210 Nicotine dependence, cigarettes, uncomplicated: Secondary | ICD-10-CM | POA: Diagnosis not present

## 2017-01-31 MED ORDER — KETOROLAC TROMETHAMINE 30 MG/ML IJ SOLN
INTRAMUSCULAR | Status: AC
  Administered 2017-01-31: 30 mg via INTRAVENOUS
  Filled 2017-01-31: qty 1

## 2017-01-31 MED ORDER — SODIUM CHLORIDE 0.9 % IV SOLN
500.0000 mL | Freq: Once | INTRAVENOUS | Status: AC
  Administered 2017-01-31: 1000 mL via INTRAVENOUS

## 2017-01-31 MED ORDER — SUCCINYLCHOLINE CHLORIDE 20 MG/ML IJ SOLN
INTRAMUSCULAR | Status: DC | PRN
  Administered 2017-01-31: 110 mg via INTRAVENOUS

## 2017-01-31 MED ORDER — KETOROLAC TROMETHAMINE 30 MG/ML IJ SOLN
30.0000 mg | Freq: Once | INTRAMUSCULAR | Status: AC
  Administered 2017-01-31: 30 mg via INTRAVENOUS

## 2017-01-31 MED ORDER — METHOHEXITAL SODIUM 0.5 G IJ SOLR
INTRAMUSCULAR | Status: AC
  Filled 2017-01-31: qty 500

## 2017-01-31 MED ORDER — ONDANSETRON HCL 4 MG/2ML IJ SOLN
4.0000 mg | Freq: Once | INTRAMUSCULAR | Status: AC
  Administered 2017-01-31: 4 mg via INTRAVENOUS

## 2017-01-31 MED ORDER — SODIUM CHLORIDE 0.9 % IV SOLN
INTRAVENOUS | Status: DC | PRN
  Administered 2017-01-31: 11:00:00 via INTRAVENOUS

## 2017-01-31 MED ORDER — MIDAZOLAM HCL 2 MG/2ML IJ SOLN
INTRAMUSCULAR | Status: AC
Start: 2017-01-31 — End: 2017-01-31
  Filled 2017-01-31: qty 2

## 2017-01-31 MED ORDER — METHOHEXITAL SODIUM 100 MG/10ML IV SOSY
PREFILLED_SYRINGE | INTRAVENOUS | Status: DC | PRN
  Administered 2017-01-31: 90 mg via INTRAVENOUS

## 2017-01-31 MED ORDER — ONDANSETRON HCL 4 MG/2ML IJ SOLN
INTRAMUSCULAR | Status: AC
  Administered 2017-01-31: 4 mg via INTRAVENOUS
  Filled 2017-01-31: qty 2

## 2017-01-31 MED ORDER — SUCCINYLCHOLINE CHLORIDE 20 MG/ML IJ SOLN
INTRAMUSCULAR | Status: AC
  Filled 2017-01-31: qty 1

## 2017-01-31 MED ORDER — MIDAZOLAM HCL 2 MG/2ML IJ SOLN
INTRAMUSCULAR | Status: DC | PRN
  Administered 2017-01-31: 2 mg via INTRAVENOUS

## 2017-01-31 NOTE — Anesthesia Post-op Follow-up Note (Cosign Needed)
Anesthesia QCDR form completed.        

## 2017-01-31 NOTE — Procedures (Signed)
ECT SERVICES Physician's Interval Evaluation & Treatment Note  Patient Identification: Jordan Russell MRN:  [id number] Date of Evaluation:  01/31/2017 TX #: 10  MADRS: 29  MMSE: 27  P.E. Findings:  No change to physical exam. Vitals unremarkable heart and lungs normal  Psychiatric Interval Note:  Mood subjectively a little more down and not suicidal not psychotic  Subjective:  Patient is a 46 y.o. female seen for evaluation for Electroconvulsive Therapy. Slightly more down  Treatment Summary:   [x]   Right Unilateral             []  Bilateral   % Energy : 0.3 100%   Impedance: 2220 ohms  Seizure Energy Index: 16,747 V squared  Postictal Suppression Index: 67%  Seizure Concordance Index: 98%  Medications  Pre Shock: Zofran 4 mg Toradol 30 mg Brevital 90 mg succinylcholine 100 mg  Post Shock: Versed 2 mg  Seizure Duration: 32 seconds by EMG, 56 seconds by EEG   Comments: Follow-up one week on April 9   Lungs:  [x]   Clear to auscultation               []  Other:   Heart:    [x]   Regular rhythm             []  irregular rhythm    [x]   Previous H&P reviewed, patient examined and there are NO CHANGES                 []   Previous H&P reviewed, patient examined and there are changes noted.   [name], MD 4/2/201810:44 AM

## 2017-01-31 NOTE — Anesthesia Preprocedure Evaluation (Signed)
Anesthesia Evaluation  Patient identified by MRN, date of birth, ID band Patient awake    Reviewed: Allergy & Precautions, NPO status , Patient's Chart, lab work & pertinent test results, reviewed documented beta blocker date and time   History of Anesthesia Complications (+) PONV and history of anesthetic complications  Airway Mallampati: III  TM Distance: >3 FB     Dental  (+) Chipped   Pulmonary asthma , neg COPD, neg recent URI, Current Smoker,    breath sounds clear to auscultation- rhonchi (-) wheezing      Cardiovascular Exercise Tolerance: Good (-) hypertension(-) CAD and (-) Past MI  Rhythm:Regular Rate:Normal - Systolic murmurs and - Diastolic murmurs    Neuro/Psych  Headaches, PSYCHIATRIC DISORDERS Anxiety Bipolar Disorder  Neuromuscular disease    GI/Hepatic hiatal hernia, GERD  Controlled,  Endo/Other  negative endocrine ROSneg diabetes  Renal/GU Renal disease: hx of nephrolithiasis.     Musculoskeletal  (+) Arthritis , Fibromyalgia -  Abdominal (+) + obese,   Peds  Hematology  (+) anemia ,   Anesthesia Other Findings Past Medical History: No date: Asthma No date: Bipolar 1 disorder (HCC) No date: Fibromyalgia No date: GAD (generalized anxiety disorder) No date: GERD (gastroesophageal reflux disease) No date: Hiatal hernia No date: History of kidney stones No date: History of primary hyperparathyroidism     Comment: s/p  bilateral inferior parathyroidectomy               08-03-2010 No date: IBS (irritable bowel syndrome) No date: Left ureteral stone No date: Migraine No date: PONV (postoperative nausea and vomiting)     Comment: and claustrophobic with mask No date: RA (rheumatoid arthritis) ([place])     Comment: dr Gavin Pound-- rheumtologist No date: Self mutilating behavior No date: Urgency of urination No date: Wears glasses   Reproductive/Obstetrics                              Anesthesia Physical  Anesthesia Plan  ASA: III  Anesthesia Plan: General   Post-op Pain Management:    Induction: Intravenous  Airway Management Planned: Mask  Additional Equipment:   Intra-op Plan:   Post-operative Plan:   Informed Consent: I have reviewed the patients History and Physical, chart, labs and discussed the procedure including the risks, benefits and alternatives for the proposed anesthesia with the patient or authorized representative who has indicated his/her understanding and acceptance.   Dental advisory given  Plan Discussed with: CRNA and Anesthesiologist  Anesthesia Plan Comments:         Anesthesia Quick Evaluation

## 2017-01-31 NOTE — Anesthesia Postprocedure Evaluation (Signed)
Anesthesia Post Note  Patient: Jordan Russell  Procedure(s) Performed: * No procedures listed *  Patient location during evaluation: PACU Anesthesia Type: General Level of consciousness: awake and alert Pain management: pain level controlled Vital Signs Assessment: post-procedure vital signs reviewed and stable Respiratory status: spontaneous breathing, nonlabored ventilation and respiratory function stable Cardiovascular status: blood pressure returned to baseline and stable Postop Assessment: no signs of nausea or vomiting Anesthetic complications: no     Last Vitals:  Vitals:   01/31/17 1111 01/31/17 1121  BP: 126/74 116/73  Pulse: 79 79  Resp: 16 15  Temp:      Last Pain:  Vitals:   01/31/17 1121  TempSrc:   PainSc: 0-No pain                 Meredyth Hornung

## 2017-01-31 NOTE — H&P (Signed)
Jordan Russell is an 46 y.o. female.   Chief Complaint: Patient has been feeling a little more down and nervous. Occasional nausea. No suicidal thought HPI: History of recurrent severe depression responding to ECT but requiring fairly frequent treatments  Past Medical History:  Diagnosis Date  . Asthma   . Bipolar 1 disorder ([place])   . Fibromyalgia   . GAD (generalized anxiety disorder)   . GERD (gastroesophageal reflux disease)   . Hiatal hernia   . History of kidney stones   . History of primary hyperparathyroidism    s/p  bilateral inferior parathyroidectomy 08-03-2010  . IBS (irritable bowel syndrome)   . Left ureteral stone   . Migraine   . PONV (postoperative nausea and vomiting)    and claustrophobic with mask  . RA (rheumatoid arthritis) ([place])    dr Gavin Pound-- rheumtologist  . Self mutilating behavior   . Urgency of urination   . Wears glasses     Past Surgical History:  Procedure Laterality Date  . ABDOMINAL HYSTERECTOMY    . BALLOON DILATION N/A 10/01/2014   Procedure: BALLOON DILATION;  Surgeon: Garlan Fair, MD;  Location: [facility];  Service: Endoscopy;  Laterality: N/A;  . CYSTO/  BILATERAL RETROGRADE PYELOGRAM  11/20/2000  . CYSTO/  RIGHT RETROGRADE PYELOGRAM/  RIGHT URETEROSCOPY/  STENT PLACEMENT  12/16/2006  . CYSTOSCOPY/URETEROSCOPY/HOLMIUM LASER/STENT PLACEMENT Left 11/09/2016   Procedure: CYSTOSCOPY/URETEROSCOPY/HOLMIUM LASER/STENT PLACEMENT;  Surgeon: Nickie Retort, MD;  Location: [facility];  Service: Urology;  Laterality: Left;  90 MINS  [phone]   . ESOPHAGEAL MANOMETRY N/A 12/23/2014   Procedure: ESOPHAGEAL MANOMETRY (EM);  Surgeon: Garlan Fair, MD;  Location: [facility];  Service: Endoscopy;  Laterality: N/A;  . ESOPHAGOGASTRODUODENOSCOPY  last one 03-28-2015  . ESOPHAGOGASTRODUODENOSCOPY (EGD) WITH PROPOFOL N/A 10/01/2014   Procedure: ESOPHAGOGASTRODUODENOSCOPY (EGD) WITH PROPOFOL;  Surgeon: Garlan Fair, MD;  Location: [facility];  Service: Endoscopy;  Laterality: N/A;  . EXTRACORPOREAL SHOCK WAVE LITHOTRIPSY  multiple times since age 7  . HOLMIUM LASER APPLICATION Left 05/10/239   Procedure: HOLMIUM LASER APPLICATION;  Surgeon: Nickie Retort, MD;  Location: [facility];  Service: Urology;  Laterality: Left;  . KNEE ARTHROSCOPY Right 03-04-2014  [facility]   w/ Arthrotomy  . LAPAROSCOPIC ASSISTED VAGINAL HYSTERECTOMY  12/28/2005  . LAPAROSCOPIC CHOLECYSTECTOMY  01/17/2004  . LAPAROSCOPY LEFT OVARIAN CYSTECTOMY/  BILATERAL TUBAL LIGATION  02/26/2003  . LEFT URETEROSCOPIC STONE EXTRACTION /  STENT PLACEMENT  07/09/2002  . NECK EXPLORATION/  BILATERAL INFERIOR PARATHYROIDECTOMY  08/03/2010  . RIGHT URETERAL DILATION/  URETEROSCOPIC STONE EXTRACTION  06/12/2010  . ROUX-EN-Y GASTRIC BYPASS  2001  . SOLYX TRANSURETHRAL SLING/  POSTERIOR PELVIC FLOOR SACROSPINOUS REPAIR  02/21/2009   and Cysto/  Bilateral ureteral stent placement  . TONSILLECTOMY AND ADENOIDECTOMY    . WRIST GANGLION EXCISION Right 01/28/2000    Family History  Problem Relation Age of Onset  . Bipolar disorder Son   . Anxiety disorder Brother   . Depression Brother    Social History:  reports that she has been smoking Cigarettes.  She has a 5.00 pack-year smoking history. She has never used smokeless tobacco. She reports that she does not drink alcohol or use drugs.  Allergies:  Allergies  Allergen Reactions  . Lamictal [Lamotrigine] Nausea And Vomiting and Other (See Comments)    Tremors, diplopia  . Penicillins Hives and Itching    As a child, "breathing, itching problem" Has patient had a  PCN reaction causing immediate rash, facial/tongue/throat swelling, SOB or lightheadedness with hypotension: Yes Has patient had a PCN reaction causing severe rash involving mucus membranes or skin necrosis: Yes Has patient had a PCN reaction that required hospitalization No Has patient had a PCN reaction  occurring within the last 10 years: No If all of the above answers are "NO", then may proceed with Cephalosporin use.   . Sulfa Antibiotics Hives  . Morphine And Related Itching and Rash     (Not in a hospital admission)  No results found for this or any previous visit (from the past 48 hour(s)). No results found.  Review of Systems  Constitutional: Negative.   HENT: Negative.   Eyes: Negative.   Respiratory: Negative.   Cardiovascular: Negative.   Gastrointestinal: Positive for nausea.  Musculoskeletal: Negative.   Skin: Negative.   Neurological: Negative.   Psychiatric/Behavioral: Positive for depression. Negative for hallucinations, memory loss, substance abuse and suicidal ideas. The patient is not nervous/anxious and does not have insomnia.     Blood pressure 112/75, pulse 78, temperature 97.8 F (36.6 C), temperature source Oral, resp. rate 16, height 5\' 7"  (1.702 m), weight 102.5 kg (226 lb), SpO2 100 %. Physical Exam  Nursing note and vitals reviewed. Constitutional: She appears well-developed and well-nourished.  HENT:  Head: Normocephalic and atraumatic.  Eyes: Conjunctivae are normal. Pupils are equal, round, and reactive to light.  Neck: Normal range of motion.  Cardiovascular: Regular rhythm and normal heart sounds.   Respiratory: Effort normal. No respiratory distress.  GI: Soft.  Musculoskeletal: Normal range of motion.  Neurological: She is alert.  Skin: Skin is warm and dry.  Psychiatric: She has a normal mood and affect. Her speech is normal. Judgment normal. She is not withdrawn. Cognition and memory are normal. She expresses no homicidal and no suicidal ideation.     Assessment/Plan Treatment today follow-up one week  [name], MD 01/31/2017, 10:42 AM

## 2017-01-31 NOTE — Transfer of Care (Signed)
Immediate Anesthesia Transfer of Care Note  Patient: Jordan Russell  Procedure(s) Performed: * No procedures listed *  Patient Location: PACU  Anesthesia Type:General  Level of Consciousness: sedated  Airway & Oxygen Therapy: Patient Spontanous Breathing and Patient connected to face mask oxygen  Post-op Assessment: Report given to RN and Post -op Vital signs reviewed and stable  Post vital signs: Reviewed and stable  Last Vitals:  Vitals:   01/31/17 0921  BP: 112/75  Pulse: 78  Resp: 16  Temp: 36.6 C    Last Pain:  Vitals:   01/31/17 0921  TempSrc: Oral  PainSc: 0-No pain         Complications: No apparent anesthesia complications

## 2017-01-31 NOTE — Discharge Instructions (Signed)
1)  The drugs that you have been given will stay in your system until tomorrow so for the       next 24 hours you should not:  A. Drive an automobile  B. Make any legal decisions  C. Drink any alcoholic beverages  2)  You may resume your regular meals upon return home.  3)  A responsible adult must take you home.  Someone should stay with you for a few          hours, then be available by phone for the remainder of the treatment day.  4)  You May experience any of the following symptoms:  Headache, Nausea and a dry mouth (due to the medications you were given),  temporary memory loss and some confusion, or sore muscles (a warm bath  should help this).  If you you experience any of these symptoms let us know on                your return visit.  5)  Report any of the following: any acute discomfort, severe headache, or temperature        greater than 100.5 F.   Also report any unusual redness, swelling, drainage, or pain         at your IV site.    You may report Symptoms to:  [place] at [facility]          Phone: [phone], ECT Department           or Dr. Prescott Gum office [phone]  6)  Your next ECT Treatment is Day  Monday  Date  February 07, 2017  We will call 2 days prior to your scheduled appointment for arrival times.  7)  Nothing to eat or drink after midnight the night before your procedure.  8)  Take Prilosec, Wellbutrin     With a sip of water the morning of your procedure.  9)  Other Instructions: Call [phone] to cancel the morning of your procedure due         to illness or emergency.  10) We will call within 72 hours to assess how you are feeling.

## 2017-01-31 NOTE — Anesthesia Procedure Notes (Signed)
Performed by: Stefanee Mckell Pre-anesthesia Checklist: Patient identified, Patient being monitored, Timeout performed, Emergency Drugs available and Suction available Patient Re-evaluated:Patient Re-evaluated prior to inductionOxygen Delivery Method: Circle system utilized Preoxygenation: Pre-oxygenation with 100% oxygen Ventilation: Mask ventilation without difficulty Airway Equipment and Method: Bite block Dental Injury: Teeth and Oropharynx as per pre-operative assessment        

## 2017-02-07 ENCOUNTER — Encounter (HOSPITAL_BASED_OUTPATIENT_CLINIC_OR_DEPARTMENT_OTHER)
Admission: RE | Admit: 2017-02-07 | Discharge: 2017-02-07 | Disposition: A | Discharge status: Home / Self Care | Payer: 59 | Source: Ambulatory Visit | Attending: Psychiatry | Admitting: Psychiatry

## 2017-02-07 ENCOUNTER — Encounter: Payer: Self-pay | Admitting: Certified Registered Nurse Anesthetist

## 2017-02-07 ENCOUNTER — Other Ambulatory Visit: Payer: Self-pay | Admitting: Psychiatry

## 2017-02-07 DIAGNOSIS — F332 Major depressive disorder, recurrent severe without psychotic features: Secondary | ICD-10-CM

## 2017-02-07 DIAGNOSIS — F338 Other recurrent depressive disorders: Secondary | ICD-10-CM | POA: Diagnosis not present

## 2017-02-07 MED ORDER — SUCCINYLCHOLINE CHLORIDE 20 MG/ML IJ SOLN
INTRAMUSCULAR | Status: DC | PRN
  Administered 2017-02-07: 110 mg via INTRAVENOUS

## 2017-02-07 MED ORDER — METHOHEXITAL SODIUM 100 MG/10ML IV SOSY
PREFILLED_SYRINGE | INTRAVENOUS | Status: DC | PRN
Start: 2017-02-07 — End: 2017-02-07
  Administered 2017-02-07: 90 mg via INTRAVENOUS

## 2017-02-07 MED ORDER — SODIUM CHLORIDE 0.9 % IV SOLN
500.0000 mL | Freq: Once | INTRAVENOUS | Status: AC
  Administered 2017-02-07: 1000 mL via INTRAVENOUS

## 2017-02-07 MED ORDER — SUCCINYLCHOLINE CHLORIDE 20 MG/ML IJ SOLN
INTRAMUSCULAR | Status: AC
  Filled 2017-02-07: qty 1

## 2017-02-07 MED ORDER — MIDAZOLAM HCL 2 MG/2ML IJ SOLN
INTRAMUSCULAR | Status: AC
  Filled 2017-02-07: qty 2

## 2017-02-07 MED ORDER — KETOROLAC TROMETHAMINE 30 MG/ML IJ SOLN
30.0000 mg | Freq: Once | INTRAMUSCULAR | Status: AC
  Administered 2017-02-07: 30 mg via INTRAVENOUS

## 2017-02-07 MED ORDER — MIDAZOLAM HCL 2 MG/2ML IJ SOLN
INTRAMUSCULAR | Status: DC | PRN
  Administered 2017-02-07: 2 mg via INTRAVENOUS

## 2017-02-07 MED ORDER — SODIUM CHLORIDE 0.9 % IV SOLN
INTRAVENOUS | Status: DC | PRN
  Administered 2017-02-07: 10:00:00 via INTRAVENOUS

## 2017-02-07 MED ORDER — METHOHEXITAL SODIUM 0.5 G IJ SOLR
INTRAMUSCULAR | Status: AC
Start: 2017-02-07 — End: 2017-02-07
  Filled 2017-02-07: qty 500

## 2017-02-07 MED ORDER — KETOROLAC TROMETHAMINE 30 MG/ML IJ SOLN
INTRAMUSCULAR | Status: AC
  Administered 2017-02-07: 30 mg via INTRAVENOUS
  Filled 2017-02-07: qty 1

## 2017-02-07 MED ORDER — ONDANSETRON HCL 4 MG/2ML IJ SOLN
INTRAMUSCULAR | Status: AC
  Administered 2017-02-07: 4 mg via INTRAVENOUS
  Filled 2017-02-07: qty 2

## 2017-02-07 MED ORDER — ONDANSETRON HCL 4 MG/2ML IJ SOLN
4.0000 mg | Freq: Once | INTRAMUSCULAR | Status: AC
  Administered 2017-02-07: 4 mg via INTRAVENOUS

## 2017-02-07 NOTE — Anesthesia Postprocedure Evaluation (Signed)
Anesthesia Post Note  Patient: Jordan Russell  Procedure(s) Performed: * No procedures listed *  Patient location during evaluation: PACU Anesthesia Type: General Level of consciousness: awake and alert Pain management: pain level controlled Vital Signs Assessment: post-procedure vital signs reviewed and stable Respiratory status: spontaneous breathing, nonlabored ventilation and respiratory function stable Cardiovascular status: blood pressure returned to baseline and stable Postop Assessment: no signs of nausea or vomiting Anesthetic complications: no     Last Vitals:  Vitals:   02/07/17 1057 02/07/17 1103  BP: 107/64 111/70  Pulse: 76 75  Resp: 15 18  Temp: 36.6 C     Last Pain:  Vitals:   02/07/17 1040  TempSrc:   PainSc: Asleep                 Maire Govan

## 2017-02-07 NOTE — Discharge Instructions (Signed)
1)  The drugs that you have been given will stay in your system until tomorrow so for the       next 24 hours you should not:  A. Drive an automobile  B. Make any legal decisions  C. Drink any alcoholic beverages  2)  You may resume your regular meals upon return home.  3)  A responsible adult must take you home.  Someone should stay with you for a few          hours, then be available by phone for the remainder of the treatment day.  4)  You May experience any of the following symptoms:  Headache, Nausea and a dry mouth (due to the medications you were given),  temporary memory loss and some confusion, or sore muscles (a warm bath  should help this).  If you you experience any of these symptoms let us know on                your return visit.  5)  Report any of the following: any acute discomfort, severe headache, or temperature        greater than 100.5 F.   Also report any unusual redness, swelling, drainage, or pain         at your IV site.    You may report Symptoms to:  [place] [facility]          Phone: [phone], ECT Department           or Dr. Prescott Gum office [phone]  6)  Your next ECT Treatment is Day Friday  Date February 11, 2017  We will call 2 days prior to your scheduled appointment for arrival times.  7)  Nothing to eat or drink after midnight the night before your procedure.  8)  Take Wellbutrin, Omeprazole     With a sip of water the morning of your procedure.  9)  Other Instructions: Call [phone] to cancel the morning of your procedure due         to illness or emergency.  10) We will call within 72 hours to assess how you are feeling.

## 2017-02-07 NOTE — H&P (Signed)
Jordan Russell is an 46 y.o. female.   Chief Complaint: Patient feels like she's been doing worse for about the last 3 days subjectively. No return of self-mutilation HPI: History of recurrent severe depression  Past Medical History:  Diagnosis Date  . Asthma   . Bipolar 1 disorder ([place])   . Fibromyalgia   . GAD (generalized anxiety disorder)   . GERD (gastroesophageal reflux disease)   . Hiatal hernia   . History of kidney stones   . History of primary hyperparathyroidism    s/p  bilateral inferior parathyroidectomy 08-03-2010  . IBS (irritable bowel syndrome)   . Left ureteral stone   . Migraine   . PONV (postoperative nausea and vomiting)    and claustrophobic with mask  . RA (rheumatoid arthritis) ([place])    dr Gavin Pound-- rheumtologist  . Self mutilating behavior   . Urgency of urination   . Wears glasses     Past Surgical History:  Procedure Laterality Date  . ABDOMINAL HYSTERECTOMY    . BALLOON DILATION N/A 10/01/2014   Procedure: BALLOON DILATION;  Surgeon: Garlan Fair, MD;  Location: [facility];  Service: Endoscopy;  Laterality: N/A;  . CYSTO/  BILATERAL RETROGRADE PYELOGRAM  11/20/2000  . CYSTO/  RIGHT RETROGRADE PYELOGRAM/  RIGHT URETEROSCOPY/  STENT PLACEMENT  12/16/2006  . CYSTOSCOPY/URETEROSCOPY/HOLMIUM LASER/STENT PLACEMENT Left 11/09/2016   Procedure: CYSTOSCOPY/URETEROSCOPY/HOLMIUM LASER/STENT PLACEMENT;  Surgeon: Nickie Retort, MD;  Location: [facility];  Service: Urology;  Laterality: Left;  90 MINS  [phone]   . ESOPHAGEAL MANOMETRY N/A 12/23/2014   Procedure: ESOPHAGEAL MANOMETRY (EM);  Surgeon: Garlan Fair, MD;  Location: [facility];  Service: Endoscopy;  Laterality: N/A;  . ESOPHAGOGASTRODUODENOSCOPY  last one 03-28-2015  . ESOPHAGOGASTRODUODENOSCOPY (EGD) WITH PROPOFOL N/A 10/01/2014   Procedure: ESOPHAGOGASTRODUODENOSCOPY (EGD) WITH PROPOFOL;  Surgeon: Garlan Fair, MD;  Location: [facility];  Service:  Endoscopy;  Laterality: N/A;  . EXTRACORPOREAL SHOCK WAVE LITHOTRIPSY  multiple times since age 56  . HOLMIUM LASER APPLICATION Left 12/31/5174   Procedure: HOLMIUM LASER APPLICATION;  Surgeon: Nickie Retort, MD;  Location: [facility];  Service: Urology;  Laterality: Left;  . KNEE ARTHROSCOPY Right 03-04-2014  [facility]   w/ Arthrotomy  . LAPAROSCOPIC ASSISTED VAGINAL HYSTERECTOMY  12/28/2005  . LAPAROSCOPIC CHOLECYSTECTOMY  01/17/2004  . LAPAROSCOPY LEFT OVARIAN CYSTECTOMY/  BILATERAL TUBAL LIGATION  02/26/2003  . LEFT URETEROSCOPIC STONE EXTRACTION /  STENT PLACEMENT  07/09/2002  . NECK EXPLORATION/  BILATERAL INFERIOR PARATHYROIDECTOMY  08/03/2010  . RIGHT URETERAL DILATION/  URETEROSCOPIC STONE EXTRACTION  06/12/2010  . ROUX-EN-Y GASTRIC BYPASS  2001  . SOLYX TRANSURETHRAL SLING/  POSTERIOR PELVIC FLOOR SACROSPINOUS REPAIR  02/21/2009   and Cysto/  Bilateral ureteral stent placement  . TONSILLECTOMY AND ADENOIDECTOMY    . WRIST GANGLION EXCISION Right 01/28/2000    Family History  Problem Relation Age of Onset  . Bipolar disorder Son   . Anxiety disorder Brother   . Depression Brother    Social History:  reports that she has been smoking Cigarettes.  She has a 5.00 pack-year smoking history. She has never used smokeless tobacco. She reports that she does not drink alcohol or use drugs.  Allergies:  Allergies  Allergen Reactions  . Lamictal [Lamotrigine] Nausea And Vomiting and Other (See Comments)    Tremors, diplopia  . Penicillins Hives and Itching    As a child, "breathing, itching problem" Has patient had a PCN reaction causing immediate rash,  facial/tongue/throat swelling, SOB or lightheadedness with hypotension: Yes Has patient had a PCN reaction causing severe rash involving mucus membranes or skin necrosis: Yes Has patient had a PCN reaction that required hospitalization No Has patient had a PCN reaction occurring within the last 10 years: No If all  of the above answers are "NO", then may proceed with Cephalosporin use.   . Sulfa Antibiotics Hives  . Morphine And Related Itching and Rash     (Not in a hospital admission)  No results found for this or any previous visit (from the past 48 hour(s)). No results found.  Review of Systems  Constitutional: Negative.   HENT: Negative.   Eyes: Negative.   Respiratory: Negative.   Cardiovascular: Negative.   Gastrointestinal: Negative.   Musculoskeletal: Negative.   Skin: Negative.   Neurological: Negative.   Psychiatric/Behavioral: Positive for depression. Negative for hallucinations, memory loss, substance abuse and suicidal ideas. The patient is not nervous/anxious and does not have insomnia.     Blood pressure 125/85, pulse 84, temperature 97.9 F (36.6 C), temperature source Oral, resp. rate 16, height 5\' 7"  (1.702 m), weight 101.2 kg (223 lb), SpO2 98 %. Physical Exam  Nursing note and vitals reviewed. Constitutional: She appears well-developed and well-nourished.  HENT:  Head: Normocephalic and atraumatic.  Eyes: Conjunctivae are normal. Pupils are equal, round, and reactive to light.  Neck: Normal range of motion.  Cardiovascular: Regular rhythm and normal heart sounds.   Respiratory: Effort normal. No respiratory distress.  GI: Soft.  Musculoskeletal: Normal range of motion.  Neurological: She is alert.  Skin: Skin is warm and dry.  Psychiatric: Her speech is normal and behavior is normal. Judgment and thought content normal. Her mood appears anxious. Cognition and memory are normal. She exhibits a depressed mood.     Assessment/Plan This week we will try Monday and Friday treatment  [name], MD 02/07/2017, 10:14 AM

## 2017-02-07 NOTE — Anesthesia Post-op Follow-up Note (Cosign Needed)
Anesthesia QCDR form completed.        

## 2017-02-07 NOTE — Anesthesia Procedure Notes (Signed)
Performed by: Daneli Butkiewicz Pre-anesthesia Checklist: Patient identified, Patient being monitored, Timeout performed, Emergency Drugs available and Suction available Patient Re-evaluated:Patient Re-evaluated prior to inductionOxygen Delivery Method: Circle system utilized Preoxygenation: Pre-oxygenation with 100% oxygen Ventilation: Mask ventilation without difficulty Airway Equipment and Method: Bite block Dental Injury: Teeth and Oropharynx as per pre-operative assessment        

## 2017-02-07 NOTE — Transfer of Care (Signed)
Immediate Anesthesia Transfer of Care Note  Patient: Jordan Russell  Procedure(s) Performed: * No procedures listed *  Patient Location: PACU  Anesthesia Type:General  Level of Consciousness: sedated  Airway & Oxygen Therapy: Patient Spontanous Breathing and Patient connected to face mask oxygen  Post-op Assessment: Report given to RN and Post -op Vital signs reviewed and stable  Post vital signs: Reviewed and stable  Last Vitals:  Vitals:   02/07/17 0831 02/07/17 1030  BP: 125/85 (!) (P) 121/56  Pulse: 84 (P) 86  Resp: 16 (P) 20  Temp: 36.6 C (P) 36.6 C    Last Pain:  Vitals:   02/07/17 1030  TempSrc:   PainSc: (P) 0-No pain         Complications: No apparent anesthesia complications

## 2017-02-07 NOTE — Anesthesia Preprocedure Evaluation (Signed)
Anesthesia Evaluation  Patient identified by MRN, date of birth, ID band Patient awake    Reviewed: Allergy & Precautions, NPO status , Patient's Chart, lab work & pertinent test results, reviewed documented beta blocker date and time   History of Anesthesia Complications (+) PONV and history of anesthetic complications  Airway Mallampati: III  TM Distance: >3 FB     Dental  (+) Chipped   Pulmonary asthma , neg COPD, neg recent URI, Current Smoker,    breath sounds clear to auscultation- rhonchi (-) wheezing      Cardiovascular Exercise Tolerance: Good (-) hypertension(-) CAD and (-) Past MI  Rhythm:Regular Rate:Normal - Systolic murmurs and - Diastolic murmurs    Neuro/Psych  Headaches, PSYCHIATRIC DISORDERS Anxiety Bipolar Disorder  Neuromuscular disease    GI/Hepatic hiatal hernia, GERD  Controlled,  Endo/Other  negative endocrine ROSneg diabetes  Renal/GU Renal disease: hx of nephrolithiasis.     Musculoskeletal  (+) Arthritis , Fibromyalgia -  Abdominal (+) + obese,   Peds  Hematology  (+) anemia ,   Anesthesia Other Findings Past Medical History: No date: Asthma No date: Bipolar 1 disorder ([facility]) No date: Fibromyalgia No date: GAD (generalized anxiety disorder) No date: GERD (gastroesophageal reflux disease) No date: Hiatal hernia No date: History of kidney stones No date: History of primary hyperparathyroidism     Comment: s/p  bilateral inferior parathyroidectomy               08-03-2010 No date: IBS (irritable bowel syndrome) No date: Left ureteral stone No date: Migraine No date: PONV (postoperative nausea and vomiting)     Comment: and claustrophobic with mask No date: RA (rheumatoid arthritis) ([place])     Comment: dr Gavin Pound-- rheumtologist No date: Self mutilating behavior No date: Urgency of urination No date: Wears glasses   Reproductive/Obstetrics                              Anesthesia Physical  Anesthesia Plan  ASA: III  Anesthesia Plan: General   Post-op Pain Management:    Induction: Intravenous  Airway Management Planned: Mask  Additional Equipment:   Intra-op Plan:   Post-operative Plan:   Informed Consent: I have reviewed the patients History and Physical, chart, labs and discussed the procedure including the risks, benefits and alternatives for the proposed anesthesia with the patient or authorized representative who has indicated his/her understanding and acceptance.   Dental advisory given  Plan Discussed with: CRNA and Anesthesiologist  Anesthesia Plan Comments:         Anesthesia Quick Evaluation

## 2017-02-07 NOTE — Procedures (Signed)
ECT SERVICES Physician's Interval Evaluation & Treatment Note  Patient Identification: Jordan Russell MRN:  [id number] Date of Evaluation:  02/07/2017 TX #: [id number]  MADRS:   MMSE:   P.E. Findings:  No change to physical exam. No new skin lesions. Vital signs unremarkable heart and lungs normal.  Psychiatric Interval Note:  Subjectively feeling more down and anxious although she does not appear to be particularly different in terms of her affect her behavior.  Subjective:  Patient is a 46 y.o. female seen for evaluation for Electroconvulsive Therapy. No specific new complaint other than that she has been feeling worse since Thursday  Treatment Summary:   [x]   Right Unilateral             []  Bilateral   % Energy : 0.3 ms 100%   Impedance: 1440 ohms  Seizure Energy Index: 12,785 V squared  Postictal Suppression Index: 92%  Seizure Concordance Index: 95%  Medications  Pre Shock: Zofran 4 mg, Toradol 30 mg, Brevital 90 mg, succinylcholine 100 mg  Post Shock: Versed 2 mg  Seizure Duration: EMG 22 seconds, EEG 26 seconds   Comments: Follow-up in 4 days on Friday to try and keep her improvement more stable for the time being   Lungs:  [x]   Clear to auscultation               []  Other:   Heart:    [x]   Regular rhythm             []  irregular rhythm    [x]   Previous H&P reviewed, patient examined and there are NO CHANGES                 []   Previous H&P reviewed, patient examined and there are changes noted.   Jordan Berthold, MD 4/9/201810:15 AM

## 2017-02-09 NOTE — Progress Notes (Signed)
Spoke with the patient as a post operative telephone follow up. The patient stated," I am am truly concerned that my recovery from the ECT treatment is not as timely as it should be. My mother states that I have slurred speech and I am confused a lot of the times." The patient continues and states that her mother called her the other day and the patient asked the mother why she was calling at 6:45 in the morning. The mother replied that it was 6:45 in the evening. The patient realized she had lost all of Tuesday sleeping. In speaking with the patient this nurse detects a slight slurring in her speech. This nurse called Dr. Weber Cooks regarding the patient's concerns. It was suggested that the patient come to ECT on Wednesday April 18th  at 8:30 instead of Friday February 11, 2017. The patient was in agreement with the suggestion and verbalized understanding of the time. This nurse commended the patient on verbalizing her concerns and encouraged her to do so in the future. The patient stated she was comforted by the commendation and the sugguestion.

## 2017-02-14 ENCOUNTER — Telehealth: Payer: Self-pay | Admitting: *Deleted

## 2017-02-16 ENCOUNTER — Other Ambulatory Visit: Payer: Self-pay | Admitting: Psychiatry

## 2017-02-16 ENCOUNTER — Encounter
Admission: RE | Admit: 2017-02-16 | Discharge: 2017-02-16 | Disposition: A | Discharge status: Home / Self Care | Payer: 59 | Source: Ambulatory Visit | Attending: Psychiatry | Admitting: Psychiatry

## 2017-02-16 ENCOUNTER — Encounter: Payer: Self-pay | Admitting: Anesthesiology

## 2017-02-16 DIAGNOSIS — J45909 Unspecified asthma, uncomplicated: Secondary | ICD-10-CM | POA: Insufficient documentation

## 2017-02-16 DIAGNOSIS — K219 Gastro-esophageal reflux disease without esophagitis: Secondary | ICD-10-CM | POA: Insufficient documentation

## 2017-02-16 DIAGNOSIS — Z90710 Acquired absence of both cervix and uterus: Secondary | ICD-10-CM | POA: Diagnosis not present

## 2017-02-16 DIAGNOSIS — G43909 Migraine, unspecified, not intractable, without status migrainosus: Secondary | ICD-10-CM | POA: Insufficient documentation

## 2017-02-16 DIAGNOSIS — K449 Diaphragmatic hernia without obstruction or gangrene: Secondary | ICD-10-CM | POA: Diagnosis not present

## 2017-02-16 DIAGNOSIS — M797 Fibromyalgia: Secondary | ICD-10-CM | POA: Diagnosis not present

## 2017-02-16 DIAGNOSIS — Z87442 Personal history of urinary calculi: Secondary | ICD-10-CM | POA: Diagnosis not present

## 2017-02-16 DIAGNOSIS — K589 Irritable bowel syndrome without diarrhea: Secondary | ICD-10-CM | POA: Insufficient documentation

## 2017-02-16 DIAGNOSIS — Z9884 Bariatric surgery status: Secondary | ICD-10-CM | POA: Diagnosis not present

## 2017-02-16 DIAGNOSIS — F17210 Nicotine dependence, cigarettes, uncomplicated: Secondary | ICD-10-CM | POA: Diagnosis not present

## 2017-02-16 DIAGNOSIS — F332 Major depressive disorder, recurrent severe without psychotic features: Secondary | ICD-10-CM | POA: Diagnosis not present

## 2017-02-16 DIAGNOSIS — F319 Bipolar disorder, unspecified: Secondary | ICD-10-CM | POA: Insufficient documentation

## 2017-02-16 DIAGNOSIS — M069 Rheumatoid arthritis, unspecified: Secondary | ICD-10-CM | POA: Insufficient documentation

## 2017-02-16 DIAGNOSIS — R413 Other amnesia: Secondary | ICD-10-CM | POA: Insufficient documentation

## 2017-02-16 MED ORDER — MIDAZOLAM HCL 2 MG/2ML IJ SOLN
INTRAMUSCULAR | Status: AC
  Filled 2017-02-16: qty 2

## 2017-02-16 MED ORDER — SUCCINYLCHOLINE CHLORIDE 20 MG/ML IJ SOLN
INTRAMUSCULAR | Status: AC
  Filled 2017-02-16: qty 1

## 2017-02-16 MED ORDER — SUCCINYLCHOLINE CHLORIDE 200 MG/10ML IV SOSY
PREFILLED_SYRINGE | INTRAVENOUS | Status: DC | PRN
  Administered 2017-02-16: 110 mg via INTRAVENOUS

## 2017-02-16 MED ORDER — ONDANSETRON HCL 4 MG/2ML IJ SOLN
INTRAMUSCULAR | Status: AC
  Administered 2017-02-16: 4 mg via INTRAVENOUS
  Filled 2017-02-16: qty 2

## 2017-02-16 MED ORDER — MIDAZOLAM HCL 2 MG/2ML IJ SOLN: 2.0000 mg | Freq: Once | INTRAMUSCULAR | Status: DC

## 2017-02-16 MED ORDER — SODIUM CHLORIDE 0.9 % IV SOLN
INTRAVENOUS | Status: DC | PRN
  Administered 2017-02-16: 10:00:00 via INTRAVENOUS

## 2017-02-16 MED ORDER — MIDAZOLAM HCL 2 MG/2ML IJ SOLN
INTRAMUSCULAR | Status: DC | PRN
  Administered 2017-02-16: 2 mg via INTRAVENOUS

## 2017-02-16 MED ORDER — SODIUM CHLORIDE 0.9 % IV SOLN
500.0000 mL | Freq: Once | INTRAVENOUS | Status: AC
  Administered 2017-02-16: 09:00:00 via INTRAVENOUS

## 2017-02-16 MED ORDER — ONDANSETRON HCL 4 MG/2ML IJ SOLN
4.0000 mg | Freq: Once | INTRAMUSCULAR | Status: AC
  Administered 2017-02-16: 4 mg via INTRAVENOUS

## 2017-02-16 MED ORDER — KETOROLAC TROMETHAMINE 30 MG/ML IJ SOLN
INTRAMUSCULAR | Status: AC
  Administered 2017-02-16: 30 mg via INTRAVENOUS
  Filled 2017-02-16: qty 1

## 2017-02-16 MED ORDER — METHOHEXITAL SODIUM 100 MG/10ML IV SOSY
PREFILLED_SYRINGE | INTRAVENOUS | Status: DC | PRN
  Administered 2017-02-16: 90 mg via INTRAVENOUS

## 2017-02-16 MED ORDER — KETOROLAC TROMETHAMINE 30 MG/ML IJ SOLN
30.0000 mg | Freq: Once | INTRAMUSCULAR | Status: AC
  Administered 2017-02-16: 30 mg via INTRAVENOUS

## 2017-02-16 MED ORDER — METHOHEXITAL SODIUM 100 MG/10ML IV SOSY
PREFILLED_SYRINGE | INTRAVENOUS | Status: AC
  Filled 2017-02-16: qty 100

## 2017-02-16 NOTE — Discharge Instructions (Signed)
1)  The drugs that you have been given will stay in your system until tomorrow so for the       next 24 hours you should not:  A. Drive an automobile  B. Make any legal decisions  C. Drink any alcoholic beverages  2)  You may resume your regular meals upon return home.  3)  A responsible adult must take you home.  Someone should stay with you for a few          hours, then be available by phone for the remainder of the treatment day.  4)  You May experience any of the following symptoms:  Headache, Nausea and a dry mouth (due to the medications you were given),  temporary memory loss and some confusion, or sore muscles (a warm bath  should help this).  If you you experience any of these symptoms let us know on                your return visit.  5)  Report any of the following: any acute discomfort, severe headache, or temperature        greater than 100.5 F.   Also report any unusual redness, swelling, drainage, or pain         at your IV site.    You may report Symptoms to:  [place] [facility]          Phone: [phone], ECT Department           or Dr. Prescott Gum office [phone]  6)  Your next ECT Treatment is Day Wednesday  Date April 25th at 830am  We will call 2 days prior to your scheduled appointment for arrival times.  7)  Nothing to eat or drink after midnight the night before your procedure.  8)  Take omeprazole     With a sip of water the morning of your procedure.  9)  Other Instructions: Call [phone] to cancel the morning of your procedure due         to illness or emergency.  10) We will call within 72 hours to assess how you are feeling.

## 2017-02-16 NOTE — Anesthesia Post-op Follow-up Note (Cosign Needed)
Anesthesia QCDR form completed.        

## 2017-02-16 NOTE — H&P (Signed)
Jordan Russell is an 46 y.o. female.   Chief Complaint: Patient is complaining of memory impairment although as we discussed this is been a chronic issue whether she is getting ECT or not. Mood is not perfect but is certainly better than prior to starting treatment. HPI: Recurrent severe depression which has responded ECT at least partially  Past Medical History:  Diagnosis Date  . Asthma   . Bipolar 1 disorder ([place])   . Fibromyalgia   . GAD (generalized anxiety disorder)   . GERD (gastroesophageal reflux disease)   . Hiatal hernia   . History of kidney stones   . History of primary hyperparathyroidism    s/p  bilateral inferior parathyroidectomy 08-03-2010  . IBS (irritable bowel syndrome)   . Left ureteral stone   . Migraine   . PONV (postoperative nausea and vomiting)    and claustrophobic with mask  . RA (rheumatoid arthritis) ([place])    dr Gavin Pound-- rheumtologist  . Self mutilating behavior   . Urgency of urination   . Wears glasses     Past Surgical History:  Procedure Laterality Date  . ABDOMINAL HYSTERECTOMY    . BALLOON DILATION N/A 10/01/2014   Procedure: BALLOON DILATION;  Surgeon: Garlan Fair, MD;  Location: [facility];  Service: Endoscopy;  Laterality: N/A;  . CYSTO/  BILATERAL RETROGRADE PYELOGRAM  11/20/2000  . CYSTO/  RIGHT RETROGRADE PYELOGRAM/  RIGHT URETEROSCOPY/  STENT PLACEMENT  12/16/2006  . CYSTOSCOPY/URETEROSCOPY/HOLMIUM LASER/STENT PLACEMENT Left 11/09/2016   Procedure: CYSTOSCOPY/URETEROSCOPY/HOLMIUM LASER/STENT PLACEMENT;  Surgeon: Nickie Retort, MD;  Location: [facility];  Service: Urology;  Laterality: Left;  90 MINS  [phone]   . ESOPHAGEAL MANOMETRY N/A 12/23/2014   Procedure: ESOPHAGEAL MANOMETRY (EM);  Surgeon: Garlan Fair, MD;  Location: [facility];  Service: Endoscopy;  Laterality: N/A;  . ESOPHAGOGASTRODUODENOSCOPY  last one 03-28-2015  . ESOPHAGOGASTRODUODENOSCOPY (EGD) WITH PROPOFOL N/A  10/01/2014   Procedure: ESOPHAGOGASTRODUODENOSCOPY (EGD) WITH PROPOFOL;  Surgeon: Garlan Fair, MD;  Location: [facility];  Service: Endoscopy;  Laterality: N/A;  . EXTRACORPOREAL SHOCK WAVE LITHOTRIPSY  multiple times since age 63  . HOLMIUM LASER APPLICATION Left 11/02/9415   Procedure: HOLMIUM LASER APPLICATION;  Surgeon: Nickie Retort, MD;  Location: [facility];  Service: Urology;  Laterality: Left;  . KNEE ARTHROSCOPY Right 03-04-2014  [facility]   w/ Arthrotomy  . LAPAROSCOPIC ASSISTED VAGINAL HYSTERECTOMY  12/28/2005  . LAPAROSCOPIC CHOLECYSTECTOMY  01/17/2004  . LAPAROSCOPY LEFT OVARIAN CYSTECTOMY/  BILATERAL TUBAL LIGATION  02/26/2003  . LEFT URETEROSCOPIC STONE EXTRACTION /  STENT PLACEMENT  07/09/2002  . NECK EXPLORATION/  BILATERAL INFERIOR PARATHYROIDECTOMY  08/03/2010  . RIGHT URETERAL DILATION/  URETEROSCOPIC STONE EXTRACTION  06/12/2010  . ROUX-EN-Y GASTRIC BYPASS  2001  . SOLYX TRANSURETHRAL SLING/  POSTERIOR PELVIC FLOOR SACROSPINOUS REPAIR  02/21/2009   and Cysto/  Bilateral ureteral stent placement  . TONSILLECTOMY AND ADENOIDECTOMY    . WRIST GANGLION EXCISION Right 01/28/2000    Family History  Problem Relation Age of Onset  . Bipolar disorder Son   . Anxiety disorder Brother   . Depression Brother    Social History:  reports that she has been smoking Cigarettes.  She has a 5.00 pack-year smoking history. She has never used smokeless tobacco. She reports that she does not drink alcohol or use drugs.  Allergies:  Allergies  Allergen Reactions  . Lamictal [Lamotrigine] Nausea And Vomiting and Other (See Comments)    Tremors, diplopia  .  Penicillins Hives and Itching    As a child, "breathing, itching problem" Has patient had a PCN reaction causing immediate rash, facial/tongue/throat swelling, SOB or lightheadedness with hypotension: Yes Has patient had a PCN reaction causing severe rash involving mucus membranes or skin necrosis: Yes Has  patient had a PCN reaction that required hospitalization No Has patient had a PCN reaction occurring within the last 10 years: No If all of the above answers are "NO", then may proceed with Cephalosporin use.   . Sulfa Antibiotics Hives  . Morphine And Related Itching and Rash     (Not in a hospital admission)  No results found for this or any previous visit (from the past 48 hour(s)). No results found.  Review of Systems  Constitutional: Negative.   HENT: Negative.   Eyes: Negative.   Respiratory: Negative.   Cardiovascular: Negative.   Gastrointestinal: Negative.   Musculoskeletal: Negative.   Skin: Negative.   Neurological: Negative.   Psychiatric/Behavioral: Positive for memory loss. Negative for depression, hallucinations, substance abuse and suicidal ideas. The patient is not nervous/anxious and does not have insomnia.     Blood pressure 129/76, pulse 81, temperature 97.7 F (36.5 C), temperature source Oral, resp. rate 18, height 5\' 7"  (1.702 m), weight 102.5 kg (226 lb), SpO2 99 %. Physical Exam  Nursing note and vitals reviewed. Constitutional: She appears well-developed and well-nourished.  HENT:  Head: Normocephalic and atraumatic.  Eyes: Conjunctivae are normal. Pupils are equal, round, and reactive to light.  Neck: Normal range of motion.  Cardiovascular: Normal heart sounds.   Respiratory: Effort normal.  GI: Soft.  Musculoskeletal: Normal range of motion.  Neurological: She is alert.  Skin: Skin is warm and dry.  Psychiatric: Her speech is normal and behavior is normal. Judgment normal. Her mood appears anxious. She expresses no homicidal and no suicidal ideation. She exhibits abnormal recent memory.     Assessment/Plan Follow-up in 1 week after treatment today.  [name], MD 02/16/2017, 10:11 AM

## 2017-02-16 NOTE — Transfer of Care (Signed)
Immediate Anesthesia Transfer of Care Note  Patient: Jordan Russell  Procedure(s) Performed: ECT  Patient Location: PACU  Anesthesia Type:General  Level of Consciousness: sedated  Airway & Oxygen Therapy: Patient Spontanous Breathing and Patient connected to face mask oxygen  Post-op Assessment: Report given to RN and Post -op Vital signs reviewed and stable  Post vital signs: Reviewed and stable  Last Vitals:  Vitals:   02/16/17 0850 02/16/17 1028  BP: 129/76 (P) 112/73  Pulse: 81 (P) 97  Resp: 18 (P) 20  Temp: 36.5 C (P) 36.8 C    Last Pain:  Vitals:   02/16/17 1028  TempSrc: (P) Temporal         Complications: No apparent anesthesia complications

## 2017-02-16 NOTE — Anesthesia Procedure Notes (Signed)
Date/Time: 02/16/2017 10:17 AM Performed by: Dionne Bucy Pre-anesthesia Checklist: Patient identified, Emergency Drugs available, Suction available and Patient being monitored Patient Re-evaluated:Patient Re-evaluated prior to inductionOxygen Delivery Method: Circle system utilized Preoxygenation: Pre-oxygenation with 100% oxygen Intubation Type: IV induction Ventilation: Mask ventilation without difficulty and Mask ventilation throughout procedure Airway Equipment and Method: Bite block Placement Confirmation: positive ETCO2 Dental Injury: Teeth and Oropharynx as per pre-operative assessment

## 2017-02-16 NOTE — Anesthesia Preprocedure Evaluation (Signed)
Anesthesia Evaluation  Patient identified by MRN, date of birth, ID band Patient awake    Reviewed: Allergy & Precautions, NPO status , Patient's Chart, lab work & pertinent test results, reviewed documented beta blocker date and time   History of Anesthesia Complications (+) PONV and history of anesthetic complications  Airway Mallampati: III  TM Distance: >3 FB     Dental  (+) Chipped   Pulmonary asthma , neg COPD, neg recent URI, Current Smoker,    breath sounds clear to auscultation- rhonchi (-) wheezing      Cardiovascular Exercise Tolerance: Good (-) hypertension+ Peripheral Vascular Disease  (-) CAD and (-) Past MI  Rhythm:Regular Rate:Normal - Systolic murmurs and - Diastolic murmurs    Neuro/Psych  Headaches, PSYCHIATRIC DISORDERS Anxiety Bipolar Disorder  Neuromuscular disease    GI/Hepatic hiatal hernia, GERD  Controlled,  Endo/Other  negative endocrine ROSneg diabetes  Renal/GU Renal disease (hx of nephrolithiasis)     Musculoskeletal  (+) Arthritis , Fibromyalgia -  Abdominal (+) + obese,   Peds  Hematology  (+) anemia ,   Anesthesia Other Findings Past Medical History: No date: Asthma No date: Bipolar 1 disorder (HCC) No date: Fibromyalgia No date: GAD (generalized anxiety disorder) No date: GERD (gastroesophageal reflux disease) No date: Hiatal hernia No date: History of kidney stones No date: History of primary hyperparathyroidism     Comment: s/p  bilateral inferior parathyroidectomy               08-03-2010 No date: IBS (irritable bowel syndrome) No date: Left ureteral stone No date: Migraine No date: PONV (postoperative nausea and vomiting)     Comment: and claustrophobic with mask No date: RA (rheumatoid arthritis) (HCC)     Comment: dr Gavin Pound-- rheumtologist No date: Self mutilating behavior No date: Urgency of urination No date: Wears glasses   Reproductive/Obstetrics                              Anesthesia Physical  Anesthesia Plan  ASA: III  Anesthesia Plan: General   Post-op Pain Management:    Induction: Intravenous  Airway Management Planned: Mask  Additional Equipment:   Intra-op Plan:   Post-operative Plan:   Informed Consent: I have reviewed the patients History and Physical, chart, labs and discussed the procedure including the risks, benefits and alternatives for the proposed anesthesia with the patient or authorized representative who has indicated his/her understanding and acceptance.   Dental advisory given  Plan Discussed with: CRNA and Anesthesiologist  Anesthesia Plan Comments:         Anesthesia Quick Evaluation

## 2017-02-16 NOTE — Procedures (Signed)
ECT SERVICES Physician's Interval Evaluation & Treatment Note  Patient Identification: Jordan Russell MRN:  [id number] Date of Evaluation:  02/16/2017 TX #: [id number]  MADRS:   MMSE:   P.E. Findings:  No change to physical exam heart and lungs normal vitals unremarkable  Psychiatric Interval Note:  Mood is feeling better at least not suicidal not hurting herself. Complains of memory impairment but a lot of it is baseline  Subjective:  Patient is a 46 y.o. female seen for evaluation for Electroconvulsive Therapy. Memory impairment is the big complaint  Treatment Summary:   [x]   Right Unilateral             []  Bilateral   % Energy : 0.3 ms 100%   Impedance: 1570 ohms  Seizure Energy Index: 12,208 V squared  Postictal Suppression Index: 87%  Seizure Concordance Index: 74%  Medications  Pre Shock: Zofran 4 mg Toradol 30 mg Brevital 90 mg succinylcholine 100 mg  Post Shock: Versed 2 mg  Seizure Duration: 9 seconds by EMG 38 seconds by EEG   Comments: We discussed the efficacy and the patient strongly feels this remains helpful. We will see her back in 1 week. It sounds like the memory impairment is probably no worse than her baseline cognitive impairment. May be just more noticeable when she is feeling better emotionally.   Lungs:  [x]   Clear to auscultation               []  Other:   Heart:    [x]   Regular rhythm             []  irregular rhythm    [x]   Previous H&P reviewed, patient examined and there are NO CHANGES                 []   Previous H&P reviewed, patient examined and there are changes noted.   Jordan Berthold, MD 4/18/201810:13 AM

## 2017-02-16 NOTE — Anesthesia Postprocedure Evaluation (Signed)
Anesthesia Post Note  Patient: Jordan Russell  Procedure(s) Performed: * No procedures listed *  Patient location during evaluation: PACU Anesthesia Type: General Level of consciousness: awake and alert Pain management: pain level controlled Vital Signs Assessment: post-procedure vital signs reviewed and stable Respiratory status: spontaneous breathing, nonlabored ventilation, respiratory function stable and patient connected to nasal cannula oxygen Cardiovascular status: blood pressure returned to baseline and stable Postop Assessment: no signs of nausea or vomiting Anesthetic complications: no     Last Vitals:  Vitals:   02/16/17 1108 02/16/17 1127  BP: 99/64 104/82  Pulse: 73 85  Resp: 12 16  Temp:  37 C    Last Pain:  Vitals:   02/16/17 1127  TempSrc: Tympanic  PainSc:                  Precious Haws Lilyannah Zuelke

## 2017-02-21 ENCOUNTER — Telehealth: Payer: Self-pay | Admitting: *Deleted

## 2017-02-22 ENCOUNTER — Other Ambulatory Visit: Payer: Self-pay | Admitting: Psychiatry

## 2017-02-23 ENCOUNTER — Encounter: Payer: Self-pay | Admitting: Anesthesiology

## 2017-02-23 ENCOUNTER — Encounter (HOSPITAL_BASED_OUTPATIENT_CLINIC_OR_DEPARTMENT_OTHER)
Admission: RE | Admit: 2017-02-23 | Discharge: 2017-02-23 | Disposition: A | Discharge status: Home / Self Care | Payer: 59 | Source: Ambulatory Visit | Attending: Psychiatry | Admitting: Psychiatry

## 2017-02-23 DIAGNOSIS — F332 Major depressive disorder, recurrent severe without psychotic features: Secondary | ICD-10-CM

## 2017-02-23 DIAGNOSIS — F338 Other recurrent depressive disorders: Secondary | ICD-10-CM | POA: Diagnosis not present

## 2017-02-23 DIAGNOSIS — R413 Other amnesia: Secondary | ICD-10-CM | POA: Diagnosis not present

## 2017-02-23 MED ORDER — METHOHEXITAL SODIUM 100 MG/10ML IV SOSY
PREFILLED_SYRINGE | INTRAVENOUS | Status: AC
  Filled 2017-02-23: qty 100

## 2017-02-23 MED ORDER — SUCCINYLCHOLINE CHLORIDE 20 MG/ML IJ SOLN
INTRAMUSCULAR | Status: AC
  Filled 2017-02-23: qty 1

## 2017-02-23 MED ORDER — ONDANSETRON HCL 4 MG/2ML IJ SOLN
INTRAMUSCULAR | Status: AC
  Administered 2017-02-23: 4 mg via INTRAVENOUS
  Filled 2017-02-23: qty 2

## 2017-02-23 MED ORDER — ONDANSETRON HCL 4 MG/2ML IJ SOLN
4.0000 mg | Freq: Once | INTRAMUSCULAR | Status: AC
  Administered 2017-02-23: 4 mg via INTRAVENOUS

## 2017-02-23 MED ORDER — METHOHEXITAL SODIUM 100 MG/10ML IV SOSY
PREFILLED_SYRINGE | INTRAVENOUS | Status: DC | PRN
  Administered 2017-02-23: 90 mg via INTRAVENOUS

## 2017-02-23 MED ORDER — KETOROLAC TROMETHAMINE 30 MG/ML IJ SOLN
INTRAMUSCULAR | Status: AC
  Filled 2017-02-23: qty 1

## 2017-02-23 MED ORDER — MIDAZOLAM HCL 2 MG/2ML IJ SOLN
INTRAMUSCULAR | Status: AC
  Filled 2017-02-23: qty 2

## 2017-02-23 MED ORDER — KETOROLAC TROMETHAMINE 30 MG/ML IJ SOLN
30.0000 mg | Freq: Once | INTRAMUSCULAR | Status: AC
  Administered 2017-02-23: 30 mg via INTRAVENOUS

## 2017-02-23 MED ORDER — SUCCINYLCHOLINE CHLORIDE 200 MG/10ML IV SOSY
PREFILLED_SYRINGE | INTRAVENOUS | Status: DC | PRN
  Administered 2017-02-23: 110 mg via INTRAVENOUS

## 2017-02-23 MED ORDER — SODIUM CHLORIDE 0.9 % IV SOLN
500.0000 mL | Freq: Once | INTRAVENOUS | Status: AC
  Administered 2017-02-23: 1000 mL via INTRAVENOUS

## 2017-02-23 MED ORDER — MIDAZOLAM HCL 2 MG/2ML IJ SOLN
INTRAMUSCULAR | Status: DC | PRN
  Administered 2017-02-23: 2 mg via INTRAVENOUS

## 2017-02-23 NOTE — Anesthesia Post-op Follow-up Note (Cosign Needed)
Anesthesia QCDR form completed.        

## 2017-02-23 NOTE — Anesthesia Procedure Notes (Signed)
Date/Time: 02/23/2017 10:51 AM Performed by: Dionne Bucy Pre-anesthesia Checklist: Patient identified, Emergency Drugs available, Suction available and Patient being monitored Patient Re-evaluated:Patient Re-evaluated prior to inductionOxygen Delivery Method: Circle system utilized Preoxygenation: Pre-oxygenation with 100% oxygen Intubation Type: IV induction Ventilation: Mask ventilation without difficulty and Mask ventilation throughout procedure Airway Equipment and Method: Bite block Placement Confirmation: positive ETCO2 Dental Injury: Teeth and Oropharynx as per pre-operative assessment

## 2017-02-23 NOTE — Anesthesia Preprocedure Evaluation (Signed)
Anesthesia Evaluation  Patient identified by MRN, date of birth, ID band Patient awake    Reviewed: Allergy & Precautions, NPO status , Patient's Chart, lab work & pertinent test results, reviewed documented beta blocker date and time   History of Anesthesia Complications (+) PONV and history of anesthetic complications  Airway Mallampati: III  TM Distance: >3 FB     Dental  (+) Chipped   Pulmonary asthma , neg COPD, neg recent URI, Current Smoker,    breath sounds clear to auscultation- rhonchi (-) wheezing      Cardiovascular Exercise Tolerance: Good (-) hypertension+ Peripheral Vascular Disease  (-) CAD and (-) Past MI  Rhythm:Regular Rate:Normal - Systolic murmurs and - Diastolic murmurs    Neuro/Psych  Headaches, PSYCHIATRIC DISORDERS  Neuromuscular disease    GI/Hepatic hiatal hernia, GERD  Controlled,  Endo/Other  negative endocrine ROSneg diabetes  Renal/GU Renal disease (hx of nephrolithiasis)     Musculoskeletal  (+) Arthritis , Fibromyalgia -  Abdominal (+) + obese,   Peds  Hematology  (+) anemia ,   Anesthesia Other Findings Past Medical History: No date: Asthma No date: Bipolar 1 disorder ([facility]) No date: Fibromyalgia No date: GAD (generalized anxiety disorder) No date: GERD (gastroesophageal reflux disease) No date: Hiatal hernia No date: History of kidney stones No date: History of primary hyperparathyroidism     Comment: s/p  bilateral inferior parathyroidectomy               08-03-2010 No date: IBS (irritable bowel syndrome) No date: Left ureteral stone No date: Migraine No date: PONV (postoperative nausea and vomiting)     Comment: and claustrophobic with mask No date: RA (rheumatoid arthritis) (HCC)     Comment: dr Gavin Pound-- rheumtologist No date: Self mutilating behavior No date: Urgency of urination No date: Wears glasses   Reproductive/Obstetrics                              Anesthesia Physical  Anesthesia Plan  ASA: III  Anesthesia Plan: General   Post-op Pain Management:    Induction: Intravenous  Airway Management Planned: Mask  Additional Equipment:   Intra-op Plan:   Post-operative Plan:   Informed Consent: I have reviewed the patients History and Physical, chart, labs and discussed the procedure including the risks, benefits and alternatives for the proposed anesthesia with the patient or authorized representative who has indicated his/her understanding and acceptance.   Dental advisory given  Plan Discussed with: CRNA and Anesthesiologist  Anesthesia Plan Comments:         Anesthesia Quick Evaluation

## 2017-02-23 NOTE — Discharge Instructions (Signed)
1)  The drugs that you have been given will stay in your system until tomorrow so for the       next 24 hours you should not:  A. Drive an automobile  B. Make any legal decisions  C. Drink any alcoholic beverages  2)  You may resume your regular meals upon return home.  3)  A responsible adult must take you home.  Someone should stay with you for a few          hours, then be available by phone for the remainder of the treatment day.  4)  You May experience any of the following symptoms:  Headache, Nausea and a dry mouth (due to the medications you were given),  temporary memory loss and some confusion, or sore muscles (a warm bath  should help this).  If you you experience any of these symptoms let us know on                your return visit.  5)  Report any of the following: any acute discomfort, severe headache, or temperature        greater than 100.5 F.   Also report any unusual redness, swelling, drainage, or pain         at your IV site.    You may report Symptoms to:  [place] [facility]          Phone: [phone], ECT Department           or Dr. Prescott Gum office [phone]  6)  Your next ECT Treatment is Day   Wednesday  Date   Mar 02, 2017  We will call 2 days prior to your scheduled appointment for arrival times.  7)  Nothing to eat or drink after midnight the night before your procedure.  8)  Take meds     With a sip of water the morning of your procedure.  9)  Other Instructions: Call [phone] to cancel the morning of your procedure due         to illness or emergency.  10) We will call within 72 hours to assess how you are feeling.

## 2017-02-23 NOTE — Transfer of Care (Signed)
Immediate Anesthesia Transfer of Care Note  Patient: Jordan Russell  Procedure(s) Performed: ECT  Patient Location: PACU  Anesthesia Type:General  Level of Consciousness: sedated  Airway & Oxygen Therapy: Patient Spontanous Breathing and Patient connected to face mask oxygen  Post-op Assessment: Report given to RN and Post -op Vital signs reviewed and stable  Post vital signs: Reviewed and stable  Last Vitals:  Vitals:   02/23/17 0938 02/23/17 1105  BP: 120/74 125/83  Pulse: 72 84  Resp: 16 18  Temp: 36.5 C 36.4 C    Last Pain:  Vitals:   02/23/17 1105  TempSrc: Tympanic  PainSc:          Complications: No apparent anesthesia complications

## 2017-02-23 NOTE — H&P (Signed)
Jordan Russell is an 46 y.o. female.   Chief Complaint: Patient continues to have some degree of anxiety and depression. Also she excoriated her left arm in the last couple days. HPI: History of severe recurrent depression. She says that her mood is feeling better.  Past Medical History:  Diagnosis Date  . Asthma   . Bipolar 1 disorder ([place])   . Fibromyalgia   . GAD (generalized anxiety disorder)   . GERD (gastroesophageal reflux disease)   . Hiatal hernia   . History of kidney stones   . History of primary hyperparathyroidism    s/p  bilateral inferior parathyroidectomy 08-03-2010  . IBS (irritable bowel syndrome)   . Left ureteral stone   . Migraine   . PONV (postoperative nausea and vomiting)    and claustrophobic with mask  . RA (rheumatoid arthritis) ([place])    dr Gavin Pound-- rheumtologist  . Self mutilating behavior   . Urgency of urination   . Wears glasses     Past Surgical History:  Procedure Laterality Date  . ABDOMINAL HYSTERECTOMY    . BALLOON DILATION N/A 10/01/2014   Procedure: BALLOON DILATION;  Surgeon: Garlan Fair, MD;  Location: [facility];  Service: Endoscopy;  Laterality: N/A;  . CYSTO/  BILATERAL RETROGRADE PYELOGRAM  11/20/2000  . CYSTO/  RIGHT RETROGRADE PYELOGRAM/  RIGHT URETEROSCOPY/  STENT PLACEMENT  12/16/2006  . CYSTOSCOPY/URETEROSCOPY/HOLMIUM LASER/STENT PLACEMENT Left 11/09/2016   Procedure: CYSTOSCOPY/URETEROSCOPY/HOLMIUM LASER/STENT PLACEMENT;  Surgeon: Nickie Retort, MD;  Location: [facility];  Service: Urology;  Laterality: Left;  90 MINS  [phone]   . ESOPHAGEAL MANOMETRY N/A 12/23/2014   Procedure: ESOPHAGEAL MANOMETRY (EM);  Surgeon: Garlan Fair, MD;  Location: [facility];  Service: Endoscopy;  Laterality: N/A;  . ESOPHAGOGASTRODUODENOSCOPY  last one 03-28-2015  . ESOPHAGOGASTRODUODENOSCOPY (EGD) WITH PROPOFOL N/A 10/01/2014   Procedure: ESOPHAGOGASTRODUODENOSCOPY (EGD) WITH PROPOFOL;  Surgeon:  Garlan Fair, MD;  Location: [facility];  Service: Endoscopy;  Laterality: N/A;  . EXTRACORPOREAL SHOCK WAVE LITHOTRIPSY  multiple times since age 61  . HOLMIUM LASER APPLICATION Left 06/03/6628   Procedure: HOLMIUM LASER APPLICATION;  Surgeon: Nickie Retort, MD;  Location: [facility];  Service: Urology;  Laterality: Left;  . KNEE ARTHROSCOPY Right 03-04-2014  [facility]   w/ Arthrotomy  . LAPAROSCOPIC ASSISTED VAGINAL HYSTERECTOMY  12/28/2005  . LAPAROSCOPIC CHOLECYSTECTOMY  01/17/2004  . LAPAROSCOPY LEFT OVARIAN CYSTECTOMY/  BILATERAL TUBAL LIGATION  02/26/2003  . LEFT URETEROSCOPIC STONE EXTRACTION /  STENT PLACEMENT  07/09/2002  . NECK EXPLORATION/  BILATERAL INFERIOR PARATHYROIDECTOMY  08/03/2010  . RIGHT URETERAL DILATION/  URETEROSCOPIC STONE EXTRACTION  06/12/2010  . ROUX-EN-Y GASTRIC BYPASS  2001  . SOLYX TRANSURETHRAL SLING/  POSTERIOR PELVIC FLOOR SACROSPINOUS REPAIR  02/21/2009   and Cysto/  Bilateral ureteral stent placement  . TONSILLECTOMY AND ADENOIDECTOMY    . WRIST GANGLION EXCISION Right 01/28/2000    Family History  Problem Relation Age of Onset  . Bipolar disorder Son   . Anxiety disorder Brother   . Depression Brother    Social History:  reports that she has been smoking Cigarettes.  She has a 5.00 pack-year smoking history. She has never used smokeless tobacco. She reports that she does not drink alcohol or use drugs.  Allergies:  Allergies  Allergen Reactions  . Lamictal [Lamotrigine] Nausea And Vomiting and Other (See Comments)    Tremors, diplopia  . Penicillins Hives and Itching    As a child, "breathing,  itching problem" Has patient had a PCN reaction causing immediate rash, facial/tongue/throat swelling, SOB or lightheadedness with hypotension: Yes Has patient had a PCN reaction causing severe rash involving mucus membranes or skin necrosis: Yes Has patient had a PCN reaction that required hospitalization No Has patient had a PCN  reaction occurring within the last 10 years: No If all of the above answers are "NO", then may proceed with Cephalosporin use.   . Sulfa Antibiotics Hives  . Morphine And Related Itching and Rash     (Not in a hospital admission)  No results found for this or any previous visit (from the past 48 hour(s)). No results found.  Review of Systems  Constitutional: Negative.   HENT: Negative.   Eyes: Negative.   Respiratory: Negative.   Cardiovascular: Negative.   Gastrointestinal: Negative.   Musculoskeletal: Negative.   Skin: Negative.   Neurological: Negative.   Psychiatric/Behavioral: Positive for memory loss. Negative for depression, hallucinations, substance abuse and suicidal ideas. The patient is nervous/anxious. The patient does not have insomnia.     Blood pressure 120/74, pulse 72, temperature 97.7 F (36.5 C), temperature source Oral, resp. rate 16, height 5\' 7"  (1.702 m), weight 100.2 kg (221 lb), SpO2 100 %. Physical Exam  Nursing note and vitals reviewed. Constitutional: She appears well-developed and well-nourished.  HENT:  Head: Normocephalic and atraumatic.  Eyes: Conjunctivae are normal. Pupils are equal, round, and reactive to light.  Neck: Normal range of motion.  Cardiovascular: Normal heart sounds.   Respiratory: Effort normal.  GI: Soft.  Musculoskeletal: Normal range of motion.  Neurological: She is alert.  Skin: Skin is warm and dry.     Psychiatric: Her speech is normal and behavior is normal. Judgment and thought content normal. Her mood appears anxious. Cognition and memory are impaired.     Assessment/Plan Plan for continued maintenance treatment once a week with an attempt and to try and start stretching this out if possible. Even though she did injure herself on her forearm she insists that overall she feels her mood is better.  [name], MD 02/23/2017, 10:43 AM

## 2017-02-23 NOTE — Anesthesia Postprocedure Evaluation (Signed)
Anesthesia Post Note  Patient: Jordan Russell  Procedure(s) Performed: * No procedures listed *  Patient location during evaluation: PACU Anesthesia Type: General Level of consciousness: awake and alert Pain management: pain level controlled Vital Signs Assessment: post-procedure vital signs reviewed and stable Respiratory status: spontaneous breathing, nonlabored ventilation, respiratory function stable and patient connected to nasal cannula oxygen Cardiovascular status: blood pressure returned to baseline and stable Postop Assessment: no signs of nausea or vomiting Anesthetic complications: no     Last Vitals:  Vitals:   02/23/17 1126 02/23/17 1137  BP:  (!) 115/98  Pulse:  78  Resp:  18  Temp: 36.2 C     Last Pain:  Vitals:   02/23/17 1126  TempSrc: Tympanic  PainSc: 0-No pain                 Martha Clan

## 2017-02-23 NOTE — Procedures (Signed)
ECT SERVICES Physician's Interval Evaluation & Treatment Note  Patient Identification: Jordan Russell MRN:  [id number] Date of Evaluation:  02/23/2017 TX #: 13  MADRS: 29  MMSE: 28  P.E. Findings:  Patient has new excoriations on her left arm mostly forearm. Otherwise physical exam is the same.  Psychiatric Interval Note:  Insists that her mood is generally improved although she admits she still gets irritable and angry and has self injuring impulses when frustrated  Subjective:  Patient is a 46 y.o. female seen for evaluation for Electroconvulsive Therapy. No other specific complaint.  Treatment Summary:   [x]   Right Unilateral             []  Bilateral   % Energy : 0.100millisecond 100%   Impedance: 2120 ohms  Seizure Energy Index: 29,113 V squared  Postictal Suppression Index: 71%  Seizure Concordance Index: 90%  Medications  Pre Shock: Zofran 4 mg Toradol 30 mg Brevital 90 mg succinylcholine 100 mg  Post Shock: Versed 2 mg  Seizure Duration: 15 seconds by EMG 33 seconds by EEG   Comments: Follow-up one week   Lungs:  [x]   Clear to auscultation               []  Other:   Heart:    [x]   Regular rhythm             []  irregular rhythm    [x]   Previous H&P reviewed, patient examined and there are NO CHANGES                 []   Previous H&P reviewed, patient examined and there are changes noted.   [name], MD 4/25/201810:46 AM

## 2017-03-01 ENCOUNTER — Other Ambulatory Visit: Payer: Self-pay | Admitting: Psychiatry

## 2017-03-02 ENCOUNTER — Encounter: Payer: Self-pay | Admitting: Anesthesiology

## 2017-03-02 ENCOUNTER — Encounter
Admission: RE | Admit: 2017-03-02 | Discharge: 2017-03-02 | Disposition: A | Discharge status: Home / Self Care | Payer: 59 | Source: Ambulatory Visit | Attending: Psychiatry | Admitting: Psychiatry

## 2017-03-02 DIAGNOSIS — J45909 Unspecified asthma, uncomplicated: Secondary | ICD-10-CM | POA: Diagnosis not present

## 2017-03-02 DIAGNOSIS — M797 Fibromyalgia: Secondary | ICD-10-CM | POA: Insufficient documentation

## 2017-03-02 DIAGNOSIS — F332 Major depressive disorder, recurrent severe without psychotic features: Secondary | ICD-10-CM | POA: Diagnosis not present

## 2017-03-02 DIAGNOSIS — K219 Gastro-esophageal reflux disease without esophagitis: Secondary | ICD-10-CM | POA: Diagnosis not present

## 2017-03-02 DIAGNOSIS — Z880 Allergy status to penicillin: Secondary | ICD-10-CM | POA: Insufficient documentation

## 2017-03-02 DIAGNOSIS — E892 Postprocedural hypoparathyroidism: Secondary | ICD-10-CM | POA: Diagnosis not present

## 2017-03-02 DIAGNOSIS — Z87442 Personal history of urinary calculi: Secondary | ICD-10-CM | POA: Diagnosis not present

## 2017-03-02 DIAGNOSIS — Z818 Family history of other mental and behavioral disorders: Secondary | ICD-10-CM | POA: Diagnosis not present

## 2017-03-02 DIAGNOSIS — R413 Other amnesia: Secondary | ICD-10-CM | POA: Insufficient documentation

## 2017-03-02 DIAGNOSIS — F17210 Nicotine dependence, cigarettes, uncomplicated: Secondary | ICD-10-CM | POA: Insufficient documentation

## 2017-03-02 DIAGNOSIS — M069 Rheumatoid arthritis, unspecified: Secondary | ICD-10-CM | POA: Insufficient documentation

## 2017-03-02 DIAGNOSIS — Z885 Allergy status to narcotic agent status: Secondary | ICD-10-CM | POA: Insufficient documentation

## 2017-03-02 DIAGNOSIS — K589 Irritable bowel syndrome without diarrhea: Secondary | ICD-10-CM | POA: Insufficient documentation

## 2017-03-02 DIAGNOSIS — Z888 Allergy status to other drugs, medicaments and biological substances status: Secondary | ICD-10-CM | POA: Diagnosis not present

## 2017-03-02 DIAGNOSIS — Z882 Allergy status to sulfonamides status: Secondary | ICD-10-CM | POA: Diagnosis not present

## 2017-03-02 DIAGNOSIS — F411 Generalized anxiety disorder: Secondary | ICD-10-CM | POA: Insufficient documentation

## 2017-03-02 MED ORDER — FENTANYL CITRATE (PF) 100 MCG/2ML IJ SOLN: 25.0000 ug | INTRAMUSCULAR | Status: DC | PRN

## 2017-03-02 MED ORDER — ONDANSETRON HCL 4 MG/2ML IJ SOLN
4.0000 mg | Freq: Once | INTRAMUSCULAR | Status: AC
  Administered 2017-03-02: 4 mg via INTRAVENOUS

## 2017-03-02 MED ORDER — ONDANSETRON HCL 4 MG/2ML IJ SOLN
INTRAMUSCULAR | Status: AC
  Administered 2017-03-02: 4 mg via INTRAVENOUS
  Filled 2017-03-02: qty 2

## 2017-03-02 MED ORDER — KETOROLAC TROMETHAMINE 30 MG/ML IJ SOLN
30.0000 mg | Freq: Once | INTRAMUSCULAR | Status: AC
  Administered 2017-03-02: 30 mg via INTRAVENOUS

## 2017-03-02 MED ORDER — SODIUM CHLORIDE 0.9 % IV SOLN
500.0000 mL | Freq: Once | INTRAVENOUS | Status: AC
  Administered 2017-03-02: 10:00:00 via INTRAVENOUS

## 2017-03-02 MED ORDER — METHOHEXITAL SODIUM 100 MG/10ML IV SOSY
PREFILLED_SYRINGE | INTRAVENOUS | Status: DC | PRN
  Administered 2017-03-02: 90 mg via INTRAVENOUS

## 2017-03-02 MED ORDER — SUCCINYLCHOLINE CHLORIDE 20 MG/ML IJ SOLN
INTRAMUSCULAR | Status: AC
  Filled 2017-03-02: qty 1

## 2017-03-02 MED ORDER — KETOROLAC TROMETHAMINE 30 MG/ML IJ SOLN
INTRAMUSCULAR | Status: AC
  Filled 2017-03-02: qty 1

## 2017-03-02 MED ORDER — SODIUM CHLORIDE 0.9 % IV SOLN
INTRAVENOUS | Status: DC | PRN
  Administered 2017-03-02: 11:00:00 via INTRAVENOUS

## 2017-03-02 MED ORDER — MIDAZOLAM HCL 2 MG/2ML IJ SOLN
2.0000 mg | Freq: Once | INTRAMUSCULAR | Status: AC
  Administered 2017-03-02: 2 mg via INTRAVENOUS

## 2017-03-02 MED ORDER — MIDAZOLAM HCL 2 MG/2ML IJ SOLN
INTRAMUSCULAR | Status: AC
  Filled 2017-03-02: qty 2

## 2017-03-02 MED ORDER — ONDANSETRON HCL 4 MG/2ML IJ SOLN: 4.0000 mg | Freq: Once | INTRAMUSCULAR | Status: DC | PRN

## 2017-03-02 MED ORDER — SUCCINYLCHOLINE CHLORIDE 200 MG/10ML IV SOSY
PREFILLED_SYRINGE | INTRAVENOUS | Status: DC | PRN
  Administered 2017-03-02: 110 mg via INTRAVENOUS

## 2017-03-02 NOTE — Anesthesia Procedure Notes (Signed)
Date/Time: 03/02/2017 11:20 AM Performed by: Dionne Bucy Pre-anesthesia Checklist: Patient identified, Emergency Drugs available, Suction available and Patient being monitored Patient Re-evaluated:Patient Re-evaluated prior to inductionOxygen Delivery Method: Circle system utilized Preoxygenation: Pre-oxygenation with 100% oxygen Intubation Type: IV induction Ventilation: Mask ventilation without difficulty and Mask ventilation throughout procedure Airway Equipment and Method: Bite block Placement Confirmation: positive ETCO2 Dental Injury: Teeth and Oropharynx as per pre-operative assessment

## 2017-03-02 NOTE — Anesthesia Post-op Follow-up Note (Cosign Needed)
Anesthesia QCDR form completed.        

## 2017-03-02 NOTE — Anesthesia Preprocedure Evaluation (Signed)
Anesthesia Evaluation  Patient identified by MRN, date of birth, ID band Patient awake    Reviewed: Allergy & Precautions, NPO status , Patient's Chart, lab work & pertinent test results, reviewed documented beta blocker date and time   History of Anesthesia Complications (+) PONV and history of anesthetic complications  Airway Mallampati: III  TM Distance: >3 FB     Dental  (+) Chipped   Pulmonary asthma , neg COPD, neg recent URI, Current Smoker,    breath sounds clear to auscultation- rhonchi (-) wheezing      Cardiovascular Exercise Tolerance: Good (-) hypertension+ Peripheral Vascular Disease  (-) CAD and (-) Past MI  Rhythm:Regular Rate:Normal - Systolic murmurs and - Diastolic murmurs    Neuro/Psych  Headaches, PSYCHIATRIC DISORDERS Anxiety Bipolar Disorder  Neuromuscular disease    GI/Hepatic hiatal hernia, GERD  Controlled,  Endo/Other  negative endocrine ROSneg diabetes  Renal/GU Renal disease (hx of nephrolithiasis)     Musculoskeletal  (+) Arthritis , Fibromyalgia -  Abdominal (+) + obese,   Peds  Hematology  (+) anemia ,   Anesthesia Other Findings Past Medical History: No date: Asthma No date: Bipolar 1 disorder (HCC) No date: Fibromyalgia No date: GAD (generalized anxiety disorder) No date: GERD (gastroesophageal reflux disease) No date: Hiatal hernia No date: History of kidney stones No date: History of primary hyperparathyroidism     Comment: s/p  bilateral inferior parathyroidectomy               08-03-2010 No date: IBS (irritable bowel syndrome) No date: Left ureteral stone No date: Migraine No date: PONV (postoperative nausea and vomiting)     Comment: and claustrophobic with mask No date: RA (rheumatoid arthritis) (HCC)     Comment: dr Gavin Pound-- rheumtologist No date: Self mutilating behavior No date: Urgency of urination No date: Wears glasses   Reproductive/Obstetrics                              Anesthesia Physical  Anesthesia Plan  ASA: III  Anesthesia Plan: General   Post-op Pain Management:    Induction: Intravenous  Airway Management Planned: Mask  Additional Equipment:   Intra-op Plan:   Post-operative Plan:   Informed Consent: I have reviewed the patients History and Physical, chart, labs and discussed the procedure including the risks, benefits and alternatives for the proposed anesthesia with the patient or authorized representative who has indicated his/her understanding and acceptance.   Dental advisory given  Plan Discussed with: CRNA and Anesthesiologist  Anesthesia Plan Comments:         Anesthesia Quick Evaluation

## 2017-03-02 NOTE — Procedures (Signed)
ECT SERVICES Physician's Interval Evaluation & Treatment Note  Patient Identification: Jordan Russell MRN:  [id number] Date of Evaluation:  03/02/2017 TX #: 14  MADRS:   MMSE:   P.E. Findings:  No change to physical exam no new self-mutilation  Psychiatric Interval Note:  Mood is anxious not suicidal feeling confused.  Subjective:  Patient is a 46 y.o. female seen for evaluation for Electroconvulsive Therapy. Not necessarily more depressed but feeling more anxious I think  Treatment Summary:   [x]   Right Unilateral             []  Bilateral   % Energy : 0.3 ms 100%   Impedance: 2410 ohms  Seizure Energy Index: 8183 V squared  Postictal Suppression Index: 92%  Seizure Concordance Index: 72%  Medications  Pre Shock: Zofran 4 mg Toradol 30 mg Brevital 90 mg succinylcholine 100 mg  Post Shock: Versed 2 mg  Seizure Duration: 12 seconds by EMG 17 seconds by EEG   Comments: Short seizure. Patient will be coming back in 2 weeks. We will review her outpatient medications as well and she is encouraged to go follow-up with her outpatient psychiatric provider. Patient and her mother agree to the plan.   Lungs:  [x]   Clear to auscultation               []  Other:   Heart:    [x]   Regular rhythm             []  irregular rhythm    [x]   Previous H&P reviewed, patient examined and there are NO CHANGES                 []   Previous H&P reviewed, patient examined and there are changes noted.   [name], MD 5/2/201811:25 AM

## 2017-03-02 NOTE — H&P (Addendum)
Jordan Russell is an 46 y.o. female.   Chief Complaint: Patient having memory problems and confusion and some are difficult to explain symptoms such as olfactory hallucinations. Mood overall better no cutting of herself but worries about being on functional. HPI: Recurrent depression and having improvement with treatment but also having side effects and some confusion. Unclear if we are better off with more or less treatment although at this point I think it's probably better to give her a longer time of recovery  Past Medical History:  Diagnosis Date  . Asthma   . Bipolar 1 disorder ([place])   . Fibromyalgia   . GAD (generalized anxiety disorder)   . GERD (gastroesophageal reflux disease)   . Hiatal hernia   . History of kidney stones   . History of primary hyperparathyroidism    s/p  bilateral inferior parathyroidectomy 08-03-2010  . IBS (irritable bowel syndrome)   . Left ureteral stone   . Migraine   . PONV (postoperative nausea and vomiting)    and claustrophobic with mask  . RA (rheumatoid arthritis) ([place])    dr Gavin Pound-- rheumtologist  . Self mutilating behavior   . Urgency of urination   . Wears glasses     Past Surgical History:  Procedure Laterality Date  . ABDOMINAL HYSTERECTOMY    . BALLOON DILATION N/A 10/01/2014   Procedure: BALLOON DILATION;  Surgeon: Garlan Fair, MD;  Location: [facility];  Service: Endoscopy;  Laterality: N/A;  . CYSTO/  BILATERAL RETROGRADE PYELOGRAM  11/20/2000  . CYSTO/  RIGHT RETROGRADE PYELOGRAM/  RIGHT URETEROSCOPY/  STENT PLACEMENT  12/16/2006  . CYSTOSCOPY/URETEROSCOPY/HOLMIUM LASER/STENT PLACEMENT Left 11/09/2016   Procedure: CYSTOSCOPY/URETEROSCOPY/HOLMIUM LASER/STENT PLACEMENT;  Surgeon: Nickie Retort, MD;  Location: [facility];  Service: Urology;  Laterality: Left;  90 MINS  [phone]   . ESOPHAGEAL MANOMETRY N/A 12/23/2014   Procedure: ESOPHAGEAL MANOMETRY (EM);  Surgeon: Garlan Fair, MD;   Location: [facility];  Service: Endoscopy;  Laterality: N/A;  . ESOPHAGOGASTRODUODENOSCOPY  last one 03-28-2015  . ESOPHAGOGASTRODUODENOSCOPY (EGD) WITH PROPOFOL N/A 10/01/2014   Procedure: ESOPHAGOGASTRODUODENOSCOPY (EGD) WITH PROPOFOL;  Surgeon: Garlan Fair, MD;  Location: [facility];  Service: Endoscopy;  Laterality: N/A;  . EXTRACORPOREAL SHOCK WAVE LITHOTRIPSY  multiple times since age 72  . HOLMIUM LASER APPLICATION Left 0/07/8118   Procedure: HOLMIUM LASER APPLICATION;  Surgeon: Nickie Retort, MD;  Location: [facility];  Service: Urology;  Laterality: Left;  . KNEE ARTHROSCOPY Right 03-04-2014  [facility]   w/ Arthrotomy  . LAPAROSCOPIC ASSISTED VAGINAL HYSTERECTOMY  12/28/2005  . LAPAROSCOPIC CHOLECYSTECTOMY  01/17/2004  . LAPAROSCOPY LEFT OVARIAN CYSTECTOMY/  BILATERAL TUBAL LIGATION  02/26/2003  . LEFT URETEROSCOPIC STONE EXTRACTION /  STENT PLACEMENT  07/09/2002  . NECK EXPLORATION/  BILATERAL INFERIOR PARATHYROIDECTOMY  08/03/2010  . RIGHT URETERAL DILATION/  URETEROSCOPIC STONE EXTRACTION  06/12/2010  . ROUX-EN-Y GASTRIC BYPASS  2001  . SOLYX TRANSURETHRAL SLING/  POSTERIOR PELVIC FLOOR SACROSPINOUS REPAIR  02/21/2009   and Cysto/  Bilateral ureteral stent placement  . TONSILLECTOMY AND ADENOIDECTOMY    . WRIST GANGLION EXCISION Right 01/28/2000    Family History  Problem Relation Age of Onset  . Bipolar disorder Son   . Anxiety disorder Brother   . Depression Brother    Social History:  reports that she has been smoking Cigarettes.  She has a 5.00 pack-year smoking history. She has never used smokeless tobacco. She reports that she does not drink alcohol  or use drugs.  Allergies:  Allergies  Allergen Reactions  . Lamictal [Lamotrigine] Nausea And Vomiting and Other (See Comments)    Tremors, diplopia  . Penicillins Hives and Itching    As a child, "breathing, itching problem" Has patient had a PCN reaction causing immediate rash,  facial/tongue/throat swelling, SOB or lightheadedness with hypotension: Yes Has patient had a PCN reaction causing severe rash involving mucus membranes or skin necrosis: Yes Has patient had a PCN reaction that required hospitalization No Has patient had a PCN reaction occurring within the last 10 years: No If all of the above answers are "NO", then may proceed with Cephalosporin use.   . Sulfa Antibiotics Hives  . Morphine And Related Itching and Rash     (Not in a hospital admission)  No results found for this or any previous visit (from the past 48 hour(s)). No results found.  Review of Systems  Constitutional: Negative.   HENT: Negative.   Eyes: Negative.   Respiratory: Negative.   Cardiovascular: Negative.   Gastrointestinal: Negative.   Musculoskeletal: Negative.   Skin: Negative.   Neurological: Negative.   Psychiatric/Behavioral: Positive for depression, hallucinations and memory loss. Negative for substance abuse and suicidal ideas. The patient is nervous/anxious and has insomnia.     Blood pressure 113/72, pulse 75, temperature 97.7 F (36.5 C), temperature source Oral, resp. rate 19, height 5\' 7"  (1.702 m), weight 100.7 kg (222 lb), SpO2 98 %. Physical Exam  Nursing note and vitals reviewed. Constitutional: She appears well-developed and well-nourished.  HENT:  Head: Normocephalic and atraumatic.  Eyes: Conjunctivae are normal. Pupils are equal, round, and reactive to light.  Neck: Normal range of motion.  Cardiovascular: Normal rate, regular rhythm and normal heart sounds.   Respiratory: Effort normal and breath sounds normal. No respiratory distress.  GI: Soft.  Musculoskeletal: Normal range of motion.  Neurological: She is alert.  Skin: Skin is warm and dry.  Psychiatric: Her speech is normal and behavior is normal. Judgment normal. Her mood appears anxious. She expresses no homicidal and no suicidal ideation. She exhibits abnormal recent memory.      Assessment/Plan Treatment today and then were going to see her back in 2 weeks. Patient agrees to plan  [name], MD 03/02/2017, 11:18 AM

## 2017-03-02 NOTE — Anesthesia Postprocedure Evaluation (Signed)
Anesthesia Post Note  Patient: Jordan Russell  Procedure(s) Performed: * No procedures listed *  Patient location during evaluation: PACU Anesthesia Type: General Level of consciousness: awake and alert and oriented Pain management: pain level controlled Vital Signs Assessment: post-procedure vital signs reviewed and stable Respiratory status: spontaneous breathing Cardiovascular status: blood pressure returned to baseline Anesthetic complications: no     Last Vitals:  Vitals:   03/02/17 1133 03/02/17 1145  BP: 117/70 125/85  Pulse: 72 69  Resp: 17 16  Temp: 36.9 C     Last Pain:  Vitals:   03/02/17 0916  TempSrc: Oral                 Danuta Huseman

## 2017-03-02 NOTE — Discharge Instructions (Signed)
1)  The drugs that you have been given will stay in your system until tomorrow so for the       next 24 hours you should not:  A. Drive an automobile  B. Make any legal decisions  C. Drink any alcoholic beverages  2)  You may resume your regular meals upon return home.  3)  A responsible adult must take you home.  Someone should stay with you for a few          hours, then be available by phone for the remainder of the treatment day.  4)  You May experience any of the following symptoms:  Headache, Nausea and a dry mouth (due to the medications you were given),  temporary memory loss and some confusion, or sore muscles (a warm bath  should help this).  If you you experience any of these symptoms let us know on                your return visit.  5)  Report any of the following: any acute discomfort, severe headache, or temperature        greater than 100.5 F.   Also report any unusual redness, swelling, drainage, or pain         at your IV site.    You may report Symptoms to:  [place] [facility]          Phone: [phone], ECT Department           or Dr. Prescott Gum office [phone]  6)  Your next ECT Treatment is Monday May 14,2018  We will call 2 days prior to your scheduled appointment for arrival times.  7)  Nothing to eat or drink after midnight the night before your procedure.  8)  Take omeprazole   With a sip of water the morning of your procedure.  9)  Other Instructions: Call [phone] to cancel the morning of your procedure due         to illness or emergency.  10) We will call within 72 hours to assess how you are feeling.

## 2017-03-02 NOTE — Transfer of Care (Signed)
Immediate Anesthesia Transfer of Care Note  Patient: Jordan Russell  Procedure(s) Performed: ECT  Patient Location: PACU  Anesthesia Type:General  Level of Consciousness: sedated  Airway & Oxygen Therapy: Patient Spontanous Breathing  Post-op Assessment: Report given to RN and Post -op Vital signs reviewed and stable  Post vital signs: Reviewed and stable  Last Vitals:  Vitals:   03/02/17 0916 03/02/17 1133  BP: 113/72 117/70  Pulse: 75 72  Resp: 19 17  Temp: 36.5 C 36.9 C    Last Pain:  Vitals:   03/02/17 0916  TempSrc: Oral         Complications: No apparent anesthesia complications

## 2017-03-04 NOTE — Progress Notes (Addendum)
Patient stated that she had a headache since ECT and was slurring slightly but she did not have any slurred speech when I spoke with her. She questioned whether she should wait longer than 2 weeks to return for another treatment but I said that we would reassess her just prior to her next ECT.  Phone message left telling patient that if she continues to feel like she is slurring or any other symptoms that she should call her PCP.  Dr. Weber Cooks notified.  Patient also asked if we (staff) used restraints in the procedure room as she had some bruising that she did not know where they came from.  I reassured her that we DID NOT use any restraints or we DID NOT hold her down in any form.  Patient stated that she did not think we did but just had to ask.

## 2017-03-11 ENCOUNTER — Telehealth: Payer: Self-pay

## 2017-03-22 ENCOUNTER — Ambulatory Visit
Admission: RE | Admit: 2017-03-22 | Discharge: 2017-03-22 | Disposition: A | Discharge status: Home / Self Care | Payer: 59 | Source: Ambulatory Visit | Attending: Gastroenterology | Admitting: Gastroenterology

## 2017-03-22 ENCOUNTER — Other Ambulatory Visit: Payer: Self-pay | Admitting: Gastroenterology

## 2017-03-22 DIAGNOSIS — K5904 Chronic idiopathic constipation: Secondary | ICD-10-CM

## 2017-03-25 ENCOUNTER — Other Ambulatory Visit (HOSPITAL_COMMUNITY)
Admission: RE | Admit: 2017-03-25 | Discharge: 2017-03-25 | Disposition: A | Discharge status: Home / Self Care | Payer: 59 | Source: Ambulatory Visit | Attending: Gastroenterology | Admitting: Gastroenterology

## 2017-03-25 ENCOUNTER — Other Ambulatory Visit: Payer: Self-pay | Admitting: Gastroenterology

## 2017-03-25 DIAGNOSIS — R1310 Dysphagia, unspecified: Secondary | ICD-10-CM | POA: Diagnosis not present

## 2017-04-01 ENCOUNTER — Other Ambulatory Visit: Payer: Self-pay | Admitting: Internal Medicine

## 2017-04-01 DIAGNOSIS — R270 Ataxia, unspecified: Secondary | ICD-10-CM

## 2017-04-04 ENCOUNTER — Ambulatory Visit
Admission: RE | Admit: 2017-04-04 | Discharge: 2017-04-04 | Disposition: A | Discharge status: Home / Self Care | Payer: 59 | Source: Ambulatory Visit | Attending: Internal Medicine | Admitting: Internal Medicine

## 2017-04-04 DIAGNOSIS — R270 Ataxia, unspecified: Secondary | ICD-10-CM

## 2017-07-30 ENCOUNTER — Encounter (HOSPITAL_BASED_OUTPATIENT_CLINIC_OR_DEPARTMENT_OTHER): Payer: Self-pay | Admitting: Emergency Medicine

## 2017-07-30 ENCOUNTER — Emergency Department (HOSPITAL_BASED_OUTPATIENT_CLINIC_OR_DEPARTMENT_OTHER)
Admission: EM | Admit: 2017-07-30 | Discharge: 2017-07-30 | Disposition: A | Discharge status: Home / Self Care | Payer: 59 | Attending: Emergency Medicine | Admitting: Emergency Medicine

## 2017-07-30 DIAGNOSIS — Z79899 Other long term (current) drug therapy: Secondary | ICD-10-CM | POA: Diagnosis not present

## 2017-07-30 DIAGNOSIS — R300 Dysuria: Secondary | ICD-10-CM | POA: Insufficient documentation

## 2017-07-30 DIAGNOSIS — J45909 Unspecified asthma, uncomplicated: Secondary | ICD-10-CM | POA: Diagnosis not present

## 2017-07-30 DIAGNOSIS — F17210 Nicotine dependence, cigarettes, uncomplicated: Secondary | ICD-10-CM | POA: Insufficient documentation

## 2017-07-30 DIAGNOSIS — R339 Retention of urine, unspecified: Secondary | ICD-10-CM | POA: Diagnosis present

## 2017-07-30 LAB — URINALYSIS, ROUTINE W REFLEX MICROSCOPIC
Bilirubin Urine: NEGATIVE
Glucose, UA: NEGATIVE mg/dL
HGB URINE DIPSTICK: NEGATIVE
Ketones, ur: NEGATIVE mg/dL
NITRITE: NEGATIVE
PROTEIN: NEGATIVE mg/dL
SPECIFIC GRAVITY, URINE: 1.025 (ref 1.005–1.030)
pH: 6.5 (ref 5.0–8.0)

## 2017-07-30 LAB — BASIC METABOLIC PANEL
Anion gap: 8 (ref 5–15)
BUN: 18 mg/dL (ref 6–20)
CALCIUM: 8.8 mg/dL — AB (ref 8.9–10.3)
CHLORIDE: 108 mmol/L (ref 101–111)
CO2: 23 mmol/L (ref 22–32)
CREATININE: 0.71 mg/dL (ref 0.44–1.00)
Glucose, Bld: 101 mg/dL — ABNORMAL HIGH (ref 65–99)
Potassium: 3.7 mmol/L (ref 3.5–5.1)
SODIUM: 139 mmol/L (ref 135–145)

## 2017-07-30 LAB — CBC WITH DIFFERENTIAL/PLATELET
BASOS ABS: 0 10*3/uL (ref 0.0–0.1)
BASOS PCT: 0 %
EOS ABS: 0 10*3/uL (ref 0.0–0.7)
EOS PCT: 0 %
HCT: 38.4 % (ref 36.0–46.0)
HEMOGLOBIN: 12.6 g/dL (ref 12.0–15.0)
Lymphocytes Relative: 18 %
Lymphs Abs: 1.7 10*3/uL (ref 0.7–4.0)
MCH: 32.4 pg (ref 26.0–34.0)
MCHC: 32.8 g/dL (ref 30.0–36.0)
MCV: 98.7 fL (ref 78.0–100.0)
Monocytes Absolute: 0.6 10*3/uL (ref 0.1–1.0)
Monocytes Relative: 7 %
NEUTROS PCT: 75 %
Neutro Abs: 7.2 10*3/uL (ref 1.7–7.7)
PLATELETS: 284 10*3/uL (ref 150–400)
RBC: 3.89 MIL/uL (ref 3.87–5.11)
RDW: 13.1 % (ref 11.5–15.5)
WBC: 9.6 10*3/uL (ref 4.0–10.5)

## 2017-07-30 LAB — URINALYSIS, MICROSCOPIC (REFLEX): RBC / HPF: NONE SEEN RBC/hpf (ref 0–5)

## 2017-07-30 MED ORDER — PHENAZOPYRIDINE HCL 200 MG PO TABS: 200.0000 mg | ORAL_TABLET | Freq: Three times a day (TID) | ORAL | 0 refills | Status: DC

## 2017-07-30 MED ORDER — PHENAZOPYRIDINE HCL 100 MG PO TABS
95.0000 mg | ORAL_TABLET | Freq: Once | ORAL | Status: AC
  Administered 2017-07-30: 100 mg via ORAL
  Filled 2017-07-30: qty 1

## 2017-07-30 NOTE — ED Provider Notes (Signed)
[place] DEPT [facility] Provider Note   CSN: [id number] Arrival date & time: 07/30/17  1549     History   Chief Complaint Chief Complaint  Patient presents with  . Urinary Retention    HPI Jordan Russell is a 46 y.o. female.  HPI  Patient presents with concern of suprapubic pressure, oliguria. Symptoms of been present for possibly one month, worse over the past few days. No new fever, chills, nausea, vomiting. Patient notes multiple medical issues including bipolar disorder. Patient started to medications approximately 3 weeks ago, but symptoms preceded the use of these medications. About one month ago, about the time she noticed changes in urinary pattern she also developed bruises in both upper extremities, both lower extremities. No loss of strength or sensation anywhere. Today the patient went to urgent care, and after she described decreased urine production she was sent here for evaluation, for consideration of acute kidney failure.   Past Medical History:  Diagnosis Date  . Asthma   . Bipolar 1 disorder ([place])   . Fibromyalgia   . GAD (generalized anxiety disorder)   . GERD (gastroesophageal reflux disease)   . Hiatal hernia   . History of kidney stones   . History of primary hyperparathyroidism    s/p  bilateral inferior parathyroidectomy 08-03-2010  . IBS (irritable bowel syndrome)   . Left ureteral stone   . Migraine   . PONV (postoperative nausea and vomiting)    and claustrophobic with mask  . RA (rheumatoid arthritis) ([place])    dr Gavin Pound-- rheumtologist  . Self mutilating behavior   . Urgency of urination   . Wears glasses     Patient Active Problem List   Diagnosis Date Noted  . Asthma with acute exacerbation 12/17/2016  . Carpal tunnel syndrome of left wrist 12/17/2016  . Dysphagia, pharyngoesophageal phase 12/17/2016  . Fibromyalgia 12/17/2016  . H/O vitamin D deficiency 12/17/2016  . Hidradenitis suppurativa 12/17/2016  .  Calculus of kidney 12/17/2016  . Hypoglycemia 12/17/2016  . Hyperglycemia 12/17/2016  . Hydronephrosis with renal and ureteral calculus obstruction 12/17/2016  . Hydradenitis 12/17/2016  . Hx of iron deficiency anemia 12/17/2016  . HPTH (hyperparathyroidism) ([place]) 12/17/2016  . Nocturia 12/17/2016  . Intractable migraine with aura without status migrainosus 12/17/2016  . Pain in left knee 12/17/2016  . Arthralgia of multiple joints 12/17/2016  . Avitaminosis D 12/17/2016  . Anemia, iron deficiency 12/17/2016  . Cutaneous eruption 12/17/2016  . Pain in joint 12/17/2016  . Varicose veins of both legs with edema 06/28/2016  . Adverse effects of medication 03/10/2016  . Bipolar disorder ([place]) 03/10/2016  . Rheumatoid arthritis ([place]) 03/10/2016  . Migraine 03/10/2016  . IBS (irritable bowel syndrome) 03/10/2016  . Anxiety 03/10/2016  . Asthma in adult 03/10/2016  . GERD (gastroesophageal reflux disease) 03/10/2016  . Epigastric pain 06/04/2015  . Pharyngoesophageal dysphagia 02/10/2015  . S/P right knee arthroscopy 04/16/2014  . Chondromalacia of knee 02/26/2014  . Knee pain 02/20/2014  . Bilateral knee pain 02/05/2014  . Vitamin D deficiency 12/14/2013  . S/P gastric bypass 12/14/2013    Past Surgical History:  Procedure Laterality Date  . ABDOMINAL HYSTERECTOMY    . BALLOON DILATION N/A 10/01/2014   Procedure: BALLOON DILATION;  Surgeon: Garlan Fair, MD;  Location: [facility];  Service: Endoscopy;  Laterality: N/A;  . CYSTO/  BILATERAL RETROGRADE PYELOGRAM  11/20/2000  . CYSTO/  RIGHT RETROGRADE PYELOGRAM/  RIGHT URETEROSCOPY/  STENT PLACEMENT  12/16/2006  . CYSTOSCOPY/URETEROSCOPY/HOLMIUM  LASER/STENT PLACEMENT Left 11/09/2016   Procedure: CYSTOSCOPY/URETEROSCOPY/HOLMIUM LASER/STENT PLACEMENT;  Surgeon: Nickie Retort, MD;  Location: [facility];  Service: Urology;  Laterality: Left;  90 MINS  [phone]   . ESOPHAGEAL MANOMETRY N/A 12/23/2014    Procedure: ESOPHAGEAL MANOMETRY (EM);  Surgeon: Garlan Fair, MD;  Location: [facility];  Service: Endoscopy;  Laterality: N/A;  . ESOPHAGOGASTRODUODENOSCOPY  last one 03-28-2015  . ESOPHAGOGASTRODUODENOSCOPY (EGD) WITH PROPOFOL N/A 10/01/2014   Procedure: ESOPHAGOGASTRODUODENOSCOPY (EGD) WITH PROPOFOL;  Surgeon: Garlan Fair, MD;  Location: [facility];  Service: Endoscopy;  Laterality: N/A;  . EXTRACORPOREAL SHOCK WAVE LITHOTRIPSY  multiple times since age 3  . HOLMIUM LASER APPLICATION Left 04/01/6072   Procedure: HOLMIUM LASER APPLICATION;  Surgeon: Nickie Retort, MD;  Location: [facility];  Service: Urology;  Laterality: Left;  . KNEE ARTHROSCOPY Right 03-04-2014  [facility]   w/ Arthrotomy  . LAPAROSCOPIC ASSISTED VAGINAL HYSTERECTOMY  12/28/2005  . LAPAROSCOPIC CHOLECYSTECTOMY  01/17/2004  . LAPAROSCOPY LEFT OVARIAN CYSTECTOMY/  BILATERAL TUBAL LIGATION  02/26/2003  . LEFT URETEROSCOPIC STONE EXTRACTION /  STENT PLACEMENT  07/09/2002  . NECK EXPLORATION/  BILATERAL INFERIOR PARATHYROIDECTOMY  08/03/2010  . RIGHT URETERAL DILATION/  URETEROSCOPIC STONE EXTRACTION  06/12/2010  . ROUX-EN-Y GASTRIC BYPASS  2001  . SOLYX TRANSURETHRAL SLING/  POSTERIOR PELVIC FLOOR SACROSPINOUS REPAIR  02/21/2009   and Cysto/  Bilateral ureteral stent placement  . TONSILLECTOMY AND ADENOIDECTOMY    . WRIST GANGLION EXCISION Right 01/28/2000    OB History    No data available       Home Medications    Prior to Admission medications   Medication Sig Start Date End Date Taking? Authorizing Provider  acetaminophen (TYLENOL) 500 MG tablet Take 1,000 mg by mouth every 6 (six) hours as needed for headache.     [provider]  albuterol (PROVENTIL) (2.5 MG/3ML) 0.083% nebulizer solution Take 3 mLs (2.5 mg total) by nebulization every 4 (four) hours as needed for wheezing or shortness of breath. 09/20/14   Malvin Johns, MD  ALPRAZolam Duanne Moron) 1 MG tablet Take 1 mg by  mouth every 3 (three) hours as needed for anxiety.     [provider]  baclofen (LIORESAL) 10 MG tablet Take 10 mg by mouth 3 (three) times daily.    [provider]  buPROPion (WELLBUTRIN SR) 100 MG 12 hr tablet Take 100 mg by mouth 3 (three) times daily. AM, LUNCH, 1600    [provider]  ciprofloxacin (CIPRO) 500 MG tablet Take 1 tablet (500 mg total) by mouth 2 (two) times daily. 11/09/16   Nickie Retort, MD  erythromycin (ERY-TAB) 250 MG EC tablet Take 250 mg by mouth 3 (three) times daily.    [provider]  etanercept (ENBREL) 50 MG/ML injection Inject 50 mg into the skin once a week. friday's    [provider]  LINZESS 290 MCG CAPS capsule TAKE 1 CAPSULE BY MOUTH EVERY MORNING WITH MEAL 02/03/16   [provider]  lubiprostone (AMITIZA) 24 MCG capsule Take 24 mcg by mouth 2 (two) times daily with a meal.    [provider]  moxifloxacin (VIGAMOX) 0.5 % ophthalmic solution 1 drop. 11/10/14   [provider]  OLANZapine-FLUoxetine (SYMBYAX) 6-25 MG capsule Take 1 capsule by mouth at bedtime.    [provider]  omeprazole (PRILOSEC) 20 MG capsule Take 20 mg by mouth 2 (two) times daily before a meal.    [provider]  oxyCODONE-acetaminophen (PERCOCET/ROXICET) 5-325 MG tablet Take 1-2 tablets by mouth every 4 (four) hours as needed for severe pain. 11/09/16   Nickie Retort, MD  QUEtiapine (SEROQUEL XR) 300 MG 24 hr tablet Take 600 mg by mouth at bedtime.    [provider]  tamsulosin (FLOMAX) 0.4 MG CAPS capsule  12/02/16   [provider]  topiramate (TOPAMAX) 25 MG capsule Take 25 mg by mouth daily as needed (migraines).  12/10/14   [provider]    Family History Family History  Problem Relation Age of Onset  . Bipolar disorder Son   . Anxiety disorder Brother   . Depression Brother     Social History Social History  Substance Use Topics  . Smoking  status: Light Tobacco Smoker    Packs/day: 0.25    Years: 20.00    Types: Cigarettes  . Smokeless tobacco: Never Used  . Alcohol use No     Comment: occasional, once a month     Allergies   Lamictal [lamotrigine]; Penicillins; Sulfa antibiotics; and Morphine and related   Review of Systems Review of Systems  Constitutional:       Per HPI, otherwise negative  HENT:       Per HPI, otherwise negative  Respiratory:       Per HPI, otherwise negative  Cardiovascular:       Per HPI, otherwise negative  Gastrointestinal: Negative for vomiting.  Endocrine:       Negative aside from HPI  Genitourinary:       Neg aside from HPI   Musculoskeletal:       Per HPI, otherwise negative  Skin: Negative.   Neurological: Negative for syncope.  Hematological: Bruises/bleeds easily.     Physical Exam Updated Vital Signs BP 117/77 (BP Location: Left Arm)   Pulse 79   Temp 98.1 F (36.7 C) (Oral)   Resp 18   SpO2 100%   Physical Exam  Constitutional: She is oriented to person, place, and time. She appears well-developed and well-nourished. No distress.  HENT:  Head: Normocephalic and atraumatic.  Eyes: Conjunctivae and EOM are normal.  Cardiovascular: Normal rate and regular rhythm.   Pulmonary/Chest: Effort normal and breath sounds normal. No stridor. No respiratory distress.  Abdominal: She exhibits no distension.  Minimal discomfort anywhere, primary discomfort is in the suprapubic region, no guarding, no rebound.  Musculoskeletal: She exhibits no edema.  Neurological: She is alert and oriented to person, place, and time. No cranial nerve deficit.  Skin: Skin is warm and dry.  Multiple small areas of bruising in both upper tremors, both lower extremity.  Psychiatric: She has a normal mood and affect.  Nursing note and vitals reviewed.    ED Treatments / Results  Labs (all labs ordered are listed, but only abnormal results are displayed) Labs Reviewed  BASIC METABOLIC  PANEL - Abnormal; Notable for the following:       Result Value   Glucose, Bld 101 (*)    Calcium 8.8 (*)    All other components within normal limits  URINALYSIS, ROUTINE W REFLEX MICROSCOPIC - Abnormal; Notable for the following:    Leukocytes, UA SMALL (*)    All other components within normal limits  URINALYSIS, MICROSCOPIC (REFLEX) - Abnormal; Notable for the following:    Bacteria, UA FEW (*)    Squamous Epithelial / LPF 0-5 (*)    All other components within normal limits  CBC WITH DIFFERENTIAL/PLATELET    Procedures  Procedures (including critical care time)  Medications Ordered in ED Medications - No data to display   Initial Impression / Assessment and Plan / ED Course  I have reviewed the triage vital signs and the nursing notes.  Pertinent labs & imaging results that were available during my care of the patient were reviewed by me and considered in my medical decision making (see chart for details).  Well-appearing female presents with decreased urine production, mild suprapubic discomfort. Patient is awake, alert, with soft, non-peritoneal abdomen, unremarkable vital signs, labs, no evidence for bacteremia, sepsis. Patient does have some bruising in her extremities, but no substantial ecchymosis, no other deformities. Some suspicion for iatrogenic cause given her multiple medications. No evidence for acute urinary retention, UTI, kidney stone, kidney infection. Patient was started on Pyridium for possible cystitis, will follow-up with urology, as well as primary care in rheumatology.  Final Clinical Impressions(s) / ED Diagnoses  Dysuria   Carmin Muskrat, MD 07/30/17 1723

## 2017-07-30 NOTE — Discharge Instructions (Signed)
As discussed, your evaluation today has been largely reassuring.  But, it is important that you monitor your condition carefully, and do not hesitate to return to the ED if you develop new, or concerning changes in your condition.  Otherwise, please follow-up with your physician and our urologists for appropriate ongoing care.

## 2017-07-30 NOTE — ED Triage Notes (Signed)
Urinary retention x 2 weeks, sent from UC for eval of kidney failure.

## 2017-08-10 ENCOUNTER — Other Ambulatory Visit: Payer: Self-pay | Admitting: Obstetrics and Gynecology

## 2017-08-10 DIAGNOSIS — N63 Unspecified lump in unspecified breast: Secondary | ICD-10-CM

## 2017-08-15 ENCOUNTER — Other Ambulatory Visit: Payer: Self-pay | Admitting: Obstetrics and Gynecology

## 2017-08-15 ENCOUNTER — Ambulatory Visit
Admission: RE | Admit: 2017-08-15 | Discharge: 2017-08-15 | Disposition: A | Discharge status: Home / Self Care | Payer: 59 | Source: Ambulatory Visit | Attending: Obstetrics and Gynecology | Admitting: Obstetrics and Gynecology

## 2017-08-15 DIAGNOSIS — N63 Unspecified lump in unspecified breast: Secondary | ICD-10-CM

## 2017-08-15 DIAGNOSIS — N6320 Unspecified lump in the left breast, unspecified quadrant: Secondary | ICD-10-CM

## 2017-08-16 ENCOUNTER — Other Ambulatory Visit: Payer: Self-pay | Admitting: Obstetrics and Gynecology

## 2017-08-16 DIAGNOSIS — N6320 Unspecified lump in the left breast, unspecified quadrant: Secondary | ICD-10-CM

## 2017-08-17 ENCOUNTER — Ambulatory Visit
Admission: RE | Admit: 2017-08-17 | Discharge: 2017-08-17 | Disposition: A | Discharge status: Home / Self Care | Payer: 59 | Source: Ambulatory Visit | Attending: Obstetrics and Gynecology | Admitting: Obstetrics and Gynecology

## 2017-08-17 DIAGNOSIS — N6320 Unspecified lump in the left breast, unspecified quadrant: Secondary | ICD-10-CM

## 2017-09-07 ENCOUNTER — Other Ambulatory Visit: Payer: Self-pay

## 2017-09-07 ENCOUNTER — Encounter (HOSPITAL_BASED_OUTPATIENT_CLINIC_OR_DEPARTMENT_OTHER): Payer: Self-pay | Admitting: *Deleted

## 2017-09-07 ENCOUNTER — Emergency Department (HOSPITAL_COMMUNITY): Payer: 59

## 2017-09-07 ENCOUNTER — Emergency Department (HOSPITAL_BASED_OUTPATIENT_CLINIC_OR_DEPARTMENT_OTHER): Payer: 59

## 2017-09-07 ENCOUNTER — Emergency Department (HOSPITAL_BASED_OUTPATIENT_CLINIC_OR_DEPARTMENT_OTHER)
Admission: EM | Admit: 2017-09-07 | Discharge: 2017-09-07 | Disposition: A | Discharge status: Home / Self Care | Payer: 59 | Attending: Emergency Medicine | Admitting: Emergency Medicine

## 2017-09-07 DIAGNOSIS — H539 Unspecified visual disturbance: Secondary | ICD-10-CM

## 2017-09-07 DIAGNOSIS — R5383 Other fatigue: Secondary | ICD-10-CM | POA: Diagnosis not present

## 2017-09-07 DIAGNOSIS — R945 Abnormal results of liver function studies: Secondary | ICD-10-CM | POA: Diagnosis not present

## 2017-09-07 DIAGNOSIS — R7989 Other specified abnormal findings of blood chemistry: Secondary | ICD-10-CM

## 2017-09-07 DIAGNOSIS — F17210 Nicotine dependence, cigarettes, uncomplicated: Secondary | ICD-10-CM | POA: Insufficient documentation

## 2017-09-07 DIAGNOSIS — R4702 Dysphasia: Secondary | ICD-10-CM

## 2017-09-07 DIAGNOSIS — J45909 Unspecified asthma, uncomplicated: Secondary | ICD-10-CM | POA: Insufficient documentation

## 2017-09-07 DIAGNOSIS — M542 Cervicalgia: Secondary | ICD-10-CM | POA: Diagnosis not present

## 2017-09-07 DIAGNOSIS — Z79899 Other long term (current) drug therapy: Secondary | ICD-10-CM | POA: Insufficient documentation

## 2017-09-07 DIAGNOSIS — R499 Unspecified voice and resonance disorder: Secondary | ICD-10-CM | POA: Diagnosis not present

## 2017-09-07 DIAGNOSIS — H532 Diplopia: Secondary | ICD-10-CM | POA: Insufficient documentation

## 2017-09-07 LAB — DIFFERENTIAL
BASOS ABS: 0 10*3/uL (ref 0.0–0.1)
BASOS PCT: 0 %
Eosinophils Absolute: 0.5 10*3/uL (ref 0.0–0.7)
Eosinophils Relative: 5 %
LYMPHS PCT: 15 %
Lymphs Abs: 1.5 10*3/uL (ref 0.7–4.0)
MONO ABS: 0.8 10*3/uL (ref 0.1–1.0)
Monocytes Relative: 8 %
NEUTROS ABS: 7.3 10*3/uL (ref 1.7–7.7)
Neutrophils Relative %: 72 %

## 2017-09-07 LAB — CBC
HCT: 39.5 % (ref 36.0–46.0)
Hemoglobin: 13 g/dL (ref 12.0–15.0)
MCH: 32.1 pg (ref 26.0–34.0)
MCHC: 32.9 g/dL (ref 30.0–36.0)
MCV: 97.5 fL (ref 78.0–100.0)
PLATELETS: 250 10*3/uL (ref 150–400)
RBC: 4.05 MIL/uL (ref 3.87–5.11)
RDW: 13.5 % (ref 11.5–15.5)
WBC: 10.1 10*3/uL (ref 4.0–10.5)

## 2017-09-07 LAB — COMPREHENSIVE METABOLIC PANEL
ALBUMIN: 3.5 g/dL (ref 3.5–5.0)
ALK PHOS: 268 U/L — AB (ref 38–126)
ALT: 102 U/L — AB (ref 14–54)
AST: 68 U/L — AB (ref 15–41)
Anion gap: 6 (ref 5–15)
BUN: 13 mg/dL (ref 6–20)
CALCIUM: 8.8 mg/dL — AB (ref 8.9–10.3)
CHLORIDE: 108 mmol/L (ref 101–111)
CO2: 25 mmol/L (ref 22–32)
Creatinine, Ser: 0.94 mg/dL (ref 0.44–1.00)
GFR calc non Af Amer: >60 mL/min (ref >60)
GLUCOSE: 83 mg/dL (ref 65–99)
Potassium: 3.3 mmol/L — ABNORMAL LOW (ref 3.5–5.1)
SODIUM: 139 mmol/L (ref 135–145)
Total Bilirubin: 0.4 mg/dL (ref 0.3–1.2)
Total Protein: 5.9 g/dL — ABNORMAL LOW (ref 6.5–8.1)

## 2017-09-07 LAB — PROTIME-INR
INR: 1.02
PROTHROMBIN TIME: 13.3 s (ref 11.4–15.2)

## 2017-09-07 LAB — TROPONIN I: Troponin I: <0.03 ng/mL (ref <0.03)

## 2017-09-07 LAB — APTT: APTT: 27 s (ref 24–36)

## 2017-09-07 NOTE — ED Notes (Signed)
Pt ambulatory to bathroom

## 2017-09-07 NOTE — ED Notes (Signed)
Pt returned from CT °

## 2017-09-07 NOTE — ED Triage Notes (Signed)
Pt reports 2 weeks of difficulty walking, slurred speech and confusion. States "I'm afraid I'm having a stroke"

## 2017-09-07 NOTE — ED Notes (Signed)
Pt understood dc material. NAD noted. 

## 2017-09-07 NOTE — ED Provider Notes (Signed)
[place] EMERGENCY DEPARTMENT Provider Note   CSN: [id number] Arrival date & time: 09/07/17  1019     History   Chief Complaint Chief Complaint  Patient presents with  . Gait Problem  . Aphasia    HPI Jordan Russell is a 46 y.o. female.  HPI Patient presents with multiple complaints.  States she has had changes in her vision over the past few weeks.  Currently is experiencing side-by-side double vision.  She also complains that her speech has been slurring.  States that slurring is worse as the day progresses.  She has ongoing left hip pain and is now using a cane to ambulate.  Question whether there is any weakness to the legs.  She has a appointment to follow-up with an orthopedist.  She also complains of fatigue, neck and back pain.  No known trauma. Past Medical History:  Diagnosis Date  . Asthma   . Bipolar 1 disorder ([place])   . Fibromyalgia   . GAD (generalized anxiety disorder)   . GERD (gastroesophageal reflux disease)   . Hiatal hernia   . History of kidney stones   . History of primary hyperparathyroidism    s/p  bilateral inferior parathyroidectomy 08-03-2010  . IBS (irritable bowel syndrome)   . Left ureteral stone   . Migraine   . PONV (postoperative nausea and vomiting)    and claustrophobic with mask  . RA (rheumatoid arthritis) ([place])    dr Gavin Pound-- rheumtologist  . Self mutilating behavior   . Urgency of urination   . Wears glasses     Patient Active Problem List   Diagnosis Date Noted  . Asthma with acute exacerbation 12/17/2016  . Carpal tunnel syndrome of left wrist 12/17/2016  . Dysphagia, pharyngoesophageal phase 12/17/2016  . Fibromyalgia 12/17/2016  . H/O vitamin D deficiency 12/17/2016  . Hidradenitis suppurativa 12/17/2016  . Calculus of kidney 12/17/2016  . Hypoglycemia 12/17/2016  . Hyperglycemia 12/17/2016  . Hydronephrosis with renal and ureteral calculus obstruction 12/17/2016  . Hydradenitis 12/17/2016  .  Hx of iron deficiency anemia 12/17/2016  . HPTH (hyperparathyroidism) ([place]) 12/17/2016  . Nocturia 12/17/2016  . Intractable migraine with aura without status migrainosus 12/17/2016  . Pain in left knee 12/17/2016  . Arthralgia of multiple joints 12/17/2016  . Avitaminosis D 12/17/2016  . Anemia, iron deficiency 12/17/2016  . Cutaneous eruption 12/17/2016  . Pain in joint 12/17/2016  . Varicose veins of both legs with edema 06/28/2016  . Adverse effects of medication 03/10/2016  . Bipolar disorder ([place]) 03/10/2016  . Rheumatoid arthritis ([place]) 03/10/2016  . Migraine 03/10/2016  . IBS (irritable bowel syndrome) 03/10/2016  . Anxiety 03/10/2016  . Asthma in adult 03/10/2016  . GERD (gastroesophageal reflux disease) 03/10/2016  . Epigastric pain 06/04/2015  . Pharyngoesophageal dysphagia 02/10/2015  . S/P right knee arthroscopy 04/16/2014  . Chondromalacia of knee 02/26/2014  . Knee pain 02/20/2014  . Bilateral knee pain 02/05/2014  . Vitamin D deficiency 12/14/2013  . S/P gastric bypass 12/14/2013    Past Surgical History:  Procedure Laterality Date  . ABDOMINAL HYSTERECTOMY    . CYSTO/  BILATERAL RETROGRADE PYELOGRAM  11/20/2000  . CYSTO/  RIGHT RETROGRADE PYELOGRAM/  RIGHT URETEROSCOPY/  STENT PLACEMENT  12/16/2006  . ESOPHAGOGASTRODUODENOSCOPY  last one 03-28-2015  . EXTRACORPOREAL SHOCK WAVE LITHOTRIPSY  multiple times since age 58  . KNEE ARTHROSCOPY Right 03-04-2014  [facility]   w/ Arthrotomy  . LAPAROSCOPIC ASSISTED VAGINAL HYSTERECTOMY  12/28/2005  . LAPAROSCOPIC  CHOLECYSTECTOMY  01/17/2004  . LAPAROSCOPY LEFT OVARIAN CYSTECTOMY/  BILATERAL TUBAL LIGATION  02/26/2003  . LEFT URETEROSCOPIC STONE EXTRACTION /  STENT PLACEMENT  07/09/2002  . NECK EXPLORATION/  BILATERAL INFERIOR PARATHYROIDECTOMY  08/03/2010  . RIGHT URETERAL DILATION/  URETEROSCOPIC STONE EXTRACTION  06/12/2010  . ROUX-EN-Y GASTRIC BYPASS  2001  . SOLYX TRANSURETHRAL SLING/  POSTERIOR PELVIC FLOOR  SACROSPINOUS REPAIR  02/21/2009   and Cysto/  Bilateral ureteral stent placement  . TONSILLECTOMY AND ADENOIDECTOMY    . WRIST GANGLION EXCISION Right 01/28/2000    OB History    No data available       Home Medications    Prior to Admission medications   Medication Sig Start Date End Date Taking? Authorizing Provider  acetaminophen (TYLENOL) 500 MG tablet Take 1,000 mg by mouth every 6 (six) hours as needed for headache.     [provider]  albuterol (PROVENTIL) (2.5 MG/3ML) 0.083% nebulizer solution Take 3 mLs (2.5 mg total) by nebulization every 4 (four) hours as needed for wheezing or shortness of breath. 09/20/14   Malvin Johns, MD  ALPRAZolam Duanne Moron) 1 MG tablet Take 1 mg by mouth every 3 (three) hours as needed for anxiety.     [provider]  baclofen (LIORESAL) 10 MG tablet Take 10 mg by mouth 3 (three) times daily.    [provider]  buPROPion (WELLBUTRIN SR) 100 MG 12 hr tablet Take 100 mg by mouth 3 (three) times daily. AM, LUNCH, 1600    [provider]  ciprofloxacin (CIPRO) 500 MG tablet Take 1 tablet (500 mg total) by mouth 2 (two) times daily. 11/09/16   Nickie Retort, MD  erythromycin (ERY-TAB) 250 MG EC tablet Take 250 mg by mouth 3 (three) times daily.    [provider]  etanercept (ENBREL) 50 MG/ML injection Inject 50 mg into the skin once a week. friday's    [provider]  LINZESS 290 MCG CAPS capsule TAKE 1 CAPSULE BY MOUTH EVERY MORNING WITH MEAL 02/03/16   [provider]  lubiprostone (AMITIZA) 24 MCG capsule Take 24 mcg by mouth 2 (two) times daily with a meal.    [provider]  moxifloxacin (VIGAMOX) 0.5 % ophthalmic solution 1 drop. 11/10/14   [provider]  OLANZapine-FLUoxetine (SYMBYAX) 6-25 MG capsule Take 1 capsule by mouth at bedtime.    [provider]  omeprazole (PRILOSEC) 20 MG capsule Take 20 mg by mouth 2 (two) times daily before a meal.     [provider]  oxyCODONE-acetaminophen (PERCOCET/ROXICET) 5-325 MG tablet Take 1-2 tablets by mouth every 4 (four) hours as needed for severe pain. 11/09/16   Nickie Retort, MD  phenazopyridine (PYRIDIUM) 200 MG tablet Take 1 tablet (200 mg total) by mouth 3 (three) times daily. 07/30/17   Carmin Muskrat, MD  QUEtiapine (SEROQUEL XR) 300 MG 24 hr tablet Take 600 mg by mouth at bedtime.    [provider]  tamsulosin (FLOMAX) 0.4 MG CAPS capsule  12/02/16   [provider]  topiramate (TOPAMAX) 25 MG capsule Take 25 mg by mouth daily as needed (migraines).  12/10/14   [provider]    Family History Family History  Problem Relation Age of Onset  . Bipolar disorder Son   . Breast cancer Mother   . Breast cancer Sister   . Anxiety disorder Brother   . Depression Brother   . Breast cancer Maternal Aunt   . Breast cancer Maternal Grandmother  Social History Social History   Tobacco Use  . Smoking status: Light Tobacco Smoker    Packs/day: 0.25    Years: 20.00    Pack years: 5.00    Types: Cigarettes  . Smokeless tobacco: Never Used  Substance Use Topics  . Alcohol use: No    Comment: occasional, once a month  . Drug use: No     Allergies   Lamictal [lamotrigine]; Penicillins; Sulfa antibiotics; and Morphine and related   Review of Systems Review of Systems  Constitutional: Positive for fatigue. Negative for chills and fever.  HENT: Positive for voice change. Negative for congestion, facial swelling, rhinorrhea, sinus pressure, sore throat and trouble swallowing.   Eyes: Positive for visual disturbance. Negative for photophobia and pain.  Respiratory: Negative for cough, shortness of breath and wheezing.   Cardiovascular: Negative for chest pain, palpitations and leg swelling.  Gastrointestinal: Negative for abdominal pain, constipation, diarrhea, nausea and vomiting.  Genitourinary: Negative for dysuria, flank pain, frequency and  hematuria.  Musculoskeletal: Positive for arthralgias, back pain, gait problem, myalgias and neck pain. Negative for joint swelling.  Skin: Negative for rash and wound.  Neurological: Positive for speech difficulty, weakness and numbness. Negative for dizziness, syncope, light-headedness and headaches.  All other systems reviewed and are negative.    Physical Exam Updated Vital Signs BP 100/61   Pulse 82   Temp 98.1 F (36.7 C) (Oral)   Resp 18   Ht 5\' 7"  (1.702 m)   Wt 89.8 kg (198 lb)   SpO2 97%   BMI 31.01 kg/m   Physical Exam  Constitutional: She is oriented to person, place, and time. She appears well-developed and well-nourished. No distress.  HENT:  Head: Normocephalic and atraumatic.  Mouth/Throat: Oropharynx is clear and moist. No oropharyngeal exudate.  Eyes: EOM are normal. Pupils are equal, round, and reactive to light.  No nystagmus.  Normal conjugate gaze  Neck: Normal range of motion. Neck supple.  Diffuse posterior cervical tenderness.  No step-offs or deformity.  Cardiovascular: Normal rate and regular rhythm. Exam reveals no gallop and no friction rub.  No murmur heard. Pulmonary/Chest: Effort normal and breath sounds normal. No respiratory distress. She has no wheezes. She has no rales.  Abdominal: Soft. Bowel sounds are normal. She exhibits no distension and no mass. There is no tenderness. There is no rebound and no guarding. No hernia.  Musculoskeletal: Normal range of motion. She exhibits no edema or tenderness.  No midline or thoracic or lumbar tenderness.  Negative straight leg raise bilaterally.  No lower extremity swelling, asymmetry or tenderness.  Patient with some pain with range of motion of the left hip.  No obvious deformity, warmth or swelling.  Distal pulses are 2+.  Lymphadenopathy:    She has no cervical adenopathy.  Neurological: She is alert and oriented to person, place, and time.  Inconsistent slurring of words.  Cranial nerves II  through XII grossly intact though patient does admit to having some decreased sensation to the left forehead.  5/5 grip strength bilaterally.  5/5 bilateral lower extremity strength.   Skin: Skin is warm and dry. Capillary refill takes less than 2 seconds. No rash noted. No erythema.  Psychiatric: She has a normal mood and affect. Her behavior is normal.  Nursing note and vitals reviewed.    ED Treatments / Results  Labs (all labs ordered are listed, but only abnormal results are displayed) Labs Reviewed  COMPREHENSIVE METABOLIC PANEL - Abnormal; Notable for the following components:  Result Value   Potassium 3.3 (*)    Calcium 8.8 (*)    Total Protein 5.9 (*)    AST 68 (*)    ALT 102 (*)    Alkaline Phosphatase 268 (*)    All other components within normal limits  PROTIME-INR  APTT  CBC  DIFFERENTIAL  TROPONIN I    EKG  EKG Interpretation None       Radiology Ct Head Wo Contrast  Result Date: 09/07/2017 CLINICAL DATA:  46 year old female with headache, vision changes and confusion. EXAM: CT HEAD WITHOUT CONTRAST TECHNIQUE: Contiguous axial images were obtained from the base of the skull through the vertex without intravenous contrast. COMPARISON:  04/04/2017 CT and prior exams FINDINGS: Brain: No evidence of acute infarction, hemorrhage, hydrocephalus, extra-axial collection or mass lesion/mass effect. Mild atrophy again noted. Vascular: No hyperdense vessel or unexpected calcification. Skull: Normal. Negative for fracture or focal lesion. Sinuses/Orbits: No acute finding. Other: None. IMPRESSION: 1. No evidence of acute intracranial abnormality 2. Mild atrophy Electronically Signed   By: Margarette Canada M.D.   On: 09/07/2017 13:52    Procedures Procedures (including critical care time)  Medications Ordered in ED Medications - No data to display   Initial Impression / Assessment and Plan / ED Course  I have reviewed the triage vital signs and the nursing  notes.  Pertinent labs & imaging results that were available during my care of the patient were reviewed by me and considered in my medical decision making (see chart for details).     CT head without acute findings.  Discussed with Dr. Ayesha Rumpf at [facility] emergency department.  Will transfer for MRI brain.  Final Clinical Impressions(s) / ED Diagnoses   Final diagnoses:  Dysphasia  Visual changes  Abnormal liver function test    ED Discharge Orders    None       Julianne Rice, MD 09/07/17 1430

## 2017-09-07 NOTE — ED Notes (Addendum)
(  note deleted, wrong patient)

## 2017-09-07 NOTE — ED Notes (Signed)
MRI called regarding pt. MRI not aware pt was to be scanned today, report there is no order placed at this time.

## 2017-09-07 NOTE — ED Notes (Signed)
Patient walked to room 10 using only her cane for assistance. Patient walked slow but seemed steady.

## 2017-09-07 NOTE — ED Provider Notes (Signed)
2220: Patient feeling much better.  No gross neurological deficits.  MRI of brain was negative.  This was discussed with the patient and her husband.   Nat Christen, MD 09/07/17 2229

## 2017-09-07 NOTE — ED Notes (Signed)
Dr Lacinda Axon aware of pt status, MRI brain to be ordered. Pt updated.

## 2017-09-07 NOTE — ED Notes (Signed)
Pt transported to MRI at this time 

## 2017-09-07 NOTE — Discharge Instructions (Signed)
MRI of your brain showed no acute problems.  Follow-up your primary care doctor.

## 2017-09-07 NOTE — ED Notes (Signed)
ED Provider at bedside. 

## 2017-09-08 ENCOUNTER — Ambulatory Visit (INDEPENDENT_AMBULATORY_CARE_PROVIDER_SITE_OTHER): Payer: 59 | Admitting: Orthopaedic Surgery

## 2017-09-08 ENCOUNTER — Ambulatory Visit (INDEPENDENT_AMBULATORY_CARE_PROVIDER_SITE_OTHER): Payer: 59

## 2017-09-08 ENCOUNTER — Encounter (INDEPENDENT_AMBULATORY_CARE_PROVIDER_SITE_OTHER): Payer: Self-pay | Admitting: Orthopaedic Surgery

## 2017-09-08 DIAGNOSIS — M7062 Trochanteric bursitis, left hip: Secondary | ICD-10-CM

## 2017-09-08 DIAGNOSIS — M25552 Pain in left hip: Secondary | ICD-10-CM

## 2017-09-08 DIAGNOSIS — M25551 Pain in right hip: Secondary | ICD-10-CM | POA: Insufficient documentation

## 2017-09-08 HISTORY — DX: Trochanteric bursitis, left hip: M70.62

## 2017-09-08 HISTORY — DX: Pain in left hip: M25.552

## 2017-09-08 MED ORDER — METHYLPREDNISOLONE ACETATE 40 MG/ML IJ SUSP
40.0000 mg | INTRAMUSCULAR | Status: AC | PRN
  Administered 2017-09-08: 40 mg via INTRA_ARTICULAR

## 2017-09-08 MED ORDER — LIDOCAINE HCL 1 % IJ SOLN
3.0000 mL | INTRAMUSCULAR | Status: AC | PRN
  Administered 2017-09-08: 3 mL

## 2017-09-08 NOTE — Progress Notes (Signed)
Office Visit Note   Patient: Jordan Russell           Date of Birth: 1970/12/10           MRN: [id number] Visit Date: 09/08/2017              Requested by: Josetta Huddle, MD [address] [place], [place] [address] PCP: Josetta Huddle, MD   Assessment & Plan: Visit Diagnoses:  1. Pain in left hip   2. Trochanteric bursitis, left hip     Plan: This is classic trochanteric bursitis with iliotibial band syndrome.  I do feel that she would and if it from formal physical therapy.  She did wish to try steroid injection of the trochanteric area and she tolerated this well.  I explained the risks and benefits of injections.  She will follow-up as needed and after showed her some stretching exercises to try and some Internet sites that show her some therapy.  Hopefully this will help.  If not she will call could be considered for formal outpatient physical therapy to try even a TENS unit and dry needling.  All questions and concerns were answered and addressed.  Follow-Up Instructions: Return if symptoms worsen or fail to improve.   Orders:  Orders Placed This Encounter  Procedures  . Large Joint Inj  . XR HIP UNILAT W OR W/O PELVIS 1V LEFT   No orders of the defined types were placed in this encounter.     Procedures: Large Joint Inj: L greater trochanter on 09/08/2017 3:54 PM Indications: pain and diagnostic evaluation Details: 22 G 1.5 in needle, lateral approach  Arthrogram: No  Medications: 3 mL lidocaine 1 %; 40 mg methylPREDNISolone acetate 40 MG/ML Outcome: tolerated well, no immediate complications Procedure, treatment alternatives, risks and benefits explained, specific risks discussed. Consent was given by the patient. Immediately prior to procedure a time out was called to verify the correct patient, procedure, equipment, support staff and site/side marked as required. Patient was prepped and draped in the usual sterile fashion.       Clinical  Data: No additional findings.   Subjective: Chief Complaint  Patient presents with  . Left Hip - Pain  The patient is a 46 year old female who comes in with approximately 1 year of worsening left hip pain.  She sees a rheumatologist and was told she needed to probably see an orthopedic surgeon at some point to really assess for any other arthritic symptoms of her hip.  She does have a history of probable neuralgia and was significantly morbidly obese sometime ago but is lost weight over the years.  She feels like she states it comes out of socket.  She has had oral steroids as well as injections in the not help.  She denies any groin pain.  She points to the side of her hip and the lateral aspect of the source of the pain.  HPI  Review of Systems She currently denies any fever, chills, shortness of breath, chest pain  Objective: Vital Signs: There were no vitals taken for this visit.  Physical Exam She is alert and oriented x3 and in no acute distress Ortho Exam Examination of both of her hips shows full range of motion of both hips with no blocks rotation no pain in the groin at all.  There is no leg length discrepancy.  Her only pain is of the trochanteric area and the iliotibial band. Specialty Comments:  No specialty comments available.  Imaging: Mr Brain Wo Contrast  Result Date: 09/07/2017 CLINICAL DATA:  Initial evaluation for acute visual disturbance, double vision. EXAM: MRI HEAD WITHOUT CONTRAST TECHNIQUE: Multiplanar, multiecho pulse sequences of the brain and surrounding structures were obtained without intravenous contrast. COMPARISON:  [facility] CT from earlier the same day. FINDINGS: Brain: Age-appropriate cerebral atrophy. Few tiny subcentimeter T2/FLAIR hyperintense foci noted within the periventricular and deep white matter both cerebral hemispheres, nonspecific, but felt to be within normal limits for age. No abnormal foci of restricted diffusion to suggest acute or  subacute ischemia. No foci of susceptibility artifact to suggest acute or chronic intracranial hemorrhage. No encephalomalacia to suggest chronic infarction. No mass lesion, midline shift or mass effect. No hydrocephalus. No extra-axial fluid collection. Major dural sinuses are grossly patent. Incidental note made of a partially empty sella. Midline structures intact and normal. Vascular: Major intracranial vascular flow voids maintained. Skull and upper cervical spine: Craniocervical junction within normal limits. Upper cervical spine unremarkable. Bone marrow signal intensity normal. No scalp soft tissue abnormality. Scaphocephaly noted. Sinuses/Orbits: Globes and orbital soft tissues within normal limits. Other: Paranasal sinuses are clear. No mastoid effusion. Inner ear structures normal. IMPRESSION: Normal brain MRI for age. No acute intracranial abnormality identified. Electronically Signed   By: Jeannine Boga M.D.   On: 09/07/2017 20:59   Xr Hip Unilat W Or W/o Pelvis 1v Left  Result Date: 09/08/2017 An AP pelvis and lateral of the left hip shows normal-appearing hips.  There is no cortical irregularities around the joints or the trochanteric areas.  The hip joint space is very well-maintained with no acute findings.  There is no significant arthritis that can be visualized at all.  The hips basically are normal.    PMFS History: Patient Active Problem List   Diagnosis Date Noted  . Trochanteric bursitis, left hip 09/08/2017  . Pain in left hip 09/08/2017  . Asthma with acute exacerbation 12/17/2016  . Carpal tunnel syndrome of left wrist 12/17/2016  . Dysphagia, pharyngoesophageal phase 12/17/2016  . Fibromyalgia 12/17/2016  . H/O vitamin D deficiency 12/17/2016  . Hidradenitis suppurativa 12/17/2016  . Calculus of kidney 12/17/2016  . Hypoglycemia 12/17/2016  . Hyperglycemia 12/17/2016  . Hydronephrosis with renal and ureteral calculus obstruction 12/17/2016  . Hydradenitis  12/17/2016  . Hx of iron deficiency anemia 12/17/2016  . HPTH (hyperparathyroidism) ([place]) 12/17/2016  . Nocturia 12/17/2016  . Intractable migraine with aura without status migrainosus 12/17/2016  . Pain in left knee 12/17/2016  . Arthralgia of multiple joints 12/17/2016  . Avitaminosis D 12/17/2016  . Anemia, iron deficiency 12/17/2016  . Cutaneous eruption 12/17/2016  . Pain in joint 12/17/2016  . Varicose veins of both legs with edema 06/28/2016  . Adverse effects of medication 03/10/2016  . Bipolar disorder ([place]) 03/10/2016  . Rheumatoid arthritis ([place]) 03/10/2016  . Migraine 03/10/2016  . IBS (irritable bowel syndrome) 03/10/2016  . Anxiety 03/10/2016  . Asthma in adult 03/10/2016  . GERD (gastroesophageal reflux disease) 03/10/2016  . Epigastric pain 06/04/2015  . Pharyngoesophageal dysphagia 02/10/2015  . S/P right knee arthroscopy 04/16/2014  . Chondromalacia of knee 02/26/2014  . Knee pain 02/20/2014  . Bilateral knee pain 02/05/2014  . Vitamin D deficiency 12/14/2013  . S/P gastric bypass 12/14/2013   Past Medical History:  Diagnosis Date  . Asthma   . Bipolar 1 disorder ([place])   . Fibromyalgia   . GAD (generalized anxiety disorder)   . GERD (gastroesophageal reflux disease)   . Hiatal hernia   .  History of kidney stones   . History of primary hyperparathyroidism    s/p  bilateral inferior parathyroidectomy 08-03-2010  . IBS (irritable bowel syndrome)   . Left ureteral stone   . Migraine   . PONV (postoperative nausea and vomiting)    and claustrophobic with mask  . RA (rheumatoid arthritis) ([place])    dr Gavin Pound-- rheumtologist  . Self mutilating behavior   . Urgency of urination   . Wears glasses     Family History  Problem Relation Age of Onset  . Bipolar disorder Son   . Breast cancer Mother   . Breast cancer Sister   . Anxiety disorder Brother   . Depression Brother   . Breast cancer Maternal Aunt   . Breast cancer Maternal Grandmother      Past Surgical History:  Procedure Laterality Date  . ABDOMINAL HYSTERECTOMY    . CYSTO/  BILATERAL RETROGRADE PYELOGRAM  11/20/2000  . CYSTO/  RIGHT RETROGRADE PYELOGRAM/  RIGHT URETEROSCOPY/  STENT PLACEMENT  12/16/2006  . ESOPHAGOGASTRODUODENOSCOPY  last one 03-28-2015  . EXTRACORPOREAL SHOCK WAVE LITHOTRIPSY  multiple times since age 53  . KNEE ARTHROSCOPY Right 03-04-2014  [facility]   w/ Arthrotomy  . LAPAROSCOPIC ASSISTED VAGINAL HYSTERECTOMY  12/28/2005  . LAPAROSCOPIC CHOLECYSTECTOMY  01/17/2004  . LAPAROSCOPY LEFT OVARIAN CYSTECTOMY/  BILATERAL TUBAL LIGATION  02/26/2003  . LEFT URETEROSCOPIC STONE EXTRACTION /  STENT PLACEMENT  07/09/2002  . NECK EXPLORATION/  BILATERAL INFERIOR PARATHYROIDECTOMY  08/03/2010  . RIGHT URETERAL DILATION/  URETEROSCOPIC STONE EXTRACTION  06/12/2010  . ROUX-EN-Y GASTRIC BYPASS  2001  . SOLYX TRANSURETHRAL SLING/  POSTERIOR PELVIC FLOOR SACROSPINOUS REPAIR  02/21/2009   and Cysto/  Bilateral ureteral stent placement  . TONSILLECTOMY AND ADENOIDECTOMY    . WRIST GANGLION EXCISION Right 01/28/2000   Social History   Occupational History  . Not on file  Tobacco Use  . Smoking status: Light Tobacco Smoker    Packs/day: 0.25    Years: 20.00    Pack years: 5.00    Types: Cigarettes  . Smokeless tobacco: Never Used  Substance and Sexual Activity  . Alcohol use: No    Comment: occasional, once a month  . Drug use: No  . Sexual activity: Yes    Birth control/protection: Surgical

## 2017-09-27 ENCOUNTER — Other Ambulatory Visit: Payer: Self-pay | Admitting: Internal Medicine

## 2017-09-27 DIAGNOSIS — R7989 Other specified abnormal findings of blood chemistry: Secondary | ICD-10-CM

## 2017-09-27 DIAGNOSIS — R945 Abnormal results of liver function studies: Secondary | ICD-10-CM

## 2017-09-30 ENCOUNTER — Ambulatory Visit
Admission: RE | Admit: 2017-09-30 | Discharge: 2017-09-30 | Disposition: A | Discharge status: Home / Self Care | Payer: 59 | Source: Ambulatory Visit | Attending: Internal Medicine | Admitting: Internal Medicine

## 2017-09-30 DIAGNOSIS — R945 Abnormal results of liver function studies: Secondary | ICD-10-CM

## 2017-09-30 DIAGNOSIS — R7989 Other specified abnormal findings of blood chemistry: Secondary | ICD-10-CM

## 2017-12-13 ENCOUNTER — Ambulatory Visit (INDEPENDENT_AMBULATORY_CARE_PROVIDER_SITE_OTHER): Payer: 59 | Admitting: Orthopaedic Surgery

## 2018-02-03 ENCOUNTER — Encounter: Payer: Self-pay | Admitting: Neurology

## 2018-02-06 ENCOUNTER — Other Ambulatory Visit: Payer: Self-pay | Admitting: Obstetrics and Gynecology

## 2018-02-06 DIAGNOSIS — N6320 Unspecified lump in the left breast, unspecified quadrant: Secondary | ICD-10-CM

## 2018-02-16 ENCOUNTER — Ambulatory Visit
Admission: RE | Admit: 2018-02-16 | Discharge: 2018-02-16 | Disposition: A | Discharge status: Home / Self Care | Payer: 59 | Source: Ambulatory Visit | Attending: Obstetrics and Gynecology | Admitting: Obstetrics and Gynecology

## 2018-02-16 DIAGNOSIS — N6320 Unspecified lump in the left breast, unspecified quadrant: Secondary | ICD-10-CM

## 2018-04-11 ENCOUNTER — Other Ambulatory Visit (HOSPITAL_COMMUNITY): Payer: Self-pay | Admitting: Gastroenterology

## 2018-04-11 DIAGNOSIS — R945 Abnormal results of liver function studies: Principal | ICD-10-CM

## 2018-04-11 DIAGNOSIS — R7989 Other specified abnormal findings of blood chemistry: Secondary | ICD-10-CM

## 2018-04-19 ENCOUNTER — Other Ambulatory Visit: Payer: Self-pay | Admitting: Radiology

## 2018-04-20 ENCOUNTER — Ambulatory Visit (HOSPITAL_COMMUNITY)
Admission: RE | Admit: 2018-04-20 | Discharge: 2018-04-20 | Disposition: A | Discharge status: Home / Self Care | Payer: 59 | Source: Ambulatory Visit | Attending: Gastroenterology | Admitting: Gastroenterology

## 2018-04-20 DIAGNOSIS — Z79890 Hormone replacement therapy (postmenopausal): Secondary | ICD-10-CM | POA: Insufficient documentation

## 2018-04-20 DIAGNOSIS — Z79899 Other long term (current) drug therapy: Secondary | ICD-10-CM | POA: Insufficient documentation

## 2018-04-20 DIAGNOSIS — K589 Irritable bowel syndrome without diarrhea: Secondary | ICD-10-CM | POA: Insufficient documentation

## 2018-04-20 DIAGNOSIS — K219 Gastro-esophageal reflux disease without esophagitis: Secondary | ICD-10-CM | POA: Diagnosis not present

## 2018-04-20 DIAGNOSIS — F319 Bipolar disorder, unspecified: Secondary | ICD-10-CM | POA: Insufficient documentation

## 2018-04-20 DIAGNOSIS — R945 Abnormal results of liver function studies: Secondary | ICD-10-CM | POA: Diagnosis present

## 2018-04-20 DIAGNOSIS — Z87442 Personal history of urinary calculi: Secondary | ICD-10-CM | POA: Insufficient documentation

## 2018-04-20 DIAGNOSIS — R7989 Other specified abnormal findings of blood chemistry: Secondary | ICD-10-CM

## 2018-04-20 DIAGNOSIS — M069 Rheumatoid arthritis, unspecified: Secondary | ICD-10-CM | POA: Insufficient documentation

## 2018-04-20 DIAGNOSIS — M797 Fibromyalgia: Secondary | ICD-10-CM | POA: Diagnosis not present

## 2018-04-20 DIAGNOSIS — F17210 Nicotine dependence, cigarettes, uncomplicated: Secondary | ICD-10-CM | POA: Diagnosis not present

## 2018-04-20 DIAGNOSIS — J45909 Unspecified asthma, uncomplicated: Secondary | ICD-10-CM | POA: Diagnosis not present

## 2018-04-20 LAB — CBC
HEMATOCRIT: 39 % (ref 36.0–46.0)
Hemoglobin: 12.4 g/dL (ref 12.0–15.0)
MCH: 31.3 pg (ref 26.0–34.0)
MCHC: 31.8 g/dL (ref 30.0–36.0)
MCV: 98.5 fL (ref 78.0–100.0)
Platelets: 236 10*3/uL (ref 150–400)
RBC: 3.96 MIL/uL (ref 3.87–5.11)
RDW: 13 % (ref 11.5–15.5)
WBC: 4.4 10*3/uL (ref 4.0–10.5)

## 2018-04-20 LAB — PROTIME-INR
INR: 0.99
Prothrombin Time: 13 seconds (ref 11.4–15.2)

## 2018-04-20 LAB — APTT: aPTT: 25 seconds (ref 24–36)

## 2018-04-20 MED ORDER — MIDAZOLAM HCL 2 MG/2ML IJ SOLN
INTRAMUSCULAR | Status: AC | PRN
  Administered 2018-04-20 (×2): 1 mg via INTRAVENOUS

## 2018-04-20 MED ORDER — SODIUM CHLORIDE 0.9 % IV SOLN: INTRAVENOUS | Status: DC

## 2018-04-20 MED ORDER — FENTANYL CITRATE (PF) 100 MCG/2ML IJ SOLN
INTRAMUSCULAR | Status: AC
  Filled 2018-04-20: qty 4

## 2018-04-20 MED ORDER — ONDANSETRON HCL 4 MG/2ML IJ SOLN
4.0000 mg | Freq: Once | INTRAMUSCULAR | Status: AC
  Administered 2018-04-20: 4 mg via INTRAVENOUS
  Filled 2018-04-20: qty 2

## 2018-04-20 MED ORDER — ONDANSETRON HCL 4 MG/2ML IJ SOLN
INTRAMUSCULAR | Status: AC
  Filled 2018-04-20: qty 2

## 2018-04-20 MED ORDER — FENTANYL CITRATE (PF) 100 MCG/2ML IJ SOLN
INTRAMUSCULAR | Status: AC | PRN
  Administered 2018-04-20 (×2): 50 ug via INTRAVENOUS

## 2018-04-20 MED ORDER — LIDOCAINE-EPINEPHRINE 1 %-1:100000 IJ SOLN
INTRAMUSCULAR | Status: AC
  Filled 2018-04-20: qty 1

## 2018-04-20 MED ORDER — GELATIN ABSORBABLE 12-7 MM EX MISC
CUTANEOUS | Status: AC
  Filled 2018-04-20: qty 1

## 2018-04-20 MED ORDER — MIDAZOLAM HCL 2 MG/2ML IJ SOLN
INTRAMUSCULAR | Status: AC
  Filled 2018-04-20: qty 4

## 2018-04-20 NOTE — Discharge Instructions (Signed)
Liver Biopsy, Care After °These instructions give you information on caring for yourself after your procedure. Your doctor may also give you more specific instructions. Call your doctor if you have any problems or questions after your procedure. °Follow these instructions at home: °· Rest at home for 1-2 days or as told by your doctor. °· Have someone stay with you for at least 24 hours. °· Do not do these things in the first 24 hours: °? Drive. °? Use machinery. °? Take care of other people. °? Sign legal documents. °? Take a bath or shower. °· There are many different ways to close and cover a cut (incision). For example, a cut can be closed with stitches, skin glue, or adhesive strips. Follow your doctor's instructions on: °? Taking care of your cut. °? Changing and removing your bandage (dressing). °? Removing whatever was used to close your cut. °· Do not drink alcohol in the first week. °· Do not lift more than 5 pounds or play contact sports for the first 2 weeks. °· Take medicines only as told by your doctor. For 1 week, do not take medicine that has aspirin in it or medicines like ibuprofen. °· Get your test results. °Contact a doctor if: °· A cut bleeds and leaves more than just a small spot of blood. °· A cut is red, puffs up (swells), or hurts more than before. °· Fluid or something else comes from a cut. °· A cut smells bad. °· You have a fever or chills. °Get help right away if: °· You have swelling, bloating, or pain in your belly (abdomen). °· You get dizzy or faint. °· You have a rash. °· You feel sick to your stomach (nauseous) or throw up (vomit). °· You have trouble breathing, feel short of breath, or feel faint. °· Your chest hurts. °· You have problems talking or seeing. °· You have trouble balancing or moving your arms or legs. °This information is not intended to replace advice given to you by your health care provider. Make sure you discuss any questions you have with your health care  provider. °Document Released: 07/27/2008 Document Revised: 03/25/2016 Document Reviewed: 12/14/2013 °Elsevier Interactive Patient Education © 2018 [employer] ° °

## 2018-04-20 NOTE — Procedures (Signed)
Pre Procedure Dx: Elevated LFTs of uncertain etiology Post Procedural Dx: Same  Technically successful US guided biopsy of the right lobe of the liver  EBL: Trace  No immediate complications.   Ronny Bacon, MD Pager #: [phone]

## 2018-04-20 NOTE — H&P (Signed)
Chief Complaint: Patient was seen in consultation today for  at the request of Ronnette Juniper  Referring Physician(s): Ronnette Juniper  Supervising Physician: Sandi Mariscal  Patient Status: [facility] - Out-pt  History of Present Illness: Jordan Russell is a 47 y.o. female being worked up for elevated LFTs. She is referred for random liver biopsy PMHx, meds, labs, imaging, allergies reviewed. Feels well, no recent fevers, chills, illness. Has been NPO today as directed. Family at bedside.   Past Medical History:  Diagnosis Date  . Asthma   . Bipolar 1 disorder ([place])   . Fibromyalgia   . GAD (generalized anxiety disorder)   . GERD (gastroesophageal reflux disease)   . Hiatal hernia   . History of kidney stones   . History of primary hyperparathyroidism    s/p  bilateral inferior parathyroidectomy 08-03-2010  . IBS (irritable bowel syndrome)   . Left ureteral stone   . Migraine   . PONV (postoperative nausea and vomiting)    and claustrophobic with mask  . RA (rheumatoid arthritis) ([place])    dr Gavin Pound-- rheumtologist  . Self mutilating behavior   . Urgency of urination   . Wears glasses     Past Surgical History:  Procedure Laterality Date  . ABDOMINAL HYSTERECTOMY    . BALLOON DILATION N/A 10/01/2014   Procedure: BALLOON DILATION;  Surgeon: Garlan Fair, MD;  Location: [facility];  Service: Endoscopy;  Laterality: N/A;  . CYSTO/  BILATERAL RETROGRADE PYELOGRAM  11/20/2000  . CYSTO/  RIGHT RETROGRADE PYELOGRAM/  RIGHT URETEROSCOPY/  STENT PLACEMENT  12/16/2006  . CYSTOSCOPY/URETEROSCOPY/HOLMIUM LASER/STENT PLACEMENT Left 11/09/2016   Procedure: CYSTOSCOPY/URETEROSCOPY/HOLMIUM LASER/STENT PLACEMENT;  Surgeon: Nickie Retort, MD;  Location: [facility];  Service: Urology;  Laterality: Left;  90 MINS  [phone]   . ESOPHAGEAL MANOMETRY N/A 12/23/2014   Procedure: ESOPHAGEAL MANOMETRY (EM);  Surgeon: Garlan Fair, MD;  Location: [facility];  Service: Endoscopy;  Laterality: N/A;  . ESOPHAGOGASTRODUODENOSCOPY  last one 03-28-2015  . ESOPHAGOGASTRODUODENOSCOPY (EGD) WITH PROPOFOL N/A 10/01/2014   Procedure: ESOPHAGOGASTRODUODENOSCOPY (EGD) WITH PROPOFOL;  Surgeon: Garlan Fair, MD;  Location: [facility];  Service: Endoscopy;  Laterality: N/A;  . EXTRACORPOREAL SHOCK WAVE LITHOTRIPSY  multiple times since age 79  . HOLMIUM LASER APPLICATION Left 11/02/8784   Procedure: HOLMIUM LASER APPLICATION;  Surgeon: Nickie Retort, MD;  Location: [facility];  Service: Urology;  Laterality: Left;  . KNEE ARTHROSCOPY Right 03-04-2014  [facility]   w/ Arthrotomy  . LAPAROSCOPIC ASSISTED VAGINAL HYSTERECTOMY  12/28/2005  . LAPAROSCOPIC CHOLECYSTECTOMY  01/17/2004  . LAPAROSCOPY LEFT OVARIAN CYSTECTOMY/  BILATERAL TUBAL LIGATION  02/26/2003  . LEFT URETEROSCOPIC STONE EXTRACTION /  STENT PLACEMENT  07/09/2002  . NECK EXPLORATION/  BILATERAL INFERIOR PARATHYROIDECTOMY  08/03/2010  . RIGHT URETERAL DILATION/  URETEROSCOPIC STONE EXTRACTION  06/12/2010  . ROUX-EN-Y GASTRIC BYPASS  2001  . SOLYX TRANSURETHRAL SLING/  POSTERIOR PELVIC FLOOR SACROSPINOUS REPAIR  02/21/2009   and Cysto/  Bilateral ureteral stent placement  . TONSILLECTOMY AND ADENOIDECTOMY    . WRIST GANGLION EXCISION Right 01/28/2000    Allergies: Lamictal [lamotrigine]; Penicillins; Sulfa antibiotics; and Morphine and related  Medications: Prior to Admission medications   Medication Sig Start Date End Date Taking? Authorizing Provider  ALPRAZolam Duanne Moron) 1 MG tablet Take 1 mg by mouth 4 (four) times daily as needed for anxiety.    Yes [provider]  buPROPion (WELLBUTRIN SR) 100 MG 12 hr tablet Take 100 mg  by mouth 3 (three) times daily. AM, LUNCH, 1600    Yes [provider]  cyanocobalamin (,VITAMIN B-12,) 1000 MCG/ML injection Inject 1,000 mcg every 30 (thirty) days into the muscle. 07/28/17  Yes [provider]    cyclobenzaprine (FLEXERIL) 5 MG tablet Take 5 mg by mouth 3 (three) times daily as needed for muscle spasms.   Yes [provider]  etanercept (ENBREL) 50 MG/ML injection Inject 50 mg into the skin once a week. friday's   Yes [provider]  hydrocortisone (ANUSOL-HC) 25 MG suppository Place 25 mg rectally 2 (two) times daily as needed for hemorrhoids or anal itching.   Yes [provider]  hydroxychloroquine (PLAQUENIL) 200 MG tablet Take 200 mg by mouth 2 (two) times daily.   Yes [provider]  LATUDA 80 MG TABS tablet Take 80 mg by mouth at bedtime.  08/27/17  Yes [provider]  levothyroxine (SYNTHROID, LEVOTHROID) 50 MCG tablet Take 50 mcg by mouth daily before breakfast.   Yes [provider]  omeprazole (PRILOSEC) 20 MG capsule Take 20 mg by mouth 2 (two) times daily before a meal.   Yes [provider]  promethazine (PHENERGAN) 25 MG tablet Take 25 mg by mouth every 6 (six) hours as needed for nausea or vomiting.   Yes [provider]  QUEtiapine (SEROQUEL) 300 MG tablet Take 600 mg by mouth at bedtime.   Yes [provider]  acetaminophen (TYLENOL) 500 MG tablet Take 1,000 mg by mouth every 6 (six) hours as needed for headache.     [provider]  albuterol (PROVENTIL) (2.5 MG/3ML) 0.083% nebulizer solution Take 3 mLs (2.5 mg total) by nebulization every 4 (four) hours as needed for wheezing or shortness of breath. 09/20/14   Malvin Johns, MD  sodium chloride (OCEAN) 0.65 % SOLN nasal spray Place 1 spray into both nostrils as needed for congestion.    [provider]  topiramate (TOPAMAX) 25 MG capsule Take 25 mg by mouth daily as needed (migraines).  12/10/14   [provider]     Family History  Problem Relation Age of Onset  . Bipolar disorder Son   . Breast cancer Mother   . Breast cancer Sister   . Anxiety disorder Brother   . Depression Brother   . Breast cancer  Maternal Aunt   . Breast cancer Maternal Grandmother     Social History   Socioeconomic History  . Marital status: Married    Spouse name: Not on file  . Number of children: Not on file  . Years of education: Not on file  . Highest education level: Not on file  Occupational History  . Not on file  Social Needs  . Financial resource strain: Not on file  . Food insecurity:    Worry: Not on file    Inability: Not on file  . Transportation needs:    Medical: Not on file    Non-medical: Not on file  Tobacco Use  . Smoking status: Light Tobacco Smoker    Packs/day: 0.25    Years: 20.00    Pack years: 5.00    Types: Cigarettes  . Smokeless tobacco: Never Used  Substance and Sexual Activity  . Alcohol use: No    Comment: occasional, once a month  . Drug use: No  . Sexual activity: Yes    Birth control/protection: Surgical  Lifestyle  . Physical activity:    Days per week: Not on file  Minutes per session: Not on file  . Stress: Not on file  Relationships  . Social connections:    Talks on phone: Not on file    Gets together: Not on file    Attends religious service: Not on file    Active member of club or organization: Not on file    Attends meetings of clubs or organizations: Not on file    Relationship status: Not on file  Other Topics Concern  . Not on file  Social History Narrative  . Not on file     Review of Systems: A 12 point ROS discussed and pertinent positives are indicated in the HPI above.  All other systems are negative.  Review of Systems  Vital Signs: BP 115/81 (BP Location: Right Arm)   Pulse 81   Temp 98 F (36.7 C) (Oral)   Ht 5\' 7"  (1.702 m)   Wt 209 lb (94.8 kg)   SpO2 100%   BMI 32.73 kg/m   Physical Exam  Constitutional: She is oriented to person, place, and time. She appears well-developed. No distress.  HENT:  Head: Normocephalic.  Mouth/Throat: Oropharynx is clear and moist.  Neck: Normal range of motion. No JVD present.    Cardiovascular: Normal rate, regular rhythm and normal heart sounds.  Pulmonary/Chest: Effort normal and breath sounds normal. No respiratory distress.  Abdominal: Soft. She exhibits no mass. There is no tenderness.  Neurological: She is alert and oriented to person, place, and time.  Skin: Skin is warm and dry.  Psychiatric: She has a normal mood and affect.      Imaging: No results found.  Labs:  CBC: Recent Labs    07/30/17 1605 09/07/17 1254 04/20/18 1129  WBC 9.6 10.1 4.4  HGB 12.6 13.0 12.4  HCT 38.4 39.5 39.0  PLT 284 250 236    COAGS: Recent Labs    09/07/17 1254 04/20/18 1129  INR 1.02 0.99  APTT 27 25    BMP: Recent Labs    07/30/17 1605 09/07/17 1254  NA 139 139  K 3.7 3.3*  CL 108 108  CO2 23 25  GLUCOSE 101* 83  BUN 18 13  CALCIUM 8.8* 8.8*  CREATININE 0.71 0.94  GFRNONAA >60 >60  GFRAA >60 >60    LIVER FUNCTION TESTS: Recent Labs    09/07/17 1254  BILITOT 0.4  AST 68*  ALT 102*  ALKPHOS 268*  PROT 5.9*  ALBUMIN 3.5    TUMOR MARKERS: No results for input(s): AFPTM, CEA, CA199, CHROMGRNA in the last 8760 hours.  Assessment and Plan: Elevated LFTs For US guided random liver biopsy Labs pending Risks and benefits discussed with the patient including, but not limited to bleeding, infection, damage to adjacent structures or low yield requiring additional tests.  All of the patient's questions were answered, patient is agreeable to proceed. Consent signed and in chart.    Thank you for this interesting consult.  I greatly enjoyed meeting Jordan Russell and look forward to participating in their care.  A copy of this report was sent to the requesting provider on this date.  Electronically Signed: Ascencion Dike, PA-C 04/20/2018, 12:07 PM   I spent a total of 20 minutes in face to face in clinical consultation, greater than 50% of which was counseling/coordinating care for liver biopsy

## 2018-04-20 NOTE — Sedation Documentation (Signed)
Patient is resting comfortably. 

## 2018-05-22 ENCOUNTER — Emergency Department (HOSPITAL_BASED_OUTPATIENT_CLINIC_OR_DEPARTMENT_OTHER): Payer: 59

## 2018-05-22 ENCOUNTER — Other Ambulatory Visit: Payer: Self-pay

## 2018-05-22 ENCOUNTER — Encounter (HOSPITAL_BASED_OUTPATIENT_CLINIC_OR_DEPARTMENT_OTHER): Payer: Self-pay

## 2018-05-22 ENCOUNTER — Emergency Department (HOSPITAL_BASED_OUTPATIENT_CLINIC_OR_DEPARTMENT_OTHER)
Admission: EM | Admit: 2018-05-22 | Discharge: 2018-05-22 | Disposition: A | Discharge status: Home / Self Care | Payer: 59 | Attending: Emergency Medicine | Admitting: Emergency Medicine

## 2018-05-22 DIAGNOSIS — R2241 Localized swelling, mass and lump, right lower limb: Secondary | ICD-10-CM | POA: Diagnosis present

## 2018-05-22 DIAGNOSIS — E876 Hypokalemia: Secondary | ICD-10-CM | POA: Insufficient documentation

## 2018-05-22 DIAGNOSIS — Y939 Activity, unspecified: Secondary | ICD-10-CM | POA: Insufficient documentation

## 2018-05-22 DIAGNOSIS — Y999 Unspecified external cause status: Secondary | ICD-10-CM | POA: Insufficient documentation

## 2018-05-22 DIAGNOSIS — X58XXXA Exposure to other specified factors, initial encounter: Secondary | ICD-10-CM | POA: Insufficient documentation

## 2018-05-22 DIAGNOSIS — F17210 Nicotine dependence, cigarettes, uncomplicated: Secondary | ICD-10-CM | POA: Insufficient documentation

## 2018-05-22 DIAGNOSIS — T148XXA Other injury of unspecified body region, initial encounter: Secondary | ICD-10-CM

## 2018-05-22 DIAGNOSIS — R072 Precordial pain: Secondary | ICD-10-CM | POA: Insufficient documentation

## 2018-05-22 DIAGNOSIS — M79604 Pain in right leg: Secondary | ICD-10-CM | POA: Insufficient documentation

## 2018-05-22 DIAGNOSIS — J45909 Unspecified asthma, uncomplicated: Secondary | ICD-10-CM | POA: Diagnosis not present

## 2018-05-22 DIAGNOSIS — S8011XA Contusion of right lower leg, initial encounter: Secondary | ICD-10-CM | POA: Insufficient documentation

## 2018-05-22 DIAGNOSIS — Y929 Unspecified place or not applicable: Secondary | ICD-10-CM | POA: Insufficient documentation

## 2018-05-22 DIAGNOSIS — Z79899 Other long term (current) drug therapy: Secondary | ICD-10-CM | POA: Insufficient documentation

## 2018-05-22 HISTORY — DX: Gastroparesis: K31.84

## 2018-05-22 LAB — D-DIMER, QUANTITATIVE: D-Dimer, Quant: 0.63 ug/mL-FEU — ABNORMAL HIGH (ref 0.00–0.50)

## 2018-05-22 LAB — CBC WITH DIFFERENTIAL/PLATELET
Basophils Absolute: 0 10*3/uL (ref 0.0–0.1)
Basophils Relative: 0 %
EOS ABS: 0.1 10*3/uL (ref 0.0–0.7)
EOS PCT: 1 %
HCT: 37.3 % (ref 36.0–46.0)
Hemoglobin: 12.4 g/dL (ref 12.0–15.0)
LYMPHS PCT: 15 %
Lymphs Abs: 1.3 10*3/uL (ref 0.7–4.0)
MCH: 31.5 pg (ref 26.0–34.0)
MCHC: 33.2 g/dL (ref 30.0–36.0)
MCV: 94.7 fL (ref 78.0–100.0)
MONO ABS: 0.8 10*3/uL (ref 0.1–1.0)
Monocytes Relative: 9 %
Neutro Abs: 6.5 10*3/uL (ref 1.7–7.7)
Neutrophils Relative %: 75 %
PLATELETS: 252 10*3/uL (ref 150–400)
RBC: 3.94 MIL/uL (ref 3.87–5.11)
RDW: 14.9 % (ref 11.5–15.5)
WBC: 8.7 10*3/uL (ref 4.0–10.5)

## 2018-05-22 LAB — COMPREHENSIVE METABOLIC PANEL
ALK PHOS: 290 U/L — AB (ref 38–126)
ALT: 53 U/L — AB (ref 0–44)
AST: 52 U/L — AB (ref 15–41)
Albumin: 2.2 g/dL — ABNORMAL LOW (ref 3.5–5.0)
Anion gap: 5 (ref 5–15)
BUN: 13 mg/dL (ref 6–20)
CO2: 23 mmol/L (ref 22–32)
CREATININE: 0.66 mg/dL (ref 0.44–1.00)
Calcium: 7.5 mg/dL — ABNORMAL LOW (ref 8.9–10.3)
Chloride: 109 mmol/L (ref 98–111)
GFR calc Af Amer: >60 mL/min (ref >60)
GLUCOSE: 76 mg/dL (ref 70–99)
POTASSIUM: 2.9 mmol/L — AB (ref 3.5–5.1)
Sodium: 137 mmol/L (ref 135–145)
TOTAL PROTEIN: 4.1 g/dL — AB (ref 6.5–8.1)
Total Bilirubin: 0.5 mg/dL (ref 0.3–1.2)

## 2018-05-22 LAB — PROTIME-INR
INR: 1.01
PROTHROMBIN TIME: 13.2 s (ref 11.4–15.2)

## 2018-05-22 LAB — TROPONIN I

## 2018-05-22 MED ORDER — POTASSIUM CHLORIDE CRYS ER 20 MEQ PO TBCR: 20.0000 meq | EXTENDED_RELEASE_TABLET | Freq: Two times a day (BID) | ORAL | 0 refills | Status: DC

## 2018-05-22 MED ORDER — TRAMADOL HCL 50 MG PO TABS: 50.0000 mg | ORAL_TABLET | Freq: Four times a day (QID) | ORAL | 0 refills | Status: DC | PRN

## 2018-05-22 MED ORDER — IOPAMIDOL (ISOVUE-370) INJECTION 76%
100.0000 mL | Freq: Once | INTRAVENOUS | Status: AC | PRN
  Administered 2018-05-22: 100 mL via INTRAVENOUS

## 2018-05-22 MED ORDER — FENTANYL CITRATE (PF) 100 MCG/2ML IJ SOLN
50.0000 ug | Freq: Once | INTRAMUSCULAR | Status: AC
  Administered 2018-05-22: 50 ug via INTRAVENOUS
  Filled 2018-05-22: qty 2

## 2018-05-22 NOTE — Discharge Instructions (Signed)
You were seen in the ED today with leg pain, bruising, and chest pain. Your labs and tests today were normal. I am providing some potassium for the next several days. Follow up with you PCP and keep your appointment with the cardiologist. Return to the ED with any sudden worsening symptoms.

## 2018-05-22 NOTE — ED Notes (Signed)
Patient transported to CT 

## 2018-05-22 NOTE — ED Provider Notes (Signed)
[facility] EMERGENCY DEPARTMENT Provider Note   CSN: [id number] Arrival date & time: 05/22/18  1228     History   Chief Complaint Chief Complaint  Patient presents with  . Leg Swelling    HPI Jordan Russell is a 47 y.o. female.  The history is provided by the patient. No language interpreter was used.   Jordan Russell is a 47 y.o. female who presents to the Emergency Department complaining of leg swelling. She reports swelling to bilateral lower extremities, right greater than left for the last two weeks. Last night she developed ecchymosis to the right leg with pain to the leg and ankle. Pain is constant but worse with standing. She also endorses intermittent chest pain with dizziness over the last several months. She denies any fevers, cough, vomiting. She does have poor appetite. She is currently being treated with an royal impact on L4 rheumatoid arthritis. She also has been followed for elevated liver enzymes and had a liver biopsy performed a few weeks ago. She has frequent falls attributable to her RA and history of ECT. She does not take any anticoagulants. No history of DVT or PE. No hormone use. Past Medical History:  Diagnosis Date  . Asthma   . Bipolar 1 disorder ([place])   . Fibromyalgia   . GAD (generalized anxiety disorder)   . Gastroparesis   . GERD (gastroesophageal reflux disease)   . Hiatal hernia   . History of kidney stones   . History of primary hyperparathyroidism    s/p  bilateral inferior parathyroidectomy 08-03-2010  . IBS (irritable bowel syndrome)   . Left ureteral stone   . Migraine   . PONV (postoperative nausea and vomiting)    and claustrophobic with mask  . RA (rheumatoid arthritis) ([place])    dr Gavin Pound-- rheumtologist  . Self mutilating behavior   . Thyroid disease   . Urgency of urination   . Wears glasses     Patient Active Problem List   Diagnosis Date Noted  . Trochanteric bursitis, left hip 09/08/2017  .  Pain in left hip 09/08/2017  . Asthma with acute exacerbation 12/17/2016  . Carpal tunnel syndrome of left wrist 12/17/2016  . Dysphagia, pharyngoesophageal phase 12/17/2016  . Fibromyalgia 12/17/2016  . H/O vitamin D deficiency 12/17/2016  . Hidradenitis suppurativa 12/17/2016  . Calculus of kidney 12/17/2016  . Hypoglycemia 12/17/2016  . Hyperglycemia 12/17/2016  . Hydronephrosis with renal and ureteral calculus obstruction 12/17/2016  . Hydradenitis 12/17/2016  . Hx of iron deficiency anemia 12/17/2016  . HPTH (hyperparathyroidism) ([place]) 12/17/2016  . Nocturia 12/17/2016  . Intractable migraine with aura without status migrainosus 12/17/2016  . Pain in left knee 12/17/2016  . Arthralgia of multiple joints 12/17/2016  . Avitaminosis D 12/17/2016  . Anemia, iron deficiency 12/17/2016  . Cutaneous eruption 12/17/2016  . Pain in joint 12/17/2016  . Varicose veins of both legs with edema 06/28/2016  . Adverse effects of medication 03/10/2016  . Bipolar disorder ([place]) 03/10/2016  . Rheumatoid arthritis ([place]) 03/10/2016  . Migraine 03/10/2016  . IBS (irritable bowel syndrome) 03/10/2016  . Anxiety 03/10/2016  . Asthma in adult 03/10/2016  . GERD (gastroesophageal reflux disease) 03/10/2016  . Epigastric pain 06/04/2015  . Pharyngoesophageal dysphagia 02/10/2015  . S/P right knee arthroscopy 04/16/2014  . Chondromalacia of knee 02/26/2014  . Knee pain 02/20/2014  . Bilateral knee pain 02/05/2014  . Vitamin D deficiency 12/14/2013  . S/P gastric bypass 12/14/2013    Past  Surgical History:  Procedure Laterality Date  . ABDOMINAL HYSTERECTOMY    . BALLOON DILATION N/A 10/01/2014   Procedure: BALLOON DILATION;  Surgeon: Garlan Fair, MD;  Location: [facility];  Service: Endoscopy;  Laterality: N/A;  . CYSTO/  BILATERAL RETROGRADE PYELOGRAM  11/20/2000  . CYSTO/  RIGHT RETROGRADE PYELOGRAM/  RIGHT URETEROSCOPY/  STENT PLACEMENT  12/16/2006  . CYSTOSCOPY/URETEROSCOPY/HOLMIUM  LASER/STENT PLACEMENT Left 11/09/2016   Procedure: CYSTOSCOPY/URETEROSCOPY/HOLMIUM LASER/STENT PLACEMENT;  Surgeon: Nickie Retort, MD;  Location[facility];  Service: Urology;  Laterality: Left;  90 MINS  [phone]   . ESOPHAGEAL MANOMETRY N/A 12/23/2014   Procedure: ESOPHAGEAL MANOMETRY (EM);  Surgeon: Garlan Fair, MD;  Location: [facility];  Service: Endoscopy;  Laterality: N/A;  . ESOPHAGOGASTRODUODENOSCOPY  last one 03-28-2015  . ESOPHAGOGASTRODUODENOSCOPY (EGD) WITH PROPOFOL N/A 10/01/2014   Procedure: ESOPHAGOGASTRODUODENOSCOPY (EGD) WITH PROPOFOL;  Surgeon: Garlan Fair, MD;  Location: [facility];  Service: Endoscopy;  Laterality: N/A;  . EXTRACORPOREAL SHOCK WAVE LITHOTRIPSY  multiple times since age 28  . HOLMIUM LASER APPLICATION Left 06/09/3809   Procedure: HOLMIUM LASER APPLICATION;  Surgeon: Nickie Retort, MD;  Location: [facility];  Service: Urology;  Laterality: Left;  . KNEE ARTHROSCOPY Right 03-04-2014  [facility]   w/ Arthrotomy  . LAPAROSCOPIC ASSISTED VAGINAL HYSTERECTOMY  12/28/2005  . LAPAROSCOPIC CHOLECYSTECTOMY  01/17/2004  . LAPAROSCOPY LEFT OVARIAN CYSTECTOMY/  BILATERAL TUBAL LIGATION  02/26/2003  . LEFT URETEROSCOPIC STONE EXTRACTION /  STENT PLACEMENT  07/09/2002  . NECK EXPLORATION/  BILATERAL INFERIOR PARATHYROIDECTOMY  08/03/2010  . RIGHT URETERAL DILATION/  URETEROSCOPIC STONE EXTRACTION  06/12/2010  . ROUX-EN-Y GASTRIC BYPASS  2001  . SOLYX TRANSURETHRAL SLING/  POSTERIOR PELVIC FLOOR SACROSPINOUS REPAIR  02/21/2009   and Cysto/  Bilateral ureteral stent placement  . TONSILLECTOMY AND ADENOIDECTOMY    . WRIST GANGLION EXCISION Right 01/28/2000     OB History   None      Home Medications    Prior to Admission medications   Medication Sig Start Date End Date Taking? Authorizing Provider  acetaminophen (TYLENOL) 500 MG tablet Take 1,000 mg by mouth every 6 (six) hours as needed for headache.      [provider]  albuterol (PROVENTIL) (2.5 MG/3ML) 0.083% nebulizer solution Take 3 mLs (2.5 mg total) by nebulization every 4 (four) hours as needed for wheezing or shortness of breath. 09/20/14   Malvin Johns, MD  ALPRAZolam Duanne Moron) 1 MG tablet Take 1 mg by mouth 4 (four) times daily as needed for anxiety.     [provider]  buPROPion (WELLBUTRIN SR) 100 MG 12 hr tablet Take 100 mg by mouth 3 (three) times daily. AM, LUNCH, 1600     [provider]  cyanocobalamin (,VITAMIN B-12,) 1000 MCG/ML injection Inject 1,000 mcg every 30 (thirty) days into the muscle. 07/28/17   [provider]  cyclobenzaprine (FLEXERIL) 5 MG tablet Take 5 mg by mouth 3 (three) times daily as needed for muscle spasms.    [provider]  etanercept (ENBREL) 50 MG/ML injection Inject 50 mg into the skin once a week. friday's    [provider]  hydrocortisone (ANUSOL-HC) 25 MG suppository Place 25 mg rectally 2 (two) times daily as needed for hemorrhoids or anal itching.    [provider]  hydroxychloroquine (PLAQUENIL) 200 MG tablet Take 200 mg by mouth 2 (two) times daily.    [provider]  LATUDA 80 MG TABS tablet Take 80  mg by mouth at bedtime.  08/27/17   [provider]  levothyroxine (SYNTHROID, LEVOTHROID) 50 MCG tablet Take 50 mcg by mouth daily before breakfast.    [provider]  omeprazole (PRILOSEC) 20 MG capsule Take 20 mg by mouth 2 (two) times daily before a meal.    [provider]  promethazine (PHENERGAN) 25 MG tablet Take 25 mg by mouth every 6 (six) hours as needed for nausea or vomiting.    [provider]  QUEtiapine (SEROQUEL) 300 MG tablet Take 600 mg by mouth at bedtime.    [provider]  sodium chloride (OCEAN) 0.65 % SOLN nasal spray Place 1 spray into both nostrils as needed for congestion.    [provider]  topiramate (TOPAMAX) 25 MG capsule Take 25 mg by mouth  daily as needed (migraines).  12/10/14   [provider]    Family History Family History  Problem Relation Age of Onset  . Bipolar disorder Son   . Breast cancer Mother   . Breast cancer Sister   . Anxiety disorder Brother   . Depression Brother   . Breast cancer Maternal Aunt   . Breast cancer Maternal Grandmother     Social History Social History   Tobacco Use  . Smoking status: Current Every Day Smoker    Packs/day: 0.25    Years: 20.00    Pack years: 5.00    Types: Cigarettes  . Smokeless tobacco: Never Used  Substance Use Topics  . Alcohol use: Yes    Comment: occ  . Drug use: No     Allergies   Lamictal [lamotrigine]; Penicillins; Sulfa antibiotics; and Morphine and related   Review of Systems Review of Systems  All other systems reviewed and are negative.    Physical Exam Updated Vital Signs BP (!) 102/59 (BP Location: Right Arm)   Pulse 99   Temp 98.1 F (36.7 C) (Oral)   Resp 18   Ht 5\' 7"  (1.702 m)   Wt 96.2 kg (212 lb)   SpO2 98%   BMI 33.20 kg/m   Physical Exam  Constitutional: She is oriented to person, place, and time. She appears well-developed and well-nourished.  HENT:  Head: Normocephalic and atraumatic.  Cardiovascular: Regular rhythm.  No murmur heard. Tachycardic  Pulmonary/Chest: Effort normal and breath sounds normal. No respiratory distress.  Abdominal: Soft. There is no tenderness. There is no rebound and no guarding.  Musculoskeletal:  2+ DP pulses bilaterally. There is ecchymosis to the right calf and shin with tenderness to palpation throughout the right ankle and tibia. No erythema. There is 3 to 4+ pitting edema to the right lower extremity with 2 to 3+ pitting edema to the left lower extremity. Areas of ecchymosis to all four extremities.  Neurological: She is alert and oriented to person, place, and time.  Skin: Skin is warm and dry.  Psychiatric: She has a normal mood and affect. Her behavior is normal.    Nursing note and vitals reviewed.    ED Treatments / Results  Labs (all labs ordered are listed, but only abnormal results are displayed) Labs Reviewed  COMPREHENSIVE METABOLIC PANEL  CBC WITH DIFFERENTIAL/PLATELET  TROPONIN I  D-DIMER, QUANTITATIVE (NOT AT [facility])  PROTIME-INR    EKG None  Radiology No results found.  Procedures Procedures (including critical care time)  Medications Ordered in ED Medications - No data to display   Initial Impression / Assessment and Plan / ED Course  I have reviewed  the triage vital signs and the nursing notes.  Pertinent labs & imaging results that were available during my care of the patient were reviewed by me and considered in my medical decision making (see chart for details).     Patient here for evaluation of progressive bilateral lower extremity edema with ecchymosis to the right lower extremity. Patient appears to have recent trauma to the leg although she denies any. Plan to check plain films as well as vascular study to rule out DVT. No current evidence of cellulitis. Presentation is not consistent with congestive heart failure. Given her areas of ecchymosis as well as lower extremity edema plan to check labs for renal/liver disease/myelopathy. Patient care transferred pending imaging and labs.  Final Clinical Impressions(s) / ED Diagnoses   Final diagnoses:  None    ED Discharge Orders    None       Quintella Reichert, MD 05/22/18 1517

## 2018-05-22 NOTE — ED Triage Notes (Signed)
Pt with hx of bilat leg swelling-worse x 2 week-states she has appt with MD vascular MD next month-also c/o "purple rash" to bilat LE started yesterday--to triage in w/c

## 2018-05-22 NOTE — ED Provider Notes (Signed)
Blood pressure 100/60, pulse 85, temperature 98.1 F (36.7 C), temperature source Oral, resp. rate 17, height 5\' 7"  (1.702 m), weight 96.2 kg (212 lb), SpO2 98 %.  Assuming care from Dr. Ayesha Rumpf.  In short, Jordan Russell is a 47 y.o. female with a chief complaint of Leg Swelling .  Refer to the original H&P for additional details.  The current plan of care is to f/u on labs and imaging and reassess.  Labs and imaging reviewed. No acute findings. Plan for discharge with pain medication and close PCP and Rheumatology follow up as an outpatient.   At this time, I do not feel there is any life-threatening condition present. I have reviewed and discussed all results (EKG, imaging, lab, urine as appropriate), exam findings with patient. I have reviewed nursing notes and appropriate previous records.  I feel the patient is safe to be discharged home without further emergent workup. Discussed usual and customary return precautions. Patient and family (if present) verbalize understanding and are comfortable with this plan.  Patient will follow-up with their primary care provider. If they do not have a primary care provider, information for follow-up has been provided to them. All questions have been answered.  Nanda Quinton, MD   Margette Fast, MD 05/23/18 [phone]

## 2018-05-31 ENCOUNTER — Other Ambulatory Visit: Payer: Self-pay

## 2018-05-31 ENCOUNTER — Emergency Department (HOSPITAL_COMMUNITY)
Admission: EM | Admit: 2018-05-31 | Discharge: 2018-05-31 | Disposition: A | Discharge status: Home / Self Care | Payer: Managed Care, Other (non HMO) | Attending: Emergency Medicine | Admitting: Emergency Medicine

## 2018-05-31 ENCOUNTER — Encounter (HOSPITAL_COMMUNITY): Payer: Self-pay

## 2018-05-31 ENCOUNTER — Emergency Department (HOSPITAL_COMMUNITY): Payer: Managed Care, Other (non HMO)

## 2018-05-31 DIAGNOSIS — Y92003 Bedroom of unspecified non-institutional (private) residence as the place of occurrence of the external cause: Secondary | ICD-10-CM | POA: Insufficient documentation

## 2018-05-31 DIAGNOSIS — W06XXXA Fall from bed, initial encounter: Secondary | ICD-10-CM | POA: Diagnosis not present

## 2018-05-31 DIAGNOSIS — R531 Weakness: Secondary | ICD-10-CM

## 2018-05-31 DIAGNOSIS — J45909 Unspecified asthma, uncomplicated: Secondary | ICD-10-CM | POA: Diagnosis not present

## 2018-05-31 DIAGNOSIS — Y998 Other external cause status: Secondary | ICD-10-CM | POA: Diagnosis not present

## 2018-05-31 DIAGNOSIS — Y939 Activity, unspecified: Secondary | ICD-10-CM | POA: Insufficient documentation

## 2018-05-31 DIAGNOSIS — S0990XA Unspecified injury of head, initial encounter: Secondary | ICD-10-CM | POA: Diagnosis present

## 2018-05-31 DIAGNOSIS — F17210 Nicotine dependence, cigarettes, uncomplicated: Secondary | ICD-10-CM | POA: Diagnosis not present

## 2018-05-31 LAB — URINALYSIS, ROUTINE W REFLEX MICROSCOPIC
BACTERIA UA: NONE SEEN
Glucose, UA: NEGATIVE mg/dL
Hgb urine dipstick: NEGATIVE
Ketones, ur: NEGATIVE mg/dL
NITRITE: NEGATIVE
PROTEIN: NEGATIVE mg/dL
SPECIFIC GRAVITY, URINE: 1.012 (ref 1.005–1.030)
pH: 6 (ref 5.0–8.0)

## 2018-05-31 LAB — BASIC METABOLIC PANEL
Anion gap: 9 (ref 5–15)
BUN: 10 mg/dL (ref 6–20)
CALCIUM: 8.2 mg/dL — AB (ref 8.9–10.3)
CO2: 25 mmol/L (ref 22–32)
Chloride: 103 mmol/L (ref 98–111)
Creatinine, Ser: 0.56 mg/dL (ref 0.44–1.00)
GFR calc non Af Amer: >60 mL/min (ref >60)
GLUCOSE: 88 mg/dL (ref 70–99)
Potassium: 3.3 mmol/L — ABNORMAL LOW (ref 3.5–5.1)
SODIUM: 137 mmol/L (ref 135–145)

## 2018-05-31 LAB — CBC WITH DIFFERENTIAL/PLATELET
BASOS PCT: 0 %
Basophils Absolute: 0 10*3/uL (ref 0.0–0.1)
Eosinophils Absolute: 0.1 10*3/uL (ref 0.0–0.7)
Eosinophils Relative: 1 %
HCT: 36.3 % (ref 36.0–46.0)
Hemoglobin: 12.6 g/dL (ref 12.0–15.0)
Lymphocytes Relative: 12 %
Lymphs Abs: 1.2 10*3/uL (ref 0.7–4.0)
MCH: 31.7 pg (ref 26.0–34.0)
MCHC: 34.7 g/dL (ref 30.0–36.0)
MCV: 91.2 fL (ref 78.0–100.0)
MONO ABS: 1 10*3/uL (ref 0.1–1.0)
Monocytes Relative: 10 %
NEUTROS PCT: 77 %
Neutro Abs: 7.7 10*3/uL (ref 1.7–7.7)
PLATELETS: 225 10*3/uL (ref 150–400)
RBC: 3.98 MIL/uL (ref 3.87–5.11)
RDW: 16.3 % — AB (ref 11.5–15.5)
WBC: 10 10*3/uL (ref 4.0–10.5)

## 2018-05-31 MED ORDER — SODIUM CHLORIDE 0.9 % IV BOLUS
500.0000 mL | Freq: Once | INTRAVENOUS | Status: AC
  Administered 2018-05-31: 500 mL via INTRAVENOUS

## 2018-05-31 NOTE — ED Triage Notes (Signed)
Per EMS, pt felt weak getting out of bed and fell this morning. Pt slid out of her bed and on to floor. Pt thinks this is d/t her bipolar medication. Pt hit head, did not lose consciousness. Pt was able to ambulate w/ EMS. Pt also has redness and swelling to right lower leg for the past 2 weeks.   BP 126/74 HR 88 O2 100 RR 16

## 2018-05-31 NOTE — ED Notes (Signed)
Per MD: Pt may have something to drink. Pt provided with water

## 2018-05-31 NOTE — ED Provider Notes (Signed)
Emergency Department Provider Note   I have reviewed the triage vital signs and the nursing notes.   HISTORY  Chief Complaint Fall   HPI Jordan Russell is a 47 y.o. female with PMH of bipolar disorder, fibromyalgia, GERD, IBS, primary hyperparathyroidism, and gastroparesis presents to the emergency department for evaluation after likely mechanical fall this morning.  The patient states she was getting out of bed when she slipped and fell striking her head on the way down.  She denies loss of consciousness.  She states she has been feeling fatigued prior to falling.  She denies any chest pain or dyspnea.  No palpitations.  Denies any fevers or chills.  The patient states that she continues to have right leg redness over the past several weeks.  She has had extensive work-up for this including DVT evaluation which was normal.  She was seen in the emergency department last week at which point she had a CT angio which was also normal.  She has been taking potassium supplementation but states that she sometimes vomits up the pills because of her gastroparesis. She is currently followed by both psychiatry and neurology for ongoing symptoms and notes a recent outpatient MRI with Neurology which is reported by her as normal. She states in the past her psychiatry meds were too strong and gave similar symptoms. She plans to discuss this with her psychiatrist this week.    Past Medical History:  Diagnosis Date  . Asthma   . Bipolar 1 disorder ([place])   . Fibromyalgia   . GAD (generalized anxiety disorder)   . Gastroparesis   . GERD (gastroesophageal reflux disease)   . Hiatal hernia   . History of kidney stones   . History of primary hyperparathyroidism    s/p  bilateral inferior parathyroidectomy 08-03-2010  . IBS (irritable bowel syndrome)   . Left ureteral stone   . Migraine   . PONV (postoperative nausea and vomiting)    and claustrophobic with mask  . RA (rheumatoid arthritis) ([place])     dr Gavin Pound-- rheumtologist  . Self mutilating behavior   . Thyroid disease   . Urgency of urination   . Wears glasses     Patient Active Problem List   Diagnosis Date Noted  . Trochanteric bursitis, left hip 09/08/2017  . Pain in left hip 09/08/2017  . Asthma with acute exacerbation 12/17/2016  . Carpal tunnel syndrome of left wrist 12/17/2016  . Dysphagia, pharyngoesophageal phase 12/17/2016  . Fibromyalgia 12/17/2016  . H/O vitamin D deficiency 12/17/2016  . Hidradenitis suppurativa 12/17/2016  . Calculus of kidney 12/17/2016  . Hypoglycemia 12/17/2016  . Hyperglycemia 12/17/2016  . Hydronephrosis with renal and ureteral calculus obstruction 12/17/2016  . Hydradenitis 12/17/2016  . Hx of iron deficiency anemia 12/17/2016  . HPTH (hyperparathyroidism) ([place]) 12/17/2016  . Nocturia 12/17/2016  . Intractable migraine with aura without status migrainosus 12/17/2016  . Pain in left knee 12/17/2016  . Arthralgia of multiple joints 12/17/2016  . Avitaminosis D 12/17/2016  . Anemia, iron deficiency 12/17/2016  . Cutaneous eruption 12/17/2016  . Pain in joint 12/17/2016  . Varicose veins of both legs with edema 06/28/2016  . Adverse effects of medication 03/10/2016  . Bipolar disorder ([place]) 03/10/2016  . Rheumatoid arthritis ([place]) 03/10/2016  . Migraine 03/10/2016  . IBS (irritable bowel syndrome) 03/10/2016  . Anxiety 03/10/2016  . Asthma in adult 03/10/2016  . GERD (gastroesophageal reflux disease) 03/10/2016  . Epigastric pain 06/04/2015  . Pharyngoesophageal dysphagia  02/10/2015  . S/P right knee arthroscopy 04/16/2014  . Chondromalacia of knee 02/26/2014  . Knee pain 02/20/2014  . Bilateral knee pain 02/05/2014  . Vitamin D deficiency 12/14/2013  . S/P gastric bypass 12/14/2013    Past Surgical History:  Procedure Laterality Date  . ABDOMINAL HYSTERECTOMY    . BALLOON DILATION N/A 10/01/2014   Procedure: BALLOON DILATION;  Surgeon: Garlan Fair, MD;   Location: [facility];  Service: Endoscopy;  Laterality: N/A;  . CYSTO/  BILATERAL RETROGRADE PYELOGRAM  11/20/2000  . CYSTO/  RIGHT RETROGRADE PYELOGRAM/  RIGHT URETEROSCOPY/  STENT PLACEMENT  12/16/2006  . CYSTOSCOPY/URETEROSCOPY/HOLMIUM LASER/STENT PLACEMENT Left 11/09/2016   Procedure: CYSTOSCOPY/URETEROSCOPY/HOLMIUM LASER/STENT PLACEMENT;  Surgeon: Nickie Retort, MD;  Location: [facility];  Service: Urology;  Laterality: Left;  90 MINS  [phone]   . ESOPHAGEAL MANOMETRY N/A 12/23/2014   Procedure: ESOPHAGEAL MANOMETRY (EM);  Surgeon: Garlan Fair, MD;  Location: [facility];  Service: Endoscopy;  Laterality: N/A;  . ESOPHAGOGASTRODUODENOSCOPY  last one 03-28-2015  . ESOPHAGOGASTRODUODENOSCOPY (EGD) WITH PROPOFOL N/A 10/01/2014   Procedure: ESOPHAGOGASTRODUODENOSCOPY (EGD) WITH PROPOFOL;  Surgeon: Garlan Fair, MD;  Location: [facility];  Service: Endoscopy;  Laterality: N/A;  . EXTRACORPOREAL SHOCK WAVE LITHOTRIPSY  multiple times since age 79  . HOLMIUM LASER APPLICATION Left 02/02/9674   Procedure: HOLMIUM LASER APPLICATION;  Surgeon: Nickie Retort, MD;  Location: [facility];  Service: Urology;  Laterality: Left;  . KNEE ARTHROSCOPY Right 03-04-2014  [facility]   w/ Arthrotomy  . LAPAROSCOPIC ASSISTED VAGINAL HYSTERECTOMY  12/28/2005  . LAPAROSCOPIC CHOLECYSTECTOMY  01/17/2004  . LAPAROSCOPY LEFT OVARIAN CYSTECTOMY/  BILATERAL TUBAL LIGATION  02/26/2003  . LEFT URETEROSCOPIC STONE EXTRACTION /  STENT PLACEMENT  07/09/2002  . NECK EXPLORATION/  BILATERAL INFERIOR PARATHYROIDECTOMY  08/03/2010  . RIGHT URETERAL DILATION/  URETEROSCOPIC STONE EXTRACTION  06/12/2010  . ROUX-EN-Y GASTRIC BYPASS  2001  . SOLYX TRANSURETHRAL SLING/  POSTERIOR PELVIC FLOOR SACROSPINOUS REPAIR  02/21/2009   and Cysto/  Bilateral ureteral stent placement  . TONSILLECTOMY AND ADENOIDECTOMY    . WRIST GANGLION EXCISION Right 01/28/2000     Allergies Sulfasalazine; Lamictal [lamotrigine]; Penicillins; Sulfa antibiotics; and Morphine and related  Family History  Problem Relation Age of Onset  . Bipolar disorder Son   . Breast cancer Mother   . Breast cancer Sister   . Anxiety disorder Brother   . Depression Brother   . Breast cancer Maternal Aunt   . Breast cancer Maternal Grandmother     Social History Social History   Tobacco Use  . Smoking status: Current Every Day Smoker    Packs/day: 0.25    Years: 20.00    Pack years: 5.00    Types: Cigarettes  . Smokeless tobacco: Never Used  Substance Use Topics  . Alcohol use: Yes    Comment: occ  . Drug use: No    Review of Systems  Constitutional: No fever/chills. Positive fatigue.  Eyes: No visual changes. ENT: No sore throat. Cardiovascular: Denies chest pain. Respiratory: Denies shortness of breath. Gastrointestinal: No abdominal pain.  No nausea, no vomiting.  No diarrhea.  No constipation. Genitourinary: Negative for dysuria. Musculoskeletal: Negative for back pain. Skin: Positive right leg redness and B/L LE edema.  Neurological: Negative for focal weakness or numbness. Positive HA.   10-point ROS otherwise negative.  ____________________________________________   PHYSICAL EXAM:  VITAL SIGNS: ED Triage Vitals  Enc Vitals Group     BP 05/31/18 0936 124/77  Pulse Rate 05/31/18 0936 (!) 102     Resp 05/31/18 0936 17     Temp 05/31/18 0936 97.6 F (36.4 C)     Temp Source 05/31/18 0936 Oral     SpO2 05/31/18 0936 98 %     Weight 05/31/18 0944 209 lb (94.8 kg)     Pain Score 05/31/18 0943 3   Constitutional: Alert and oriented. Well appearing and in no acute distress. Eyes: Conjunctivae are normal. PERRL. EOMI. Head: Atraumatic. Nose: No congestion/rhinnorhea. Mouth/Throat: Mucous membranes are moist.  Oropharynx non-erythematous. Neck: No stridor. No cervical spine tenderness to palpation. Cardiovascular: Normal rate, regular  rhythm. Good peripheral circulation. Grossly normal heart sounds.   Respiratory: Normal respiratory effort.  No retractions. Lungs CTAB. Gastrointestinal: Soft and nontender. No distention.  Musculoskeletal: No lower extremity tenderness. Positive B/L LE edema (pitting) and non-blanching rash over the right leg rash. No gross deformities of extremities. Neurologic:  Normal speech and language. No gross focal neurologic deficits are appreciated.  Skin:  Skin is warm, dry and intact. No rash noted.  ____________________________________________   LABS (all labs ordered are listed, but only abnormal results are displayed)  Labs Reviewed  BASIC METABOLIC PANEL - Abnormal; Notable for the following components:      Result Value   Potassium 3.3 (*)    Calcium 8.2 (*)    All other components within normal limits  CBC WITH DIFFERENTIAL/PLATELET - Abnormal; Notable for the following components:   RDW 16.3 (*)    All other components within normal limits  URINALYSIS, ROUTINE W REFLEX MICROSCOPIC - Abnormal; Notable for the following components:   Color, Urine AMBER (*)    Bilirubin Urine SMALL (*)    Leukocytes, UA SMALL (*)    All other components within normal limits   ____________________________________________  EKG   EKG Interpretation  Date/Time:  Wednesday May 31 2018 10:41:16 EDT Ventricular Rate:  97 PR Interval:    QRS Duration: 94 QT Interval:  361 QTC Calculation: 459 R Axis:   35 Text Interpretation:  Sinus rhythm Nonspecific T abnormalities, lateral leads No STEMI.  Confirmed by Nanda Quinton [phone]) on 05/31/2018 10:52:29 AM       ____________________________________________  RADIOLOGY  Ct Head Wo Contrast  Result Date: 05/31/2018 CLINICAL DATA:  Minor head trauma.  Fall. EXAM: CT HEAD WITHOUT CONTRAST TECHNIQUE: Contiguous axial images were obtained from the base of the skull through the vertex without intravenous contrast. COMPARISON:  MRI head 09/07/2017, CT  09/07/2017 FINDINGS: Brain: No evidence of acute infarction, hemorrhage, hydrocephalus, extra-axial collection or mass lesion/mass effect. Vascular: No hyperdense vessel or unexpected calcification. Skull: Negative for skull lesion.  Scaphocephaly. Sinuses/Orbits: Negative Other: None IMPRESSION: Negative CT head. Electronically Signed   By: Franchot Gallo M.D.   On: 05/31/2018 10:35    ____________________________________________   PROCEDURES  Procedure(s) performed:   Procedures  None ____________________________________________   INITIAL IMPRESSION / ASSESSMENT AND PLAN / ED COURSE  Pertinent labs & imaging results that were available during my care of the patient were reviewed by me and considered in my medical decision making (see chart for details).  Patient presents to the emergency department for evaluation after mechanical fall with head trauma.  There is no outward sign of head trauma but she is experiencing headache.  Plan for CT imaging of the head.  Patient also has various other more chronic complaints for which she is following with psychiatry and neurology.  She has had extensive outpatient work-up for these  issues in the recent past and has close follow-up with these providers.  Her blood work from last ED visit did show hypokalemia.  Plan for repeat labs to assess this and r/o anemia.   Patient CT imaging and labs reviewed. No acute findings. No UTI. Patient has re-scheduled for her psych MD today to review med list and consider possible side effects causing more chronic symptoms. Plan to continue outpatient Neurology follow up.   At this time, I do not feel there is any life-threatening condition present. I have reviewed and discussed all results (EKG, imaging, lab, urine as appropriate), exam findings with patient. I have reviewed nursing notes and appropriate previous records.  I feel the patient is safe to be discharged home without further emergent workup. Discussed  usual and customary return precautions. Patient and family (if present) verbalize understanding and are comfortable with this plan.  Patient will follow-up with their primary care provider. If they do not have a primary care provider, information for follow-up has been provided to them. All questions have been answered.  ____________________________________________  FINAL CLINICAL IMPRESSION(S) / ED DIAGNOSES  Final diagnoses:  Injury of head, initial encounter  Generalized weakness     MEDICATIONS GIVEN DURING THIS VISIT:  Medications  sodium chloride 0.9 % bolus 500 mL (0 mLs Intravenous Stopped 05/31/18 1108)    Note:  This document was prepared using Dragon voice recognition software and may include unintentional dictation errors.  Nanda Quinton, MD Emergency Medicine    Amaurie Wandel, [facility], MD 05/31/18 1600

## 2018-05-31 NOTE — ED Notes (Signed)
Family at bedside. 

## 2018-05-31 NOTE — ED Notes (Signed)
Bed: WA14 Expected date:  Expected time:  Means of arrival:  Comments: EMS-fall 

## 2018-05-31 NOTE — ED Notes (Addendum)
Pt assisted up to bedside toilet. Pt required minimal assistance. Urine noted to be tea colored.  Pt reports that her urine has been "very dark" recently

## 2018-05-31 NOTE — ED Notes (Signed)
Pt unable to void at this time. Pt was placed on purewick upon arrival

## 2018-05-31 NOTE — ED Notes (Signed)
Patient transported to CT 

## 2018-05-31 NOTE — Discharge Instructions (Signed)
You were seen in the Emergency Department (ED) today for a head injury.  Based on your evaluation, you may have sustained a concussion (or bruise) to your brain.  If you had a CT scan done, it did not show any evidence of serious injury or bleeding.    Symptoms to expect from a concussion include nausea, mild to moderate headache, difficulty concentrating or sleeping, and mild lightheadedness.  These symptoms should improve over the next few days to weeks, but it may take many weeks before you feel back to normal.  Return to the emergency department or follow-up with your primary care doctor if your symptoms are not improving over this time.  Signs of a more serious head injury include vomiting, severe headache, excessive sleepiness or confusion, and weakness or numbness in your face, arms or legs.  Return immediately to the Emergency Department if you experience any of these more concerning symptoms.    Rest, avoid strenuous physical or mental activity, and avoid activities that could potentially result in another head injury until all your symptoms from this head injury are completely resolved for at least 2-3 weeks.  You may take ibuprofen or acetaminophen over the counter according to label instructions for mild headache or scalp soreness.  

## 2018-06-04 ENCOUNTER — Inpatient Hospital Stay (HOSPITAL_COMMUNITY)
Admission: EM | Admit: 2018-06-04 | Discharge: 2018-06-09 | DRG: 871 | Disposition: A | Discharge status: Other Acute Inpatient Hospital | Payer: 59 | Attending: Internal Medicine | Admitting: Internal Medicine

## 2018-06-04 ENCOUNTER — Other Ambulatory Visit: Payer: Self-pay

## 2018-06-04 ENCOUNTER — Emergency Department (HOSPITAL_COMMUNITY): Payer: 59

## 2018-06-04 ENCOUNTER — Encounter (HOSPITAL_COMMUNITY): Payer: Self-pay | Admitting: *Deleted

## 2018-06-04 DIAGNOSIS — R748 Abnormal levels of other serum enzymes: Secondary | ICD-10-CM | POA: Diagnosis not present

## 2018-06-04 DIAGNOSIS — K719 Toxic liver disease, unspecified: Secondary | ICD-10-CM

## 2018-06-04 DIAGNOSIS — Z791 Long term (current) use of non-steroidal anti-inflammatories (NSAID): Secondary | ICD-10-CM

## 2018-06-04 DIAGNOSIS — Z880 Allergy status to penicillin: Secondary | ICD-10-CM

## 2018-06-04 DIAGNOSIS — K7682 Hepatic encephalopathy: Secondary | ICD-10-CM

## 2018-06-04 DIAGNOSIS — E43 Unspecified severe protein-calorie malnutrition: Secondary | ICD-10-CM | POA: Diagnosis present

## 2018-06-04 DIAGNOSIS — N179 Acute kidney failure, unspecified: Secondary | ICD-10-CM

## 2018-06-04 DIAGNOSIS — F319 Bipolar disorder, unspecified: Secondary | ICD-10-CM | POA: Diagnosis present

## 2018-06-04 DIAGNOSIS — J811 Chronic pulmonary edema: Secondary | ICD-10-CM | POA: Diagnosis present

## 2018-06-04 DIAGNOSIS — X58XXXA Exposure to other specified factors, initial encounter: Secondary | ICD-10-CM | POA: Diagnosis present

## 2018-06-04 DIAGNOSIS — R4701 Aphasia: Secondary | ICD-10-CM | POA: Diagnosis present

## 2018-06-04 DIAGNOSIS — R6520 Severe sepsis without septic shock: Secondary | ICD-10-CM | POA: Diagnosis present

## 2018-06-04 DIAGNOSIS — K7290 Hepatic failure, unspecified without coma: Secondary | ICD-10-CM | POA: Diagnosis not present

## 2018-06-04 DIAGNOSIS — F3130 Bipolar disorder, current episode depressed, mild or moderate severity, unspecified: Secondary | ICD-10-CM

## 2018-06-04 DIAGNOSIS — R4182 Altered mental status, unspecified: Secondary | ICD-10-CM | POA: Diagnosis not present

## 2018-06-04 DIAGNOSIS — E7220 Disorder of urea cycle metabolism, unspecified: Secondary | ICD-10-CM | POA: Diagnosis not present

## 2018-06-04 DIAGNOSIS — R7881 Bacteremia: Secondary | ICD-10-CM | POA: Diagnosis not present

## 2018-06-04 DIAGNOSIS — R471 Dysarthria and anarthria: Secondary | ICD-10-CM | POA: Diagnosis present

## 2018-06-04 DIAGNOSIS — Z87442 Personal history of urinary calculi: Secondary | ICD-10-CM

## 2018-06-04 DIAGNOSIS — E669 Obesity, unspecified: Secondary | ICD-10-CM | POA: Diagnosis present

## 2018-06-04 DIAGNOSIS — G9341 Metabolic encephalopathy: Secondary | ICD-10-CM | POA: Diagnosis present

## 2018-06-04 DIAGNOSIS — R7989 Other specified abnormal findings of blood chemistry: Secondary | ICD-10-CM

## 2018-06-04 DIAGNOSIS — B9620 Unspecified Escherichia coli [E. coli] as the cause of diseases classified elsewhere: Secondary | ICD-10-CM

## 2018-06-04 DIAGNOSIS — F419 Anxiety disorder, unspecified: Secondary | ICD-10-CM | POA: Diagnosis not present

## 2018-06-04 DIAGNOSIS — R410 Disorientation, unspecified: Secondary | ICD-10-CM | POA: Diagnosis not present

## 2018-06-04 DIAGNOSIS — A419 Sepsis, unspecified organism: Secondary | ICD-10-CM

## 2018-06-04 DIAGNOSIS — K760 Fatty (change of) liver, not elsewhere classified: Secondary | ICD-10-CM | POA: Diagnosis present

## 2018-06-04 DIAGNOSIS — Z4659 Encounter for fitting and adjustment of other gastrointestinal appliance and device: Secondary | ICD-10-CM

## 2018-06-04 DIAGNOSIS — R6510 Systemic inflammatory response syndrome (SIRS) of non-infectious origin without acute organ dysfunction: Secondary | ICD-10-CM

## 2018-06-04 DIAGNOSIS — M069 Rheumatoid arthritis, unspecified: Secondary | ICD-10-CM | POA: Diagnosis present

## 2018-06-04 DIAGNOSIS — J189 Pneumonia, unspecified organism: Secondary | ICD-10-CM

## 2018-06-04 DIAGNOSIS — M1990 Unspecified osteoarthritis, unspecified site: Secondary | ICD-10-CM | POA: Diagnosis present

## 2018-06-04 DIAGNOSIS — Z9181 History of falling: Secondary | ICD-10-CM

## 2018-06-04 DIAGNOSIS — R440 Auditory hallucinations: Secondary | ICD-10-CM | POA: Diagnosis present

## 2018-06-04 DIAGNOSIS — J45909 Unspecified asthma, uncomplicated: Secondary | ICD-10-CM | POA: Diagnosis present

## 2018-06-04 DIAGNOSIS — K529 Noninfective gastroenteritis and colitis, unspecified: Secondary | ICD-10-CM

## 2018-06-04 DIAGNOSIS — R17 Unspecified jaundice: Secondary | ICD-10-CM

## 2018-06-04 DIAGNOSIS — T378X5A Adverse effect of other specified systemic anti-infectives and antiparasitics, initial encounter: Secondary | ICD-10-CM | POA: Diagnosis not present

## 2018-06-04 DIAGNOSIS — R0902 Hypoxemia: Secondary | ICD-10-CM | POA: Diagnosis not present

## 2018-06-04 DIAGNOSIS — S40022A Contusion of left upper arm, initial encounter: Secondary | ICD-10-CM | POA: Diagnosis present

## 2018-06-04 DIAGNOSIS — R262 Difficulty in walking, not elsewhere classified: Secondary | ICD-10-CM | POA: Diagnosis present

## 2018-06-04 DIAGNOSIS — K589 Irritable bowel syndrome without diarrhea: Secondary | ICD-10-CM | POA: Diagnosis present

## 2018-06-04 DIAGNOSIS — T394X5A Adverse effect of antirheumatics, not elsewhere classified, initial encounter: Secondary | ICD-10-CM | POA: Diagnosis not present

## 2018-06-04 DIAGNOSIS — Z01810 Encounter for preprocedural cardiovascular examination: Secondary | ICD-10-CM

## 2018-06-04 DIAGNOSIS — R945 Abnormal results of liver function studies: Secondary | ICD-10-CM

## 2018-06-04 DIAGNOSIS — D72829 Elevated white blood cell count, unspecified: Secondary | ICD-10-CM

## 2018-06-04 DIAGNOSIS — Y95 Nosocomial condition: Secondary | ICD-10-CM | POA: Diagnosis present

## 2018-06-04 DIAGNOSIS — E162 Hypoglycemia, unspecified: Secondary | ICD-10-CM | POA: Diagnosis present

## 2018-06-04 DIAGNOSIS — Z9049 Acquired absence of other specified parts of digestive tract: Secondary | ICD-10-CM

## 2018-06-04 DIAGNOSIS — S40021A Contusion of right upper arm, initial encounter: Secondary | ICD-10-CM | POA: Diagnosis present

## 2018-06-04 DIAGNOSIS — R29810 Facial weakness: Secondary | ICD-10-CM | POA: Diagnosis present

## 2018-06-04 DIAGNOSIS — E8809 Other disorders of plasma-protein metabolism, not elsewhere classified: Secondary | ICD-10-CM | POA: Diagnosis present

## 2018-06-04 DIAGNOSIS — E210 Primary hyperparathyroidism: Secondary | ICD-10-CM | POA: Diagnosis present

## 2018-06-04 DIAGNOSIS — Z818 Family history of other mental and behavioral disorders: Secondary | ICD-10-CM

## 2018-06-04 DIAGNOSIS — K7110 Toxic liver disease with hepatic necrosis, without coma: Secondary | ICD-10-CM | POA: Diagnosis not present

## 2018-06-04 DIAGNOSIS — Z882 Allergy status to sulfonamides status: Secondary | ICD-10-CM

## 2018-06-04 DIAGNOSIS — S8012XA Contusion of left lower leg, initial encounter: Secondary | ICD-10-CM | POA: Diagnosis present

## 2018-06-04 DIAGNOSIS — Z888 Allergy status to other drugs, medicaments and biological substances status: Secondary | ICD-10-CM

## 2018-06-04 DIAGNOSIS — Z6836 Body mass index (BMI) 36.0-36.9, adult: Secondary | ICD-10-CM

## 2018-06-04 DIAGNOSIS — K219 Gastro-esophageal reflux disease without esophagitis: Secondary | ICD-10-CM | POA: Diagnosis present

## 2018-06-04 DIAGNOSIS — Z79899 Other long term (current) drug therapy: Secondary | ICD-10-CM

## 2018-06-04 DIAGNOSIS — F411 Generalized anxiety disorder: Secondary | ICD-10-CM | POA: Diagnosis present

## 2018-06-04 DIAGNOSIS — Z515 Encounter for palliative care: Secondary | ICD-10-CM

## 2018-06-04 DIAGNOSIS — F17210 Nicotine dependence, cigarettes, uncomplicated: Secondary | ICD-10-CM | POA: Diagnosis present

## 2018-06-04 DIAGNOSIS — R1310 Dysphagia, unspecified: Secondary | ICD-10-CM

## 2018-06-04 DIAGNOSIS — K7200 Acute and subacute hepatic failure without coma: Secondary | ICD-10-CM | POA: Diagnosis present

## 2018-06-04 DIAGNOSIS — S8011XA Contusion of right lower leg, initial encounter: Secondary | ICD-10-CM | POA: Diagnosis present

## 2018-06-04 DIAGNOSIS — D689 Coagulation defect, unspecified: Secondary | ICD-10-CM | POA: Diagnosis present

## 2018-06-04 DIAGNOSIS — I959 Hypotension, unspecified: Secondary | ICD-10-CM | POA: Diagnosis present

## 2018-06-04 DIAGNOSIS — M797 Fibromyalgia: Secondary | ICD-10-CM | POA: Diagnosis present

## 2018-06-04 DIAGNOSIS — R601 Generalized edema: Secondary | ICD-10-CM | POA: Diagnosis present

## 2018-06-04 DIAGNOSIS — R5381 Other malaise: Secondary | ICD-10-CM | POA: Diagnosis present

## 2018-06-04 DIAGNOSIS — Z90710 Acquired absence of both cervix and uterus: Secondary | ICD-10-CM

## 2018-06-04 DIAGNOSIS — K3184 Gastroparesis: Secondary | ICD-10-CM | POA: Diagnosis present

## 2018-06-04 DIAGNOSIS — A4151 Sepsis due to Escherichia coli [E. coli]: Secondary | ICD-10-CM | POA: Diagnosis not present

## 2018-06-04 DIAGNOSIS — G9340 Encephalopathy, unspecified: Secondary | ICD-10-CM | POA: Diagnosis present

## 2018-06-04 DIAGNOSIS — Z885 Allergy status to narcotic agent status: Secondary | ICD-10-CM

## 2018-06-04 DIAGNOSIS — Z9884 Bariatric surgery status: Secondary | ICD-10-CM

## 2018-06-04 DIAGNOSIS — R600 Localized edema: Secondary | ICD-10-CM | POA: Diagnosis present

## 2018-06-04 DIAGNOSIS — L299 Pruritus, unspecified: Secondary | ICD-10-CM | POA: Diagnosis present

## 2018-06-04 HISTORY — DX: Sepsis, unspecified organism: R65.20

## 2018-06-04 HISTORY — DX: Sepsis, unspecified organism: A41.9

## 2018-06-04 LAB — COMPREHENSIVE METABOLIC PANEL
ALT: 91 U/L — ABNORMAL HIGH (ref 0–44)
AST: 91 U/L — AB (ref 15–41)
Albumin: 1.9 g/dL — ABNORMAL LOW (ref 3.5–5.0)
Alkaline Phosphatase: 540 U/L — ABNORMAL HIGH (ref 38–126)
Anion gap: 15 (ref 5–15)
BUN: 18 mg/dL (ref 6–20)
CO2: 19 mmol/L — AB (ref 22–32)
Calcium: 8 mg/dL — ABNORMAL LOW (ref 8.9–10.3)
Chloride: 98 mmol/L (ref 98–111)
Creatinine, Ser: 1.25 mg/dL — ABNORMAL HIGH (ref 0.44–1.00)
GFR, EST AFRICAN AMERICAN: 58 mL/min — AB (ref >60)
GFR, EST NON AFRICAN AMERICAN: 50 mL/min — AB (ref >60)
Glucose, Bld: 59 mg/dL — ABNORMAL LOW (ref 70–99)
POTASSIUM: 4.2 mmol/L (ref 3.5–5.1)
SODIUM: 132 mmol/L — AB (ref 135–145)
Total Bilirubin: 14.3 mg/dL — ABNORMAL HIGH (ref 0.3–1.2)
Total Protein: 4.3 g/dL — ABNORMAL LOW (ref 6.5–8.1)

## 2018-06-04 LAB — URINALYSIS, ROUTINE W REFLEX MICROSCOPIC

## 2018-06-04 LAB — CBG MONITORING, ED
Glucose-Capillary: 110 mg/dL — ABNORMAL HIGH (ref 70–99)
Glucose-Capillary: 72 mg/dL (ref 70–99)

## 2018-06-04 LAB — RAPID URINE DRUG SCREEN, HOSP PERFORMED
Amphetamines: NOT DETECTED
Barbiturates: NOT DETECTED
Benzodiazepines: POSITIVE — AB
Cocaine: NOT DETECTED
Opiates: NOT DETECTED
Tetrahydrocannabinol: NOT DETECTED

## 2018-06-04 LAB — URINALYSIS, MICROSCOPIC (REFLEX)

## 2018-06-04 LAB — I-STAT CHEM 8, ED
BUN: 18 mg/dL (ref 6–20)
Calcium, Ion: 1.02 mmol/L — ABNORMAL LOW (ref 1.15–1.40)
Chloride: 101 mmol/L (ref 98–111)
Creatinine, Ser: 1.5 mg/dL — ABNORMAL HIGH (ref 0.44–1.00)
Glucose, Bld: 50 mg/dL — ABNORMAL LOW (ref 70–99)
HEMATOCRIT: 36 % (ref 36.0–46.0)
Hemoglobin: 12.2 g/dL (ref 12.0–15.0)
POTASSIUM: 4.2 mmol/L (ref 3.5–5.1)
SODIUM: 133 mmol/L — AB (ref 135–145)
TCO2: 20 mmol/L — ABNORMAL LOW (ref 22–32)

## 2018-06-04 LAB — I-STAT CG4 LACTIC ACID, ED
LACTIC ACID, VENOUS: 6.17 mmol/L — AB (ref 0.5–1.9)
LACTIC ACID, VENOUS: 6.23 mmol/L — AB (ref 0.5–1.9)

## 2018-06-04 LAB — DIC (DISSEMINATED INTRAVASCULAR COAGULATION)PANEL
INR: 1.3
Platelets: 158 10*3/uL (ref 150–400)
Prothrombin Time: 16.1 seconds — ABNORMAL HIGH (ref 11.4–15.2)
aPTT: 30 seconds (ref 24–36)

## 2018-06-04 LAB — DIC (DISSEMINATED INTRAVASCULAR COAGULATION) PANEL
D DIMER QUANT: 2.3 ug{FEU}/mL — AB (ref 0.00–0.50)
FIBRINOGEN: 473 mg/dL (ref 210–475)
SMEAR REVIEW: NONE SEEN

## 2018-06-04 LAB — PROCALCITONIN: Procalcitonin: 23.93 ng/mL

## 2018-06-04 LAB — ACETAMINOPHEN LEVEL

## 2018-06-04 LAB — AMMONIA: AMMONIA: 28 umol/L (ref 9–35)

## 2018-06-04 LAB — ETHANOL

## 2018-06-04 LAB — LACTATE DEHYDROGENASE: LDH: 247 U/L — AB (ref 98–192)

## 2018-06-04 LAB — LIPASE, BLOOD: Lipase: 20 U/L (ref 11–51)

## 2018-06-04 LAB — LACTIC ACID, PLASMA: Lactic Acid, Venous: 5.1 mmol/L (ref 0.5–1.9)

## 2018-06-04 LAB — TROPONIN I
TROPONIN I: 0.04 ng/mL — AB (ref <0.03)
TROPONIN I: 0.04 ng/mL — AB (ref <0.03)

## 2018-06-04 LAB — BILIRUBIN, DIRECT: Bilirubin, Direct: 8.9 mg/dL — ABNORMAL HIGH (ref 0.0–0.2)

## 2018-06-04 MED ORDER — VANCOMYCIN HCL IN DEXTROSE 1-5 GM/200ML-% IV SOLN
1000.0000 mg | INTRAVENOUS | Status: DC
  Filled 2018-06-04: qty 200

## 2018-06-04 MED ORDER — IOPAMIDOL (ISOVUE-300) INJECTION 61%
INTRAVENOUS | Status: AC
  Filled 2018-06-04: qty 100

## 2018-06-04 MED ORDER — DEXTROSE 50 % IV SOLN
1.0000 | Freq: Once | INTRAVENOUS | Status: AC
  Administered 2018-06-04: 50 mL via INTRAVENOUS
  Filled 2018-06-04: qty 50

## 2018-06-04 MED ORDER — VANCOMYCIN HCL 10 G IV SOLR
1500.0000 mg | INTRAVENOUS | Status: AC
  Administered 2018-06-04: 1500 mg via INTRAVENOUS
  Filled 2018-06-04: qty 1500

## 2018-06-04 MED ORDER — ONDANSETRON HCL 4 MG PO TABS: 4.0000 mg | ORAL_TABLET | Freq: Four times a day (QID) | ORAL | Status: DC | PRN

## 2018-06-04 MED ORDER — PANTOPRAZOLE SODIUM 40 MG PO PACK: 40.0000 mg | PACK | Freq: Every day | ORAL | Status: DC

## 2018-06-04 MED ORDER — FAMOTIDINE IN NACL 20-0.9 MG/50ML-% IV SOLN
20.0000 mg | Freq: Once | INTRAVENOUS | Status: AC
  Administered 2018-06-04: 20 mg via INTRAVENOUS
  Filled 2018-06-04: qty 50

## 2018-06-04 MED ORDER — LEVOFLOXACIN IN D5W 750 MG/150ML IV SOLN
750.0000 mg | INTRAVENOUS | Status: DC
  Filled 2018-06-04: qty 150

## 2018-06-04 MED ORDER — SODIUM CHLORIDE 0.9 % IV BOLUS (SEPSIS)
1000.0000 mL | Freq: Once | INTRAVENOUS | Status: AC
  Administered 2018-06-04: 1000 mL via INTRAVENOUS

## 2018-06-04 MED ORDER — SODIUM CHLORIDE 0.9 % IV SOLN
2.0000 g | Freq: Three times a day (TID) | INTRAVENOUS | Status: DC
  Administered 2018-06-05 – 2018-06-07 (×8): 2 g via INTRAVENOUS
  Filled 2018-06-04 (×10): qty 2

## 2018-06-04 MED ORDER — ENOXAPARIN SODIUM 40 MG/0.4ML ~~LOC~~ SOLN
40.0000 mg | Freq: Every day | SUBCUTANEOUS | Status: DC
  Administered 2018-06-05 – 2018-06-07 (×4): 40 mg via SUBCUTANEOUS
  Filled 2018-06-04 (×5): qty 0.4

## 2018-06-04 MED ORDER — IOPAMIDOL (ISOVUE-300) INJECTION 61%
100.0000 mL | Freq: Once | INTRAVENOUS | Status: AC | PRN
  Administered 2018-06-04: 100 mL via INTRAVENOUS

## 2018-06-04 MED ORDER — ALBUMIN HUMAN 25 % IV SOLN
12.5000 g | Freq: Once | INTRAVENOUS | Status: AC
  Administered 2018-06-04: 12.5 g via INTRAVENOUS
  Filled 2018-06-04: qty 50

## 2018-06-04 MED ORDER — SODIUM CHLORIDE 0.9 % IV BOLUS
1000.0000 mL | Freq: Once | INTRAVENOUS | Status: AC
  Administered 2018-06-04: 1000 mL via INTRAVENOUS

## 2018-06-04 MED ORDER — ALBUTEROL SULFATE (2.5 MG/3ML) 0.083% IN NEBU: 2.5000 mg | INHALATION_SOLUTION | RESPIRATORY_TRACT | Status: DC | PRN

## 2018-06-04 MED ORDER — SODIUM CHLORIDE 0.9 % IV BOLUS
844.0000 mL | Freq: Once | INTRAVENOUS | Status: AC
  Administered 2018-06-04: 844 mL via INTRAVENOUS

## 2018-06-04 MED ORDER — ALBUTEROL SULFATE (2.5 MG/3ML) 0.083% IN NEBU: 2.5000 mg | INHALATION_SOLUTION | Freq: Four times a day (QID) | RESPIRATORY_TRACT | Status: DC

## 2018-06-04 MED ORDER — ONDANSETRON HCL 4 MG/2ML IJ SOLN
4.0000 mg | Freq: Four times a day (QID) | INTRAMUSCULAR | Status: DC | PRN
Start: 2018-06-04 — End: 2018-06-09
  Administered 2018-06-05 – 2018-06-08 (×6): 4 mg via INTRAVENOUS
  Filled 2018-06-04 (×6): qty 2

## 2018-06-04 MED ORDER — LEVOFLOXACIN IN D5W 750 MG/150ML IV SOLN
750.0000 mg | INTRAVENOUS | Status: AC
  Administered 2018-06-04: 750 mg via INTRAVENOUS
  Filled 2018-06-04: qty 150

## 2018-06-04 MED ORDER — SODIUM CHLORIDE 0.9 % IV SOLN
2.0000 g | INTRAVENOUS | Status: AC
  Administered 2018-06-04: 2 g via INTRAVENOUS
  Filled 2018-06-04: qty 2

## 2018-06-04 NOTE — ED Triage Notes (Addendum)
Pt unable to walk for about 2 weeks symptoms worsening, slurred speech, ? Medication reaction. 3rd ED visit with same symptoms. Pt becoming more lethargic in triage, swelling to feet and legs, bp  72/76

## 2018-06-04 NOTE — ED Provider Notes (Signed)
[facility] DEPT Provider Note   CSN: [id number] Arrival date & time: 06/04/18  1453     History   Chief Complaint Chief Complaint  Patient presents with  . Difficulty Walking  . Aphasia  . auditory hallicunations    HPI Jordan Russell is a 47 y.o. female.  HPI  Patient is a 47 year old female with a history of IBS, bipolar 1 disorder, asthma, primary hyperparathyroidism status post parathyroidectomy, and surgical history significant for Roux-en-Y bypass, cholecystectomy, and abdominal hysterectomy presenting for weakness, particularly in the lower extremities, slurred speech, and right facial droop.  Patient presents with her husband who assists in history taking.  Patient and her husband report a several month decline of weakness resulting in multiple falls over the past 3 to 6 months.  Patient reports that acutely over the past 48 hours, she developed more right-sided facial droop without any other focal weakness in her body.  Patient's husband is noting that her speech while can be slurred at times over the past couple months, appears more slurred today.  They did not notice that she was more yellowed in color, but do note that she has been followed by gastroenterology at [facility] physicians for elevated liver function.  Patient had biopsy of her liver in June 2019, which was negative result.  Patient denies any fevers, chills, chest pain, shortness of breath, or coughing.  Patient does report nausea without vomiting over this past week.  Patient denies diarrhea or change in stools, with no melena or hematochezia.  Patient does note that she has been bruising more easily over the past several months.  Patient denies any abdominal tenderness, urinary urgency, frequency, or dysuria.  Patient reports lower extremity edema, worse on the right over the past several weeks, for which she was previously evaluated in July 2019.  Recent travel includes a cruise to  [place], [place], in [place] in June 2019.  Patient denies any history of heavy alcohol use, heavy Tylenol use, or illicit drug use.  Past Medical History:  Diagnosis Date  . Asthma   . Bipolar 1 disorder ([place])   . Fibromyalgia   . GAD (generalized anxiety disorder)   . Gastroparesis   . GERD (gastroesophageal reflux disease)   . Hiatal hernia   . History of kidney stones   . History of primary hyperparathyroidism    s/p  bilateral inferior parathyroidectomy 08-03-2010  . IBS (irritable bowel syndrome)   . Left ureteral stone   . Migraine   . PONV (postoperative nausea and vomiting)    and claustrophobic with mask  . RA (rheumatoid arthritis) ([place])    dr Gavin Pound-- rheumtologist  . Self mutilating behavior   . Thyroid disease   . Urgency of urination   . Wears glasses     Patient Active Problem List   Diagnosis Date Noted  . Trochanteric bursitis, left hip 09/08/2017  . Pain in left hip 09/08/2017  . Asthma with acute exacerbation 12/17/2016  . Carpal tunnel syndrome of left wrist 12/17/2016  . Dysphagia, pharyngoesophageal phase 12/17/2016  . Fibromyalgia 12/17/2016  . H/O vitamin D deficiency 12/17/2016  . Hidradenitis suppurativa 12/17/2016  . Calculus of kidney 12/17/2016  . Hypoglycemia 12/17/2016  . Hyperglycemia 12/17/2016  . Hydronephrosis with renal and ureteral calculus obstruction 12/17/2016  . Hydradenitis 12/17/2016  . Hx of iron deficiency anemia 12/17/2016  . HPTH (hyperparathyroidism) ([place]) 12/17/2016  . Nocturia 12/17/2016  . Intractable migraine with aura without status migrainosus 12/17/2016  .  Pain in left knee 12/17/2016  . Arthralgia of multiple joints 12/17/2016  . Avitaminosis D 12/17/2016  . Anemia, iron deficiency 12/17/2016  . Cutaneous eruption 12/17/2016  . Pain in joint 12/17/2016  . Varicose veins of both legs with edema 06/28/2016  . Adverse effects of medication 03/10/2016  . Bipolar disorder ([place]) 03/10/2016  . Rheumatoid  arthritis ([place]) 03/10/2016  . Migraine 03/10/2016  . IBS (irritable bowel syndrome) 03/10/2016  . Anxiety 03/10/2016  . Asthma in adult 03/10/2016  . GERD (gastroesophageal reflux disease) 03/10/2016  . Epigastric pain 06/04/2015  . Pharyngoesophageal dysphagia 02/10/2015  . S/P right knee arthroscopy 04/16/2014  . Chondromalacia of knee 02/26/2014  . Knee pain 02/20/2014  . Bilateral knee pain 02/05/2014  . Vitamin D deficiency 12/14/2013  . S/P gastric bypass 12/14/2013    Past Surgical History:  Procedure Laterality Date  . ABDOMINAL HYSTERECTOMY    . BALLOON DILATION N/A 10/01/2014   Procedure: BALLOON DILATION;  Surgeon: Garlan Fair, MD;  Location: [facility];  Service: Endoscopy;  Laterality: N/A;  . CYSTO/  BILATERAL RETROGRADE PYELOGRAM  11/20/2000  . CYSTO/  RIGHT RETROGRADE PYELOGRAM/  RIGHT URETEROSCOPY/  STENT PLACEMENT  12/16/2006  . CYSTOSCOPY/URETEROSCOPY/HOLMIUM LASER/STENT PLACEMENT Left 11/09/2016   Procedure: CYSTOSCOPY/URETEROSCOPY/HOLMIUM LASER/STENT PLACEMENT;  Surgeon: Nickie Retort, MD;  Location: [facility];  Service: Urology;  Laterality: Left;  90 MINS  [id number]   . ESOPHAGEAL MANOMETRY N/A 12/23/2014   Procedure: ESOPHAGEAL MANOMETRY (EM);  Surgeon: Garlan Fair, MD;  Location: [facility];  Service: Endoscopy;  Laterality: N/A;  . ESOPHAGOGASTRODUODENOSCOPY  last one 03-28-2015  . ESOPHAGOGASTRODUODENOSCOPY (EGD) WITH PROPOFOL N/A 10/01/2014   Procedure: ESOPHAGOGASTRODUODENOSCOPY (EGD) WITH PROPOFOL;  Surgeon: Garlan Fair, MD;  Location: [facility];  Service: Endoscopy;  Laterality: N/A;  . EXTRACORPOREAL SHOCK WAVE LITHOTRIPSY  multiple times since age 77  . HOLMIUM LASER APPLICATION Left 02/08/4495   Procedure: HOLMIUM LASER APPLICATION;  Surgeon: Nickie Retort, MD;  Location[facility];  Service: Urology;  Laterality: Left;  . KNEE ARTHROSCOPY Right 03-04-2014  [facility]   w/ Arthrotomy  .  LAPAROSCOPIC ASSISTED VAGINAL HYSTERECTOMY  12/28/2005  . LAPAROSCOPIC CHOLECYSTECTOMY  01/17/2004  . LAPAROSCOPY LEFT OVARIAN CYSTECTOMY/  BILATERAL TUBAL LIGATION  02/26/2003  . LEFT URETEROSCOPIC STONE EXTRACTION /  STENT PLACEMENT  07/09/2002  . NECK EXPLORATION/  BILATERAL INFERIOR PARATHYROIDECTOMY  08/03/2010  . RIGHT URETERAL DILATION/  URETEROSCOPIC STONE EXTRACTION  06/12/2010  . ROUX-EN-Y GASTRIC BYPASS  2001  . SOLYX TRANSURETHRAL SLING/  POSTERIOR PELVIC FLOOR SACROSPINOUS REPAIR  02/21/2009   and Cysto/  Bilateral ureteral stent placement  . TONSILLECTOMY AND ADENOIDECTOMY    . WRIST GANGLION EXCISION Right 01/28/2000     OB History   None      Home Medications    Prior to Admission medications   Medication Sig Start Date End Date Taking? Authorizing Provider  acetaminophen (TYLENOL) 500 MG tablet Take 1,000 mg by mouth every 6 (six) hours as needed for headache.    Yes [provider]  albuterol (PROVENTIL) (2.5 MG/3ML) 0.083% nebulizer solution Take 3 mLs (2.5 mg total) by nebulization every 4 (four) hours as needed for wheezing or shortness of breath. 09/20/14  Yes Malvin Johns, MD  ALPRAZolam Duanne Moron) 1 MG tablet Take 1 mg by mouth 4 (four) times daily as needed for anxiety.    Yes [provider]  buPROPion (WELLBUTRIN SR) 100 MG 12 hr tablet Take 100 mg by mouth  3 (three) times daily. AM, LUNCH, 1600    Yes [provider]  cetirizine (ZYRTEC) 10 MG tablet Take 10 mg by mouth daily.   Yes [provider]  cyanocobalamin (,VITAMIN B-12,) 1000 MCG/ML injection Inject 1,000 mcg every 30 (thirty) days into the muscle. 07/28/17  Yes [provider]  etanercept (ENBREL) 50 MG/ML injection Inject 50 mg into the skin once a week. friday's   Yes [provider]  hydroxychloroquine (PLAQUENIL) 200 MG tablet Take 200 mg by mouth 2 (two) times daily.   Yes [provider]  LATUDA 80 MG TABS tablet Take 80 mg by  mouth at bedtime.  08/27/17  Yes [provider]  levothyroxine (SYNTHROID, LEVOTHROID) 50 MCG tablet Take 50 mcg by mouth daily before breakfast.   Yes [provider]  lubiprostone (AMITIZA) 24 MCG capsule Take 24 mcg by mouth 2 (two) times daily with a meal.   Yes [provider]  naproxen (NAPROSYN) 500 MG tablet Take 500 mg by mouth 2 (two) times daily with a meal.   Yes [provider]  omeprazole (PRILOSEC) 20 MG capsule Take 20 mg by mouth 2 (two) times daily before a meal.   Yes [provider]  promethazine (PHENERGAN) 25 MG tablet Take 25 mg by mouth every 6 (six) hours as needed for nausea or vomiting.   Yes [provider]  QUEtiapine (SEROQUEL) 300 MG tablet Take 600 mg by mouth at bedtime.   Yes [provider]  sodium chloride (OCEAN) 0.65 % SOLN nasal spray Place 1 spray into both nostrils as needed for congestion.   Yes [provider]  topiramate (TOPAMAX) 25 MG capsule Take 50 mg by mouth 2 (two) times daily.  12/10/14  Yes [provider]  potassium chloride SA (K-DUR,KLOR-CON) 20 MEQ tablet Take 1 tablet (20 mEq total) by mouth 2 (two) times daily for 4 days. 05/22/18 05/26/18  Long, Wonda Olds, MD    Family History Family History  Problem Relation Age of Onset  . Bipolar disorder Son   . Breast cancer Mother   . Breast cancer Sister   . Anxiety disorder Brother   . Depression Brother   . Breast cancer Maternal Aunt   . Breast cancer Maternal Grandmother     Social History Social History   Tobacco Use  . Smoking status: Current Every Day Smoker    Packs/day: 0.25    Years: 20.00    Pack years: 5.00    Types: Cigarettes  . Smokeless tobacco: Never Used  Substance Use Topics  . Alcohol use: Yes    Comment: occ  . Drug use: No     Allergies   Sulfasalazine; Lamictal [lamotrigine]; Penicillins; Sulfa antibiotics; and Morphine and related   Review of Systems Review of Systems    Constitutional: Negative for chills and fever.  HENT: Negative for congestion and sore throat.   Eyes: Negative for visual disturbance.  Respiratory: Negative for cough, chest tightness and shortness of breath.   Cardiovascular: Positive for leg swelling. Negative for chest pain and palpitations.  Gastrointestinal: Positive for nausea. Negative for abdominal pain, constipation, diarrhea and vomiting.  Genitourinary: Negative for dysuria and flank pain.  Musculoskeletal: Negative for back pain and myalgias.  Skin: Positive for color change. Negative for rash.  Neurological: Positive for speech difficulty, weakness and light-headedness. Negative for dizziness, syncope, numbness and headaches.  Hematological: Bruises/bleeds easily.     Physical Exam Updated Vital Signs BP (!) 75/56  Pulse (!) 110   Temp 97.7 F (36.5 C) (Axillary)   Resp (!) 22   Ht '5\' 7"'  (1.702 m)   Wt 94.8 kg (209 lb)   SpO2 94%   BMI 32.73 kg/m   Physical Exam  Constitutional: She is oriented to person, place, and time.  Ill-appearing, with generalized jaundice.  HENT:  Head: Normocephalic and atraumatic.  Mouth/Throat: Oropharynx is clear and moist.  Eyes: Pupils are equal, round, and reactive to light. EOM are normal. Scleral icterus is present.  Neck: Normal range of motion. Neck supple.  Cardiovascular: Normal rate, regular rhythm, S1 normal and S2 normal.  No murmur heard. Pulmonary/Chest: Effort normal and breath sounds normal. She has no wheezes. She has no rales.  Abdominal: Soft. She exhibits no distension. There is tenderness. There is no guarding.  Abdomen protuberant.  No shifting dullness identified.  No fluid wave identified.  No focal tenderness of the abdomen.  Liver nonpalpable due to protuberance of abdomen.  Musculoskeletal: She exhibits edema. She exhibits no deformity.  Lymphadenopathy:    She has no cervical adenopathy.  Neurological: She is alert and oriented to person, place, and  time.  Mental Status:  Alert, oriented, thought content appropriate, able to give a coherent history. Speech fluent without evidence of aphasia. Able to follow 2 step commands without difficulty.  Cranial Nerves:  II:  Peripheral visual fields grossly normal, pupils equal, round, reactive to light III,IV, VI: ptosis not present, extra-ocular motions intact bilaterally  V,VII: smile asymmetric with facial droop on right, facial light touch sensation equal VIII: hearing grossly normal to voice  X: uvula elevates symmetrically  XI: bilateral shoulder shrug symmetric and strong XII: midline tongue extension without fassiculations Motor:  Normal tone. 5/5 in upper and lower extremities bilaterally including strong and equal grip strength and dorsiflexion/plantar flexion Sensory: Light touch normal in all extremities.  Deep Tendon Reflexes: 2+ and symmetric in the biceps and patella. No clonus. Cerebellar: normal finger-to-nose with bilateral upper extremities Gait: normal gait and balance Stance: No pronator drift and good coordination, strength, and position sense with tapping of bilateral arms (performed in sitting position). CV: LE pulses nonpalpable through edema.  Skin: Skin is warm.  Jaundiced appearance to skin.  Generalized ecchymosis in all extremities.  Psychiatric: She has a normal mood and affect. Her behavior is normal. Judgment and thought content normal.  Nursing note and vitals reviewed.    ED Treatments / Results  Labs (all labs ordered are listed, but only abnormal results are displayed) Labs Reviewed  DIC (DISSEMINATED INTRAVASCULAR COAGULATION) PANEL  TROPONIN I  COMPREHENSIVE METABOLIC PANEL  ETHANOL  ACETAMINOPHEN LEVEL  HEPATITIS PANEL, ACUTE  AMMONIA  CBC WITH DIFFERENTIAL/PLATELET  HIV ANTIBODY (ROUTINE TESTING)  LACTATE DEHYDROGENASE  RAPID URINE DRUG SCREEN, HOSP PERFORMED  URINALYSIS, ROUTINE W REFLEX MICROSCOPIC  LIPASE, BLOOD  I-STAT CG4 LACTIC  ACID, ED    EKG None  Radiology No results found.  Procedures Procedures (including critical care time) CRITICAL CARE Performed by: Albesa Seen   Total critical care time: 70 minutes  Critical care time was exclusive of separately billable procedures and treating other patients.  Critical care was necessary to treat or prevent imminent or life-threatening deterioration.  Critical care was time spent personally by me on the following activities: development of treatment plan with patient and/or surrogate as well as nursing, discussions with consultants, evaluation of patient's response to treatment, examination of patient, obtaining history from patient or surrogate, ordering and performing  treatments and interventions, ordering and review of laboratory studies, ordering and review of radiographic studies, pulse oximetry and re-evaluation of patient's condition.   Medications Ordered in ED Medications  iopamidol (ISOVUE-300) 61 % injection (has no administration in time range)  enoxaparin (LOVENOX) injection 40 mg (has no administration in time range)  ondansetron (ZOFRAN) tablet 4 mg (has no administration in time range)    Or  ondansetron (ZOFRAN) injection 4 mg (has no administration in time range)  albuterol (PROVENTIL) (2.5 MG/3ML) 0.083% nebulizer solution 2.5 mg (has no administration in time range)  levofloxacin (LEVAQUIN) IVPB 750 mg (has no administration in time range)  vancomycin (VANCOCIN) 1,500 mg in sodium chloride 0.9 % 500 mL IVPB (has no administration in time range)  aztreonam (AZACTAM) 2 g in sodium chloride 0.9 % 100 mL IVPB (has no administration in time range)  sodium chloride 0.9 % bolus 1,000 mL (0 mLs Intravenous Stopped 06/04/18 1708)  dextrose 50 % solution 50 mL (50 mLs Intravenous Given 06/04/18 1659)  iopamidol (ISOVUE-300) 61 % injection 100 mL (100 mLs Intravenous Contrast Given 06/04/18 1709)  sodium chloride 0.9 % bolus 1,000 mL (0 mLs Intravenous  Stopped 06/04/18 1937)  aztreonam (AZACTAM) 2 g in sodium chloride 0.9 % 100 mL IVPB (0 g Intravenous Stopped 06/04/18 2042)  levofloxacin (LEVAQUIN) IVPB 750 mg (0 mg Intravenous Stopped 06/04/18 2042)  vancomycin (VANCOCIN) 1,500 mg in sodium chloride 0.9 % 500 mL IVPB (0 mg Intravenous Stopped 06/04/18 2256)  albumin human 25 % solution 12.5 g (12.5 g Intravenous New Bag/Given 06/04/18 2347)  sodium chloride 0.9 % bolus 844 mL (0 mLs Intravenous Stopped 06/04/18 2155)  famotidine (PEPCID) IVPB 20 mg premix (0 mg Intravenous Stopped 06/04/18 2135)     Initial Impression / Assessment and Plan / ED Course  I have reviewed the triage vital signs and the nursing notes.  Pertinent labs & imaging results that were available during my care of the patient were reviewed by me and considered in my medical decision making (see chart for details).  Clinical Course as of Jun 04 2310  Sun Jun 04, 2018  1635 Rectal temp noted. Not meeting SIRS hypothermia level of 96.8, however do have enough clinical concern for infection and will order treatment for sepsis.   Temp(!): 96.9 F (36.1 C) [AM]  5638 Likely due to liver dysfunction.  Lactic Acid, Venous(!!): 6.17 [AM]  1716 Code sepsis called. Pt meeting SIRS criteria. Clinical picture not entirely meeting acute on chronic hepatic failure. Do have concern for acute cholangitis, which cannot be rule out at present. Will order blood cultures.   WBC(!): 13.4 [AM]  1718 New since 05/22/18.  Total Bilirubin(!): 14.3 [AM]  1719 Double in Alk phos since 7/22. Concerned for obstructive jaundice.  Alkaline Phosphatase(!): 540 [AM]  7564 Spoke with Dr. Michail Sermon of [facility] gastroenterology who will ensure that [facility] services the patient and patient.  We will proceed to admit to critical care.   [AM]  1821 Spoke with Dr. Michail Sermon gastronenterology, who states that he is low suspicion for acute cholangitis at this time given lack of biliary dilatation.  States the plan remains the  same to see the patient in the morning.  I appreciate Dr. Kathline Magic involvement in the care of this patient.   [AM]  [place] Spoke with Dr. Lake Bells of critical care who will pass consult along to his colleague who will arrive to cover evenings. Will receive call back regarding this patient.  She Dr. Anastasia Pall involvement  in the care of this patient.   [AM]  27 Spoke with Dr. Oletta Darter of pulmonary critical care medicine who states that a low comes critical care physician will be over this evening to evaluate the patient.  This physician, Dr. Zacarias Pontes, will determine the need for ICU admission.   [AM]  2129 Spoke with Dr. Rory Percy of neurology who recommends MRI with and without contrast.  Should there be any change in the clinical picture such as nuchal rigidity, fevers, acutely altered mental status, further evaluation with lumbar puncture may be warranted, but not at this time.  I appreciate Dr. Johny Chess involvement in the care of this patient.   [AM]    Clinical Course User Index [AM] Albesa Seen, PA-C    Patient has generalized jaundice with presentation concerning for acute hepatic failure.  Patient hypotensive and tachycardic, which improved even before fluid repletion.  At this time, I feel that patient's neurologic complaints are primarily metabolic in nature, and patient is not meeting criteria for code stroke.  Additionally, patient's complaints are greater than 24 hours old, making acute stroke work-up not warranted.  With generalized edema, I do have concerns that patient is third spacing due to acute hepatic failure, requiring intravascular volume repletion.  Patient critically ill, requiring higher level of care, and requiring the involvement of multiple specialists.  Differential diagnosis includes acute cholangitis, obstructive jaundice, iatrogenic hepatic failure (patient on hydroxychloroquine) hepatitis, HIV, autoimmune.  Vitals:   06/04/18 1512 06/04/18 1600 06/04/18 1632 06/04/18  1703  BP: (!) 75/56 91/62  (!) 90/56  Pulse:  (!) 101  99  Resp:  20  18  Temp:   (!) 96.9 F (36.1 C)   TempSrc:   Rectal   SpO2:  (!) 87%    Weight:      Height:         Broad spectrum antibiotics initiated based on suspected source of infection. Fluid bolus administered due to hypotension. Signs of end organ perfusion problems include renal, bili, AMS, cardiac (elevated troponin), lactic acidosis.  Sepsis - Repeat Assessment  Performed at:    [facility]:   06/04/18 1800 06/04/18 1815  BP: (!) 102/56 93/76  Pulse:    Resp: 20 17  Temp:    SpO2: 100% on 2L     Heart:     Regular rate and rhythm  Lungs:    CTA  Capillary Refill:   <2 sec  Peripheral Pulse:   Radial pulse palpable  Skin:     Jaundiced, unchanged.    I spoke with Dr. Gilford Raid, critical care locums physician who recommends that patient receive a full neurologic work-up in tandem with patient's gastroenterology work-up.  Discussed that patient may need lumbar puncture as well as additional MRI, which was previously performed out of our system a couple months ago.  We discussed that patient is not requiring ICU level services at this time with stable MAPs.  Dr. Gilford Raid also discussed that patient could have undiagnosed infection, fungal or otherwise given long-term Etanercept use.  Lactic acidosis likely secondary to liver dysfunction.  Cultures are pending.  I appreciate her involvement in the care of this patient.    Discussed with Dr. Laverta Baltimore, and we discussed that patient likely not a candidate for immediate LP secondary to coagulopathy.  Will discuss with hospitalist.   Per discussion with specialists as documented above, patient to be admitted to [facility] where neuro hospitalist services are  available if needed after MRI of brain.   This is a shared visit with Dr. Nanda Quinton. Patient was independently evaluated by this attending physician. Attending physician consulted in  evaluation and management.  Final Clinical Impressions(s) / ED Diagnoses   Final diagnoses:  Jaundice  Elevated alkaline phosphatase level  AKI (acute kidney injury) ([place])  Colitis  SIRS (systemic inflammatory response syndrome) [facility]    ED Discharge Orders    None       Tamala Julian 06/04/18 2357    Margette Fast, MD 06/05/18 1143

## 2018-06-04 NOTE — ED Notes (Signed)
Date and time results received: 06/04/18 1657 (use smartphrase ".now" to insert current time)  Test: Trop Critical Value: 0.04  Name of Provider Notified: Long    Orders Received? Or Actions Taken?: Actions Taken: Chiropodist and EDP

## 2018-06-04 NOTE — ED Notes (Signed)
ED TO INPATIENT HANDOFF REPORT  Name/Age/Gender Jordan Russell 47 y.o. female  Code Status Code Status History    Date Active Date Inactive Code Status Order ID Comments User Context   03/10/2016 0128 03/11/2016 1335 Full Code [id number]  Vianne Bulls, MD ED      Home/SNF/Other Home  Chief Complaint Slurred Speech/ Unable to Walk / Allergic Medical Reaction to Medication   Level of Care/Admitting Diagnosis ED Disposition    ED Disposition Condition [place]: [place] [[id number]]  Level of Care: Stepdown [14]  Diagnosis: Severe sepsis [facility]) [[id number]]  Admitting Physician: Norval Morton [[id number]]  Attending Physician: Norval Morton [[id number]]  Estimated length of stay: past midnight tomorrow  Certification:: I certify this patient will need inpatient services for at least 2 midnights  PT Class (Do Not Modify): Inpatient [101]  PT Acc Code (Do Not Modify): Private [1]       Medical History Past Medical History:  Diagnosis Date  . Asthma   . Bipolar 1 disorder ([place])   . Fibromyalgia   . GAD (generalized anxiety disorder)   . Gastroparesis   . GERD (gastroesophageal reflux disease)   . Hiatal hernia   . History of kidney stones   . History of primary hyperparathyroidism    s/p  bilateral inferior parathyroidectomy 08-03-2010  . IBS (irritable bowel syndrome)   . Left ureteral stone   . Migraine   . PONV (postoperative nausea and vomiting)    and claustrophobic with mask  . RA (rheumatoid arthritis) ([place])    dr Gavin Pound-- rheumtologist  . Self mutilating behavior   . Thyroid disease   . Urgency of urination   . Wears glasses     Allergies Allergies  Allergen Reactions  . Sulfasalazine Hives  . Lamictal [Lamotrigine] Nausea And Vomiting and Other (See Comments)    Tremors, diplopia  . Penicillins Hives and Itching    As a child, "breathing, itching problem" Has patient had a PCN reaction causing  immediate rash, facial/tongue/throat swelling, SOB or lightheadedness with hypotension: Yes Has patient had a PCN reaction causing severe rash involving mucus membranes or skin necrosis: Yes Has patient had a PCN reaction that required hospitalization No Has patient had a PCN reaction occurring within the last 10 years: No If all of the above answers are "NO", then may proceed with Cephalosporin use.   . Sulfa Antibiotics Hives  . Morphine And Related Itching and Rash    IV Location/Drains/Wounds Patient Lines/Drains/Airways Status   Active Line/Drains/Airways    Name:   Placement date:   Placement time:   Site:   Days:   Peripheral IV 06/04/18 Right;Upper Wrist   06/04/18    2028    Wrist   less than 1   Peripheral IV 06/04/18 Left Forearm   06/04/18    1800    Forearm   less than 1          Labs/Imaging Results for orders placed or performed during the hospital encounter of 06/04/18 (from the past 48 hour(s))  DIC panel     Status: Abnormal   Collection Time: 06/04/18  4:18 PM  Result Value Ref Range   Prothrombin Time 16.1 (H) 11.4 - 15.2 seconds   INR 1.30    aPTT 30 24 - 36 seconds   Fibrinogen 473 210 - 475 mg/dL   D-Dimer, Quant 2.30 (H) 0.00 - 0.50 ug/mL-FEU    Comment: (NOTE)  At the manufacturer cut-off of 0.50 ug/mL FEU, this assay has been documented to exclude PE with a sensitivity and negative predictive value of 97 to 99%.  At this time, this assay has not been approved by the FDA to exclude DVT/VTE. Results should be correlated with clinical presentation.    Platelets 158 150 - 400 K/uL   Smear Review NO SCHISTOCYTES SEEN     Comment: Performed at [facility], [place] [address] [place], [place] [address]  Troponin I     Status: Abnormal   Collection Time: 06/04/18  4:18 PM  Result Value Ref Range   Troponin I 0.04 (HH) <0.03 ng/mL    Comment: CRITICAL RESULT CALLED TO, READ BACK BY AND VERIFIED WITH: CLAPP,S RN [phone]  COVINGTON,N Performed at [facility] [address], [place], [place] [address]   Comprehensive metabolic panel     Status: Abnormal   Collection Time: 06/04/18  4:18 PM  Result Value Ref Range   Sodium 132 (L) 135 - 145 mmol/L   Potassium 4.2 3.5 - 5.1 mmol/L   Chloride 98 98 - 111 mmol/L   CO2 19 (L) 22 - 32 mmol/L   Glucose, Bld 59 (L) 70 - 99 mg/dL   BUN 18 6 - 20 mg/dL   Creatinine, Ser 1.25 (H) 0.44 - 1.00 mg/dL   Calcium 8.0 (L) 8.9 - 10.3 mg/dL   Total Protein 4.3 (L) 6.5 - 8.1 g/dL   Albumin 1.9 (L) 3.5 - 5.0 g/dL   AST 91 (H) 15 - 41 U/L   ALT 91 (H) 0 - 44 U/L   Alkaline Phosphatase 540 (H) 38 - 126 U/L   Total Bilirubin 14.3 (H) 0.3 - 1.2 mg/dL   GFR calc non Af Amer 50 (L) >60 mL/min   GFR calc Af Amer 58 (L) >60 mL/min    Comment: (NOTE) The eGFR has been calculated using the CKD EPI equation. This calculation has not been validated in all clinical situations. eGFR's persistently <60 mL/min signify possible Chronic Kidney Disease.    Anion gap 15 5 - 15    Comment: Performed at [facility], [place] [address], [place], [place] [address]  Ethanol     Status: None   Collection Time: 06/04/18  4:18 PM  Result Value Ref Range   Alcohol, Ethyl (B) <10 <10 mg/dL    Comment: (NOTE) Lowest detectable limit for serum alcohol is 10 mg/dL. For medical purposes only. Performed at [facility], [place] [address], [place], [place] [address]   Acetaminophen level     Status: Abnormal   Collection Time: 06/04/18  4:18 PM  Result Value Ref Range   Acetaminophen (Tylenol), Serum <10 (L) 10 - 30 ug/mL    Comment: Performed at [facility], [place] [address] [place], [place] [address]  Ammonia     Status: None   Collection Time: 06/04/18  4:18 PM  Result Value Ref Range   Ammonia 28 9 - 35 umol/L    Comment: Performed at [facility], [place] [address] [place], [place] [address]  CBC  with Differential     Status: Abnormal   Collection Time: 06/04/18  4:18 PM  Result Value Ref Range   WBC 13.4 (H) 4.0 - 10.5 K/uL   RBC 4.07 3.87 - 5.11 MIL/uL   Hemoglobin 12.8 12.0 - 15.0 g/dL   HCT 38.2 36.0 - 46.0 %   MCV 93.9 78.0 -  100.0 fL   MCH 31.4 26.0 - 34.0 pg   MCHC 33.5 30.0 - 36.0 g/dL   RDW 16.4 (H) 11.5 - 15.5 %   Platelets 158 150 - 400 K/uL   Neutrophils Relative % 89 %   Lymphocytes Relative 6 %   Monocytes Relative 5 %   Eosinophils Relative 0 %   Basophils Relative 0 %   Neutro Abs 11.9 (H) 1.7 - 7.7 K/uL   Lymphs Abs 0.8 0.7 - 4.0 K/uL   Monocytes Absolute 0.7 0.1 - 1.0 K/uL   Eosinophils Absolute 0.0 0.0 - 0.7 K/uL   Basophils Absolute 0.0 0.0 - 0.1 K/uL   RBC Morphology POLYCHROMASIA PRESENT     Comment: [employer] Performed at [facility], [place] [address], [place], [place] [address]   Lactate dehydrogenase     Status: Abnormal   Collection Time: 06/04/18  4:18 PM  Result Value Ref Range   LDH 247 (H) 98 - 192 U/L    Comment: Performed at [facility], [place] [address] [place], [place] [address]  Lipase, blood     Status: None   Collection Time: 06/04/18  4:18 PM  Result Value Ref Range   Lipase 20 11 - 51 U/L    Comment: Performed at [facility], [place] [address] [place], [place] [address]  Bilirubin, direct     Status: Abnormal   Collection Time: 06/04/18  4:20 PM  Result Value Ref Range   Bilirubin, Direct 8.9 (H) 0.0 - 0.2 mg/dL    Comment: Performed at [facility], [place] [address], [place], [place] [address]  I-Stat CG4 Lactic Acid, ED     Status: Abnormal   Collection Time: 06/04/18  4:31 PM  Result Value Ref Range   Lactic Acid, Venous 6.17 (HH) 0.5 - 1.9 mmol/L   Comment NOTIFIED PHYSICIAN   I-Stat Chem 8, ED     Status: Abnormal   Collection Time: 06/04/18  4:46 PM  Result Value Ref Range   Sodium 133 (L) 135 - 145 mmol/L   Potassium 4.2 3.5 - 5.1 mmol/L    Chloride 101 98 - 111 mmol/L   BUN 18 6 - 20 mg/dL   Creatinine, Ser 1.50 (H) 0.44 - 1.00 mg/dL   Glucose, Bld 50 (L) 70 - 99 mg/dL   Calcium, Ion 1.02 (L) 1.15 - 1.40 mmol/L   TCO2 20 (L) 22 - 32 mmol/L   Hemoglobin 12.2 12.0 - 15.0 g/dL   HCT 36.0 36.0 - 46.0 %  POC CBG, ED     Status: Abnormal   Collection Time: 06/04/18  5:28 PM  Result Value Ref Range   Glucose-Capillary 110 (H) 70 - 99 mg/dL  CBG monitoring, ED     Status: None   Collection Time: 06/04/18  6:31 PM  Result Value Ref Range   Glucose-Capillary 72 70 - 99 mg/dL  I-Stat CG4 Lactic Acid, ED     Status: Abnormal   Collection Time: 06/04/18  6:33 PM  Result Value Ref Range   Lactic Acid, Venous 6.23 (HH) 0.5 - 1.9 mmol/L   Comment NOTIFIED PHYSICIAN   Rapid urine drug screen (hospital performed)     Status: Abnormal   Collection Time: 06/04/18  6:36 PM  Result Value Ref Range   Opiates NONE DETECTED NONE DETECTED   Cocaine NONE DETECTED NONE DETECTED   Benzodiazepines POSITIVE (A) NONE DETECTED   Amphetamines NONE DETECTED NONE DETECTED   Tetrahydrocannabinol  NONE DETECTED NONE DETECTED   Barbiturates NONE DETECTED NONE DETECTED    Comment: (NOTE) DRUG SCREEN FOR MEDICAL PURPOSES ONLY.  IF CONFIRMATION IS NEEDED FOR ANY PURPOSE, NOTIFY LAB WITHIN 5 DAYS. LOWEST DETECTABLE LIMITS FOR URINE DRUG SCREEN Drug Class                     Cutoff (ng/mL) Amphetamine and metabolites    1000 Barbiturate and metabolites    200 Benzodiazepine                 962 Tricyclics and metabolites     300 Opiates and metabolites        300 Cocaine and metabolites        300 THC                            50 Performed at [facility], [place] [address] [place], [place] [address]   Urinalysis, Routine w reflex microscopic     Status: Abnormal   Collection Time: 06/04/18  6:36 PM  Result Value Ref Range   Color, Urine BROWN (A) YELLOW    Comment: BIOCHEMICALS MAY BE AFFECTED BY COLOR   APPearance CLOUDY (A)  CLEAR   Specific Gravity, Urine  1.005 - 1.030    TEST NOT REPORTED DUE TO COLOR INTERFERENCE OF URINE PIGMENT   pH  5.0 - 8.0    TEST NOT REPORTED DUE TO COLOR INTERFERENCE OF URINE PIGMENT   Glucose, UA (A) NEGATIVE mg/dL    TEST NOT REPORTED DUE TO COLOR INTERFERENCE OF URINE PIGMENT   Hgb urine dipstick (A) NEGATIVE    TEST NOT REPORTED DUE TO COLOR INTERFERENCE OF URINE PIGMENT   Bilirubin Urine (A) NEGATIVE    TEST NOT REPORTED DUE TO COLOR INTERFERENCE OF URINE PIGMENT   Ketones, ur (A) NEGATIVE mg/dL    TEST NOT REPORTED DUE TO COLOR INTERFERENCE OF URINE PIGMENT   Protein, ur (A) NEGATIVE mg/dL    TEST NOT REPORTED DUE TO COLOR INTERFERENCE OF URINE PIGMENT   Nitrite (A) NEGATIVE    TEST NOT REPORTED DUE TO COLOR INTERFERENCE OF URINE PIGMENT   Leukocytes, UA (A) NEGATIVE    TEST NOT REPORTED DUE TO COLOR INTERFERENCE OF URINE PIGMENT    Comment: Performed at [facility], [place] [address] [place], [place] [address]  Urinalysis, Microscopic (reflex)     Status: Abnormal   Collection Time: 06/04/18  6:36 PM  Result Value Ref Range   RBC / HPF 0-5 0 - 5 RBC/hpf   WBC, UA 0-5 0 - 5 WBC/hpf   Bacteria, UA FEW (A) NONE SEEN   Squamous Epithelial / LPF 0-5 0 - 5   Non Squamous Epithelial PRESENT (A) NONE SEEN   Granular Casts, UA PRESENT     Comment: Performed at [facility], [place] [address], [place], [place] [address]   Ct Head Wo Contrast  Result Date: 06/04/2018 CLINICAL DATA:  Altered mental status. Difficulty walking for 2 weeks with slurred speech. Possible medication reaction. EXAM: CT HEAD WITHOUT CONTRAST TECHNIQUE: Contiguous axial images were obtained from the base of the skull through the vertex without intravenous contrast. COMPARISON:  CT head 05/31/2018. FINDINGS: Brain: There is no evidence of acute intracranial hemorrhage, mass lesion, brain edema or extra-axial fluid collection. The ventricles and subarachnoid spaces are  appropriately sized for age. There is no CT evidence of acute cortical  infarction. Vascular:  No hyperdense vessel identified. Skull: Negative for fracture or focal lesion. Prominent scaphocephaly again noted. Sinuses/Orbits: The visualized paranasal sinuses and mastoid air cells are clear. No orbital abnormalities are seen. Other: None. IMPRESSION: Stable head CT without acute intracranial findings. Electronically Signed   By: Richardean Sale M.D.   On: 06/04/2018 17:35   Ct Abdomen Pelvis W Contrast  Result Date: 06/04/2018 CLINICAL DATA:  Painless jaundice, weight loss or fatigue of more than 3 months duration, slurred speech, unable to walk for 2 weeks, worsening symptoms, history asthma, fibromyalgia, gastric paresis, GERD, kidney stones, primary hyperparathyroidism, irritable bowel syndrome, rheumatoid arthritis, smoker EXAM: CT ABDOMEN AND PELVIS WITH CONTRAST TECHNIQUE: Multidetector CT imaging of the abdomen and pelvis was performed using the standard protocol following bolus administration of intravenous contrast. Sagittal and coronal MPR images reconstructed from axial data set. CONTRAST:  172m ISOVUE-300 IOPAMIDOL (ISOVUE-300) INJECTION 61% IV. No oral contrast. COMPARISON:  11/05/2016 FINDINGS: Lower chest: Infiltrate and atelectasis in RIGHT middle lobe. Subsegmental atelectasis lingula Hepatobiliary: Marked fatty infiltration of liver. Gallbladder surgically absent. No biliary dilatation. Pancreas: Normal appearance Spleen: Normal appearance Adrenals/Urinary Tract: BILATERAL nonobstructing renal calculi. Adrenal glands, kidneys, ureters, and bladder otherwise normal appearance. Small Stomach/Bowel: Normal appendix. Prior gastric bypass surgery with hiatal hernia versus distension of reservoir in the inferior mediastinum. Fluid within the stented esophagus. Few minimally prominent small bowel loops with out evidence of obstruction. Small bowel loops otherwise unremarkable. Significant bowel wall  thickening of cecum, ascending colon, and scattered sites at of the mid transverse colon, splenic flexure and descending colon compatible with colitis. Vascular/Lymphatic: Aorta normal caliber with minimal atherosclerotic calcification. No adenopathy. Scattered pelvic phleboliths. Reproductive: Uterus surgically absent. Nonvisualization of ovaries. Other: Low-attenuation free fluid in pelvis. No free air. No hernia. Musculoskeletal: Bones demineralized. IMPRESSION: Patchy areas of colonic wall thickening greatest at ascending colon and cecum consistent with colitis; differential diagnosis includes infection and inflammatory bowel disease, with ischemia considered less likely due to lack of significant vascular disease changes. Prior gastric bypass surgery with either hiatal hernia or distension of pouch and fluid within the distal esophagus. Marked fatty infiltration of liver. Small amount of free pelvic fluid. Electronically Signed   By: MLavonia DanaM.D.   On: 06/04/2018 17:45   Dg Chest Portable 1 View  Result Date: 06/04/2018 CLINICAL DATA:  Hypotension, lethargy, weakness EXAM: PORTABLE CHEST 1 VIEW COMPARISON:  Portable exam 1554 hours compared to 05/22/2018 FINDINGS: Normal heart size mediastinal contours. BILATERAL perihilar interstitial infiltrates question edema versus infection. Minimal subsegmental atelectasis in LEFT upper lobe. Remaining lungs clear. No pleural effusion or pneumothorax. Bones demineralized. IMPRESSION: New perihilar infiltrates question edema versus infection. Electronically Signed   By: MLavonia DanaM.D.   On: 06/04/2018 16:17   UKoreaAbdomen Limited Ruq  Result Date: 06/04/2018 CLINICAL DATA:  Jaundice EXAM: ULTRASOUND ABDOMEN LIMITED RIGHT UPPER QUADRANT COMPARISON:  None FINDINGS: Gallbladder: Gallbladder surgically absent Common bile duct: Diameter: A tubular structure measuring 17 mm in diameter is seen at the porta hepatis. No definite flow is seen within this structure on  color Doppler imaging. Based on accompanying CT exam done immediately prior, this does not represent the common bile duct, which measures only 4 mm diameter on CT. Likely this represents the portal vein which has very slow flow not demonstrated by ultrasound; portal vein was opacified by contrast and patent on CT, not thrombosed. Liver: Increased hepatic echogenicity consistent with the marked fatty infiltration seen on CT. No focal hepatic  mass lesion. Portal vein is patent and opacified by CT, with flow detected in the LEFT portal vein on color Doppler imaging but not definitely within the main portal vein question slow flow. No RIGHT upper quadrant free fluid. IMPRESSION: Marked fatty infiltration of liver. Post cholecystectomy. Suspect slow flow within the portal vein, which is patent and enhancing on CT. Electronically Signed   By: Lavonia Dana M.D.   On: 06/04/2018 18:34    Pending Labs Unresulted Labs (From admission, onward)   Start     Ordered   06/04/18 1658  Blood Culture (routine x 2)  BLOOD CULTURE X 2,   STAT     06/04/18 1705   06/04/18 1547  HIV antibody  Once,   STAT     06/04/18 1546   06/04/18 1545  Hepatitis panel, acute  STAT,   STAT     06/04/18 1546      Vitals/Pain Today's Vitals   06/04/18 1956 06/04/18 2045 06/04/18 2058 06/04/18 2115  BP: (!) 97/54 106/65  105/62  Pulse: (!) 25 (!) 104  (!) 101  Resp: (!) 21 (!) 21  19  Temp:   97.7 F (36.5 C)   TempSrc:   Oral   SpO2: (!) 78% 97%  98%  Weight:      Height:      PainSc:        Isolation Precautions No active isolations  Medications Medications  iopamidol (ISOVUE-300) 61 % injection (has no administration in time range)  vancomycin (VANCOCIN) 1,500 mg in sodium chloride 0.9 % 500 mL IVPB (1,500 mg Intravenous New Bag/Given 06/04/18 2056)  albumin human 25 % solution 12.5 g (has no administration in time range)  sodium chloride 0.9 % bolus 844 mL (844 mLs Intravenous New Bag/Given 06/04/18 2054)  sodium  chloride 0.9 % bolus 1,000 mL (0 mLs Intravenous Stopped 06/04/18 1708)  dextrose 50 % solution 50 mL (50 mLs Intravenous Given 06/04/18 1659)  iopamidol (ISOVUE-300) 61 % injection 100 mL (100 mLs Intravenous Contrast Given 06/04/18 1709)  sodium chloride 0.9 % bolus 1,000 mL (0 mLs Intravenous Stopped 06/04/18 1937)  aztreonam (AZACTAM) 2 g in sodium chloride 0.9 % 100 mL IVPB (0 g Intravenous Stopped 06/04/18 2042)  levofloxacin (LEVAQUIN) IVPB 750 mg (0 mg Intravenous Stopped 06/04/18 2042)  famotidine (PEPCID) IVPB 20 mg premix (0 mg Intravenous Stopped 06/04/18 2135)    Mobility walks with person assist

## 2018-06-04 NOTE — ED Notes (Signed)
She is drowsy and in no distress. I have been unable to initiate IV. Our C.N., Manuela Schwartz will start u/s-guided IV shortly.

## 2018-06-04 NOTE — ED Notes (Signed)
Bed: WA13 Expected date:  Expected time:  Means of arrival:  Comments: Res B 

## 2018-06-04 NOTE — ED Notes (Signed)
Pt said that she is unable to urinate with the purewick in place, discussed the used of bedpan because she Is so weak. She insisted that she use a bedside commode. She required 1 assist to get to the bedside commode, and once on the commode she said she was becoming very dizzy. We assisted her back to bed and I discussed with her that we would have to use the bedpan or purewick in the future due to her safety and potential for falls.

## 2018-06-04 NOTE — H&P (Signed)
History and Physical    YOUNG BRIM JOI:[id number] DOB: 1971/02/07 DOA: 06/04/2018  Referring MD/NP/PA: Jhonnie Garner PCP: Josetta Huddle, MD  Patient coming from: home  Chief Complaint: Confusion  I have personally briefly reviewed patient's old medical records in [place]   HPI: Jordan Russell is a 47 y.o. female with medical history significant of RA on Ebrel and Plaquenil, bipolar, GAD, hyperparathyroidism s/p thyroidectomy, gastroparesis, nephrolithiasis, s/p gastric bypass; who presents with a progressive decline in mental and functional status over the last 3-6 months.  She had been dealing with leg swelling over the last 2 years that never went away.  However, he recently got to the point which she needed a walker/cane to ambulate, but now is unable to get up or move without significant assistance.  Patient has had multiple falls even seen in the ED after hitting her head on 7/23.  Husband notes that the patient has become more yellow in color.  She has been being followed by [facility] for elevated liver enzymes.  Evaluated with a liver biopsy in June, but told that the results were normal.  During this time she has had progressively worsening slurred speech, right-sided facial droop, and more difficulty with recent memory.  Other associated symptoms include generalized malaise, nausea, vomiting that she reports is chronic due to history of gastroparesis, abdominal pain, poor p.o. intake, increased bruising, dark color change of urine, constipation reporting bowel movement once per week, change in urine color.  Significant travel included cruise to [place], [place], and [place].  Denies any significant chest pain, diarrhea dysuria, alcohol, or drug use.    ED Course: Upon admission into the emergency department patient was noted to be afebrile, pulse up to 110, respirations 17-22, blood pressure 72/62-106/65, O2 saturation 78 to 98% after being placed on 3 L of oxygen.  Labs  revealed WBC 13.4, hemoglobin 12.8, sodium 132, CO2 19, BUN 18, creatinine 1.25, glucose 59, alkaline phosphatase 540, albumin 1.2, AST 91, ALT 91, ammonia 28, total bilirubin 14.3, direct bilirubin 8.9, d-dimer 2.3, troponin 0.04, BNP 247, and lactic acid trending up to 6.23.  Patient with new perihilar infiltrates thought to be infectious versus edema.  CT scan of brain showed no acute abnormalities.  CT scan of the abdomen/pelvis showed signs of colitis and fatty infiltration of the liver.  Due to the severity of patient's symptoms PCCM, neurology, and gastroenterology were all consulted.  Sepsis protocol has been initiated with patient receiving full fluid bolus with empiric antibiotics of vancomycin, Levaquin, and aztreonam.  Patient was given 12.5 g of albumin, Pepcid, and 50 mL of dextrose due to hypoglycemia.  Patient called to admit.   Review of Systems  Constitutional: Positive for malaise/fatigue. Negative for fever.  HENT: Negative for nosebleeds.   Eyes: Negative for pain.  Respiratory: Positive for shortness of breath.   Cardiovascular: Positive for leg swelling. Negative for chest pain.  Gastrointestinal: Positive for abdominal pain, constipation, nausea and vomiting.  Genitourinary: Positive for hematuria. Negative for flank pain.  Musculoskeletal: Positive for falls.  Skin: Positive for rash.  Neurological: Positive for speech change and weakness.  Endo/Heme/Allergies: Bruises/bleeds easily.  Psychiatric/Behavioral: Positive for memory loss. Negative for substance abuse.    Past Medical History:  Diagnosis Date  . Asthma   . Bipolar 1 disorder ([place])   . Fibromyalgia   . GAD (generalized anxiety disorder)   . Gastroparesis   . GERD (gastroesophageal reflux disease)   . Hiatal hernia   .  History of kidney stones   . History of primary hyperparathyroidism    s/p  bilateral inferior parathyroidectomy 08-03-2010  . IBS (irritable bowel syndrome)   . Left ureteral stone   .  Migraine   . PONV (postoperative nausea and vomiting)    and claustrophobic with mask  . RA (rheumatoid arthritis) ([place])    dr Gavin Pound-- rheumtologist  . Self mutilating behavior   . Thyroid disease   . Urgency of urination   . Wears glasses     Past Surgical History:  Procedure Laterality Date  . ABDOMINAL HYSTERECTOMY    . BALLOON DILATION N/A 10/01/2014   Procedure: BALLOON DILATION;  Surgeon: Garlan Fair, MD;  Location: [facility];  Service: Endoscopy;  Laterality: N/A;  . CYSTO/  BILATERAL RETROGRADE PYELOGRAM  11/20/2000  . CYSTO/  RIGHT RETROGRADE PYELOGRAM/  RIGHT URETEROSCOPY/  STENT PLACEMENT  12/16/2006  . CYSTOSCOPY/URETEROSCOPY/HOLMIUM LASER/STENT PLACEMENT Left 11/09/2016   Procedure: CYSTOSCOPY/URETEROSCOPY/HOLMIUM LASER/STENT PLACEMENT;  Surgeon: Nickie Retort, MD;  Location: [facility];  Service: Urology;  Laterality: Left;  90 MINS  [phone]   . ESOPHAGEAL MANOMETRY N/A 12/23/2014   Procedure: ESOPHAGEAL MANOMETRY (EM);  Surgeon: Garlan Fair, MD;  Location: [facility];  Service: Endoscopy;  Laterality: N/A;  . ESOPHAGOGASTRODUODENOSCOPY  last one 03-28-2015  . ESOPHAGOGASTRODUODENOSCOPY (EGD) WITH PROPOFOL N/A 10/01/2014   Procedure: ESOPHAGOGASTRODUODENOSCOPY (EGD) WITH PROPOFOL;  Surgeon: Garlan Fair, MD;  Location: [facility];  Service: Endoscopy;  Laterality: N/A;  . EXTRACORPOREAL SHOCK WAVE LITHOTRIPSY  multiple times since age 46  . HOLMIUM LASER APPLICATION Left 01/07/1016   Procedure: HOLMIUM LASER APPLICATION;  Surgeon: Nickie Retort, MD;  Location: [facility];  Service: Urology;  Laterality: Left;  . KNEE ARTHROSCOPY Right 03-04-2014  [facility]   w/ Arthrotomy  . LAPAROSCOPIC ASSISTED VAGINAL HYSTERECTOMY  12/28/2005  . LAPAROSCOPIC CHOLECYSTECTOMY  01/17/2004  . LAPAROSCOPY LEFT OVARIAN CYSTECTOMY/  BILATERAL TUBAL LIGATION  02/26/2003  . LEFT URETEROSCOPIC STONE EXTRACTION /  STENT  PLACEMENT  07/09/2002  . NECK EXPLORATION/  BILATERAL INFERIOR PARATHYROIDECTOMY  08/03/2010  . RIGHT URETERAL DILATION/  URETEROSCOPIC STONE EXTRACTION  06/12/2010  . ROUX-EN-Y GASTRIC BYPASS  2001  . SOLYX TRANSURETHRAL SLING/  POSTERIOR PELVIC FLOOR SACROSPINOUS REPAIR  02/21/2009   and Cysto/  Bilateral ureteral stent placement  . TONSILLECTOMY AND ADENOIDECTOMY    . WRIST GANGLION EXCISION Right 01/28/2000     reports that she has been smoking cigarettes.  She has a 5.00 pack-year smoking history. She has never used smokeless tobacco. She reports that she drinks alcohol. She reports that she does not use drugs.  Allergies  Allergen Reactions  . Sulfasalazine Hives  . Lamictal [Lamotrigine] Nausea And Vomiting and Other (See Comments)    Tremors, diplopia  . Penicillins Hives and Itching    As a child, "breathing, itching problem" Has patient had a PCN reaction causing immediate rash, facial/tongue/throat swelling, SOB or lightheadedness with hypotension: Yes Has patient had a PCN reaction causing severe rash involving mucus membranes or skin necrosis: Yes Has patient had a PCN reaction that required hospitalization No Has patient had a PCN reaction occurring within the last 10 years: No If all of the above answers are "NO", then may proceed with Cephalosporin use.   . Sulfa Antibiotics Hives  . Morphine And Related Itching and Rash    Family History  Problem Relation Age of Onset  . Bipolar disorder Son   . Breast cancer Mother   .  Breast cancer Sister   . Anxiety disorder Brother   . Depression Brother   . Breast cancer Maternal Aunt   . Breast cancer Maternal Grandmother     Prior to Admission medications   Medication Sig Start Date End Date Taking? Authorizing Provider  acetaminophen (TYLENOL) 500 MG tablet Take 1,000 mg by mouth every 6 (six) hours as needed for headache.    Yes [provider]  albuterol (PROVENTIL) (2.5 MG/3ML) 0.083% nebulizer solution  Take 3 mLs (2.5 mg total) by nebulization every 4 (four) hours as needed for wheezing or shortness of breath. 09/20/14  Yes Malvin Johns, MD  ALPRAZolam Duanne Moron) 1 MG tablet Take 1 mg by mouth 4 (four) times daily as needed for anxiety.    Yes [provider]  buPROPion (WELLBUTRIN SR) 100 MG 12 hr tablet Take 100 mg by mouth 3 (three) times daily. AM, LUNCH, 1600    Yes [provider]  cetirizine (ZYRTEC) 10 MG tablet Take 10 mg by mouth daily.   Yes [provider]  cyanocobalamin (,VITAMIN B-12,) 1000 MCG/ML injection Inject 1,000 mcg every 30 (thirty) days into the muscle. 07/28/17  Yes [provider]  etanercept (ENBREL) 50 MG/ML injection Inject 50 mg into the skin once a week. friday's   Yes [provider]  hydroxychloroquine (PLAQUENIL) 200 MG tablet Take 200 mg by mouth 2 (two) times daily.   Yes [provider]  LATUDA 80 MG TABS tablet Take 80 mg by mouth at bedtime.  08/27/17  Yes [provider]  levothyroxine (SYNTHROID, LEVOTHROID) 50 MCG tablet Take 50 mcg by mouth daily before breakfast.   Yes [provider]  lubiprostone (AMITIZA) 24 MCG capsule Take 24 mcg by mouth 2 (two) times daily with a meal.   Yes [provider]  naproxen (NAPROSYN) 500 MG tablet Take 500 mg by mouth 2 (two) times daily with a meal.   Yes [provider]  omeprazole (PRILOSEC) 20 MG capsule Take 20 mg by mouth 2 (two) times daily before a meal.   Yes [provider]  promethazine (PHENERGAN) 25 MG tablet Take 25 mg by mouth every 6 (six) hours as needed for nausea or vomiting.   Yes [provider]  QUEtiapine (SEROQUEL) 300 MG tablet Take 600 mg by mouth at bedtime.   Yes [provider]  sodium chloride (OCEAN) 0.65 % SOLN nasal spray Place 1 spray into both nostrils as needed for congestion.   Yes [provider]  topiramate (TOPAMAX) 25 MG capsule Take 50 mg by mouth 2 (two)  times daily.  12/10/14  Yes [provider]  potassium chloride SA (K-DUR,KLOR-CON) 20 MEQ tablet Take 1 tablet (20 mEq total) by mouth 2 (two) times daily for 4 days. 05/22/18 05/26/18  Long, Wonda Olds, MD    Physical Exam:  Constitutional: Elderly female who is able to follow commands. Vitals:   06/04/18 1945 06/04/18 1956 06/04/18 2045 06/04/18 2058  BP: (!) 100/45 (!) 97/54 106/65   Pulse:  (!) 25 (!) 104   Resp: 18 (!) 21 (!) 21   Temp:    97.7 F (36.5 C)  TempSrc:    Oral  SpO2:  (!) 78% 97%   Weight:      Height:       Eyes: PERRL, scleral icterus present. ENMT: Mucous membranes are dry. Posterior pharynx clear of any exudate or lesions. .  Neck: normal, supple, no masses, no thyromegaly Respiratory: clear to auscultation  bilaterally, no wheezing, no crackles. Normal respiratory effort. No accessory muscle use.  Cardiovascular: Regular rate and rhythm, no murmurs / rubs / gallops.  3+ pitting bilateral lower extremity edema. 2+ pedal pulses. No carotid bruits.  Abdomen:tenderness present of the right upper quadrant and lower quadrants, no masses palpated. No hepatosplenomegaly. Bowel sounds positive.  Musculoskeletal: no clubbing / cyanosis. No joint deformity upper and lower extremities. Good ROM, no contractures. Normal muscle tone.  Skin: Jaundiced with bruising of the upper and lower extremities. Neurologic: CN 2-12 grossly intact.  Mild slowing of speech with some right-sided facial droop noted. Psychiatric: Normal judgment and insight. Alert and oriented x 3. Normal mood.     Labs on Admission: I have personally reviewed following labs and imaging studies  CBC: Recent Labs  Lab 05/31/18 1008 06/04/18 1618 06/04/18 1646  WBC 10.0 13.4*  --   NEUTROABS 7.7 11.9*  --   HGB 12.6 12.8 12.2  HCT 36.3 38.2 36.0  MCV 91.2 93.9  --   PLT 225 158  158  --    Basic Metabolic Panel: Recent Labs  Lab 05/31/18 1008 06/04/18 1618 06/04/18 1646  NA 137 132*  133*  K 3.3* 4.2 4.2  CL 103 98 101  CO2 25 19*  --   GLUCOSE 88 59* 50*  BUN '10 18 18  ' CREATININE 0.56 1.25* 1.50*  CALCIUM 8.2* 8.0*  --    GFR: Estimated Creatinine Clearance: 54.2 mL/min (A) (by C-G formula based on SCr of 1.5 mg/dL (H)). Liver Function Tests: Recent Labs  Lab 06/04/18 1618  AST 91*  ALT 91*  ALKPHOS 540*  BILITOT 14.3*  PROT 4.3*  ALBUMIN 1.9*   Recent Labs  Lab 06/04/18 1618  LIPASE 20   Recent Labs  Lab 06/04/18 1618  AMMONIA 28   Coagulation Profile: Recent Labs  Lab 06/04/18 1618  INR 1.30   Cardiac Enzymes: Recent Labs  Lab 06/04/18 1618  TROPONINI 0.04*   BNP (last 3 results) No results for input(s): PROBNP in the last 8760 hours. HbA1C: No results for input(s): HGBA1C in the last 72 hours. CBG: Recent Labs  Lab 06/04/18 1728 06/04/18 1831  GLUCAP 110* 72   Lipid Profile: No results for input(s): CHOL, HDL, LDLCALC, TRIG, CHOLHDL, LDLDIRECT in the last 72 hours. Thyroid Function Tests: No results for input(s): TSH, T4TOTAL, FREET4, T3FREE, THYROIDAB in the last 72 hours. Anemia Panel: No results for input(s): VITAMINB12, FOLATE, FERRITIN, TIBC, IRON, RETICCTPCT in the last 72 hours. Urine analysis:    Component Value Date/Time   COLORURINE AMBER (A) 05/31/2018 1137   APPEARANCEUR CLEAR 05/31/2018 1137   LABSPEC 1.012 05/31/2018 1137   PHURINE 6.0 05/31/2018 1137   GLUCOSEU NEGATIVE 05/31/2018 1137   HGBUR NEGATIVE 05/31/2018 1137   BILIRUBINUR SMALL (A) 05/31/2018 1137   KETONESUR NEGATIVE 05/31/2018 1137   PROTEINUR NEGATIVE 05/31/2018 1137   NITRITE NEGATIVE 05/31/2018 1137   LEUKOCYTESUR SMALL (A) 05/31/2018 1137   Sepsis Labs: No results found for this or any previous visit (from the past 240 hour(s)).   Radiological Exams on Admission: Ct Head Wo Contrast  Result Date: 06/04/2018 CLINICAL DATA:  Altered mental status. Difficulty walking for 2 weeks with slurred speech. Possible medication reaction. EXAM:  CT HEAD WITHOUT CONTRAST TECHNIQUE: Contiguous axial images were obtained from the base of the skull through the vertex without intravenous contrast. COMPARISON:  CT head 05/31/2018. FINDINGS: Brain: There is no evidence of acute intracranial hemorrhage, mass lesion, brain edema or  extra-axial fluid collection. The ventricles and subarachnoid spaces are appropriately sized for age. There is no CT evidence of acute cortical infarction. Vascular:  No hyperdense vessel identified. Skull: Negative for fracture or focal lesion. Prominent scaphocephaly again noted. Sinuses/Orbits: The visualized paranasal sinuses and mastoid air cells are clear. No orbital abnormalities are seen. Other: None. IMPRESSION: Stable head CT without acute intracranial findings. Electronically Signed   By: Richardean Sale M.D.   On: 06/04/2018 17:35   Ct Abdomen Pelvis W Contrast  Result Date: 06/04/2018 CLINICAL DATA:  Painless jaundice, weight loss or fatigue of more than 3 months duration, slurred speech, unable to walk for 2 weeks, worsening symptoms, history asthma, fibromyalgia, gastric paresis, GERD, kidney stones, primary hyperparathyroidism, irritable bowel syndrome, rheumatoid arthritis, smoker EXAM: CT ABDOMEN AND PELVIS WITH CONTRAST TECHNIQUE: Multidetector CT imaging of the abdomen and pelvis was performed using the standard protocol following bolus administration of intravenous contrast. Sagittal and coronal MPR images reconstructed from axial data set. CONTRAST:  180m ISOVUE-300 IOPAMIDOL (ISOVUE-300) INJECTION 61% IV. No oral contrast. COMPARISON:  11/05/2016 FINDINGS: Lower chest: Infiltrate and atelectasis in RIGHT middle lobe. Subsegmental atelectasis lingula Hepatobiliary: Marked fatty infiltration of liver. Gallbladder surgically absent. No biliary dilatation. Pancreas: Normal appearance Spleen: Normal appearance Adrenals/Urinary Tract: BILATERAL nonobstructing renal calculi. Adrenal glands, kidneys, ureters, and  bladder otherwise normal appearance. Small Stomach/Bowel: Normal appendix. Prior gastric bypass surgery with hiatal hernia versus distension of reservoir in the inferior mediastinum. Fluid within the stented esophagus. Few minimally prominent small bowel loops with out evidence of obstruction. Small bowel loops otherwise unremarkable. Significant bowel wall thickening of cecum, ascending colon, and scattered sites at of the mid transverse colon, splenic flexure and descending colon compatible with colitis. Vascular/Lymphatic: Aorta normal caliber with minimal atherosclerotic calcification. No adenopathy. Scattered pelvic phleboliths. Reproductive: Uterus surgically absent. Nonvisualization of ovaries. Other: Low-attenuation free fluid in pelvis. No free air. No hernia. Musculoskeletal: Bones demineralized. IMPRESSION: Patchy areas of colonic wall thickening greatest at ascending colon and cecum consistent with colitis; differential diagnosis includes infection and inflammatory bowel disease, with ischemia considered less likely due to lack of significant vascular disease changes. Prior gastric bypass surgery with either hiatal hernia or distension of pouch and fluid within the distal esophagus. Marked fatty infiltration of liver. Small amount of free pelvic fluid. Electronically Signed   By: MLavonia DanaM.D.   On: 06/04/2018 17:45   Dg Chest Portable 1 View  Result Date: 06/04/2018 CLINICAL DATA:  Hypotension, lethargy, weakness EXAM: PORTABLE CHEST 1 VIEW COMPARISON:  Portable exam 1554 hours compared to 05/22/2018 FINDINGS: Normal heart size mediastinal contours. BILATERAL perihilar interstitial infiltrates question edema versus infection. Minimal subsegmental atelectasis in LEFT upper lobe. Remaining lungs clear. No pleural effusion or pneumothorax. Bones demineralized. IMPRESSION: New perihilar infiltrates question edema versus infection. Electronically Signed   By: MLavonia DanaM.D.   On: 06/04/2018 16:17    UKoreaAbdomen Limited Ruq  Result Date: 06/04/2018 CLINICAL DATA:  Jaundice EXAM: ULTRASOUND ABDOMEN LIMITED RIGHT UPPER QUADRANT COMPARISON:  None FINDINGS: Gallbladder: Gallbladder surgically absent Common bile duct: Diameter: A tubular structure measuring 17 mm in diameter is seen at the porta hepatis. No definite flow is seen within this structure on color Doppler imaging. Based on accompanying CT exam done immediately prior, this does not represent the common bile duct, which measures only 4 mm diameter on CT. Likely this represents the portal vein which has very slow flow not demonstrated by ultrasound; portal vein was opacified by contrast and  patent on CT, not thrombosed. Liver: Increased hepatic echogenicity consistent with the marked fatty infiltration seen on CT. No focal hepatic mass lesion. Portal vein is patent and opacified by CT, with flow detected in the LEFT portal vein on color Doppler imaging but not definitely within the main portal vein question slow flow. No RIGHT upper quadrant free fluid. IMPRESSION: Marked fatty infiltration of liver. Post cholecystectomy. Suspect slow flow within the portal vein, which is patent and enhancing on CT. Electronically Signed   By: Lavonia Dana M.D.   On: 06/04/2018 18:34    EKG: Independently reviewed.  Sinus rhythm at 97 bpm  Assessment/Plan Severe sepsis secondary to suspected colitis: Acute.  Patient presented hypotensive with the BC 13.2, lactic acid elevated to 6.23.  Imaging studies revealed signs of colitis.  Chest x-ray noted to have possible infiltrates suggestive of edema versus infection. Patient was bolused 2.844 L of normal saline IV fluid and started on empiric antibiotics of vancomycin, aztreonam, and Levaquin due to penicillin allergy.  Patient blood pressures were noted to be responsive to initial fluids.  As patient is immunocompromised on Enbrel may need to consider other atypical possible infection with recent travel. - Admit to  stepdown bed at [facility]  - Continue empiric antibiotics Levaquin, aztreonam, and vancomycin - Trend lactic acid levels - May warrant ID consultative service in a.m.   Acute encephalopathy, facial droop with aphasia: Acute.  Patient with complaints of progressive slurred speech with right-sided facial droop.  CT imaging shows no clear signs of stroke and ammonia level within normal limits.  Neurology consulted recommending MRI of the brain or further evaluation at this time. - Neurochecks - Check MRI brain with and without contrast - Appreciate neurology consultative services, will follow-up for further recommendations  Hypoxia: On admission patient noted to have O2 saturations as low as 70s.  Has pulmonary edema and is a - Continuous pulse oximetry overnight with nasal cannula oxygen  -  Albuterol nebs as needed of breath/wheezing  Hyperbilirubinemia, elevated liver enzymes, fatty infiltration of the liver: Acute.  Patient previously evaluated by [facility] with reported negative biopsy of the liver.  Patient significantly jaundiced, but status post cholecystectomy.  Have signs of signs of hypercoagulability.  Acetaminophen and ammonia levels were noted to be within normal limits.  Fatty liver infiltration seen on imaging and no signs of acute obstruction.  Also on differential of infection includes acute ascending cholangitis. - Check ESR  - Appreciate GI consultative services, will follow-up for further recommendation  Coagulopathy: Acute.  Patient found to have mild elevation in PT/INR 16.1/1.3.  D-dimer was elevated at 2.3 with fibrinogen 473, and LDH 247.  Patient does not appear to be in DIC.  - Check haptoglobin  Hypoglycemia: Acute.  Initial glucose 59 on admission.  Patient not on any insulin. - Hypoglycemic protocols  - CBGs every 6 hours discontinue CBG checks when medically appropriate  Acute kidney injury: Patient's baseline creatinine previously noted to be  around 0.5-0.6, blood pressure patient presents with a creatinine of 1.25 with BUN 18.  Suspect patient is intravascularly depleted. - IVFs as tolerated - Recheck BMP -Hold nephrotoxic agents  Rheumatoid arthritis on chronic immunosuppressive therapy: Patient on  Enbrel(last dose on 8/2) and Plaquenil.  -Continue Plaquenil  Hypoalbuminemia: Patient with poor overall protein status is albumin noted to be 1.9 on admission.  This may be likely reason for peripheral edema. - Check prealbumin in a.m. - May benefit from nutrition consult  to increase protein status  Bipolar, general anxiety disorder - Will need to restart home medications when medically appropriate  Hypothyroidism - Check TSH - Continue levothyroxine  History of gastric bypass  DVT prophylaxis: Lovenox  Code Status: Full Family Communication: *Discussed plan of care with the patient and family present at bedside Disposition Plan: To be determined Consults called: GI, neurology, PCCM Admission status:Inpatient  Norval Morton MD [facility] Pager [phone]   If 7PM-7AM, please contact night-coverage [email or web address] Password [facility]  06/04/2018, 9:34 PM

## 2018-06-04 NOTE — Consult Note (Addendum)
.. ..  Name: Jordan Russell MRN: [id number] DOB: [birth year]    ADMISSION DATE:  06/04/2018 CONSULTATION DATE:  06/04/18  REFERRING MD :  EDP  CHIEF COMPLAINT:  Difficulty walking, aphasia and auditory hallucinations   BRIEF PATIENT DESCRIPTION: 47 yr old female with PMHx significant for RA, Bipolar, GAD, Hyperparathyroidism s/p parathyroidectomy, nephrolithiasis s/p ureteral stenting, GERD s/p gastric bypass surgery presents to [facility] with complaints of progressive decline in neurological status over 3-6 months and now has developed focal weakness, right sided facial droop and slurred speech. CTH negative. Pt was initially hypotensive but was fluid responsive. PCCM consulted due to elevated lactic 6.23 in hemodynamically stable pt.  SIGNIFICANT EVENTS  Change in mental status with focal deficits   STUDIES:  CTH- negative for acute pathology CT A/P: Patchy areas of colonic wall thickening greatest at ascending colon and cecum consistent with colitis; differential diagnosis includes infection and inflammatory bowel disease, with ischemia considered less likely due to lack of significant vascular disease changes.  Prior gastric bypass surgery with either hiatal hernia or distension of pouch and fluid within the distal esophagus. Marked fatty infiltration of liver. Small amount of free pelvic fluid.  SIGNIFICANT LABS: WBC: 13.4 left shift on diff.  Hgb 12.8 Hct 38.2 LDH 247  Fibrinogen 473 D-dimer 2.3 INR 1.3 no schistocytes on smear Lactate 6.23 Na 132 K= 4.2 HCO3- 19 CL 98 AG 15 Glucose 50 Bili direct 8.9 Tbili 14.3 Corrected Calcium 9.7 Alk phos 540 AST 91 ALT 91 Trop 0.04  HISTORY OF PRESENT ILLNESS:  47 yr old female with PMHx significant for RA, Bipolar, GAD, Hyperparathyroidism s/p parathyroidectomy, nephrolithiasis s/p ureteral stenting, s/p cholecystectomy,GERD s/p gastric bypass surgery with gastroparesis presents to [facility] with complaints of progressive decline in neurological  status over 3-6 months and now has developed focal weakness, right sided facial droop and slurred speech. CTH negative. Pt was initially hypotensive but was fluid responsive. PCCM consulted due to elevated lactic 6.23    PAST MEDICAL HISTORY :   has a past medical history of Asthma, Bipolar 1 disorder ([place]), Fibromyalgia, GAD (generalized anxiety disorder), Gastroparesis, GERD (gastroesophageal reflux disease), Hiatal hernia, History of kidney stones, History of primary hyperparathyroidism, IBS (irritable bowel syndrome), Left ureteral stone, Migraine, PONV (postoperative nausea and vomiting), RA (rheumatoid arthritis) ([place]), Self mutilating behavior, Thyroid disease, Urgency of urination, and Wears glasses.  has a past surgical history that includes Knee arthroscopy (Right, 03-04-2014  [facility]); Esophagogastroduodenoscopy (egd) with propofol (N/A, 10/01/2014); Balloon dilation (N/A, 10/01/2014); Esophageal manometry (N/A, 12/23/2014); Laparoscopic cholecystectomy (01/17/2004); Tonsillectomy and adenoidectomy; Wrist ganglion excision (Right, 01/28/2000); CYSTO/  BILATERAL RETROGRADE PYELOGRAM (11/20/2000); LEFT URETEROSCOPIC STONE EXTRACTION /  STENT PLACEMENT (07/09/2002); LAPAROSCOPY LEFT OVARIAN CYSTECTOMY/  BILATERAL TUBAL LIGATION (02/26/2003); Laparoscopic assisted vaginal hysterectomy (12/28/2005); CYSTO/  RIGHT RETROGRADE PYELOGRAM/  RIGHT URETEROSCOPY/  STENT PLACEMENT (12/16/2006); SOLYX TRANSURETHRAL SLING/  POSTERIOR PELVIC FLOOR SACROSPINOUS REPAIR (02/21/2009); RIGHT URETERAL DILATION/  URETEROSCOPIC STONE EXTRACTION (06/12/2010); Roux-en-Y Gastric Bypass (2001); NECK EXPLORATION/  BILATERAL INFERIOR PARATHYROIDECTOMY (08/03/2010); Esophagogastroduodenoscopy (last one 03-28-2015); Extracorporeal shock wave lithotripsy (multiple times since age 89); Cystoscopy/ureteroscopy/holmium laser/stent placement (Left, 11/09/2016); Holmium laser application (Left, 12/10/6379); and Abdominal hysterectomy. Prior to  Admission medications   Medication Sig Start Date End Date Taking? Authorizing Provider  acetaminophen (TYLENOL) 500 MG tablet Take 1,000 mg by mouth every 6 (six) hours as needed for headache.    Yes [provider]  albuterol (PROVENTIL) (2.5 MG/3ML) 0.083% nebulizer solution Take 3 mLs (2.5 mg total) by nebulization every 4 (  four) hours as needed for wheezing or shortness of breath. 09/20/14  Yes Malvin Johns, MD  ALPRAZolam Duanne Moron) 1 MG tablet Take 1 mg by mouth 4 (four) times daily as needed for anxiety.    Yes [provider]  buPROPion (WELLBUTRIN SR) 100 MG 12 hr tablet Take 100 mg by mouth 3 (three) times daily. AM, LUNCH, 1600    Yes [provider]  cetirizine (ZYRTEC) 10 MG tablet Take 10 mg by mouth daily.   Yes [provider]  cyanocobalamin (,VITAMIN B-12,) 1000 MCG/ML injection Inject 1,000 mcg every 30 (thirty) days into the muscle. 07/28/17  Yes [provider]  etanercept (ENBREL) 50 MG/ML injection Inject 50 mg into the skin once a week. friday's   Yes [provider]  hydroxychloroquine (PLAQUENIL) 200 MG tablet Take 200 mg by mouth 2 (two) times daily.   Yes [provider]  LATUDA 80 MG TABS tablet Take 80 mg by mouth at bedtime.  08/27/17  Yes [provider]  levothyroxine (SYNTHROID, LEVOTHROID) 50 MCG tablet Take 50 mcg by mouth daily before breakfast.   Yes [provider]  lubiprostone (AMITIZA) 24 MCG capsule Take 24 mcg by mouth 2 (two) times daily with a meal.   Yes [provider]  naproxen (NAPROSYN) 500 MG tablet Take 500 mg by mouth 2 (two) times daily with a meal.   Yes [provider]  omeprazole (PRILOSEC) 20 MG capsule Take 20 mg by mouth 2 (two) times daily before a meal.   Yes [provider]  promethazine (PHENERGAN) 25 MG tablet Take 25 mg by mouth every 6 (six) hours as needed for nausea or vomiting.   Yes [provider]  QUEtiapine  (SEROQUEL) 300 MG tablet Take 600 mg by mouth at bedtime.   Yes [provider]  sodium chloride (OCEAN) 0.65 % SOLN nasal spray Place 1 spray into both nostrils as needed for congestion.   Yes [provider]  topiramate (TOPAMAX) 25 MG capsule Take 50 mg by mouth 2 (two) times daily.  12/10/14  Yes [provider]  potassium chloride SA (K-DUR,KLOR-CON) 20 MEQ tablet Take 1 tablet (20 mEq total) by mouth 2 (two) times daily for 4 days. 05/22/18 05/26/18  Long, Wonda Olds, MD   Allergies  Allergen Reactions  . Sulfasalazine Hives  . Lamictal [Lamotrigine] Nausea And Vomiting and Other (See Comments)    Tremors, diplopia  . Penicillins Hives and Itching    As a child, "breathing, itching problem" Has patient had a PCN reaction causing immediate rash, facial/tongue/throat swelling, SOB or lightheadedness with hypotension: Yes Has patient had a PCN reaction causing severe rash involving mucus membranes or skin necrosis: Yes Has patient had a PCN reaction that required hospitalization No Has patient had a PCN reaction occurring within the last 10 years: No If all of the above answers are "NO", then may proceed with Cephalosporin use.   . Sulfa Antibiotics Hives  . Morphine And Related Itching and Rash    FAMILY HISTORY:  family history includes Anxiety disorder in her brother; Bipolar disorder in her son; Breast cancer in her maternal aunt, maternal grandmother, mother, and sister; Depression in her brother. SOCIAL HISTORY:  reports that she has been smoking cigarettes.  She has a 5.00 pack-year smoking history. She has never used smokeless tobacco. She reports that she drinks alcohol. She reports that she does not use drugs.  REVIEW OF SYSTEMS:  (pertinent positives are bolded) Constitutional: Negative for  fever, chills, weight loss, malaise/fatigue and diaphoresis.  HENT: Negative for hearing loss, ear pain, nosebleeds, congestion, sore throat, neck pain, tinnitus and  ear discharge.   Eyes: Negative for blurred vision, double vision, comments that she can no longer see out of her glasses, photophobia, pain, discharge and redness.  Respiratory: Negative for cough, hemoptysis, sputum production, shortness of breath, wheezing and stridor.   Cardiovascular:left sided rib pain where she fell, chest pain, palpitations, orthopnea, claudication, leg swelling and PND.  Gastrointestinal: Negative for heartburn, nausea, vomiting, abdominal pain, diarrhea, constipation, blood in stool and melena.  Genitourinary: Negative for dysuria, urgency, darkening of urine, frequency, hematuria and flank pain.  Musculoskeletal: Negative for myalgias, back pain, joint pain and falls.  Skin: Negative for itching ( was given prednisone as an outpt for it) and rash.  Neurological: Negative for dizziness, tingling, tremors, sensory change, speech change, focal weakness, seizures, loss of consciousness, weakness and headaches.  Endo/Heme/Allergies: Negative for environmental allergies and polydipsia. Does not bruise/bleed easily.  SUBJECTIVE:   VITAL SIGNS: Temp:  [96.9 F (36.1 C)-97.7 F (36.5 C)] 96.9 F (36.1 C) (08/04 1632) Pulse Rate:  [99-110] 99 (08/04 1703) Resp:  [17-22] 19 (08/04 1900) BP: (72-106)/(50-76) 106/50 (08/04 1900) SpO2:  [87 %-94 %] 87 % (08/04 1600) Weight:  [94.8 kg (209 lb)] 94.8 kg (209 lb) (08/04 1511)  PHYSICAL EXAMINATION: General:  Weak, alert awake Neuro:  3/5 strength in lower extremities, 4/5 in b/l upper extremities, CN 2-12 intact, EOM intact reports diplopia on upward and downward gaze portion of exam HEENT:  NCAT, temporal wasting, dry oral mucosa Cardiovascular:  S1 and S2 no rub no gallop Lungs:  Good air entry decreased at bases Abdomen:  Soft, obese abdomen with flank fullness + fluid wave on exam diffusely tender on palpation negative murphy Musculoskeletal:  +3 pitting edema up to the knees Skin:  Grossly intact  Recent Labs  Lab  05/31/18 1008 06/04/18 1618 06/04/18 1646  NA 137 132* 133*  K 3.3* 4.2 4.2  CL 103 98 101  CO2 25 19*  --   BUN '10 18 18  ' CREATININE 0.56 1.25* 1.50*  GLUCOSE 88 59* 50*   Recent Labs  Lab 05/31/18 1008 06/04/18 1618 06/04/18 1646  HGB 12.6 12.8 12.2  HCT 36.3 38.2 36.0  WBC 10.0 13.4*  --   PLT 225 158  158  --    Ct Head Wo Contrast  Result Date: 06/04/2018 CLINICAL DATA:  Altered mental status. Difficulty walking for 2 weeks with slurred speech. Possible medication reaction. EXAM: CT HEAD WITHOUT CONTRAST TECHNIQUE: Contiguous axial images were obtained from the base of the skull through the vertex without intravenous contrast. COMPARISON:  CT head 05/31/2018. FINDINGS: Brain: There is no evidence of acute intracranial hemorrhage, mass lesion, brain edema or extra-axial fluid collection. The ventricles and subarachnoid spaces are appropriately sized for age. There is no CT evidence of acute cortical infarction. Vascular:  No hyperdense vessel identified. Skull: Negative for fracture or focal lesion. Prominent scaphocephaly again noted. Sinuses/Orbits: The visualized paranasal sinuses and mastoid air cells are clear. No orbital abnormalities are seen. Other: None. IMPRESSION: Stable head CT without acute intracranial findings. Electronically Signed   By: Richardean Sale M.D.   On: 06/04/2018 17:35   Ct Abdomen Pelvis W Contrast  Result Date: 06/04/2018 CLINICAL DATA:  Painless jaundice, weight loss or fatigue of more than 3 months duration, slurred speech, unable to walk for 2 weeks, worsening symptoms, history asthma, fibromyalgia, gastric  paresis, GERD, kidney stones, primary hyperparathyroidism, irritable bowel syndrome, rheumatoid arthritis, smoker EXAM: CT ABDOMEN AND PELVIS WITH CONTRAST TECHNIQUE: Multidetector CT imaging of the abdomen and pelvis was performed using the standard protocol following bolus administration of intravenous contrast. Sagittal and coronal MPR images  reconstructed from axial data set. CONTRAST:  133m ISOVUE-300 IOPAMIDOL (ISOVUE-300) INJECTION 61% IV. No oral contrast. COMPARISON:  11/05/2016 FINDINGS: Lower chest: Infiltrate and atelectasis in RIGHT middle lobe. Subsegmental atelectasis lingula Hepatobiliary: Marked fatty infiltration of liver. Gallbladder surgically absent. No biliary dilatation. Pancreas: Normal appearance Spleen: Normal appearance Adrenals/Urinary Tract: BILATERAL nonobstructing renal calculi. Adrenal glands, kidneys, ureters, and bladder otherwise normal appearance. Small Stomach/Bowel: Normal appendix. Prior gastric bypass surgery with hiatal hernia versus distension of reservoir in the inferior mediastinum. Fluid within the stented esophagus. Few minimally prominent small bowel loops with out evidence of obstruction. Small bowel loops otherwise unremarkable. Significant bowel wall thickening of cecum, ascending colon, and scattered sites at of the mid transverse colon, splenic flexure and descending colon compatible with colitis. Vascular/Lymphatic: Aorta normal caliber with minimal atherosclerotic calcification. No adenopathy. Scattered pelvic phleboliths. Reproductive: Uterus surgically absent. Nonvisualization of ovaries. Other: Low-attenuation free fluid in pelvis. No free air. No hernia. Musculoskeletal: Bones demineralized. IMPRESSION: Patchy areas of colonic wall thickening greatest at ascending colon and cecum consistent with colitis; differential diagnosis includes infection and inflammatory bowel disease, with ischemia considered less likely due to lack of significant vascular disease changes. Prior gastric bypass surgery with either hiatal hernia or distension of pouch and fluid within the distal esophagus. Marked fatty infiltration of liver. Small amount of free pelvic fluid. Electronically Signed   By: MLavonia DanaM.D.   On: 06/04/2018 17:45   Dg Chest Portable 1 View  Result Date: 06/04/2018 CLINICAL DATA:  Hypotension,  lethargy, weakness EXAM: PORTABLE CHEST 1 VIEW COMPARISON:  Portable exam 1554 hours compared to 05/22/2018 FINDINGS: Normal heart size mediastinal contours. BILATERAL perihilar interstitial infiltrates question edema versus infection. Minimal subsegmental atelectasis in LEFT upper lobe. Remaining lungs clear. No pleural effusion or pneumothorax. Bones demineralized. IMPRESSION: New perihilar infiltrates question edema versus infection. Electronically Signed   By: MLavonia DanaM.D.   On: 06/04/2018 16:17   UKoreaAbdomen Limited Ruq  Result Date: 06/04/2018 CLINICAL DATA:  Jaundice EXAM: ULTRASOUND ABDOMEN LIMITED RIGHT UPPER QUADRANT COMPARISON:  None FINDINGS: Gallbladder: Gallbladder surgically absent Common bile duct: Diameter: A tubular structure measuring 17 mm in diameter is seen at the porta hepatis. No definite flow is seen within this structure on color Doppler imaging. Based on accompanying CT exam done immediately prior, this does not represent the common bile duct, which measures only 4 mm diameter on CT. Likely this represents the portal vein which has very slow flow not demonstrated by ultrasound; portal vein was opacified by contrast and patent on CT, not thrombosed. Liver: Increased hepatic echogenicity consistent with the marked fatty infiltration seen on CT. No focal hepatic mass lesion. Portal vein is patent and opacified by CT, with flow detected in the LEFT portal vein on color Doppler imaging but not definitely within the main portal vein question slow flow. No RIGHT upper quadrant free fluid. IMPRESSION: Marked fatty infiltration of liver. Post cholecystectomy. Suspect slow flow within the portal vein, which is patent and enhancing on CT. Electronically Signed   By: MLavonia DanaM.D.   On: 06/04/2018 18:34    ASSESSMENT / Recommendations: 1. Sepsis secondary to intraabdominal source: pt on Etanercept (TNFalpha blockade) and hydroxychloroquine technically immunosuppressed. At risk  for  oppurtunistic/fungal infections. Not in shock fluid responsive. No role for vasopressors at this time. Continue broad spectrum antibiotics. check PCT.Trend WBC, fever curve.   2. Obstructive Jaundice: obstruction secondary to ???? Needs further investigation. Pt has a significant smoking history and is on Etanercept, is there an undiagnosed malignancy. Pt does not have a significant ETOH history. Fatty liver on scan. NAFLD progressing to end stage liver dz? MELD 23 pts. May benefit from 25% albumin infusion.  3, Hyperlactate vs Lactic acidosis- this does not appear to be Type A lactic acidosis which is secondary to hypoperfusion.  Pt does have fatty infiltration of the liver which may explain decreased lactate clearance. Continue to trend lactate.  4. Hyperbilirubinemia with elevated LDH and high Alkphos -> check a haptoglobin to rule out intravascular hemolysis. D-dimer is not a very specific marker multiple etiologies can cause elevation. No schistocytes on smear and fibrinogen is normal which rules out DIC.   5. Progressive weakness and waxing mental status in a patient with autoimmune history and on Enbrel- consult to neurology pt needs an LP and an MRI  6. CTA/P findings-concerning for colitis continue on antibiotic coverage for gram neg and anaerobes.  This patient does not currently require ICU monitoring or management  I, Dr Seward Carol have personally reviewed patient's available data, including medical history, events of note, physical examination and test results as part of my evaluation. I have discussed with NP and other care providers such as pharmacist, RN and Elink. The patient is ill and requires admission for the assessment of multiple organ systems being affected and requires high complexity decision making for assessment and support, frequent evaluation and titration of therapies, application of advanced monitoring technologies and extensive interpretation of multiple  databases. Critical Care Consult Time devoted to patient care services described in this note is 62 Minutes. This time reflects time of care of this signee Dr Seward Carol. This critical care time does not reflect procedure time, or teaching time or supervisory time but could involve care discussion time    DISPOSITION:does not require ICU admission hemodynamically stable. Please reconsult if pt's clincial condition worsens CC TIME: 55 minutes PROGNOSIS: Guarded FAMILY: husband at bedside CODE STATUS: Full   Signed Dr Seward Carol Pulmonary Critical Care Locums  06/04/2018, 7:46 PM

## 2018-06-04 NOTE — Progress Notes (Signed)
A consult was received from an ED physician for Levaquin, Aztreonam, and Vancomycin per pharmacy dosing.  The patient's profile has been reviewed for ht/wt/allergies/indication/available labs.   A one time order has been placed for Levaquin 750 mg IV, Aztreonam 2g IV, and Vancomycin 1500 mg IV.  Further antibiotics/pharmacy consults should be ordered by admitting physician if indicated.                       Thank you, Gretta Arab PharmD, BCPS Pager [phone] 06/04/2018 5:17 PM

## 2018-06-04 NOTE — ED Notes (Signed)
CRITICAL VALUE STICKER  CRITICAL VALUE: Lactic 5.1; trop 0.04  RECEIVER (on-site recipient of call): Maylon Cos T RN  [place] NOTIFIED: 06/04/18 2344  MESSENGER (representative from lab): Blanch Media  MD aware  RESPONSE: awaiting stepdown bed at [facility].

## 2018-06-04 NOTE — ED Notes (Signed)
She looks more energetic, and tell me she feels better. She is conversing quite coherently, and even joking with her family.

## 2018-06-04 NOTE — ED Notes (Signed)
Main lab called to collect [facility] x2

## 2018-06-04 NOTE — ED Notes (Signed)
Patient transported to CT 

## 2018-06-04 NOTE — ED Notes (Signed)
Note: we were only able to obtain one blood culture. I do not delay antibiotic therapy, and initiate at this time.

## 2018-06-04 NOTE — ED Notes (Signed)
EDP Long notified of Lactic Acid 6.23. RN Octavia Bruckner also made aware.

## 2018-06-04 NOTE — ED Notes (Signed)
Admitting Provider at bedside. 

## 2018-06-05 ENCOUNTER — Encounter (HOSPITAL_COMMUNITY): Payer: Self-pay | Admitting: Radiology

## 2018-06-05 ENCOUNTER — Other Ambulatory Visit: Payer: Self-pay

## 2018-06-05 ENCOUNTER — Inpatient Hospital Stay (HOSPITAL_COMMUNITY): Payer: 59

## 2018-06-05 DIAGNOSIS — K760 Fatty (change of) liver, not elsewhere classified: Secondary | ICD-10-CM | POA: Diagnosis present

## 2018-06-05 DIAGNOSIS — R0902 Hypoxemia: Secondary | ICD-10-CM

## 2018-06-05 DIAGNOSIS — G9340 Encephalopathy, unspecified: Secondary | ICD-10-CM | POA: Diagnosis present

## 2018-06-05 DIAGNOSIS — N179 Acute kidney failure, unspecified: Secondary | ICD-10-CM

## 2018-06-05 DIAGNOSIS — E43 Unspecified severe protein-calorie malnutrition: Secondary | ICD-10-CM

## 2018-06-05 HISTORY — DX: Acute kidney failure, unspecified: N17.9

## 2018-06-05 HISTORY — DX: Other disorders of bilirubin metabolism: E80.6

## 2018-06-05 HISTORY — DX: Fatty (change of) liver, not elsewhere classified: K76.0

## 2018-06-05 HISTORY — DX: Hypoxemia: R09.02

## 2018-06-05 LAB — COMPREHENSIVE METABOLIC PANEL
ALT: 63 U/L — ABNORMAL HIGH (ref 0–44)
AST: 61 U/L — ABNORMAL HIGH (ref 15–41)
Albumin: 1.7 g/dL — ABNORMAL LOW (ref 3.5–5.0)
Alkaline Phosphatase: 358 U/L — ABNORMAL HIGH (ref 38–126)
Anion gap: 11 (ref 5–15)
BILIRUBIN TOTAL: 12.2 mg/dL — AB (ref 0.3–1.2)
BUN: 19 mg/dL (ref 6–20)
CHLORIDE: 103 mmol/L (ref 98–111)
CO2: 19 mmol/L — ABNORMAL LOW (ref 22–32)
CREATININE: 1.46 mg/dL — AB (ref 0.44–1.00)
Calcium: 7.4 mg/dL — ABNORMAL LOW (ref 8.9–10.3)
GFR calc non Af Amer: 41 mL/min — ABNORMAL LOW (ref >60)
GFR, EST AFRICAN AMERICAN: 48 mL/min — AB (ref >60)
Glucose, Bld: 75 mg/dL (ref 70–99)
POTASSIUM: 4.3 mmol/L (ref 3.5–5.1)
Sodium: 133 mmol/L — ABNORMAL LOW (ref 135–145)
TOTAL PROTEIN: 3.5 g/dL — AB (ref 6.5–8.1)

## 2018-06-05 LAB — BLOOD CULTURE ID PANEL (REFLEXED)
ACINETOBACTER BAUMANNII: NOT DETECTED
CANDIDA ALBICANS: NOT DETECTED
CARBAPENEM RESISTANCE: NOT DETECTED
Candida glabrata: NOT DETECTED
Candida krusei: NOT DETECTED
Candida parapsilosis: NOT DETECTED
Candida tropicalis: NOT DETECTED
ENTEROBACTER CLOACAE COMPLEX: NOT DETECTED
ENTEROBACTERIACEAE SPECIES: DETECTED — AB
ENTEROCOCCUS SPECIES: NOT DETECTED
Escherichia coli: DETECTED — AB
Haemophilus influenzae: NOT DETECTED
Klebsiella oxytoca: NOT DETECTED
Klebsiella pneumoniae: NOT DETECTED
LISTERIA MONOCYTOGENES: NOT DETECTED
NEISSERIA MENINGITIDIS: NOT DETECTED
PSEUDOMONAS AERUGINOSA: NOT DETECTED
Proteus species: NOT DETECTED
STAPHYLOCOCCUS AUREUS BCID: NOT DETECTED
STREPTOCOCCUS AGALACTIAE: NOT DETECTED
STREPTOCOCCUS PYOGENES: NOT DETECTED
STREPTOCOCCUS SPECIES: NOT DETECTED
Serratia marcescens: NOT DETECTED
Staphylococcus species: NOT DETECTED
Streptococcus pneumoniae: NOT DETECTED

## 2018-06-05 LAB — CBG MONITORING, ED: GLUCOSE-CAPILLARY: 78 mg/dL (ref 70–99)

## 2018-06-05 LAB — GLUCOSE, CAPILLARY
GLUCOSE-CAPILLARY: 77 mg/dL (ref 70–99)
GLUCOSE-CAPILLARY: 94 mg/dL (ref 70–99)
Glucose-Capillary: 80 mg/dL (ref 70–99)
Glucose-Capillary: 82 mg/dL (ref 70–99)

## 2018-06-05 LAB — HIV ANTIBODY (ROUTINE TESTING W REFLEX): HIV Screen 4th Generation wRfx: NONREACTIVE

## 2018-06-05 LAB — TSH: TSH: 7.063 u[IU]/mL — ABNORMAL HIGH (ref 0.350–4.500)

## 2018-06-05 LAB — MRSA PCR SCREENING: MRSA by PCR: NEGATIVE

## 2018-06-05 LAB — GAMMA GT: GGT: 209 U/L — ABNORMAL HIGH (ref 7–50)

## 2018-06-05 LAB — SEDIMENTATION RATE: Sed Rate: 5 mm/hr (ref 0–22)

## 2018-06-05 MED ORDER — TOPIRAMATE 25 MG PO TABS
50.0000 mg | ORAL_TABLET | Freq: Two times a day (BID) | ORAL | Status: DC
  Administered 2018-06-05 – 2018-06-07 (×6): 50 mg via ORAL
  Filled 2018-06-05 (×6): qty 2

## 2018-06-05 MED ORDER — SODIUM CHLORIDE 0.9 % IV SOLN
INTRAVENOUS | Status: DC | PRN
  Administered 2018-06-05: 1000 mL via INTRAVENOUS

## 2018-06-05 MED ORDER — LEVOTHYROXINE SODIUM 50 MCG PO TABS
50.0000 ug | ORAL_TABLET | Freq: Every day | ORAL | Status: DC
  Administered 2018-06-05 – 2018-06-07 (×3): 50 ug via ORAL
  Filled 2018-06-05 (×4): qty 1

## 2018-06-05 MED ORDER — HYDROXYCHLOROQUINE SULFATE 200 MG PO TABS
200.0000 mg | ORAL_TABLET | Freq: Two times a day (BID) | ORAL | Status: DC
  Administered 2018-06-05 – 2018-06-06 (×3): 200 mg via ORAL
  Filled 2018-06-05 (×3): qty 1

## 2018-06-05 MED ORDER — ALPRAZOLAM 0.5 MG PO TABS
1.0000 mg | ORAL_TABLET | Freq: Four times a day (QID) | ORAL | Status: DC | PRN
  Administered 2018-06-05: 1 mg via ORAL
  Filled 2018-06-05: qty 2

## 2018-06-05 MED ORDER — DEXTROSE-NACL 5-0.45 % IV SOLN
INTRAVENOUS | Status: DC
  Administered 2018-06-05: 03:00:00 via INTRAVENOUS

## 2018-06-05 MED ORDER — PANTOPRAZOLE SODIUM 40 MG PO TBEC
40.0000 mg | DELAYED_RELEASE_TABLET | Freq: Two times a day (BID) | ORAL | Status: DC
  Administered 2018-06-05 – 2018-06-07 (×6): 40 mg via ORAL
  Filled 2018-06-05 (×6): qty 1

## 2018-06-05 MED ORDER — SODIUM CHLORIDE 0.9 % IV BOLUS
500.0000 mL | Freq: Once | INTRAVENOUS | Status: AC
  Administered 2018-06-05: 500 mL via INTRAVENOUS

## 2018-06-05 MED ORDER — ENSURE ENLIVE PO LIQD
237.0000 mL | Freq: Two times a day (BID) | ORAL | Status: DC
  Administered 2018-06-05: 237 mL via ORAL

## 2018-06-05 MED ORDER — QUETIAPINE FUMARATE 300 MG PO TABS
600.0000 mg | ORAL_TABLET | Freq: Every day | ORAL | Status: DC
  Administered 2018-06-05 – 2018-06-07 (×3): 600 mg via ORAL
  Filled 2018-06-05 (×3): qty 2

## 2018-06-05 MED ORDER — GADOBENATE DIMEGLUMINE 529 MG/ML IV SOLN
20.0000 mL | Freq: Once | INTRAVENOUS | Status: AC | PRN
  Administered 2018-06-05: 20 mL via INTRAVENOUS

## 2018-06-05 MED ORDER — SODIUM CHLORIDE 0.9 % IV SOLN
INTRAVENOUS | Status: DC
  Administered 2018-06-05: 02:00:00 via INTRAVENOUS

## 2018-06-05 MED ORDER — BOOST / RESOURCE BREEZE PO LIQD CUSTOM
1.0000 | Freq: Three times a day (TID) | ORAL | Status: DC
Start: 2018-06-05 — End: 2018-06-09
  Administered 2018-06-05 – 2018-06-07 (×6): 1 via ORAL

## 2018-06-05 MED ORDER — ALBUMIN HUMAN 25 % IV SOLN
12.5000 g | Freq: Once | INTRAVENOUS | Status: AC
  Administered 2018-06-05: 12.5 g via INTRAVENOUS
  Filled 2018-06-05: qty 50

## 2018-06-05 NOTE — Consult Note (Signed)
Referring Provider: Dr. Tamala Julian Primary Care Physician:  Josetta Huddle, MD Primary Gastroenterologist:  Dr. Therisa Doyne  Reason for Consultation:  Jaundice  HPI: Jordan Russell is a 47 y.o. female with multiple medical problems admitted for hypotension and neurologic changes with negative head MRI seen for a consult due to elevated LFTs who has had elevated LFTs in the past and had a liver biopsy in June that did not show fibrosis. Findings showed minimal non-specific changes. She has had a prior liver biopsy 2 years ago and has chronically had elevated LFTs. She is on multiple meds including Plaquenil and Enbrel for RA. Has been having diffuse pruritus for over a month along with fatigue. Chronic LE edema. Has had left-sided mid-quadrant abdominal pain as well as RUQ tenderness that occurs at different times. Rare alcohol. TB 14.3 (DB 8.9), ALP 540, AST 91, ALT 91, Albumin 1.9. AMA and ANA negative as an outpt. CT showed patchy areas of colonic wall thickening worse in the proximal colon. Reports history of gastroparesis and s/p gastric bypass. Recently back from a cruise. Family at bedside.  Past Medical History:  Diagnosis Date  . Asthma   . Bipolar 1 disorder ([place])   . Fibromyalgia   . GAD (generalized anxiety disorder)   . Gastroparesis   . GERD (gastroesophageal reflux disease)   . Hiatal hernia   . History of kidney stones   . History of primary hyperparathyroidism    s/p  bilateral inferior parathyroidectomy 08-03-2010  . IBS (irritable bowel syndrome)   . Left ureteral stone   . Migraine   . PONV (postoperative nausea and vomiting)    and claustrophobic with mask  . RA (rheumatoid arthritis) ([place])    dr Gavin Pound-- rheumtologist  . Self mutilating behavior   . Thyroid disease   . Urgency of urination   . Wears glasses     Past Surgical History:  Procedure Laterality Date  . ABDOMINAL HYSTERECTOMY    . BALLOON DILATION N/A 10/01/2014   Procedure: BALLOON DILATION;   Surgeon: Garlan Fair, MD;  Location: [facility];  Service: Endoscopy;  Laterality: N/A;  . CYSTO/  BILATERAL RETROGRADE PYELOGRAM  11/20/2000  . CYSTO/  RIGHT RETROGRADE PYELOGRAM/  RIGHT URETEROSCOPY/  STENT PLACEMENT  12/16/2006  . CYSTOSCOPY/URETEROSCOPY/HOLMIUM LASER/STENT PLACEMENT Left 11/09/2016   Procedure: CYSTOSCOPY/URETEROSCOPY/HOLMIUM LASER/STENT PLACEMENT;  Surgeon: Nickie Retort, MD;  Location: [facility];  Service: Urology;  Laterality: Left;  90 MINS  [phone]   . ESOPHAGEAL MANOMETRY N/A 12/23/2014   Procedure: ESOPHAGEAL MANOMETRY (EM);  Surgeon: Garlan Fair, MD;  Location: [facility];  Service: Endoscopy;  Laterality: N/A;  . ESOPHAGOGASTRODUODENOSCOPY  last one 03-28-2015  . ESOPHAGOGASTRODUODENOSCOPY (EGD) WITH PROPOFOL N/A 10/01/2014   Procedure: ESOPHAGOGASTRODUODENOSCOPY (EGD) WITH PROPOFOL;  Surgeon: Garlan Fair, MD;  Location: [facility];  Service: Endoscopy;  Laterality: N/A;  . EXTRACORPOREAL SHOCK WAVE LITHOTRIPSY  multiple times since age 2  . HOLMIUM LASER APPLICATION Left 9/0/2409   Procedure: HOLMIUM LASER APPLICATION;  Surgeon: Nickie Retort, MD;  Location: [facility];  Service: Urology;  Laterality: Left;  . KNEE ARTHROSCOPY Right 03-04-2014  [facility]   w/ Arthrotomy  . LAPAROSCOPIC ASSISTED VAGINAL HYSTERECTOMY  12/28/2005  . LAPAROSCOPIC CHOLECYSTECTOMY  01/17/2004  . LAPAROSCOPY LEFT OVARIAN CYSTECTOMY/  BILATERAL TUBAL LIGATION  02/26/2003  . LEFT URETEROSCOPIC STONE EXTRACTION /  STENT PLACEMENT  07/09/2002  . NECK EXPLORATION/  BILATERAL INFERIOR PARATHYROIDECTOMY  08/03/2010  . RIGHT URETERAL DILATION/  URETEROSCOPIC STONE EXTRACTION  06/12/2010  . ROUX-EN-Y GASTRIC BYPASS  2001  . SOLYX TRANSURETHRAL SLING/  POSTERIOR PELVIC FLOOR SACROSPINOUS REPAIR  02/21/2009   and Cysto/  Bilateral ureteral stent placement  . TONSILLECTOMY AND ADENOIDECTOMY    . WRIST GANGLION EXCISION Right  01/28/2000    Prior to Admission medications   Medication Sig Start Date End Date Taking? Authorizing Provider  acetaminophen (TYLENOL) 500 MG tablet Take 1,000 mg by mouth every 6 (six) hours as needed for headache.    Yes [provider]  albuterol (PROVENTIL) (2.5 MG/3ML) 0.083% nebulizer solution Take 3 mLs (2.5 mg total) by nebulization every 4 (four) hours as needed for wheezing or shortness of breath. 09/20/14  Yes Malvin Johns, MD  ALPRAZolam Duanne Moron) 1 MG tablet Take 1 mg by mouth 4 (four) times daily as needed for anxiety.    Yes [provider]  buPROPion (WELLBUTRIN SR) 100 MG 12 hr tablet Take 100 mg by mouth 3 (three) times daily. AM, LUNCH, 1600    Yes [provider]  cetirizine (ZYRTEC) 10 MG tablet Take 10 mg by mouth daily.   Yes [provider]  cyanocobalamin (,VITAMIN B-12,) 1000 MCG/ML injection Inject 1,000 mcg every 30 (thirty) days into the muscle. 07/28/17  Yes [provider]  etanercept (ENBREL) 50 MG/ML injection Inject 50 mg into the skin once a week. friday's   Yes [provider]  hydroxychloroquine (PLAQUENIL) 200 MG tablet Take 200 mg by mouth 2 (two) times daily.   Yes [provider]  LATUDA 80 MG TABS tablet Take 80 mg by mouth at bedtime.  08/27/17  Yes [provider]  levothyroxine (SYNTHROID, LEVOTHROID) 50 MCG tablet Take 50 mcg by mouth daily before breakfast.   Yes [provider]  lubiprostone (AMITIZA) 24 MCG capsule Take 24 mcg by mouth 2 (two) times daily with a meal.   Yes [provider]  naproxen (NAPROSYN) 500 MG tablet Take 500 mg by mouth 2 (two) times daily with a meal.   Yes [provider]  omeprazole (PRILOSEC) 20 MG capsule Take 20 mg by mouth 2 (two) times daily before a meal.   Yes [provider]  promethazine (PHENERGAN) 25 MG tablet Take 25 mg by mouth every 6 (six) hours as needed for nausea or vomiting.   Yes [provider]  QUEtiapine (SEROQUEL) 300 MG tablet Take 600 mg by mouth at bedtime.   Yes [provider]  sodium chloride (OCEAN) 0.65 % SOLN nasal spray Place 1 spray into both nostrils as needed for congestion.   Yes [provider]  topiramate (TOPAMAX) 25 MG capsule Take 50 mg by mouth 2 (two) times daily.  12/10/14  Yes [provider]  potassium chloride SA (K-DUR,KLOR-CON) 20 MEQ tablet Take 1 tablet (20 mEq total) by mouth 2 (two) times daily for 4 days. 05/22/18 05/26/18  Margette Fast, MD    Scheduled Meds: . enoxaparin (LOVENOX) injection  40 mg Subcutaneous QHS  . feeding supplement (ENSURE ENLIVE)  237 mL Oral BID BM  . hydroxychloroquine  200 mg Oral BID  . levothyroxine  50 mcg Oral QAC breakfast  . pantoprazole  40 mg Oral BID  . QUEtiapine  600 mg Oral QHS  . topiramate  50 mg Oral BID   Continuous Infusions: . aztreonam 2 g (06/05/18 1404)   PRN Meds:.albuterol, ALPRAZolam, ondansetron **OR** ondansetron (ZOFRAN) IV  Allergies as of 06/04/2018 - Review Complete 06/04/2018  Allergen  Reaction Noted  . Sulfasalazine Hives 10/08/2013  . Lamictal [lamotrigine] Nausea And Vomiting and Other (See Comments) 11/08/2016  . Penicillins Hives and Itching 09/20/2014  . Sulfa antibiotics Hives 09/20/2014  . Morphine and related Itching and Rash 09/20/2014    Family History  Problem Relation Age of Onset  . Bipolar disorder Son   . Breast cancer Mother   . Breast cancer Sister   . Anxiety disorder Brother   . Depression Brother   . Breast cancer Maternal Aunt   . Breast cancer Maternal Grandmother     Social History   Socioeconomic History  . Marital status: Married    Spouse name: Not on file  . Number of children: Not on file  . Years of education: Not on file  . Highest education level: Not on file  Occupational History  . Not on file  Social Needs  . Financial resource strain: Not on file  . Food insecurity:    Worry: Not on file     Inability: Not on file  . Transportation needs:    Medical: Not on file    Non-medical: Not on file  Tobacco Use  . Smoking status: Current Every Day Smoker    Packs/day: 0.25    Years: 20.00    Pack years: 5.00    Types: Cigarettes  . Smokeless tobacco: Never Used  Substance and Sexual Activity  . Alcohol use: Yes    Comment: occ  . Drug use: No  . Sexual activity: Not on file  Lifestyle  . Physical activity:    Days per week: Not on file    Minutes per session: Not on file  . Stress: Not on file  Relationships  . Social connections:    Talks on phone: Not on file    Gets together: Not on file    Attends religious service: Not on file    Active member of club or organization: Not on file    Attends meetings of clubs or organizations: Not on file    Relationship status: Not on file  . Intimate partner violence:    Fear of current or ex partner: Not on file    Emotionally abused: Not on file    Physically abused: Not on file    Forced sexual activity: Not on file  Other Topics Concern  . Not on file  Social History Narrative  . Not on file    Review of Systems: All negative except as stated above in HPI.  Physical Exam: Vital signs: Vitals:   06/05/18 0756 06/05/18 1202  BP: (!) 107/49 (!) 103/52  Pulse: (!) 107 (!) 106  Resp: 17 19  Temp: 98 F (36.7 C) 98 F (36.7 C)  SpO2: 96% 93%   Last BM Date: (PTA) General:   Lethargic, obese, no acute distress, pleasant Head: normocephalic, atraumatic Eyes: +icteric sclera ENT: oropharynx clear Neck: supple, nontender Lungs:  Clear throughout to auscultation.   No wheezes, crackles, or rhonchi. No acute distress. Heart:  Regular rate and rhythm; no murmurs, clicks, rubs,  or gallops. Abdomen: diffuse tenderness with guarding, soft, nondistended, +BS, obese  Rectal:  Deferred Ext: 3+ pitting edema Skin: jaundice  GI:  Lab Results: Recent Labs    06/04/18 1618 06/04/18 1646  WBC 13.4*  --   HGB 12.8  12.2  HCT 38.2 36.0  PLT 158  158  --    BMET Recent Labs    06/04/18 1618 06/04/18 1646 06/05/18 1219  NA  132* 133* 133*  K 4.2 4.2 4.3  CL 98 101 103  CO2 19*  --  19*  GLUCOSE 59* 50* 75  BUN 18 18 19   CREATININE 1.25* 1.50* 1.46*  CALCIUM 8.0*  --  7.4*   LFT Recent Labs    06/04/18 1620 06/05/18 1219  PROT  --  3.5*  ALBUMIN  --  1.7*  AST  --  61*  ALT  --  63*  ALKPHOS  --  358*  BILITOT  --  12.2*  BILIDIR 8.9*  --    PT/INR Recent Labs    06/04/18 1618  LABPROT 16.1*  INR 1.30     Studies/Results: Ct Head Wo Contrast  Result Date: 06/04/2018 CLINICAL DATA:  Altered mental status. Difficulty walking for 2 weeks with slurred speech. Possible medication reaction. EXAM: CT HEAD WITHOUT CONTRAST TECHNIQUE: Contiguous axial images were obtained from the base of the skull through the vertex without intravenous contrast. COMPARISON:  CT head 05/31/2018. FINDINGS: Brain: There is no evidence of acute intracranial hemorrhage, mass lesion, brain edema or extra-axial fluid collection. The ventricles and subarachnoid spaces are appropriately sized for age. There is no CT evidence of acute cortical infarction. Vascular:  No hyperdense vessel identified. Skull: Negative for fracture or focal lesion. Prominent scaphocephaly again noted. Sinuses/Orbits: The visualized paranasal sinuses and mastoid air cells are clear. No orbital abnormalities are seen. Other: None. IMPRESSION: Stable head CT without acute intracranial findings. Electronically Signed   By: Richardean Sale M.D.   On: 06/04/2018 17:35   Mr Jeri Cos And Wo Contrast  Result Date: 06/05/2018 CLINICAL DATA:  47 y/o F; progressive decline in mental status over 3-6 months with right-sided facial droop and slurred speech. Difficulty walking. History of RA, bipolar, generalized anxiety disorder, and hyperparathyroidism status post parathyroidectomy. EXAM: MRI HEAD WITHOUT AND WITH CONTRAST TECHNIQUE: Multiplanar, multiecho  pulse sequences of the brain and surrounding structures were obtained without and with intravenous contrast. CONTRAST:  38mL MULTIHANCE GADOBENATE DIMEGLUMINE 529 MG/ML IV SOLN COMPARISON:  09/07/2017 MRI head.  06/04/2018 CT head. FINDINGS: Brain: No acute infarction, hemorrhage, hydrocephalus, extra-axial collection or mass lesion. No significant structural or signal abnormality of the brain. After administration of intravenous contrast there is no abnormal enhancement. Vascular: Normal flow voids. Skull and upper cervical spine: Normal marrow signal. Sinuses/Orbits: Negative. Other: Stable scaphocephaly. IMPRESSION: No acute intracranial process or abnormal enhancement. Stable normal MRI of the brain. Electronically Signed   By: Kristine Garbe M.D.   On: 06/05/2018 05:32   Ct Abdomen Pelvis W Contrast  Result Date: 06/04/2018 CLINICAL DATA:  Painless jaundice, weight loss or fatigue of more than 3 months duration, slurred speech, unable to walk for 2 weeks, worsening symptoms, history asthma, fibromyalgia, gastric paresis, GERD, kidney stones, primary hyperparathyroidism, irritable bowel syndrome, rheumatoid arthritis, smoker EXAM: CT ABDOMEN AND PELVIS WITH CONTRAST TECHNIQUE: Multidetector CT imaging of the abdomen and pelvis was performed using the standard protocol following bolus administration of intravenous contrast. Sagittal and coronal MPR images reconstructed from axial data set. CONTRAST:  115mL ISOVUE-300 IOPAMIDOL (ISOVUE-300) INJECTION 61% IV. No oral contrast. COMPARISON:  11/05/2016 FINDINGS: Lower chest: Infiltrate and atelectasis in RIGHT middle lobe. Subsegmental atelectasis lingula Hepatobiliary: Marked fatty infiltration of liver. Gallbladder surgically absent. No biliary dilatation. Pancreas: Normal appearance Spleen: Normal appearance Adrenals/Urinary Tract: BILATERAL nonobstructing renal calculi. Adrenal glands, kidneys, ureters, and bladder otherwise normal appearance. Small  Stomach/Bowel: Normal appendix. Prior gastric bypass surgery with hiatal hernia versus distension of reservoir in  the inferior mediastinum. Fluid within the stented esophagus. Few minimally prominent small bowel loops with out evidence of obstruction. Small bowel loops otherwise unremarkable. Significant bowel wall thickening of cecum, ascending colon, and scattered sites at of the mid transverse colon, splenic flexure and descending colon compatible with colitis. Vascular/Lymphatic: Aorta normal caliber with minimal atherosclerotic calcification. No adenopathy. Scattered pelvic phleboliths. Reproductive: Uterus surgically absent. Nonvisualization of ovaries. Other: Low-attenuation free fluid in pelvis. No free air. No hernia. Musculoskeletal: Bones demineralized. IMPRESSION: Patchy areas of colonic wall thickening greatest at ascending colon and cecum consistent with colitis; differential diagnosis includes infection and inflammatory bowel disease, with ischemia considered less likely due to lack of significant vascular disease changes. Prior gastric bypass surgery with either hiatal hernia or distension of pouch and fluid within the distal esophagus. Marked fatty infiltration of liver. Small amount of free pelvic fluid. Electronically Signed   By: Lavonia Dana M.D.   On: 06/04/2018 17:45   Dg Chest Portable 1 View  Result Date: 06/04/2018 CLINICAL DATA:  Hypotension, lethargy, weakness EXAM: PORTABLE CHEST 1 VIEW COMPARISON:  Portable exam 1554 hours compared to 05/22/2018 FINDINGS: Normal heart size mediastinal contours. BILATERAL perihilar interstitial infiltrates question edema versus infection. Minimal subsegmental atelectasis in LEFT upper lobe. Remaining lungs clear. No pleural effusion or pneumothorax. Bones demineralized. IMPRESSION: New perihilar infiltrates question edema versus infection. Electronically Signed   By: Lavonia Dana M.D.   On: 06/04/2018 16:17   US Abdomen Limited Ruq  Result Date:  06/04/2018 CLINICAL DATA:  Jaundice EXAM: ULTRASOUND ABDOMEN LIMITED RIGHT UPPER QUADRANT COMPARISON:  None FINDINGS: Gallbladder: Gallbladder surgically absent Common bile duct: Diameter: A tubular structure measuring 17 mm in diameter is seen at the porta hepatis. No definite flow is seen within this structure on color Doppler imaging. Based on accompanying CT exam done immediately prior, this does not represent the common bile duct, which measures only 4 mm diameter on CT. Likely this represents the portal vein which has very slow flow not demonstrated by ultrasound; portal vein was opacified by contrast and patent on CT, not thrombosed. Liver: Increased hepatic echogenicity consistent with the marked fatty infiltration seen on CT. No focal hepatic mass lesion. Portal vein is patent and opacified by CT, with flow detected in the LEFT portal vein on color Doppler imaging but not definitely within the main portal vein question slow flow. No RIGHT upper quadrant free fluid. IMPRESSION: Marked fatty infiltration of liver. Post cholecystectomy. Suspect slow flow within the portal vein, which is patent and enhancing on CT. Electronically Signed   By: Lavonia Dana M.D.   On: 06/04/2018 18:34    Impression/Plan: 47 yo with acute encephalopathy being evaluated by neurology and the primary team who has had chronically elevated ALP with unrevealing liver biopsy in June. Severe hyperbilirubinemia with mainly direct component. INR 1.3. Source of her elevated ALP suspicious for primary biliary cholangitis but the negative liver biopsy and negative AMA goes against that. She is on multiple medicines that could cause hepatic injury and that may need to be evaluated further during f/u if elevated LFTs persist to see if some of her meds could be changed that are potentially hepatotoxic. Will check ASMA and recheck AMA, ANA. Her CT showing colitis is likely due to her severely low albumin. Continue supportive care. D/W Dr. Therisa Doyne.  Will follow.    LOS: 1 day   Bigelow C.  06/05/2018, 3:48 PM  Questions please call [phone]

## 2018-06-05 NOTE — Progress Notes (Signed)
PHARMACY NOTE:  ANTIMICROBIAL RENAL DOSAGE ADJUSTMENT  Current antimicrobial regimen includes a mismatch between antimicrobial dosage and estimated renal function.  As per policy approved by the Pharmacy & Therapeutics and Medical Executive Committees, the antimicrobial dosage will be adjusted accordingly.  Current antimicrobial dosage:  Vancomycin 1500 mg IV q24h  Indication: Sepsis  Renal Function:  Estimated Creatinine Clearance: 54.2 mL/min (A) (by C-G formula based on SCr of 1.5 mg/dL (H)). []      On intermittent HD, scheduled: []      On CRRT    Antimicrobial dosage has been changed to:  Vancomycin 1 Gm IV q24h for est AUC=497  Additional comments:  Consider ordering Vancomycin per Rx protocol, so we can f/u scr/levels and adjust accordingly.   Thank you for allowing pharmacy to be a part of this patient's care.  Dorrene German, [facility] 06/05/2018 1:12 AM

## 2018-06-05 NOTE — Progress Notes (Signed)
PHARMACY - PHYSICIAN COMMUNICATION CRITICAL VALUE ALERT - BLOOD CULTURE IDENTIFICATION (BCID)  [name] is an 47 y.o. female who presented to [facility] on 06/04/2018 with a chief complaint of sepsis due to colitis.   Assessment:  Jordan Russell has a history of colitis that has been on Enbrel in the past. Her blood cx came back with e.coli with CRE neg. We will de-escalate to aztreonam by itself based on the approved protocol. MRSA PCR neg.   Name of physician (or Provider) Contacted: Messaged Dr. Cathlean Sauer  Current antibiotics: Vanc/levquin/aztreonam  Changes to prescribed antibiotics recommended:  De-escalate to aztreonam  Results for orders placed or performed during the hospital encounter of 06/04/18  Blood Culture ID Panel (Reflexed) (Collected: 06/04/2018  6:36 PM)  Result Value Ref Range   Enterococcus species NOT DETECTED NOT DETECTED   Listeria monocytogenes NOT DETECTED NOT DETECTED   Staphylococcus species NOT DETECTED NOT DETECTED   Staphylococcus aureus NOT DETECTED NOT DETECTED   Streptococcus species NOT DETECTED NOT DETECTED   Streptococcus agalactiae NOT DETECTED NOT DETECTED   Streptococcus pneumoniae NOT DETECTED NOT DETECTED   Streptococcus pyogenes NOT DETECTED NOT DETECTED   Acinetobacter baumannii NOT DETECTED NOT DETECTED   Enterobacteriaceae species DETECTED (A) NOT DETECTED   Enterobacter cloacae complex NOT DETECTED NOT DETECTED   Escherichia coli DETECTED (A) NOT DETECTED   Klebsiella oxytoca NOT DETECTED NOT DETECTED   Klebsiella pneumoniae NOT DETECTED NOT DETECTED   Proteus species NOT DETECTED NOT DETECTED   Serratia marcescens NOT DETECTED NOT DETECTED   Carbapenem resistance NOT DETECTED NOT DETECTED   Haemophilus influenzae NOT DETECTED NOT DETECTED   Neisseria meningitidis NOT DETECTED NOT DETECTED   Pseudomonas aeruginosa NOT DETECTED NOT DETECTED   Candida albicans NOT DETECTED NOT DETECTED   Candida glabrata NOT DETECTED NOT DETECTED   Candida krusei NOT DETECTED NOT DETECTED   Candida parapsilosis NOT DETECTED NOT DETECTED   Candida tropicalis NOT DETECTED NOT DETECTED   Onnie Boer, PharmD, BCIDP, AAHIVP, CPP Infectious Disease Pharmacist Pager: [phone] 06/05/2018 1:32 PM

## 2018-06-05 NOTE — Progress Notes (Signed)
PROGRESS NOTE    Jordan Russell  DVV:[id number] DOB: [birth year] DOA: 06/04/2018 PCP: Josetta Huddle, MD    Brief Narrative:  47 year old female who presented with altered mental status.  She has significant past medical from her arthritis, bipolar disease, hyperparathyroidism, gastric varices status post gastric bypass who presents with acute worsening altered mentation.  Positive for edema of the lower extremities for the last 6 months, significant decreased p.o. intake for the last 3 years.  Unable to ambulate due to severe edema of lower extremities.  Reported abdominal pain and jaundice for the last few days prior to hospitalization.  Physical examination blood pressure 100/45, 97/54 pulse rate 104, respiratory rate 21, temperature 97.7, oxygen saturation 78%.  She had decreased breath sounds bilaterally, heart S1-S2 present and rhythmic, abdomen protuberant, +4+ pitting bilaterally.  No skin rashes, positive jaundice.  Sodium 132, potassium 4.2, chloride 98, bicarb 19, glucose 59, BUN 18, creatinine 1.25, anion gap 15, alkaline phosphatase 540, albumin 1.9, lipase 20, AST 91, ALT 91, ammonia 28, total bilirubin is 14.3, white count 13.4, hemoglobin 12.8, hematocrit 38.2 platelets 158.  Urinalysis 0-5 white cells.  Positive choluria.  Drug screen positive for benzodiazepines.  Alcohol level less than 10.  Chest x-ray hypoinflated, no significant infiltrates.  EKG normal sinus rhythm, normal intervals, axis.  CT of the abdomen with patchy areas of colonic wall thickening, ascending colon and cecum consistent with colitis.  Head CT negative for acute changes.  Patient  was admitted to the hospital with the working diagnosis of possible sepsis.  \  Assessment & Plan:   Principal Problem:   Severe sepsis ([place]) Active Problems:   Bipolar disorder ([place])   Anxiety   Hypoglycemia   S/P gastric bypass   Acute encephalopathy   Hypoxia   Hyperbilirubinemia   Fatty infiltration of liver   AKI  (acute kidney injury) ([place])   Protein-calorie malnutrition, severe  1. Sepsis due to acute colitis complicated with gram negative bacteremia. Will continue antibiotic therapy with aztreonam, hold on further IV fluids to prevent volume overload, follow on cell count, cultures and temperature curve.   2. Hyperbilirubinemia. Will continue to follow liver function, mentation has improved, she dose have cholestatic syndrome. Fatty liver on [place], will check outpatient records for more information.   4. AKI. Renal function with serum cr at 1,46 from 1,50 will continue to follow renal function in am, will probably need diuresis when more stable.   5. RA. Continue plaquenil  DVT prophylaxis: scd   Code Status: full Family Communication: I spoke with patient's family at the bedside and all questions were addressed.  Disposition Plan/ discharge barriers: pending clinical improvement    Consultants:   GI   Procedures:     Antimicrobials:       Subjective: Patient still very weak and deconditioned, positive edema, no further dyspnea, nausea or vomiting, persistent abdominal pain.   Objective: Vitals:   06/05/18 0627 06/05/18 0756 06/05/18 1202 06/05/18 1614  BP: (!) 102/51 (!) 107/49 (!) 103/52   Pulse: (!) 107 (!) 107 (!) 106   Resp: 20 17 19    Temp:  98 F (36.7 C) 98 F (36.7 C) 98.4 F (36.9 C)  TempSrc:  Oral Oral Oral  SpO2: 96% 96% 93%   Weight:      Height:        Intake/Output Summary (Last 24 hours) at 06/05/2018 1621 Last data filed at 06/05/2018 1404 Gross per 24 hour  Intake 1530 ml  Output  800 ml  Net 730 ml   Filed Weights   06/04/18 1511 06/05/18 0315  Weight: 94.8 kg (209 lb) 104.3 kg (229 lb 15 oz)    Examination:   General: deconditioned  Neurology: Awake and alert, non focal  E ENT: no pallor, positive icterus, oral mucosa moist Cardiovascular: No JVD. S1-S2 present, rhythmic, no gallops, rubs, or murmurs. No lower extremity edema. Pulmonary:  positive breath sounds bilaterally, poor air movement, no wheezing, scattered rhonchi, but no rales. Gastrointestinal. Abdomen distended, no organomegaly, but tender lower quadrants, with no rebound or guarding Skin. No rashes Musculoskeletal: no joint deformities     Data Reviewed: I have personally reviewed following labs and imaging studies  CBC: Recent Labs  Lab 05/31/18 1008 06/04/18 1618 06/04/18 1646  WBC 10.0 13.4*  --   NEUTROABS 7.7 11.9*  --   HGB 12.6 12.8 12.2  HCT 36.3 38.2 36.0  MCV 91.2 93.9  --   PLT 225 158  158  --    Basic Metabolic Panel: Recent Labs  Lab 05/31/18 1008 06/04/18 1618 06/04/18 1646 06/05/18 1219  NA 137 132* 133* 133*  K 3.3* 4.2 4.2 4.3  CL 103 98 101 103  CO2 25 19*  --  19*  GLUCOSE 88 59* 50* 75  BUN 10 18 18 19   CREATININE 0.56 1.25* 1.50* 1.46*  CALCIUM 8.2* 8.0*  --  7.4*   GFR: Estimated Creatinine Clearance: 58.5 mL/min (A) (by C-G formula based on SCr of 1.46 mg/dL (H)). Liver Function Tests: Recent Labs  Lab 06/04/18 1618 06/05/18 1219  AST 91* 61*  ALT 91* 63*  ALKPHOS 540* 358*  BILITOT 14.3* 12.2*  PROT 4.3* 3.5*  ALBUMIN 1.9* 1.7*   Recent Labs  Lab 06/04/18 1618  LIPASE 20   Recent Labs  Lab 06/04/18 1618  AMMONIA 28   Coagulation Profile: Recent Labs  Lab 06/04/18 1618  INR 1.30   Cardiac Enzymes: Recent Labs  Lab 06/04/18 1618 06/04/18 2258  TROPONINI 0.04* 0.04*   BNP (last 3 results) No results for input(s): PROBNP in the last 8760 hours. HbA1C: No results for input(s): HGBA1C in the last 72 hours. CBG: Recent Labs  Lab 06/04/18 1728 06/04/18 1831 06/05/18 0206 06/05/18 0607 06/05/18 1224  GLUCAP 110* 72 78 80 77   Lipid Profile: No results for input(s): CHOL, HDL, LDLCALC, TRIG, CHOLHDL, LDLDIRECT in the last 72 hours. Thyroid Function Tests: Recent Labs    06/05/18 0714  TSH 7.063*   Anemia Panel: No results for input(s): VITAMINB12, FOLATE, FERRITIN, TIBC,  IRON, RETICCTPCT in the last 72 hours.    Radiology Studies: I have reviewed all of the imaging during this hospital visit personally     Scheduled Meds: . enoxaparin (LOVENOX) injection  40 mg Subcutaneous QHS  . feeding supplement (ENSURE ENLIVE)  237 mL Oral BID BM  . hydroxychloroquine  200 mg Oral BID  . levothyroxine  50 mcg Oral QAC breakfast  . pantoprazole  40 mg Oral BID  . QUEtiapine  600 mg Oral QHS  . topiramate  50 mg Oral BID   Continuous Infusions: . aztreonam 2 g (06/05/18 1404)     LOS: 1 day        Cayde Held Gerome Apley, MD [facility] Pager [phone]

## 2018-06-05 NOTE — Progress Notes (Signed)
Pt with no UO since 2100 via straight cath (at [facility], [facility]). Bladder scanned for 13ml at this time. Pt states she has no urge to urinate as well. MD Opyd made aware of situation via text page.

## 2018-06-05 NOTE — Progress Notes (Signed)
PROGRESS NOTE    Jordan Russell  MPN:[id number] DOB: 06-15-1971 DOA: 06/04/2018 PCP: Josetta Huddle, MD  Outpatient Specialists:     Brief Narrative:  47 yo female with a PMH of RA (Ebrel + Plaquenil), GAD, hyperparathyroidism s/p parathyroidectomy, gastroparesis, nephrolithiasis, s/p gastric bypass, is here for progressively worsening mental and functional status for 6 months. Endorses bilateral leg swelling 2x years, now unable to ambulate without significant assistance. No history of DVT or PE. [facility] has been following patient due to elevated LFTs - June 2019 liver biopsy was normal. Endorses progressively worsening slurred speech, right-sided facial droop, and impaired memory.   ED course found the patient hypoxic at 78%, improved with 3L [place] to 98%. Hypotensive at 72/62 remeasured  105/65. Pertinent labs include the following: Lactate x2 6.17, 6.23. Troponin x2 0.04. BNP 247. CMP - NA 132 CO2 19 Glu 59 BUN 18 Cr 1.25 Ca 8.0 Total Protein 4.3 Albumin 1.9 AST/ALT 91 ALP 540 Total Bili 14.3 GFR 50. Ethanol <10. Acetaminophen <10. Ammonia  28. CBC w diff - WBC 13.4 RDW 16.4 Neutro 11.9. LDH 247. Lipase 20. Direct Bili 8.9. UDS (+) benzos. Blood cultures - (+) enterobacter, E.coli. Procalcitonin 23.93. TSH 7.063. UA - brown, unable to determine other results due to quality of urine.  CXR showed new perihilar infiltrates question edema vs infection. CT head wo contrast - stable without acute intracranial findings. CT abdomen w constrast - Patchy areas of colonic wall thickening greatest at ascending colon and cecum consistent with colitis; differential diagnosis includes infection and inflammatory bowel disease, with ischemia considered less likely due to lack of significant vascular disease changes. Prior gastric bypass surgery with either hiatal hernia or distension of pouch and fluid within the distal esophagus. Marked fatty infiltration of liver. Small amount of free pelvic fluid. US abdomen  RUQ - Marked fatty infiltration of liver. Post cholecystectomy. Suspect slow flow within the portal vein, which is patent and enhancing on CT. The patient was admitted for severe sepsis workup and MR brain.   Critical care, neurology, and GI following.   Assessment & Plan:   Principal Problem:   Severe sepsis ([place]) Active Problems:   Bipolar disorder ([place])   Anxiety   Hypoglycemia   S/P gastric bypass   Acute encephalopathy   Hypoxia   Hyperbilirubinemia   Fatty infiltration of liver   AKI (acute kidney injury) ([place])  Severe Sepsis: -Most recent lactate 5.1 (2 prior were 6.17, 6.23) -WBC 13.4 -Blood cultures (+) enterobacter, E.coli -Receiving Levaquin + Aztreonam -Vancomycin d/c -CT abdomen w constrast - Patchy areas of colonic wall thickening greatest at ascending colon and cecum consistent with colitis; differential diagnosis includes infection and inflammatory bowel disease, with ischemia considered less likely due to lack of significant vascular disease changes. Prior gastric bypass surgery with either hiatal hernia or distension of pouch and fluid within the distal esophagus. Marked fatty infiltration of liver. Small amount of free pelvic fluid.  -US abdomen RUQ - Marked fatty infiltration of liver. Post cholecystectomy. Suspect slow flow within the portal vein, which is patent and enhancing on CT. -MELD score 23 -GI following, will appreciate input   Acute Encephalopathy: -A&O to person, place, and time. Moderate right-sided dysarthria. -Ammonia wnl 28, ethanol wnl <10, acetaminophen wnl <10, AST/ALT 91, ALP 540 -CT head wo contrast - stable without acute intracranial findings. -MR Brain - normal -Neurology following  Hypoxia: -Patient endorses being short of breath when she speaks -96% on 2L via [place] -Albuterol prn  Hyperbilirubinemia w/ Coagulopathy: -Diffusely jaundice with scleral icterus, s/p cholecystectomy -Direct bili 8.9, Total bili 14.3, D-Dimer 2.30, PT  16.1, LDH 247 -US abdomen RUQ - Marked fatty infiltration of liver. -Sed rate 5 -Haptoglobin pending -GI following, will appreciate input  Hypoalbuminemia: -Albumin noted to be 1.9 -Prealbumin pending  Hypoglycemia: -CBG today 80 -Continue to monitor Q6H  AKI: -Need repeat CMP -Last Cr yesterday 1.25, BUN 18 -Receiving IVF  Rheumatoid Arthritis: -Continue Plaquenil 200 mg BID  Hypothyroid: -TSH today 7.063 -Continue Synthroid 50 mcg QAC -Recheck TSH tomorrow  GAD: -Continue Xanax 1 mg QID prn  DVT prophylaxis: Lovenox Code Status: Full Family Communication: Husband was present at bedside during interview. The plan was discussed and all questions were answered. Disposition Plan:    Consultants:   Critical Care  Neurology  GI  Procedures:   MR Brain  Antimicrobials:   Vancomycin + Levaquin + Aztreonam    Subjective: The patient was lying supine in the hospital bed alert and oriented in no acute distress. The patient reports being short of breath while speaking. She endorses her speech is not normal and feels impaired. Her pain has improved since yesterday but still reports abdominal pain and bilateral leg pain.   Objective: Vitals:   06/05/18 0315 06/05/18 0527 06/05/18 0627 06/05/18 0756  BP:  (!) 110/52 (!) 102/51 (!) 107/49  Pulse:  (!) 107 (!) 107 (!) 107  Resp:  (!) 24 20 17   Temp: 98.1 F (36.7 C)   98 F (36.7 C)  TempSrc: Oral   Oral  SpO2:  92% 96% 96%  Weight: 104.3 kg (229 lb 15 oz)     Height: 5\' 7"  (1.702 m)       Intake/Output Summary (Last 24 hours) at 06/05/2018 1014 Last data filed at 06/05/2018 0931 Gross per 24 hour  Intake 1310 ml  Output 400 ml  Net 910 ml   Filed Weights   06/04/18 1511 06/05/18 0315  Weight: 94.8 kg (209 lb) 104.3 kg (229 lb 15 oz)    Examination:  General exam: Appears sick in nature with work of breathing and right-sided dysarthria. Respiratory system: Bilateral rhonchi auscultated in the apices.  Respiratory effort normal. Cardiovascular system: S1 & S2 heard, RRR. No JVD, murmurs, rubs, gallops or clicks. Gastrointestinal system: Abdomen is nondistended, soft. TTP to RUQ as well as bilateral lower quadrants. No organomegaly or masses felt. Normal bowel sounds heard. Central nervous system: Alert and oriented. No focal neurological deficits. Extremities: Symmetric 5 x 5 power. Bilateral pitting edema - right 3-4+, left 2-3+ with radiation to the upper thighs.  Skin: Diffuse jaundice. Diffuse bruising to upper and lower extremities.  No rashes, lesions or ulcers Eyes: Scleral icterus. PERRLA. Psychiatry: Judgement and insight appear normal. Mood & affect appropriate.     Data Reviewed: I have personally reviewed following labs and imaging studies  CBC: Recent Labs  Lab 05/31/18 1008 06/04/18 1618 06/04/18 1646  WBC 10.0 13.4*  --   NEUTROABS 7.7 11.9*  --   HGB 12.6 12.8 12.2  HCT 36.3 38.2 36.0  MCV 91.2 93.9  --   PLT 225 158  158  --    Basic Metabolic Panel: Recent Labs  Lab 05/31/18 1008 06/04/18 1618 06/04/18 1646  NA 137 132* 133*  K 3.3* 4.2 4.2  CL 103 98 101  CO2 25 19*  --   GLUCOSE 88 59* 50*  BUN 10 18 18   CREATININE 0.56 1.25* 1.50*  CALCIUM 8.2* 8.0*  --  GFR: Estimated Creatinine Clearance: 57 mL/min (A) (by C-G formula based on SCr of 1.5 mg/dL (H)). Liver Function Tests: Recent Labs  Lab 06/04/18 1618  AST 91*  ALT 91*  ALKPHOS 540*  BILITOT 14.3*  PROT 4.3*  ALBUMIN 1.9*   Recent Labs  Lab 06/04/18 1618  LIPASE 20   Recent Labs  Lab 06/04/18 1618  AMMONIA 28   Coagulation Profile: Recent Labs  Lab 06/04/18 1618  INR 1.30   Cardiac Enzymes: Recent Labs  Lab 06/04/18 1618 06/04/18 2258  TROPONINI 0.04* 0.04*   BNP (last 3 results) No results for input(s): PROBNP in the last 8760 hours. HbA1C: No results for input(s): HGBA1C in the last 72 hours. CBG: Recent Labs  Lab 06/04/18 1728 06/04/18 1831  06/05/18 0206 06/05/18 0607  GLUCAP 110* 72 78 80   Lipid Profile: No results for input(s): CHOL, HDL, LDLCALC, TRIG, CHOLHDL, LDLDIRECT in the last 72 hours. Thyroid Function Tests: Recent Labs    06/05/18 0714  TSH 7.063*   Anemia Panel: No results for input(s): VITAMINB12, FOLATE, FERRITIN, TIBC, IRON, RETICCTPCT in the last 72 hours. Urine analysis:    Component Value Date/Time   COLORURINE BROWN (A) 06/04/2018 1836   APPEARANCEUR CLOUDY (A) 06/04/2018 1836   LABSPEC  06/04/2018 1836    TEST NOT REPORTED DUE TO COLOR INTERFERENCE OF URINE PIGMENT   PHURINE  06/04/2018 1836    TEST NOT REPORTED DUE TO COLOR INTERFERENCE OF URINE PIGMENT   GLUCOSEU (A) 06/04/2018 1836    TEST NOT REPORTED DUE TO COLOR INTERFERENCE OF URINE PIGMENT   HGBUR (A) 06/04/2018 1836    TEST NOT REPORTED DUE TO COLOR INTERFERENCE OF URINE PIGMENT   BILIRUBINUR (A) 06/04/2018 1836    TEST NOT REPORTED DUE TO COLOR INTERFERENCE OF URINE PIGMENT   KETONESUR (A) 06/04/2018 1836    TEST NOT REPORTED DUE TO COLOR INTERFERENCE OF URINE PIGMENT   PROTEINUR (A) 06/04/2018 1836    TEST NOT REPORTED DUE TO COLOR INTERFERENCE OF URINE PIGMENT   NITRITE (A) 06/04/2018 1836    TEST NOT REPORTED DUE TO COLOR INTERFERENCE OF URINE PIGMENT   LEUKOCYTESUR (A) 06/04/2018 1836    TEST NOT REPORTED DUE TO COLOR INTERFERENCE OF URINE PIGMENT   Sepsis Labs: @LABRCNTIP (procalcitonin:4,lacticidven:4)  ) Recent Results (from the past 240 hour(s))  MRSA PCR Screening     Status: None   Collection Time: 06/05/18  3:34 AM  Result Value Ref Range Status   MRSA by PCR NEGATIVE NEGATIVE Final    Comment:        The GeneXpert MRSA Assay (FDA approved for NASAL specimens only), is one component of a comprehensive MRSA colonization surveillance program. It is not intended to diagnose MRSA infection nor to guide or monitor treatment for MRSA infections. Performed at [facility], [place] [address],  [place], [place] [address]          Radiology Studies: Ct Head Wo Contrast  Result Date: 06/04/2018 CLINICAL DATA:  Altered mental status. Difficulty walking for 2 weeks with slurred speech. Possible medication reaction. EXAM: CT HEAD WITHOUT CONTRAST TECHNIQUE: Contiguous axial images were obtained from the base of the skull through the vertex without intravenous contrast. COMPARISON:  CT head 05/31/2018. FINDINGS: Brain: There is no evidence of acute intracranial hemorrhage, mass lesion, brain edema or extra-axial fluid collection. The ventricles and subarachnoid spaces are appropriately sized for age. There is no CT evidence of acute cortical infarction. Vascular:  No hyperdense vessel  identified. Skull: Negative for fracture or focal lesion. Prominent scaphocephaly again noted. Sinuses/Orbits: The visualized paranasal sinuses and mastoid air cells are clear. No orbital abnormalities are seen. Other: None. IMPRESSION: Stable head CT without acute intracranial findings. Electronically Signed   By: Richardean Sale M.D.   On: 06/04/2018 17:35   Mr Jeri Cos And Wo Contrast  Result Date: 06/05/2018 CLINICAL DATA:  47 y/o F; progressive decline in mental status over 3-6 months with right-sided facial droop and slurred speech. Difficulty walking. History of RA, bipolar, generalized anxiety disorder, and hyperparathyroidism status post parathyroidectomy. EXAM: MRI HEAD WITHOUT AND WITH CONTRAST TECHNIQUE: Multiplanar, multiecho pulse sequences of the brain and surrounding structures were obtained without and with intravenous contrast. CONTRAST:  67mL MULTIHANCE GADOBENATE DIMEGLUMINE 529 MG/ML IV SOLN COMPARISON:  09/07/2017 MRI head.  06/04/2018 CT head. FINDINGS: Brain: No acute infarction, hemorrhage, hydrocephalus, extra-axial collection or mass lesion. No significant structural or signal abnormality of the brain. After administration of intravenous contrast there is no abnormal enhancement. Vascular: Normal  flow voids. Skull and upper cervical spine: Normal marrow signal. Sinuses/Orbits: Negative. Other: Stable scaphocephaly. IMPRESSION: No acute intracranial process or abnormal enhancement. Stable normal MRI of the brain. Electronically Signed   By: Kristine Garbe M.D.   On: 06/05/2018 05:32   Ct Abdomen Pelvis W Contrast  Result Date: 06/04/2018 CLINICAL DATA:  Painless jaundice, weight loss or fatigue of more than 3 months duration, slurred speech, unable to walk for 2 weeks, worsening symptoms, history asthma, fibromyalgia, gastric paresis, GERD, kidney stones, primary hyperparathyroidism, irritable bowel syndrome, rheumatoid arthritis, smoker EXAM: CT ABDOMEN AND PELVIS WITH CONTRAST TECHNIQUE: Multidetector CT imaging of the abdomen and pelvis was performed using the standard protocol following bolus administration of intravenous contrast. Sagittal and coronal MPR images reconstructed from axial data set. CONTRAST:  161mL ISOVUE-300 IOPAMIDOL (ISOVUE-300) INJECTION 61% IV. No oral contrast. COMPARISON:  11/05/2016 FINDINGS: Lower chest: Infiltrate and atelectasis in RIGHT middle lobe. Subsegmental atelectasis lingula Hepatobiliary: Marked fatty infiltration of liver. Gallbladder surgically absent. No biliary dilatation. Pancreas: Normal appearance Spleen: Normal appearance Adrenals/Urinary Tract: BILATERAL nonobstructing renal calculi. Adrenal glands, kidneys, ureters, and bladder otherwise normal appearance. Small Stomach/Bowel: Normal appendix. Prior gastric bypass surgery with hiatal hernia versus distension of reservoir in the inferior mediastinum. Fluid within the stented esophagus. Few minimally prominent small bowel loops with out evidence of obstruction. Small bowel loops otherwise unremarkable. Significant bowel wall thickening of cecum, ascending colon, and scattered sites at of the mid transverse colon, splenic flexure and descending colon compatible with colitis. Vascular/Lymphatic: Aorta  normal caliber with minimal atherosclerotic calcification. No adenopathy. Scattered pelvic phleboliths. Reproductive: Uterus surgically absent. Nonvisualization of ovaries. Other: Low-attenuation free fluid in pelvis. No free air. No hernia. Musculoskeletal: Bones demineralized. IMPRESSION: Patchy areas of colonic wall thickening greatest at ascending colon and cecum consistent with colitis; differential diagnosis includes infection and inflammatory bowel disease, with ischemia considered less likely due to lack of significant vascular disease changes. Prior gastric bypass surgery with either hiatal hernia or distension of pouch and fluid within the distal esophagus. Marked fatty infiltration of liver. Small amount of free pelvic fluid. Electronically Signed   By: Lavonia Dana M.D.   On: 06/04/2018 17:45   Dg Chest Portable 1 View  Result Date: 06/04/2018 CLINICAL DATA:  Hypotension, lethargy, weakness EXAM: PORTABLE CHEST 1 VIEW COMPARISON:  Portable exam 1554 hours compared to 05/22/2018 FINDINGS: Normal heart size mediastinal contours. BILATERAL perihilar interstitial infiltrates question edema versus infection. Minimal subsegmental atelectasis in  LEFT upper lobe. Remaining lungs clear. No pleural effusion or pneumothorax. Bones demineralized. IMPRESSION: New perihilar infiltrates question edema versus infection. Electronically Signed   By: Lavonia Dana M.D.   On: 06/04/2018 16:17   US Abdomen Limited Ruq  Result Date: 06/04/2018 CLINICAL DATA:  Jaundice EXAM: ULTRASOUND ABDOMEN LIMITED RIGHT UPPER QUADRANT COMPARISON:  None FINDINGS: Gallbladder: Gallbladder surgically absent Common bile duct: Diameter: A tubular structure measuring 17 mm in diameter is seen at the porta hepatis. No definite flow is seen within this structure on color Doppler imaging. Based on accompanying CT exam done immediately prior, this does not represent the common bile duct, which measures only 4 mm diameter on CT. Likely this  represents the portal vein which has very slow flow not demonstrated by ultrasound; portal vein was opacified by contrast and patent on CT, not thrombosed. Liver: Increased hepatic echogenicity consistent with the marked fatty infiltration seen on CT. No focal hepatic mass lesion. Portal vein is patent and opacified by CT, with flow detected in the LEFT portal vein on color Doppler imaging but not definitely within the main portal vein question slow flow. No RIGHT upper quadrant free fluid. IMPRESSION: Marked fatty infiltration of liver. Post cholecystectomy. Suspect slow flow within the portal vein, which is patent and enhancing on CT. Electronically Signed   By: Lavonia Dana M.D.   On: 06/04/2018 18:34        Scheduled Meds: . enoxaparin (LOVENOX) injection  40 mg Subcutaneous QHS  . feeding supplement (ENSURE ENLIVE)  237 mL Oral BID BM  . hydroxychloroquine  200 mg Oral BID  . levothyroxine  50 mcg Oral QAC breakfast  . pantoprazole  40 mg Oral BID  . QUEtiapine  600 mg Oral QHS  . topiramate  50 mg Oral BID   Continuous Infusions: . aztreonam 2 g (06/05/18 0345)  . levofloxacin (LEVAQUIN) IV    . vancomycin       LOS: 1 day     Marney Setting, PA-S Riccardo Dubin Arrien MD [facility] Pager [phone]  If 7PM-7AM, please contact night-coverage [email or web address] Password [email or web address] 06/05/2018, 10:14 AM

## 2018-06-05 NOTE — Progress Notes (Signed)
Initial Nutrition Assessment  DOCUMENTATION CODES:   Severe malnutrition in context of chronic illness  INTERVENTION:  Boost Breeze po TID, each supplement provides 250 kcal and 9 grams of protein  Snacks ordered  NUTRITION DIAGNOSIS:   Severe Malnutrition related to chronic illness(gastroparesis) as evidenced by energy intake < or equal to 75% for > or equal to 1 month, severe fat depletion, moderate muscle depletion, edema.  GOAL:   Patient will meet greater than or equal to 90% of their needs  MONITOR:   PO intake, I & O's, Labs, Supplement acceptance, Weight trends  REASON FOR ASSESSMENT:   Malnutrition Screening Tool    ASSESSMENT:   Patient with PMH gastroparesis, nephrolithiasis, s/p gastric bypass, and decrease in mental and functional status over the last 3-6 months. She has had multiple falls and hit her head on 7/23. Presents with severe sepsis secondary to suspected colitis, acute encephalopathy with facial droop and aphasia, and AKI. Patient has been dealing with progressively worsening slurred speech, right sided facial droop, and difficulty with her memory since June.   Spoke with patient at bedside. She reports that she has stopped eating a lot of food for a couple of years due to nausea and vomiting. Has considered getting her roux-en-y reversed but was told she is too weak to tolerate such a procedure at this time. On a normal day she might drink a dr pepper, eat a cheese stick, and a few shrimp. Patient reports a 150 pound weight loss in the past 2 years. Her weight drastically fluctuates due to fluid retention in her legs but her dry weight appears to be around 200 pounds. Per chart, she has fluctuated between 198-234 lbs over the past 2 years. Also continues to take bariatric vitamins and receive injections. Given her history of PO intake, severe fat depletions, moderate muscle wasting, and severe edema, patient meets criteria for severe malnutrition in context  of chronic illness.   Medications reviewed and include:  Seroquel Labs reviewed:  Na 133, TBili 12.2, Elevated LFTs    Intake/Output Summary (Last 24 hours) at 06/05/2018 1606 Last data filed at 06/05/2018 1404 Gross per 24 hour  Intake 1530 ml  Output 800 ml  Net 730 ml    NUTRITION - FOCUSED PHYSICAL EXAM:    Most Recent Value  Orbital Region  Severe depletion  Upper Arm Region  No depletion  Thoracic and Lumbar Region  No depletion  Buccal Region  Severe depletion  Temple Region  Moderate depletion  Clavicle Bone Region  Moderate depletion  Clavicle and Acromion Bone Region  Moderate depletion  Scapular Bone Region  Unable to assess  Dorsal Hand  Severe depletion  Patellar Region  Unable to assess  Anterior Thigh Region  Unable to assess  Posterior Calf Region  Unable to assess  Edema (RD Assessment)  Severe  Hair  Reviewed  Eyes  Reviewed  Mouth  Reviewed  Skin  Reviewed  Nails  Reviewed       Diet Order:   Diet Order           Diet Heart Room service appropriate? Yes; Fluid consistency: Thin  Diet effective now          EDUCATION NEEDS:   Education needs have been addressed  Skin:  Skin Assessment: Reviewed RN Assessment  Last BM:  PTA  Height:   Ht Readings from Last 1 Encounters:  06/05/18 5\' 7"  (1.702 m)    Weight:   Wt Readings from Last  1 Encounters:  06/05/18 229 lb 15 oz (104.3 kg)    Ideal Body Weight:  61.36 kg  BMI:  Body mass index is 36.01 kg/m.  Estimated Nutritional Needs:   Kcal:  1800-2000 calories  Protein:  113-130 grams  Fluid:  1.8-2.0L    Satira Anis. Hetal Proano, MS, RD LDN Inpatient Clinical Dietitian Pager [phone]

## 2018-06-05 NOTE — Progress Notes (Signed)
Foley 14 Fr was placed per MD order for Acute Urinary Retention. Patient tolerated well. 400 cc urine return. Will continue to monitor

## 2018-06-06 DIAGNOSIS — E43 Unspecified severe protein-calorie malnutrition: Secondary | ICD-10-CM

## 2018-06-06 LAB — HEPATIC FUNCTION PANEL
ALT: 59 U/L — AB (ref 0–44)
AST: 70 U/L — AB (ref 15–41)
Albumin: 1.6 g/dL — ABNORMAL LOW (ref 3.5–5.0)
Alkaline Phosphatase: 399 U/L — ABNORMAL HIGH (ref 38–126)
Bilirubin, Direct: 8.5 mg/dL — ABNORMAL HIGH (ref 0.0–0.2)
Indirect Bilirubin: 5.5 mg/dL — ABNORMAL HIGH (ref 0.3–0.9)
TOTAL PROTEIN: 3.6 g/dL — AB (ref 6.5–8.1)
Total Bilirubin: 14 mg/dL — ABNORMAL HIGH (ref 0.3–1.2)

## 2018-06-06 LAB — GLUCOSE, CAPILLARY
GLUCOSE-CAPILLARY: 111 mg/dL — AB (ref 70–99)
GLUCOSE-CAPILLARY: 90 mg/dL (ref 70–99)
Glucose-Capillary: 108 mg/dL — ABNORMAL HIGH (ref 70–99)
Glucose-Capillary: 121 mg/dL — ABNORMAL HIGH (ref 70–99)
Glucose-Capillary: 80 mg/dL (ref 70–99)
Glucose-Capillary: 88 mg/dL (ref 70–99)

## 2018-06-06 LAB — CBC WITH DIFFERENTIAL/PLATELET
BASOS ABS: 0 10*3/uL (ref 0.0–0.1)
Basophils Absolute: 0 K/uL (ref 0.0–0.1)
Basophils Relative: 0 %
Basophils Relative: 0 %
EOS ABS: 0 10*3/uL (ref 0.0–0.7)
Eosinophils Absolute: 0.1 K/uL (ref 0.0–0.7)
Eosinophils Relative: 0 %
Eosinophils Relative: 1 %
HCT: 38.2 % (ref 36.0–46.0)
HCT: 28.6 % — ABNORMAL LOW (ref 36.0–46.0)
Hemoglobin: 12.8 g/dL (ref 12.0–15.0)
Hemoglobin: 9.5 g/dL — ABNORMAL LOW (ref 12.0–15.0)
Lymphocytes Relative: 6 %
Lymphocytes Relative: 9 %
Lymphs Abs: 0.8 10*3/uL (ref 0.7–4.0)
Lymphs Abs: 1.1 K/uL (ref 0.7–4.0)
MCH: 31.4 pg (ref 26.0–34.0)
MCH: 31.1 pg (ref 26.0–34.0)
MCHC: 33.5 g/dL (ref 30.0–36.0)
MCHC: 33.2 g/dL (ref 30.0–36.0)
MCV: 93.9 fL (ref 78.0–100.0)
MCV: 93.8 fL (ref 78.0–100.0)
MONO ABS: 0.7 10*3/uL (ref 0.1–1.0)
Monocytes Absolute: 0.7 K/uL (ref 0.1–1.0)
Monocytes Relative: 5 %
Monocytes Relative: 6 %
NEUTROS PCT: 89 %
Neutro Abs: 11.9 10*3/uL — ABNORMAL HIGH (ref 1.7–7.7)
Neutro Abs: 9.9 K/uL — ABNORMAL HIGH (ref 1.7–7.7)
Neutrophils Relative %: 84 %
PLATELETS: 158 10*3/uL (ref 150–400)
Platelets: DECREASED K/uL (ref 150–400)
RBC: 4.07 MIL/uL (ref 3.87–5.11)
RBC: 3.05 MIL/uL — ABNORMAL LOW (ref 3.87–5.11)
RDW: 16.4 % — AB (ref 11.5–15.5)
RDW: 17.9 % — ABNORMAL HIGH (ref 11.5–15.5)
WBC: 13.4 10*3/uL — AB (ref 4.0–10.5)
WBC: 11.8 K/uL — ABNORMAL HIGH (ref 4.0–10.5)

## 2018-06-06 LAB — BASIC METABOLIC PANEL WITH GFR
Anion gap: 11 (ref 5–15)
BUN: 18 mg/dL (ref 6–20)
CO2: 20 mmol/L — ABNORMAL LOW (ref 22–32)
Calcium: 7.7 mg/dL — ABNORMAL LOW (ref 8.9–10.3)
Chloride: 103 mmol/L (ref 98–111)
Creatinine, Ser: 1.17 mg/dL — ABNORMAL HIGH (ref 0.44–1.00)
GFR calc Af Amer: >60 mL/min
GFR calc non Af Amer: 54 mL/min — ABNORMAL LOW
Glucose, Bld: 81 mg/dL (ref 70–99)
Potassium: 4.1 mmol/L (ref 3.5–5.1)
Sodium: 134 mmol/L — ABNORMAL LOW (ref 135–145)

## 2018-06-06 LAB — HEPATITIS PANEL, ACUTE
HEP B C IGM: NEGATIVE
HEP B S AG: NEGATIVE
Hep A IgM: NEGATIVE

## 2018-06-06 NOTE — Progress Notes (Signed)
[facility] Gastroenterology Progress Note  Jordan Russell 47 y.o. December 20, 1970   Subjective: Complaining of abdominal pain. Lying in bed with physical therapy in room. Husband at bedside.  Objective: Vital signs: Vitals:   06/06/18 1140 06/06/18 1205  BP: 111/71   Pulse: 99   Resp: (!) 22   Temp:  98.4 F (36.9 C)  SpO2: 91%     Physical Exam: Gen: lethargic, no acute distress, obese HEENT: +icteric sclera CV: RRR Chest: CTA B Abd: diffuse tenderness with guarding, soft, nondistended, +BS Ext: 3+ pitting LE edema  Lab Results: Recent Labs    06/05/18 1219 06/06/18 0650  NA 133* 134*  K 4.3 4.1  CL 103 103  CO2 19* 20*  GLUCOSE 75 81  BUN 19 18  CREATININE 1.46* 1.17*  CALCIUM 7.4* 7.7*   Recent Labs    06/04/18 1618 06/05/18 1219  AST 91* 61*  ALT 91* 63*  ALKPHOS 540* 358*  BILITOT 14.3* 12.2*  PROT 4.3* 3.5*  ALBUMIN 1.9* 1.7*   Recent Labs    06/04/18 1618 06/04/18 1646 06/06/18 0650  WBC 13.4*  --  11.8*  NEUTROABS 11.9*  --  9.9*  HGB 12.8 12.2 9.5*  HCT 38.2 36.0 28.6*  MCV 93.9  --  93.8  PLT 158  158  --  PLATELET CLUMPS NOTED ON SMEAR, COUNT APPEARS DECREASED      Assessment/Plan: Elevated liver enzymes likely due to a medication; previous workup as outpt by Dr. Therisa Doyne including liver biopsy has been negative for primary biliary cholangitis or other intrahepatic biliary disease. I am NOT convinced that she has acute colitis and think her CT findings of her colon are due to her malnourished state with her severely low albumin. Recheck autoimmune markers that were negative as an outpt. Recheck LFTs. She will need close f/u as an outpt with her primary doctor and her rheumatologist to see if any her hepatotoxic meds can be stopped or changed. Continue supportive care. No new GI recs. Will follow.   [place] C. 06/06/2018, 2:46 PM  Questions please call [phone] ID: Jordan Russell, female   DOB: 06-17-1971, 47 y.o.    MRN: [id number]

## 2018-06-06 NOTE — Progress Notes (Signed)
PROGRESS NOTE    Jordan Russell  LFY:[id number] DOB: 12/24/1970 DOA: 06/04/2018 PCP: Josetta Huddle, MD    Brief Narrative:  47 year old female who presented with altered mental status.  She has significant past medical from her arthritis, bipolar disease, hyperparathyroidism, gastric varices status post gastric bypass who presents with acute worsening altered mentation.  Positive for edema of the lower extremities for the last 6 months, significant decreased p.o. intake for the last 3 years.  Unable to ambulate due to severe edema of lower extremities.  Reported abdominal pain and jaundice for the last few days prior to hospitalization.  Physical examination blood pressure 100/45, 97/54 pulse rate 104, respiratory rate 21, temperature 97.7, oxygen saturation 78%.  She had decreased breath sounds bilaterally, heart S1-S2 present and rhythmic, abdomen protuberant, +4+ pitting bilaterally.  No skin rashes, positive jaundice.  Sodium 132, potassium 4.2, chloride 98, bicarb 19, glucose 59, BUN 18, creatinine 1.25, anion gap 15, alkaline phosphatase 540, albumin 1.9, lipase 20, AST 91, ALT 91, ammonia 28, total bilirubin is 14.3, white count 13.4, hemoglobin 12.8, hematocrit 38.2 platelets 158.  Urinalysis 0-5 white cells.  Positive choluria.  Drug screen positive for benzodiazepines.  Alcohol level less than 10.  Chest x-ray hypoinflated, no significant infiltrates.  EKG normal sinus rhythm, normal intervals, axis.  CT of the abdomen with patchy areas of colonic wall thickening, ascending colon and cecum consistent with colitis.  Head CT negative for acute changes.  Patient  was admitted to the hospital with the working diagnosis of possible sepsis.   Assessment & Plan:   Principal Problem:   Severe sepsis ([place]) Active Problems:   Bipolar disorder ([place])   Anxiety   Hypoglycemia   S/P gastric bypass   Acute encephalopathy   Hypoxia   Hyperbilirubinemia   Fatty infiltration of liver   AKI  (acute kidney injury) ([place])   Protein-calorie malnutrition, severe   1. Sepsis due to acute colitis complicated with gram negative bacteremia/ E coli (present on admission). Tolerating well antibiotic therapy with aztreonam, patient has remained afebrile, WBC down to 11 from 13. Possible colitis source of bacteremia.   2. Acute liver failure with direct Hyperbilirubinemia. Liver biopsy was normal, possible medication induced, will dc plaquenil for now. AST at 70. ALT at 59. Bilirubins continue to trend up, now up to 14. Will check INR in am. Patient with fatigue but not encephalopathic.   4. AKI. Serum creatinine down to 1,17 with K at 4,1 and serum bicarbonate at 20, will continue to follow on renal panel in am, will hold on IV fluids, once more stable will resume diuresis for edema, likely related to liver failure.    5. RA. No active pain, will hold on plaquenil due to potential liver toxicity.   6. Sever malnutrition. Will continue nutritional supplements with Boost Breeeze per nutrition recommendations.   DVT prophylaxis: scd   Code Status: full Family Communication: I spoke with patient's family at the bedside and all questions were addressed.  Disposition Plan/ discharge barriers: pending clinical improvement    Consultants:   GI   Procedures:     Antimicrobials:    Subjective: Patient continue to be very weak and deconditioned, no nausea or vomiting, persistent lower extremity edema.   Objective: Vitals:   06/06/18 0743 06/06/18 0838 06/06/18 1140 06/06/18 1205  BP:  118/63 111/71   Pulse:  99 99   Resp:  (!) 21 (!) 22   Temp: 97.7 F (36.5 C)   98.4  F (36.9 C)  TempSrc: Oral   Oral  SpO2:  91% 91%   Weight:      Height:        Intake/Output Summary (Last 24 hours) at 06/06/2018 1541 Last data filed at 06/06/2018 0500 Gross per 24 hour  Intake -  Output 1200 ml  Net -1200 ml   Filed Weights   06/04/18 1511 06/05/18 0315 06/06/18 0338  Weight:  94.8 kg (209 lb) 104.3 kg (229 lb 15 oz) 107.7 kg (237 lb 7 oz)    Examination:   General: Not in pain or dyspnea, deconditioing Neurology: Awake and alert, non focal  E ENT: positive pallor, positive icterus, oral mucosa moist Cardiovascular: No JVD. S1-S2 present, rhythmic, no gallops, rubs, or murmurs. ++++ pitting lower extremity edema. Pulmonary: decreased breath sounds bilaterally, adequate air movement, no wheezing, rhonchi or rales. Gastrointestinal. Abdomen distended with no organomegaly, non tender, no rebound or guarding Skin. No rashes Musculoskeletal: no joint deformities     Data Reviewed: I have personally reviewed following labs and imaging studies  CBC: Recent Labs  Lab 05/31/18 1008 06/04/18 1618 06/04/18 1646 06/06/18 0650  WBC 10.0 13.4*  --  11.8*  NEUTROABS 7.7 11.9*  --  9.9*  HGB 12.6 12.8 12.2 9.5*  HCT 36.3 38.2 36.0 28.6*  MCV 91.2 93.9  --  93.8  PLT 225 158  158  --  PLATELET CLUMPS NOTED ON SMEAR, COUNT APPEARS DECREASED   Basic Metabolic Panel: Recent Labs  Lab 05/31/18 1008 06/04/18 1618 06/04/18 1646 06/05/18 1219 06/06/18 0650  NA 137 132* 133* 133* 134*  K 3.3* 4.2 4.2 4.3 4.1  CL 103 98 101 103 103  CO2 25 19*  --  19* 20*  GLUCOSE 88 59* 50* 75 81  BUN 10 18 18 19 18   CREATININE 0.56 1.25* 1.50* 1.46* 1.17*  CALCIUM 8.2* 8.0*  --  7.4* 7.7*   GFR: Estimated Creatinine Clearance: 74.3 mL/min (A) (by C-G formula based on SCr of 1.17 mg/dL (H)). Liver Function Tests: Recent Labs  Lab 06/04/18 1618 06/05/18 1219  AST 91* 61*  ALT 91* 63*  ALKPHOS 540* 358*  BILITOT 14.3* 12.2*  PROT 4.3* 3.5*  ALBUMIN 1.9* 1.7*   Recent Labs  Lab 06/04/18 1618  LIPASE 20   Recent Labs  Lab 06/04/18 1618  AMMONIA 28   Coagulation Profile: Recent Labs  Lab 06/04/18 1618  INR 1.30   Cardiac Enzymes: Recent Labs  Lab 06/04/18 1618 06/04/18 2258  TROPONINI 0.04* 0.04*   BNP (last 3 results) No results for input(s):  PROBNP in the last 8760 hours. HbA1C: No results for input(s): HGBA1C in the last 72 hours. CBG: Recent Labs  Lab 06/05/18 2011 06/06/18 0014 06/06/18 0530 06/06/18 0744 06/06/18 1205  GLUCAP 94 111* 80 121* 90   Lipid Profile: No results for input(s): CHOL, HDL, LDLCALC, TRIG, CHOLHDL, LDLDIRECT in the last 72 hours. Thyroid Function Tests: Recent Labs    06/05/18 0714  TSH 7.063*   Anemia Panel: No results for input(s): VITAMINB12, FOLATE, FERRITIN, TIBC, IRON, RETICCTPCT in the last 72 hours.    Radiology Studies: I have reviewed all of the imaging during this hospital visit personally     Scheduled Meds: . enoxaparin (LOVENOX) injection  40 mg Subcutaneous QHS  . feeding supplement  1 Container Oral TID BM  . hydroxychloroquine  200 mg Oral BID  . levothyroxine  50 mcg Oral QAC breakfast  . pantoprazole  40 mg Oral  BID  . QUEtiapine  600 mg Oral QHS  . topiramate  50 mg Oral BID   Continuous Infusions: . aztreonam 2 g (06/06/18 1347)     LOS: 2 days        Glendon Fiser Gerome Apley, MD [facility] Pager [phone]

## 2018-06-06 NOTE — Evaluation (Addendum)
Physical Therapy Evaluation Patient Details Name: Jordan Russell MRN: [id number] DOB: 1970/11/22 Today's Date: 06/06/2018   History of Present Illness  Jordan Russell isa 47 y.o. F who presented to the ED with increased BLE swelling, yellowing of the skin, and nausea. Previous visit 7/23 for a fall and hitting her head. PMH includes Bipolar disorder, anixiety, fatty infiltration of the liver, AKI, RA, IBS, Colitis, Encephalopathy, Hyperbilirubinism, hyperparathyroidism s/p thyroidectomy, gastroparesis, nephrolithiasis, s/p gastric bypass.  Clinical Impression  Pt presents with problems above and deficits below. Pt performed supine HEP. Pt performed bed mobility and partial supine<>sit transfers with ModA+2. Pt reported increased nausea and pain with bed mobility; further mobility deferred. On 3L of O2, pt's O2 sats dropped to 85% during bed mobility, but returned to 91% upon returning to supine, and required extended time to recover. Pt is at increased risk for falls and has limited ability to perform ADLs. Will continue to follow acutely to support mobility, independence, and safety.     Follow Up Recommendations Supervision/Assistance - 24 hour;SNF    Equipment Recommendations  None recommended by PT    Recommendations for Other Services OT consult     Precautions / Restrictions Precautions Precautions: Fall;Other (comment) Precaution Comments: Watch O2 sats Restrictions Weight Bearing Restrictions: No      Mobility  Bed Mobility Overal bed mobility: Needs Assistance Bed Mobility: Rolling;Sidelying to Sit;Sit to Supine Rolling: +2 for physical assistance;Min assist Sidelying to sit: Mod assist;+2 for physical assistance   Sit to supine: Mod assist;+2 for physical assistance   General bed mobility comments: Pt required increased time, use of rails, and Min-ModA+2 to sit at EOB. Pt was able to move LE off the bed, and used rails to roll trunk some. Pt required MinA+2 to  complete rolling, and Mod+2 for trunk elevation and LE assist to sit at EOB. Pt reported increased nausea with partial-upright sitting. Pt tolerated partial upright sitting for less than 5 seconds before requesting to return to supine. O2 sats dropped to 85% on 3L with bed mobility. O2 sats returned to 91% on 3L with increased time once pt returned to supine. Pt reported increased difficulty breathing when laid flat, so HOB elevated upon completion of session.   Transfers                 General transfer comment: Not safe to attempt  Ambulation/Gait             General Gait Details: Not safe to attempt  Stairs            Wheelchair Mobility    Modified Rankin (Stroke Patients Only)       Balance Overall balance assessment: Needs assistance Sitting-balance support: Bilateral upper extremity supported;Feet supported Sitting balance-Leahy Scale: Poor Sitting balance - Comments: Pt heavily reliant on external assist for balance during partial sitting at EOB.                                      Pertinent Vitals/Pain Pain Assessment: 0-10 Pain Score: 5  Pain Location: Tops of feet, abdomen Pain Descriptors / Indicators: Grimacing;Guarding Pain Intervention(s): Limited activity within patient's tolerance;Monitored during session;Repositioned    Home Living Family/patient expects to be discharged to:: Private residence Living Arrangements: Spouse/significant other Available Help at Discharge: Available PRN/intermittently;Available 24 hours/day(Husband is taking off work currently. Will return on 8/11) Type of Home: House Home Access: Stairs to enter  Entrance Stairs-Rails: None Entrance Stairs-Number of Steps: 2 in garage, 8 out front Home Layout: One level Home Equipment: Walker - 2 wheels;Cane - single point;Shower seat;Grab bars - tub/shower;Hand held shower head      Prior Function Level of Independence: Needs assistance   Gait / Transfers  Assistance Needed: Pt has not be able to ambulate recently and largely bed bound. Prior to that, used RW.   ADL's / Homemaking Assistance Needed: Pt has not been able to toilet or get dressed independently for some time.        Hand Dominance   Dominant Hand: Right    Extremity/Trunk Assessment   Upper Extremity Assessment Upper Extremity Assessment: Defer to OT evaluation    Lower Extremity Assessment Lower Extremity Assessment: LLE deficits/detail;RLE deficits/detail;Generalized weakness RLE Deficits / Details: Pt had increased difficulty dorsiflexing R ankle, and increased pain behind R Knee when attempting heel slides. R LE seems weaker than L LE.   LLE Deficits / Details: Pt able to perform heel slides, quad sets, and ankle pumps. Pt had difficulty achieving full range of heel slides, but has improved ROM compared to R LE.     Cervical / Trunk Assessment Cervical / Trunk Assessment: Normal  Communication   Communication: No difficulties  Cognition Arousal/Alertness: Awake/alert Behavior During Therapy: WFL for tasks assessed/performed Overall Cognitive Status: Within Functional Limits for tasks assessed                                 General Comments: Pt answers questions appropriately, but seems tired; frequently closes eyes.       General Comments General comments (skin integrity, edema, etc.): Pt is pleasant. Husband in the room during session. Physician visited during session. On 3L of O2, pt's O2 sats dropped to 85% during bed mobility, but returned to 91% upon returning to supine; required extended time to recover.     Exercises General Exercises - Upper Extremity Shoulder Flexion: AROM;Both;Supine;Limitations;5 reps Shoulder Flexion Limitations: Pt was not able to bring arms all the way over head; stopped at ~80 degrees.  General Exercises - Lower Extremity Ankle Circles/Pumps: AROM;Both;20 reps;Supine Quad Sets: AROM;Both;10 reps;Supine Gluteal  Sets: AROM;Both;10 reps;Supine Heel Slides: 10 reps;Supine;Limitations;AAROM;Left;Other (comment)(R LEx2) Heel Slides Limitations: Pt could not complete 10 reps on R LE; pt reported increased pain after 2 reps, so PT deferred. Pt had no such difficulty on L LE.     Assessment/Plan    PT Assessment Patient needs continued PT services  PT Problem List Decreased strength;Decreased range of motion;Decreased activity tolerance;Decreased balance;Decreased mobility;Decreased knowledge of use of DME;Pain;Cardiopulmonary status limiting activity       PT Treatment Interventions DME instruction;Gait training;Functional mobility training;Therapeutic exercise;Therapeutic activities;Balance training;Patient/family education    PT Goals (Current goals can be found in the Care Plan section)  Acute Rehab PT Goals Patient Stated Goal: To go home PT Goal Formulation: With patient Time For Goal Achievement: 06/20/18 Potential to Achieve Goals: Fair    Frequency Min 2X/week   Barriers to discharge        Co-evaluation               AM-PAC PT "6 Clicks" Daily Activity  Outcome Measure Difficulty turning over in bed (including adjusting bedclothes, sheets and blankets)?: Unable Difficulty moving from lying on back to sitting on the side of the bed? : Unable Difficulty sitting down on and standing up from a chair with arms (e.g.,  wheelchair, bedside commode, etc,.)?: Unable Help needed moving to and from a bed to chair (including a wheelchair)?: A Lot Help needed walking in hospital room?: A Lot Help needed climbing 3-5 steps with a railing? : Total 6 Click Score: 8    End of Session Equipment Utilized During Treatment: Oxygen Activity Tolerance: Patient limited by pain;Treatment limited secondary to medical complications (Comment)(Pt limited by increased nausea as well) Patient left: in bed;with bed alarm set;with call bell/phone within reach;with family/visitor present Nurse Communication:  Mobility status PT Visit Diagnosis: Unsteadiness on feet (R26.81);Other abnormalities of gait and mobility (R26.89);Muscle weakness (generalized) (M62.81);History of falling (Z91.81);Difficulty in walking, not elsewhere classified (R26.2);Pain Pain - Right/Left: Right Pain - part of body: Knee;Ankle and joints of foot(Tops of the feet, stomach)    Time: 9450-3888 PT Time Calculation (min) (ACUTE ONLY): 34 min   Charges:   PT Evaluation $PT Eval Moderate Complexity: 1 Mod PT Treatments $Therapeutic Exercise: 8-22 mins      Elwin Mocha, S-DPT Acute Care Rehab Student [phone]   06/06/2018, 3:16 PM

## 2018-06-07 LAB — HEPATIC FUNCTION PANEL
ALBUMIN: 1.6 g/dL — AB (ref 3.5–5.0)
ALK PHOS: 490 U/L — AB (ref 38–126)
ALT: 63 U/L — AB (ref 0–44)
AST: 87 U/L — ABNORMAL HIGH (ref 15–41)
BILIRUBIN INDIRECT: 5.9 mg/dL — AB (ref 0.3–0.9)
Bilirubin, Direct: 9.4 mg/dL — ABNORMAL HIGH (ref 0.0–0.2)
TOTAL PROTEIN: 3.8 g/dL — AB (ref 6.5–8.1)
Total Bilirubin: 15.3 mg/dL — ABNORMAL HIGH (ref 0.3–1.2)

## 2018-06-07 LAB — CULTURE, BLOOD (ROUTINE X 2)

## 2018-06-07 LAB — CBC WITH DIFFERENTIAL/PLATELET
BAND NEUTROPHILS: 9 %
BASOS ABS: 0 10*3/uL (ref 0.0–0.1)
BLASTS: 0 %
Basophils Relative: 0 %
EOS ABS: 0 10*3/uL (ref 0.0–0.7)
Eosinophils Relative: 0 %
HCT: 29.7 % — ABNORMAL LOW (ref 36.0–46.0)
HEMOGLOBIN: 10 g/dL — AB (ref 12.0–15.0)
LYMPHS PCT: 12 %
Lymphs Abs: 1.9 10*3/uL (ref 0.7–4.0)
MCH: 31.3 pg (ref 26.0–34.0)
MCHC: 33.7 g/dL (ref 30.0–36.0)
MCV: 93.1 fL (ref 78.0–100.0)
MONO ABS: 0.8 10*3/uL (ref 0.1–1.0)
Metamyelocytes Relative: 2 %
Monocytes Relative: 5 %
Myelocytes: 2 %
Neutro Abs: 13.5 10*3/uL — ABNORMAL HIGH (ref 1.7–7.7)
Neutrophils Relative %: 70 %
OTHER: 0 %
PLATELETS: 89 10*3/uL — AB (ref 150–400)
PROMYELOCYTES RELATIVE: 0 %
RBC: 3.19 MIL/uL — ABNORMAL LOW (ref 3.87–5.11)
RDW: 17.2 % — ABNORMAL HIGH (ref 11.5–15.5)
Smear Review: DECREASED
WBC: 16.2 10*3/uL — ABNORMAL HIGH (ref 4.0–10.5)
nRBC: 0 /100 WBC

## 2018-06-07 LAB — BASIC METABOLIC PANEL
ANION GAP: 11 (ref 5–15)
BUN: 13 mg/dL (ref 6–20)
CHLORIDE: 103 mmol/L (ref 98–111)
CO2: 20 mmol/L — ABNORMAL LOW (ref 22–32)
Calcium: 7.6 mg/dL — ABNORMAL LOW (ref 8.9–10.3)
Creatinine, Ser: 0.72 mg/dL (ref 0.44–1.00)
GFR calc Af Amer: >60 mL/min (ref >60)
Glucose, Bld: 81 mg/dL (ref 70–99)
POTASSIUM: 3.4 mmol/L — AB (ref 3.5–5.1)
SODIUM: 134 mmol/L — AB (ref 135–145)

## 2018-06-07 LAB — ANTINUCLEAR ANTIBODIES, IFA: ANTINUCLEAR ANTIBODIES, IFA: NEGATIVE

## 2018-06-07 LAB — GLUCOSE, CAPILLARY
GLUCOSE-CAPILLARY: 101 mg/dL — AB (ref 70–99)
GLUCOSE-CAPILLARY: 119 mg/dL — AB (ref 70–99)

## 2018-06-07 LAB — PATHOLOGIST SMEAR REVIEW

## 2018-06-07 LAB — MITOCHONDRIAL ANTIBODIES

## 2018-06-07 LAB — ANTI-SMOOTH MUSCLE ANTIBODY, IGG: F-ACTIN AB IGG: 1 U (ref 0–19)

## 2018-06-07 LAB — PROTIME-INR
INR: 1.31
Prothrombin Time: 16.2 seconds — ABNORMAL HIGH (ref 11.4–15.2)

## 2018-06-07 MED ORDER — FUROSEMIDE 40 MG PO TABS
40.0000 mg | ORAL_TABLET | Freq: Every day | ORAL | Status: DC
  Administered 2018-06-07: 40 mg via ORAL
  Filled 2018-06-07: qty 1

## 2018-06-07 MED ORDER — CIPROFLOXACIN HCL 500 MG PO TABS
500.0000 mg | ORAL_TABLET | Freq: Two times a day (BID) | ORAL | Status: DC
  Administered 2018-06-07: 500 mg via ORAL
  Filled 2018-06-07 (×2): qty 1

## 2018-06-07 MED ORDER — POTASSIUM CHLORIDE CRYS ER 20 MEQ PO TBCR
40.0000 meq | EXTENDED_RELEASE_TABLET | Freq: Once | ORAL | Status: AC
  Administered 2018-06-07: 40 meq via ORAL
  Filled 2018-06-07: qty 2

## 2018-06-07 NOTE — Plan of Care (Signed)

## 2018-06-07 NOTE — Evaluation (Signed)
Occupational Therapy Evaluation Patient Details Name: Jordan Russell MRN: [id number] DOB: Jul 16, 1971 Today's Date: 06/07/2018    History of Present Illness Jordan Russell isa 47 y.o. F who presented to the ED with increased BLE swelling, yellowing of the skin, and nausea. Previous visit 7/23 for a fall and hitting her head. PMH includes Bipolar disorder, anixiety, fatty infiltration of the liver, AKI, RA, IBS, Colitis, Encephalopathy, Hyperbilirubinism, hyperparathyroidism s/p thyroidectomy, gastroparesis, nephrolithiasis, s/p gastric bypass.   Clinical Impression   Patient presenting with decreased I in self care, balance, functional transfer, endurance, and safety. Patient's caregivers have reported to other staff , " She has needed help for awhile". PTA. Patient currently functioning and +2 for all aspects of care with increased pain. Patient will benefit from acute OT to increase overall independence in the areas of ADLs, functional mobility, and safety awareness in order to safely discharge to next venue of care.    Follow Up Recommendations  SNF;Supervision/Assistance - 24 hour    Equipment Recommendations  None recommended by OT(defer to next venue of care)    Recommendations for Other Services Other (comment)(none at this time)     Precautions / Restrictions Precautions Precautions: Fall;Other (comment) Precaution Comments: Watch O2 sats Restrictions Weight Bearing Restrictions: No      Mobility Bed Mobility Overal bed mobility: Needs Assistance Bed Mobility: Rolling Rolling: Total assist         General bed mobility comments: total A of 1 for rolling for repositioning  Transfers    General transfer comment: Not safe to attempt        ADL either performed or assessed with clinical judgement   ADL Overall ADL's : Needs assistance/impaired   General ADL Comments: +2 for self care from bed level secondary to current impairments and safety     Vision  Patient Visual Report: No change from baseline              Pertinent Vitals/Pain Pain Assessment: Faces Faces Pain Scale: Hurts little more Pain Location: generalized Pain Descriptors / Indicators: Grimacing;Guarding Pain Intervention(s): Limited activity within patient's tolerance;Monitored during session;Repositioned     Hand Dominance Right   Extremity/Trunk Assessment Upper Extremity Assessment Upper Extremity Assessment: Generalized weakness   Lower Extremity Assessment Lower Extremity Assessment: Defer to PT evaluation   Cervical / Trunk Assessment Cervical / Trunk Assessment: Normal   Communication Communication Communication: No difficulties   Cognition Arousal/Alertness: Lethargic Behavior During Therapy: Flat affect Overall Cognitive Status: Impaired/Different from baseline Area of Impairment: Following commands;Safety/judgement;Attention;Orientation       Orientation Level: Disoriented to;Time;Situation     Following Commands: Follows one step commands inconsistently                      Home Living Family/patient expects to be discharged to:: Private residence Living Arrangements: Spouse/significant other Available Help at Discharge: Available PRN/intermittently;Available 24 hours/day Type of Home: House Home Access: Stairs to enter [employer] of Steps: 2 in garage, 8 out front Entrance Stairs-Rails: None Home Layout: One level     Bathroom Shower/Tub: Walk-in shower;Tub/shower unit   Bathroom Toilet: Handicapped height Bathroom Accessibility: Yes   Home Equipment: Walker - 2 wheels;Cane - single point;Shower seat;Grab bars - tub/shower;Hand held shower head          Prior Functioning/Environment Level of Independence: Needs assistance  Gait / Transfers Assistance Needed: Pt has not be able to ambulate recently and largely bed bound. Prior to that, used RW.  ADL's /  Homemaking Assistance Needed: Pt has not been able to  toilet or get dressed independently for some time.            OT Problem List: Decreased strength;Impaired balance (sitting and/or standing);Decreased cognition;Pain;Decreased safety awareness;Increased edema;Decreased activity tolerance;Decreased coordination;Impaired sensation;Decreased knowledge of use of DME or AE      OT Treatment/Interventions: Self-care/ADL training;Manual therapy;Therapeutic exercise;Patient/family education;Neuromuscular education;Balance training;Energy conservation;Therapeutic activities;Cognitive remediation/compensation    OT Goals(Current goals can be found in the care plan section) Acute Rehab OT Goals Patient Stated Goal: none stated ADL Goals Pt Will Perform Grooming: with set-up Pt Will Perform Upper Body Bathing: with min assist Pt Will Perform Lower Body Bathing: with mod assist Pt Will Perform Upper Body Dressing: with min assist Pt Will Perform Lower Body Dressing: with mod assist Pt Will Transfer to Toilet: with mod assist Pt Will Perform Toileting - Clothing Manipulation and hygiene: with mod assist  OT Frequency: Min 2X/week   Barriers to D/C: Decreased caregiver support;Inaccessible home environment             AM-PAC PT "6 Clicks" Daily Activity     Outcome Measure Help from another person eating meals?: A Lot Help from another person taking care of personal grooming?: A Lot Help from another person toileting, which includes using toliet, bedpan, or urinal?: Total Help from another person bathing (including washing, rinsing, drying)?: Total Help from another person to put on and taking off regular upper body clothing?: Total Help from another person to put on and taking off regular lower body clothing?: Total 6 Click Score: 8   End of Session    Activity Tolerance: Patient limited by lethargy;Patient limited by fatigue Patient left: in bed;with call bell/phone within reach;with bed alarm set  OT Visit Diagnosis: Muscle weakness  (generalized) (M62.81)                Time: 6701-4103 OT Time Calculation (min): 10 min Charges:  OT General Charges $OT Visit: 1 Visit OT Evaluation $OT Eval Moderate Complexity: 1 Mod  Barnes Florek P 06/07/2018, 3:50 PM

## 2018-06-07 NOTE — Progress Notes (Signed)
[facility] Gastroenterology Progress Note  Jordan Russell 47 y.o. Mar 01, 1971   Subjective: More lethargic today. Son and husband in room.  Objective: Vital signs: Vitals:   06/07/18 0629 06/07/18 0743  BP:    Pulse:    Resp:    Temp: 97.7 F (36.5 C) 97.7 F (36.5 C)  SpO2:      Physical Exam: Gen: lethargic, no acute distress, obese HEENT: +icteric sclera CV: RRR Chest: CTA B Abd: diffuse tenderness with guarding, +distention, decreased bowel sounds Ext: 3+ pitting LE edema  Lab Results: Recent Labs    06/06/18 0650 06/07/18 0424  NA 134* 134*  K 4.1 3.4*  CL 103 103  CO2 20* 20*  GLUCOSE 81 81  BUN 18 13  CREATININE 1.17* 0.72  CALCIUM 7.7* 7.6*   Recent Labs    06/06/18 1438 06/07/18 0424  AST 70* 87*  ALT 59* 63*  ALKPHOS 399* 490*  BILITOT 14.0* 15.3*  PROT 3.6* 3.8*  ALBUMIN 1.6* 1.6*   Recent Labs    06/06/18 0650 06/07/18 0424  WBC 11.8* 16.2*  NEUTROABS 9.9* 13.5*  HGB 9.5* 10.0*  HCT 28.6* 29.7*  MCV 93.8 93.1  PLT PLATELET CLUMPS NOTED ON SMEAR, COUNT APPEARS DECREASED 89*      Assessment/Plan: Jaundice likely due to drug (med) induced liver injury. Autoimmune markers pending (negative as outpt in past). Gram negative bacteremia on Abx. Anasarca and severe weakness managed by primary team. No new GI recs. Will sign off. Call us back if needed. F/U with Dr. Therisa Doyne from [facility] in 3-4 weeks after discharge.   Dudley C. 06/07/2018, 1:35 PM  Questions please call [phone] ID: Jordan Russell, female   DOB: [birth year], 47 y.o.   MRN: [id number]

## 2018-06-07 NOTE — Clinical Social Work Note (Signed)
Clinical Social Work Assessment  Patient Details  Name: Jordan Russell MRN: [id number] Date of Birth: August 06, 1971  Date of referral:  06/07/18               Reason for consult:  Facility Placement                Permission sought to share information with:  Facility Sport and exercise psychologist, Family Supports Permission granted to share information::  Yes, Verbal Permission Granted  Name::     Armed forces technical officer::  SNFs  Relationship::  Spouse  Contact Information:  [phone]  Housing/Transportation Living arrangements for the past 2 months:  [place] of Information:  Patient, Spouse Patient Interpreter Needed:  None Criminal Activity/Legal Involvement Pertinent to Current Situation/Hospitalization:  No - Comment as needed Significant Relationships:  Spouse Lives with:  Spouse Do you feel safe going back to the place where you live?  No Need for family participation in patient care:  Yes (Comment)  Care giving concerns:  CSW received consult for possible SNF placement at time of discharge. CSW spoke with patient but she was very lethargic and had difficulty keeping her eyes open. CSW spoke with patient regarding PT recommendation of SNF placement at time of discharge. Patient reported that patient's spouse is currently unable to care for patient at their home given patient's current physical needs and fall risk. Patient expressed understanding of PT recommendation and is agreeable to SNF placement at time of discharge. CSW later was asked to return to the patient's room to also speak with her husband, which CSW did. CSW to continue to follow and assist with discharge planning needs.   Social Worker assessment / plan:  CSW spoke with patient and spouse concerning possibility of rehab at [facility] before returning home.  Employment status:  Therapist, music:  Managed Care PT Recommendations:  [place] / Referral to community  resources:  [place]  Patient/Family's Response to care:  Patient recognizes need for rehab before returning home and is agreeable to a SNF in [place]. Patient's spouse reviewed the bed offers and reported a preference for 1-[facility] 2-[facility]. He is aware of need for insurance pre-authorization. CSW also discussed transportation options with spouse at time of discharge.  Patient/Family's Understanding of and Emotional Response to Diagnosis, Current Treatment, and Prognosis:  Patient/family is realistic regarding therapy needs and expressed being hopeful for SNF placement. Patient and spouse expressed understanding of CSW role and discharge process as well as medical condition. No questions/concerns about plan or treatment.    Emotional Assessment Appearance:  Appears stated age Attitude/Demeanor/Rapport:  Lethargic Affect (typically observed):  Accepting(Very sleepy ) Orientation:  Oriented to Self, Oriented to Place, Oriented to Situation Alcohol / Substance use:  Not Applicable Psych involvement (Current and /or in the community):  No (Comment)  Discharge Needs  Concerns to be addressed:  Care Coordination Readmission within the last 30 days:  No Current discharge risk:  Chronically ill, Dependent with Mobility Barriers to Discharge:  Continued Medical Work up   [employer], [place] 06/07/2018, 3:34 PM

## 2018-06-07 NOTE — NC FL2 (Signed)
[place] MEDICAID FL2 LEVEL OF CARE SCREENING TOOL     IDENTIFICATION  Patient Name: Jordan Russell Birthdate: August 12, 1971 Sex: female Admission Date (Current Location): 06/04/2018  [facility] and [place] Number:  Herbalist and Address:  [facility] [place] [place] [address] [place], [place] [address]      Provider Number: [id number]  Attending Physician Name and Address:  Shelly Coss, MD  Relative Name and Phone Number:  Abe People, spouse, [phone]    Current Level of Care: Hospital Recommended Level of Care: [place] Prior Approval Number:    Date Approved/Denied:   PASRR Number:    Discharge Plan: SNF    Current Diagnoses: Patient Active Problem List   Diagnosis Date Noted  . Acute encephalopathy 06/05/2018  . Hypoxia 06/05/2018  . Hyperbilirubinemia 06/05/2018  . Fatty infiltration of liver 06/05/2018  . AKI (acute kidney injury) ([place]) 06/05/2018  . Protein-calorie malnutrition, severe 06/05/2018  . Severe sepsis ([place]) 06/04/2018  . Trochanteric bursitis, left hip 09/08/2017  . Pain in left hip 09/08/2017  . Asthma with acute exacerbation 12/17/2016  . Carpal tunnel syndrome of left wrist 12/17/2016  . Dysphagia, pharyngoesophageal phase 12/17/2016  . Fibromyalgia 12/17/2016  . H/O vitamin D deficiency 12/17/2016  . Hidradenitis suppurativa 12/17/2016  . Calculus of kidney 12/17/2016  . Hypoglycemia 12/17/2016  . Hyperglycemia 12/17/2016  . Hydronephrosis with renal and ureteral calculus obstruction 12/17/2016  . Hydradenitis 12/17/2016  . Hx of iron deficiency anemia 12/17/2016  . HPTH (hyperparathyroidism) ([place]) 12/17/2016  . Nocturia 12/17/2016  . Intractable migraine with aura without status migrainosus 12/17/2016  . Pain in left knee 12/17/2016  . Arthralgia of multiple joints 12/17/2016  . Avitaminosis D 12/17/2016  . Anemia, iron deficiency 12/17/2016  . Cutaneous eruption 12/17/2016  . Pain in  joint 12/17/2016  . Varicose veins of both legs with edema 06/28/2016  . Adverse effects of medication 03/10/2016  . Bipolar disorder ([place]) 03/10/2016  . Rheumatoid arthritis ([place]) 03/10/2016  . Migraine 03/10/2016  . IBS (irritable bowel syndrome) 03/10/2016  . Anxiety 03/10/2016  . Asthma in adult 03/10/2016  . GERD (gastroesophageal reflux disease) 03/10/2016  . Epigastric pain 06/04/2015  . Pharyngoesophageal dysphagia 02/10/2015  . S/P right knee arthroscopy 04/16/2014  . Chondromalacia of knee 02/26/2014  . Knee pain 02/20/2014  . Bilateral knee pain 02/05/2014  . Vitamin D deficiency 12/14/2013  . S/P gastric bypass 12/14/2013    Orientation RESPIRATION BLADDER Height & Weight     Self, Time, Situation, Place  Normal Continent, Indwelling catheter Weight: 102.8 kg (226 lb 10.1 oz) Height:  5\' 7"  (170.2 cm)  BEHAVIORAL SYMPTOMS/MOOD NEUROLOGICAL BOWEL NUTRITION STATUS  (NA)   Continent Diet(Please see DC Summary)  AMBULATORY STATUS COMMUNICATION OF NEEDS Skin   Extensive Assist Verbally Normal                       Personal Care Assistance Level of Assistance  Bathing, Feeding, Dressing Bathing Assistance: Maximum assistance Feeding assistance: Limited assistance Dressing Assistance: Limited assistance     Functional Limitations Info  Sight, Hearing, Speech Sight Info: Adequate Hearing Info: Adequate Speech Info: Adequate(Sometimes slurred)    SPECIAL CARE FACTORS FREQUENCY  PT (By licensed PT), OT (By licensed OT)     PT Frequency: 5x/week OT Frequency: 3x/week            Contractures      Additional Factors Info  Code Status, Allergies, Psychotropic Code Status  Info: Full Allergies Info: Sulfasalazine, Lamictal Lamotrigine, Penicillins, Sulfa Antibiotics, Morphine And Related Psychotropic Info: Seroquel         Current Medications (06/07/2018):  This is the current hospital active medication list Current Facility-Administered Medications   Medication Dose Route Frequency Provider Last Rate Last Dose  . albuterol (PROVENTIL) (2.5 MG/3ML) 0.083% nebulizer solution 2.5 mg  2.5 mg Nebulization Q4H PRN Fuller Plan A, MD      . ALPRAZolam Duanne Moron) tablet 1 mg  1 mg Oral QID PRN Fuller Plan A, MD   1 mg at 06/05/18 2113  . aztreonam (AZACTAM) 2 g in sodium chloride 0.9 % 100 mL IVPB  2 g Intravenous Q8H Norval Morton, MD   Stopped at 06/07/18 1457  . enoxaparin (LOVENOX) injection 40 mg  40 mg Subcutaneous QHS Fuller Plan A, MD   40 mg at 06/06/18 2143  . feeding supplement (BOOST / RESOURCE BREEZE) liquid 1 Container  1 Container Oral TID BM Arrien, Jimmy Picket, MD   1 Container at 06/07/18 1424  . furosemide (LASIX) tablet 40 mg  40 mg Oral Daily Shelly Coss, MD   40 mg at 06/07/18 1257  . levothyroxine (SYNTHROID, LEVOTHROID) tablet 50 mcg  50 mcg Oral QAC breakfast Fuller Plan A, MD   50 mcg at 06/07/18 1007  . ondansetron (ZOFRAN) tablet 4 mg  4 mg Oral Q6H PRN Fuller Plan A, MD       Or  . ondansetron (ZOFRAN) injection 4 mg  4 mg Intravenous Q6H PRN Fuller Plan A, MD   4 mg at 06/06/18 1540  . pantoprazole (PROTONIX) EC tablet 40 mg  40 mg Oral BID Fuller Plan A, MD   40 mg at 06/07/18 1007  . QUEtiapine (SEROQUEL) tablet 600 mg  600 mg Oral QHS Smith, Rondell A, MD   600 mg at 06/06/18 2144  . topiramate (TOPAMAX) tablet 50 mg  50 mg Oral BID Fuller Plan A, MD   50 mg at 06/07/18 1007     Discharge Medications: Please see discharge summary for a list of discharge medications.  Relevant Imaging Results:  Relevant Lab Results:   Additional Information SSN: [place], [place]

## 2018-06-07 NOTE — Progress Notes (Signed)
PROGRESS NOTE    Jordan Russell  VFI:[id number] DOB: 08-11-1971 DOA: 06/04/2018 PCP: Josetta Huddle, MD   Brief Narrative: Patient is a 47 year old female who presented with altered mental status. She has significant past medical from her arthritis, bipolar disease, hyperparathyroidism, gastric varices status post gastric bypass who presents with acute worsening altered mentation. Positive for edema of the lower extremities for the last 6 months, significant decreased p.o. intake for the last 3 years. Unable to ambulate due to severe edema of lower extremities. Reported abdominal pain and jaundice for the last few days prior to hospitalization.Physical examination blood pressure 100/45, 97/54pulse rate 104, respiratory rate 21, temperature 97.7, oxygen saturation 78%. She had decreased breath sounds bilaterally, heart S1-S2 present and rhythmic, abdomen protuberant, +4+ pitting bilaterally. No skin rashes, positive jaundice. Sodium 132, potassium 4.2, chloride 98, bicarb 19, glucose 59, BUN 18, creatinine 1.25, anion gap 15, alkaline phosphatase 540, albumin 1.9, lipase 20, AST 91, ALT 91, ammonia 28, total bilirubin is 14.3, white count 13.4, hemoglobin 12.8, hematocrit 38.2 platelets 158.Urinalysis 0-5 white cells. Positive choluria. Drug screen positive for benzodiazepines. Alcohol level less than 10. Chest x-ray hypoinflated, no significant infiltrates. EKG normal sinus rhythm, normal intervals, axis. CT of theabdomen with patchy areas of colonic wall thickening, ascending colon and cecum consistent with colitis. Head CT negative for acute changes. Patientwas admitted to the hospital with the working diagnosis of possible sepsis.      Assessment & Plan:   Principal Problem:   Severe sepsis ([place]) Active Problems:   Bipolar disorder ([place])   Anxiety   Hypoglycemia   S/P gastric bypass   Acute encephalopathy   Hypoxia   Hyperbilirubinemia   Fatty infiltration of  liver   AKI (acute kidney injury) ([place])   Protein-calorie malnutrition, severe  1. Sepsis due to gram negative bacteremia/ E coli (present on admission). Tolerating well antibiotic therapy with aztreonam, patient has remained afebrile.Possible colitis source of bacteremia. Sensitivities shows  E. coli is sensitive to multiple organisms. we will continue current IV therapy until she is discharged.  Antibiotics will be changed to ciprofloxacin on discharge.  2. Acute liver failure with direct Hyperbilirubinemia. Liver biopsy was normal.  Liver failure thought to be medication induced. Plaquinil has been discontinued.  AST at 70. ALT at 59. Bilirubins continue to trend up, now up to 15. INR is 1.3 Patient with fatigue but not encephalopathic.  Gastroenterology following.  F/U  ANA, anti-smooth muscle antibodies, antimitochondrial antibodies.  4. AKI: Resolved.  But looks like she is intravascularly depleted.   5. RA. No active pain, will hold on plaquenil due to potential liver toxicity.   6. Sever malnutrition. Will continue nutritional supplements with Boost Breeeze per nutrition recommendations.   7.  Bilateral severe lower extremity edema, anasarca: Most likely secondary to severe hypoalbuminemia, liver failure.Will put her on lasix 40 mg daily for now.  8.Weakness/debility: Physical therapy evaluated the patient and recommended skilled nursing facility on discharge.  Social worker consulted    DVT prophylaxis: ScD Code Status: Full Family Communication: Husband present at the bedside  Disposition Plan: Skilled nursing facility.  Patient is still not stable to be discharged.   Consultants: GI  Procedures:None  Antimicrobials:None  Subjective: Patient seen and examined the bedside this morning.  Continues to remain weak and very deconditioned.  Complains of some abdominal pain.  No nausea vomiting or fever.  She has severe bilateral lower extremity  edema.  Objective: Vitals:   06/07/18 0451 06/07/18 0551 06/07/18  0629 06/07/18 0743  BP: (!) 106/55 109/64    Pulse:  (!) 105    Resp: (!) 21 (!) 22    Temp: 98 F (36.7 C) 97.9 F (36.6 C) 97.7 F (36.5 C) 97.7 F (36.5 C)  TempSrc: Oral Oral  Oral  SpO2:  94%    Weight:   102.8 kg (226 lb 10.1 oz)   Height:        Intake/Output Summary (Last 24 hours) at 06/07/2018 1119 Last data filed at 06/07/2018 0900 Gross per 24 hour  Intake 320 ml  Output 1100 ml  Net -780 ml   Filed Weights   06/05/18 0315 06/06/18 0338 06/07/18 0629  Weight: 104.3 kg (229 lb 15 oz) 107.7 kg (237 lb 7 oz) 102.8 kg (226 lb 10.1 oz)    Examination:  General exam:Weak ,Not in distress,cachetic HEENT:PERRL,Oral mucosa moist, Ear/Nose normal on gross exam Respiratory system: Bilateral equal air entry, normal vesicular breath sounds, no wheezes or crackles  Cardiovascular system: S1 & S2 heard, RRR. No JVD, murmurs, rubs, gallops or clicks. No pedal edema. Gastrointestinal system: Abdomen is distended, soft and nontender. No organomegaly or masses felt. Normal bowel sounds heard. Central nervous system: Alert and oriented. No focal neurological deficits. Extremities:Anasarca, no clubbing ,no cyanosis, distal peripheral pulses palpable. Skin: No rashes, lesions or ulcers,no pallor.Icteric Psychiatry: Judgement and insight appear normal. Mood & affect appropriate.     Data Reviewed: I have personally reviewed following labs and imaging studies  CBC: Recent Labs  Lab 06/04/18 1618 06/04/18 1646 06/06/18 0650 06/07/18 0424  WBC 13.4*  --  11.8* 16.2*  NEUTROABS 11.9*  --  9.9* 13.5*  HGB 12.8 12.2 9.5* 10.0*  HCT 38.2 36.0 28.6* 29.7*  MCV 93.9  --  93.8 93.1  PLT 158  158  --  PLATELET CLUMPS NOTED ON SMEAR, COUNT APPEARS DECREASED 89*   Basic Metabolic Panel: Recent Labs  Lab 06/04/18 1618 06/04/18 1646 06/05/18 1219 06/06/18 0650 06/07/18 0424  NA 132* 133* 133* 134* 134*  K 4.2  4.2 4.3 4.1 3.4*  CL 98 101 103 103 103  CO2 19*  --  19* 20* 20*  GLUCOSE 59* 50* 75 81 81  BUN 18 18 19 18 13   CREATININE 1.25* 1.50* 1.46* 1.17* 0.72  CALCIUM 8.0*  --  7.4* 7.7* 7.6*   GFR: Estimated Creatinine Clearance: 106 mL/min (by C-G formula based on SCr of 0.72 mg/dL). Liver Function Tests: Recent Labs  Lab 06/04/18 1618 06/05/18 1219 06/06/18 1438 06/07/18 0424  AST 91* 61* 70* 87*  ALT 91* 63* 59* 63*  ALKPHOS 540* 358* 399* 490*  BILITOT 14.3* 12.2* 14.0* 15.3*  PROT 4.3* 3.5* 3.6* 3.8*  ALBUMIN 1.9* 1.7* 1.6* 1.6*   Recent Labs  Lab 06/04/18 1618  LIPASE 20   Recent Labs  Lab 06/04/18 1618  AMMONIA 28   Coagulation Profile: Recent Labs  Lab 06/04/18 1618 06/07/18 0424  INR 1.30 1.31   Cardiac Enzymes: Recent Labs  Lab 06/04/18 1618 06/04/18 2258  TROPONINI 0.04* 0.04*   BNP (last 3 results) No results for input(s): PROBNP in the last 8760 hours. HbA1C: No results for input(s): HGBA1C in the last 72 hours. CBG: Recent Labs  Lab 06/06/18 0530 06/06/18 0744 06/06/18 1205 06/06/18 1650 06/06/18 2340  GLUCAP 80 121* 90 108* 88   Lipid Profile: No results for input(s): CHOL, HDL, LDLCALC, TRIG, CHOLHDL, LDLDIRECT in the last 72 hours. Thyroid Function Tests: Recent Labs  06/05/18 0714  TSH 7.063*   Anemia Panel: No results for input(s): VITAMINB12, FOLATE, FERRITIN, TIBC, IRON, RETICCTPCT in the last 72 hours. Sepsis Labs: Recent Labs  Lab 06/04/18 1631 06/04/18 1833 06/04/18 2258  PROCALCITON  --   --  23.93  LATICACIDVEN 6.17* 6.23* 5.1*    Recent Results (from the past 240 hour(s))  Blood Culture (routine x 2)     Status: Abnormal   Collection Time: 06/04/18  6:36 PM  Result Value Ref Range Status   Specimen Description   Final    BLOOD BLOOD RIGHT FOREARM Performed at [facility] [address], [place], [place] [address]    Special Requests   Final    BOTTLES DRAWN AEROBIC ONLY Blood  Culture results may not be optimal due to an inadequate volume of blood received in culture bottles Performed at [facility] [address] [place], [place] [address]    Culture  Setup Time   Final    GRAM NEGATIVE RODS AEROBIC BOTTLE ONLY CRITICAL VALUE NOTED.  VALUE IS CONSISTENT WITH PREVIOUSLY REPORTED AND CALLED VALUE.    Culture (A)  Final    ESCHERICHIA COLI SUSCEPTIBILITIES PERFORMED ON PREVIOUS CULTURE WITHIN THE LAST 5 DAYS. Performed at [facility], [place] [address], [place], [place] [address]    Report Status 06/07/2018 FINAL  Final  Blood Culture (routine x 2)     Status: Abnormal   Collection Time: 06/04/18  6:36 PM  Result Value Ref Range Status   Specimen Description   Final    BLOOD BLOOD RIGHT HAND Performed at [facility] [address] [place], [place] [address]    Special Requests   Final    BOTTLES DRAWN AEROBIC ONLY Blood Culture results may not be optimal due to an inadequate volume of blood received in culture bottles Performed at [facility] [address] [place], [place] [address]    Culture  Setup Time   Final    GRAM NEGATIVE RODS AEROBIC BOTTLE ONLY CRITICAL RESULT CALLED TO, READ BACK BY AND VERIFIED WITH: Jene Every PharmD 12:25 06/05/18 ([email or web address]) Performed at [facility], [place] [address] [place], [place] [address]    Culture ESCHERICHIA COLI (A)  Final   Report Status 06/07/2018 FINAL  Final   Organism ID, Bacteria ESCHERICHIA COLI  Final      Susceptibility   Escherichia coli - MIC*    AMPICILLIN >=32 RESISTANT Resistant     CEFAZOLIN <=4 SENSITIVE Sensitive     CEFEPIME <=1 SENSITIVE Sensitive     CEFTAZIDIME <=1 SENSITIVE Sensitive     CEFTRIAXONE <=1 SENSITIVE Sensitive     CIPROFLOXACIN <=0.25 SENSITIVE Sensitive     GENTAMICIN <=1 SENSITIVE Sensitive     IMIPENEM <=0.25 SENSITIVE Sensitive     TRIMETH/SULFA >=320 RESISTANT Resistant     AMPICILLIN/SULBACTAM 16  INTERMEDIATE Intermediate     PIP/TAZO <=4 SENSITIVE Sensitive     Extended ESBL NEGATIVE Sensitive     * ESCHERICHIA COLI  Blood Culture ID Panel (Reflexed)     Status: Abnormal   Collection Time: 06/04/18  6:36 PM  Result Value Ref Range Status   Enterococcus species NOT DETECTED NOT DETECTED Final   Listeria monocytogenes NOT DETECTED NOT DETECTED Final   Staphylococcus species NOT DETECTED NOT DETECTED Final   Staphylococcus aureus NOT DETECTED NOT DETECTED Final   Streptococcus species NOT DETECTED NOT DETECTED Final   Streptococcus agalactiae NOT  DETECTED NOT DETECTED Final   Streptococcus pneumoniae NOT DETECTED NOT DETECTED Final   Streptococcus pyogenes NOT DETECTED NOT DETECTED Final   Acinetobacter baumannii NOT DETECTED NOT DETECTED Final   Enterobacteriaceae species DETECTED (A) NOT DETECTED Final    Comment: Enterobacteriaceae represent a large family of gram-negative bacteria, not a single organism. CRITICAL RESULT CALLED TO, READ BACK BY AND VERIFIED WITH: Jene Every PharmD 12:25 06/05/18 ([email or web address])    Enterobacter cloacae complex NOT DETECTED NOT DETECTED Final   Escherichia coli DETECTED (A) NOT DETECTED Final    Comment: CRITICAL RESULT CALLED TO, READ BACK BY AND VERIFIED WITH: Jene Every PharmD 12:25 06/05/18 ([email or web address])    Klebsiella oxytoca NOT DETECTED NOT DETECTED Final   Klebsiella pneumoniae NOT DETECTED NOT DETECTED Final   Proteus species NOT DETECTED NOT DETECTED Final   Serratia marcescens NOT DETECTED NOT DETECTED Final   Carbapenem resistance NOT DETECTED NOT DETECTED Final   Haemophilus influenzae NOT DETECTED NOT DETECTED Final   Neisseria meningitidis NOT DETECTED NOT DETECTED Final   Pseudomonas aeruginosa NOT DETECTED NOT DETECTED Final   Candida albicans NOT DETECTED NOT DETECTED Final   Candida glabrata NOT DETECTED NOT DETECTED Final   Candida krusei NOT DETECTED NOT DETECTED Final   Candida parapsilosis NOT DETECTED NOT DETECTED Final   Candida  tropicalis NOT DETECTED NOT DETECTED Final    Comment: Performed at [facility], [place] [address], [place], [place] [address]  MRSA PCR Screening     Status: None   Collection Time: 06/05/18  3:34 AM  Result Value Ref Range Status   MRSA by PCR NEGATIVE NEGATIVE Final    Comment:        The GeneXpert MRSA Assay (FDA approved for NASAL specimens only), is one component of a comprehensive MRSA colonization surveillance program. It is not intended to diagnose MRSA infection nor to guide or monitor treatment for MRSA infections. Performed at [facility], [place] [address], [place], [place] [address]          Radiology Studies: No results found.      Scheduled Meds: . enoxaparin (LOVENOX) injection  40 mg Subcutaneous QHS  . feeding supplement  1 Container Oral TID BM  . levothyroxine  50 mcg Oral QAC breakfast  . pantoprazole  40 mg Oral BID  . QUEtiapine  600 mg Oral QHS  . topiramate  50 mg Oral BID   Continuous Infusions: . aztreonam 2 g (06/07/18 0511)     LOS: 3 days    Time spent:35 mins. More than 50% of that time was spent in counseling and/or coordination of care.      Shelly Coss, MD [facility] Pager [phone]  If 7PM-7AM, please contact night-coverage [email or web address] Password [email or web address] 06/07/2018, 11:19 AM

## 2018-06-07 NOTE — Progress Notes (Deleted)
Cardiology Office Note   Date:  06/07/2018   ID:  Jordan Russell, DOB 1971/01/24, MRN [id number]  PCP:  Josetta Huddle, MD  Cardiologist:   No primary care provider on file. Referring:  ***  No chief complaint on file.     History of Present Illness: Jordan Russell is a 47 y.o. female who is referred by *** for evaluation of lower extremity edema.  ***    Past Medical History:  Diagnosis Date  . Asthma   . Bipolar 1 disorder ([place])   . Fibromyalgia   . GAD (generalized anxiety disorder)   . Gastroparesis   . GERD (gastroesophageal reflux disease)   . Hiatal hernia   . History of kidney stones   . History of primary hyperparathyroidism    s/p  bilateral inferior parathyroidectomy 08-03-2010  . IBS (irritable bowel syndrome)   . Left ureteral stone   . Migraine   . PONV (postoperative nausea and vomiting)    and claustrophobic with mask  . RA (rheumatoid arthritis) ([place])    dr Gavin Pound-- rheumtologist  . Self mutilating behavior   . Thyroid disease   . Urgency of urination   . Wears glasses     Past Surgical History:  Procedure Laterality Date  . ABDOMINAL HYSTERECTOMY    . BALLOON DILATION N/A 10/01/2014   Procedure: BALLOON DILATION;  Surgeon: Garlan Fair, MD;  Location: [facility];  Service: Endoscopy;  Laterality: N/A;  . CYSTO/  BILATERAL RETROGRADE PYELOGRAM  11/20/2000  . CYSTO/  RIGHT RETROGRADE PYELOGRAM/  RIGHT URETEROSCOPY/  STENT PLACEMENT  12/16/2006  . CYSTOSCOPY/URETEROSCOPY/HOLMIUM LASER/STENT PLACEMENT Left 11/09/2016   Procedure: CYSTOSCOPY/URETEROSCOPY/HOLMIUM LASER/STENT PLACEMENT;  Surgeon: Nickie Retort, MD;  Location: [facility];  Service: Urology;  Laterality: Left;  90 MINS  [phone]   . ESOPHAGEAL MANOMETRY N/A 12/23/2014   Procedure: ESOPHAGEAL MANOMETRY (EM);  Surgeon: Garlan Fair, MD;  Location: [facility];  Service: Endoscopy;  Laterality: N/A;  . ESOPHAGOGASTRODUODENOSCOPY  last one  03-28-2015  . ESOPHAGOGASTRODUODENOSCOPY (EGD) WITH PROPOFOL N/A 10/01/2014   Procedure: ESOPHAGOGASTRODUODENOSCOPY (EGD) WITH PROPOFOL;  Surgeon: Garlan Fair, MD;  Location: [facility];  Service: Endoscopy;  Laterality: N/A;  . EXTRACORPOREAL SHOCK WAVE LITHOTRIPSY  multiple times since age 59  . HOLMIUM LASER APPLICATION Left 0/06/6760   Procedure: HOLMIUM LASER APPLICATION;  Surgeon: Nickie Retort, MD;  Location: [facility];  Service: Urology;  Laterality: Left;  . KNEE ARTHROSCOPY Right 03-04-2014  [facility]   w/ Arthrotomy  . LAPAROSCOPIC ASSISTED VAGINAL HYSTERECTOMY  12/28/2005  . LAPAROSCOPIC CHOLECYSTECTOMY  01/17/2004  . LAPAROSCOPY LEFT OVARIAN CYSTECTOMY/  BILATERAL TUBAL LIGATION  02/26/2003  . LEFT URETEROSCOPIC STONE EXTRACTION /  STENT PLACEMENT  07/09/2002  . NECK EXPLORATION/  BILATERAL INFERIOR PARATHYROIDECTOMY  08/03/2010  . RIGHT URETERAL DILATION/  URETEROSCOPIC STONE EXTRACTION  06/12/2010  . ROUX-EN-Y GASTRIC BYPASS  2001  . SOLYX TRANSURETHRAL SLING/  POSTERIOR PELVIC FLOOR SACROSPINOUS REPAIR  02/21/2009   and Cysto/  Bilateral ureteral stent placement  . TONSILLECTOMY AND ADENOIDECTOMY    . WRIST GANGLION EXCISION Right 01/28/2000     No current facility-administered medications for this visit.    No current outpatient medications on file.   Facility-Administered Medications Ordered in Other Visits  Medication Dose Route Frequency Provider Last Rate Last Dose  . albuterol (PROVENTIL) (2.5 MG/3ML) 0.083% nebulizer solution 2.5 mg  2.5 mg Nebulization Q4H PRN Norval Morton, MD      .  ALPRAZolam Duanne Moron) tablet 1 mg  1 mg Oral QID PRN Fuller Plan A, MD   1 mg at 06/05/18 2113  . ciprofloxacin (CIPRO) tablet 500 mg  500 mg Oral BID Shelly Coss, MD   500 mg at 06/07/18 2134  . enoxaparin (LOVENOX) injection 40 mg  40 mg Subcutaneous QHS Tamala Julian, Rondell A, MD   40 mg at 06/07/18 2135  . feeding supplement (BOOST / RESOURCE BREEZE)  liquid 1 Container  1 Container Oral TID BM Arrien, Jimmy Picket, MD   1 Container at 06/07/18 2013  . furosemide (LASIX) tablet 40 mg  40 mg Oral Daily Shelly Coss, MD   40 mg at 06/07/18 1257  . levothyroxine (SYNTHROID, LEVOTHROID) tablet 50 mcg  50 mcg Oral QAC breakfast Fuller Plan A, MD   50 mcg at 06/07/18 1007  . ondansetron (ZOFRAN) tablet 4 mg  4 mg Oral Q6H PRN Fuller Plan A, MD       Or  . ondansetron (ZOFRAN) injection 4 mg  4 mg Intravenous Q6H PRN Fuller Plan A, MD   4 mg at 06/07/18 2135  . pantoprazole (PROTONIX) EC tablet 40 mg  40 mg Oral BID Fuller Plan A, MD   40 mg at 06/07/18 2134  . QUEtiapine (SEROQUEL) tablet 600 mg  600 mg Oral QHS Smith, Rondell A, MD   600 mg at 06/07/18 2135  . topiramate (TOPAMAX) tablet 50 mg  50 mg Oral BID Fuller Plan A, MD   50 mg at 06/07/18 2134    Allergies:   Sulfasalazine; Lamictal [lamotrigine]; Penicillins; Sulfa antibiotics; and Morphine and related    Social History:  The patient  reports that she has been smoking cigarettes.  She has a 5.00 pack-year smoking history. She has never used smokeless tobacco. She reports that she drinks alcohol. She reports that she does not use drugs.   Family History:  The patient's ***family history includes Anxiety disorder in her brother; Bipolar disorder in her son; Breast cancer in her maternal aunt, maternal grandmother, mother, and sister; Depression in her brother.    ROS:  Please see the history of present illness.   Otherwise, review of systems are positive for {NONE DEFAULTED:18576::"none"}.   All other systems are reviewed and negative.    PHYSICAL EXAM: VS:  There were no vitals taken for this visit. , BMI There is no height or weight on file to calculate BMI. GENERAL:  Well appearing HEENT:  Pupils equal round and reactive, fundi not visualized, oral mucosa unremarkable NECK:  No jugular venous distention, waveform within normal limits, carotid upstroke brisk and  symmetric, no bruits, no thyromegaly LYMPHATICS:  No cervical, inguinal adenopathy LUNGS:  Clear to auscultation bilaterally BACK:  No CVA tenderness CHEST:  Unremarkable HEART:  PMI not displaced or sustained,S1 and S2 within normal limits, no S3, no S4, no clicks, no rubs, *** murmurs ABD:  Flat, positive bowel sounds normal in frequency in pitch, no bruits, no rebound, no guarding, no midline pulsatile mass, no hepatomegaly, no splenomegaly EXT:  2 plus pulses throughout, no edema, no cyanosis no clubbing SKIN:  No rashes no nodules NEURO:  Cranial nerves II through XII grossly intact, motor grossly intact throughout PSYCH:  Cognitively intact, oriented to person place and time    EKG:  EKG {ACTION; IS/IS QHU:[id number]} ordered today. The ekg ordered today demonstrates ***   Recent Labs: 06/05/2018: TSH 7.063 06/07/2018: ALT 63; BUN 13; Creatinine, Ser 0.72; Hemoglobin 10.0; Platelets 89; Potassium 3.4; Sodium  134    Lipid Panel No results found for: CHOL, TRIG, HDL, CHOLHDL, VLDL, LDLCALC, LDLDIRECT    Wt Readings from Last 3 Encounters:  06/07/18 226 lb 10.1 oz (102.8 kg)  05/31/18 209 lb (94.8 kg)  05/22/18 212 lb (96.2 kg)      Other studies Reviewed: Additional studies/ records that were reviewed today include: ***. Review of the above records demonstrates:  Please see elsewhere in the note.  ***   ASSESSMENT AND PLAN:  EDEMA:  ***   Current medicines are reviewed at length with the patient today.  The patient {ACTIONS; HAS/DOES NOT HAVE:19233} concerns regarding medicines.  The following changes have been made:  {PLAN; NO CHANGE:13088:s}  Labs/ tests ordered today include: *** No orders of the defined types were placed in this encounter.    Disposition:   FU with ***    Signed, Minus Breeding, MD  06/07/2018 9:56 PM    [facility]

## 2018-06-08 ENCOUNTER — Inpatient Hospital Stay (HOSPITAL_COMMUNITY): Payer: 59

## 2018-06-08 ENCOUNTER — Ambulatory Visit: Payer: 59 | Admitting: Cardiology

## 2018-06-08 DIAGNOSIS — R4182 Altered mental status, unspecified: Secondary | ICD-10-CM | POA: Diagnosis not present

## 2018-06-08 DIAGNOSIS — K7290 Hepatic failure, unspecified without coma: Secondary | ICD-10-CM

## 2018-06-08 DIAGNOSIS — R410 Disorientation, unspecified: Secondary | ICD-10-CM | POA: Diagnosis not present

## 2018-06-08 DIAGNOSIS — K529 Noninfective gastroenteritis and colitis, unspecified: Secondary | ICD-10-CM | POA: Diagnosis not present

## 2018-06-08 DIAGNOSIS — E7220 Disorder of urea cycle metabolism, unspecified: Secondary | ICD-10-CM

## 2018-06-08 DIAGNOSIS — A4151 Sepsis due to Escherichia coli [E. coli]: Principal | ICD-10-CM

## 2018-06-08 DIAGNOSIS — R945 Abnormal results of liver function studies: Secondary | ICD-10-CM | POA: Diagnosis not present

## 2018-06-08 DIAGNOSIS — K719 Toxic liver disease, unspecified: Secondary | ICD-10-CM

## 2018-06-08 DIAGNOSIS — J189 Pneumonia, unspecified organism: Secondary | ICD-10-CM

## 2018-06-08 DIAGNOSIS — R7989 Other specified abnormal findings of blood chemistry: Secondary | ICD-10-CM

## 2018-06-08 DIAGNOSIS — R601 Generalized edema: Secondary | ICD-10-CM

## 2018-06-08 DIAGNOSIS — K7110 Toxic liver disease with hepatic necrosis, without coma: Secondary | ICD-10-CM

## 2018-06-08 DIAGNOSIS — G9340 Encephalopathy, unspecified: Secondary | ICD-10-CM | POA: Diagnosis not present

## 2018-06-08 HISTORY — DX: Disorder of urea cycle metabolism, unspecified: E72.20

## 2018-06-08 HISTORY — DX: Other specified abnormal findings of blood chemistry: R79.89

## 2018-06-08 HISTORY — DX: Toxic liver disease, unspecified: K71.9

## 2018-06-08 HISTORY — DX: Generalized edema: R60.1

## 2018-06-08 LAB — COMPREHENSIVE METABOLIC PANEL
ALK PHOS: 554 U/L — AB (ref 38–126)
ALT: 65 U/L — AB (ref 0–44)
AST: 106 U/L — AB (ref 15–41)
Albumin: 1.4 g/dL — ABNORMAL LOW (ref 3.5–5.0)
Anion gap: 10 (ref 5–15)
BUN: 11 mg/dL (ref 6–20)
CALCIUM: 7.6 mg/dL — AB (ref 8.9–10.3)
CHLORIDE: 104 mmol/L (ref 98–111)
CO2: 22 mmol/L (ref 22–32)
CREATININE: 0.62 mg/dL (ref 0.44–1.00)
Glucose, Bld: 89 mg/dL (ref 70–99)
Potassium: 3.1 mmol/L — ABNORMAL LOW (ref 3.5–5.1)
Sodium: 136 mmol/L (ref 135–145)
Total Bilirubin: 15.9 mg/dL — ABNORMAL HIGH (ref 0.3–1.2)
Total Protein: 3.5 g/dL — ABNORMAL LOW (ref 6.5–8.1)

## 2018-06-08 LAB — CBC WITH DIFFERENTIAL/PLATELET
BASOS PCT: 1 %
Basophils Absolute: 0.2 10*3/uL — ABNORMAL HIGH (ref 0.0–0.1)
EOS ABS: 0.2 10*3/uL (ref 0.0–0.7)
Eosinophils Relative: 1 %
HEMATOCRIT: 28.5 % — AB (ref 36.0–46.0)
Hemoglobin: 9.5 g/dL — ABNORMAL LOW (ref 12.0–15.0)
LYMPHS ABS: 2.6 10*3/uL (ref 0.7–4.0)
Lymphocytes Relative: 14 %
MCH: 30.6 pg (ref 26.0–34.0)
MCHC: 33.3 g/dL (ref 30.0–36.0)
MCV: 91.9 fL (ref 78.0–100.0)
MONO ABS: 1.3 10*3/uL — AB (ref 0.1–1.0)
Monocytes Relative: 7 %
NEUTROS PCT: 77 %
Neutro Abs: 14 10*3/uL — ABNORMAL HIGH (ref 1.7–7.7)
PLATELETS: 80 10*3/uL — AB (ref 150–400)
RBC: 3.1 MIL/uL — ABNORMAL LOW (ref 3.87–5.11)
RDW: 16.8 % — AB (ref 11.5–15.5)
WBC: 18.3 10*3/uL — ABNORMAL HIGH (ref 4.0–10.5)

## 2018-06-08 LAB — GLUCOSE, CAPILLARY
GLUCOSE-CAPILLARY: 101 mg/dL — AB (ref 70–99)
GLUCOSE-CAPILLARY: 121 mg/dL — AB (ref 70–99)
Glucose-Capillary: 88 mg/dL (ref 70–99)
Glucose-Capillary: 110 mg/dL — ABNORMAL HIGH (ref 70–99)

## 2018-06-08 LAB — PROTIME-INR
INR: 1.25
Prothrombin Time: 15.6 seconds — ABNORMAL HIGH (ref 11.4–15.2)

## 2018-06-08 LAB — AMMONIA: Ammonia: 177 umol/L — ABNORMAL HIGH (ref 9–35)

## 2018-06-08 MED ORDER — DEXTROSE 5 % IV SOLN
INTRAVENOUS | Status: DC
  Administered 2018-06-08: 16:00:00 via INTRAVENOUS

## 2018-06-08 MED ORDER — FAMOTIDINE IN NACL 20-0.9 MG/50ML-% IV SOLN
20.0000 mg | Freq: Two times a day (BID) | INTRAVENOUS | Status: DC
  Administered 2018-06-08 – 2018-06-09 (×3): 20 mg via INTRAVENOUS
  Filled 2018-06-08 (×3): qty 50

## 2018-06-08 MED ORDER — POTASSIUM CHLORIDE CRYS ER 20 MEQ PO TBCR: 40.0000 meq | EXTENDED_RELEASE_TABLET | Freq: Two times a day (BID) | ORAL | Status: DC

## 2018-06-08 MED ORDER — LACTULOSE 10 GM/15ML PO SOLN: 30.0000 g | Freq: Four times a day (QID) | ORAL | Status: DC

## 2018-06-08 MED ORDER — POTASSIUM CHLORIDE 10 MEQ/100ML IV SOLN: 10.0000 meq | INTRAVENOUS | Status: DC

## 2018-06-08 MED ORDER — POTASSIUM CHLORIDE 10 MEQ/100ML IV SOLN
10.0000 meq | INTRAVENOUS | Status: DC
  Administered 2018-06-08 (×2): 10 meq via INTRAVENOUS
  Filled 2018-06-08 (×2): qty 100

## 2018-06-08 MED ORDER — IOPAMIDOL (ISOVUE-300) INJECTION 61%
INTRAVENOUS | Status: AC
  Filled 2018-06-08: qty 50

## 2018-06-08 MED ORDER — LACTULOSE ENEMA
300.0000 mL | Freq: Two times a day (BID) | ORAL | Status: DC
  Filled 2018-06-08: qty 300

## 2018-06-08 MED ORDER — FUROSEMIDE 10 MG/ML IJ SOLN
40.0000 mg | Freq: Every day | INTRAMUSCULAR | Status: DC
  Administered 2018-06-08: 40 mg via INTRAVENOUS
  Filled 2018-06-08 (×2): qty 4

## 2018-06-08 MED ORDER — CIPROFLOXACIN IN D5W 400 MG/200ML IV SOLN
400.0000 mg | Freq: Two times a day (BID) | INTRAVENOUS | Status: DC
  Administered 2018-06-08 (×2): 400 mg via INTRAVENOUS
  Filled 2018-06-08 (×3): qty 200

## 2018-06-08 MED ORDER — LEVOTHYROXINE SODIUM 100 MCG IV SOLR
25.0000 ug | Freq: Every day | INTRAVENOUS | Status: DC
  Administered 2018-06-08 – 2018-06-09 (×2): 25 ug via INTRAVENOUS
  Filled 2018-06-08 (×2): qty 5

## 2018-06-08 MED ORDER — POTASSIUM CHLORIDE CRYS ER 20 MEQ PO TBCR: 40.0000 meq | EXTENDED_RELEASE_TABLET | Freq: Every day | ORAL | Status: DC

## 2018-06-08 MED ORDER — POTASSIUM CHLORIDE 10 MEQ/100ML IV SOLN
10.0000 meq | INTRAVENOUS | Status: AC
  Administered 2018-06-08 (×2): 10 meq via INTRAVENOUS
  Filled 2018-06-08 (×2): qty 100

## 2018-06-08 MED ORDER — LACTULOSE ENEMA
300.0000 mL | Freq: Once | ORAL | Status: DC
  Filled 2018-06-08: qty 300

## 2018-06-08 MED ORDER — LACTULOSE 10 GM/15ML PO SOLN
30.0000 g | Freq: Four times a day (QID) | ORAL | Status: DC
  Administered 2018-06-08 – 2018-06-09 (×5): 30 g
  Filled 2018-06-08 (×5): qty 45

## 2018-06-08 MED ORDER — RIFAXIMIN 200 MG PO TABS
200.0000 mg | ORAL_TABLET | Freq: Two times a day (BID) | ORAL | Status: DC
Start: 2018-06-08 — End: 2018-06-09
  Filled 2018-06-08 (×3): qty 1

## 2018-06-08 NOTE — Consult Note (Signed)
Consultation Note Date: 06/08/2018   Patient Name: Jordan Russell  DOB: 04-Dec-1970  MRN: [id number]  Age / Sex: 47 y.o., female  PCP: Josetta Huddle, MD Referring Physician: Shelly Coss, MD  Reason for Consultation: Establishing goals of care and Psychosocial/spiritual support  HPI/Patient Profile: 47 y.o. female  with past medical history of gastric bypass, rheumatoid arthritis, thyroid disease, severe constipation (bowel movement q 2-3 weeks), gastroparesis, elevated liver enzymes, bipolar disorder (with h/o lithium toxicity), hyperparathyroidism, and kidney stones who was admitted on 06/04/2018 with altered mental status.  She was found to be severely septic due to acute colitis.  She was jaundiced with a bilirubin of over 14 and elevated PT/INR indicating acute liver failure.  She also had acute kidney injury.  Further workup indicated severe malnutrition and e-coli bacteremia.  Over the course of several days her ammonia levels have risen.  Her albumin is currently 1.4.  Clinical Assessment and Goals of Care:  I have reviewed medical records including EPIC notes, labs and imaging, received report from the care team, assessed the patient and then met at the bedside along with her husband, brother, and mother  to discuss diagnosis prognosis, GOC,  disposition and options.  When I introduced my self as being with the Palliative Medicine team, Jordan Russell's mother immediately asked why I was there and started to cry.  She felt I was there to provide end of life care.  I received a similar reaction from Jordan Russell father who immediately asked "Does Marielis have a chance?".  I introduced Palliative Medicine as specialized medical care for people living with serious illness. It focuses on providing relief from the symptoms and stress of a serious illness. The goal is to improve quality of life for both the  patient and the family.  We discussed a brief life review of the patient. Jordan Russell has a close knit supportive family that live in close proximity to each other.  She has two sisters and 1 brother.  She has been married to Sebeka for 23 years.  They have 1 son who is 58 years old.  Her family described Jordan Russell when she was healthy as extraverted, sassy, intelligent, funny - an Engineer, civil (consulting).  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 She has a Masters degree in higher education and is an avid reader.  She and her family attend church regularly and are [place].     She underwent gastric bypass about 12 years ago and it seemed her health issues started then.  The family described concerned about being overmedicated (from RA meds, psych meds, etc), gastro intestinal issues - frequent vomiting of food/medication/liquids, severe constipation having a bowel movement once every 2-3 weeks.  The discussed her cognitive decline that seemed to worsen after a lamictal toxicity 18 months ago and then further decline after ECT treatments.  Jordan Russell is no longer able to pay bills, manage finances, she can not drive, she walks with a walker or cane due to leg weakness, and she really struggles  with her memory.    Her brother feels that she was over medicated and that she likely took her medications incorrectly.  Most recently Jordan Russell has become depressed.  She is tired and frustrated with being sick.  She expressed to Jordan Russell - I don't think I can do this anymore.  We discussed her current illness and what it means in the larger context of her on-going co-morbidities.  I expressed my concern that while we will likely be able to remedy the E coli bacteremia and bring down her ammonia level - longer term her over all prognosis is very poor.  Given very poor malnutrition with a poorly functioning GI tract, severe symptoms of hypothyroidism, liver failure, and bipolar disorder with severe depression -  her longer term prognosis is very poor.  I attempted to elicit values and goals of care important to the patient.  Her husband Abe People commented that the two of them had never discussed end of life or life support.  I did confirm that Abe People, her husband is her HCPOA.  The family is supportive of him and agrees that "he has the final word".  The difference between aggressive medical intervention and comfort care was considered in light of the patient's goals of care. At this point the family is confident about pursing aggressive medical interventions.  We discussed code status.  For now she will remain a full code.  I explained that if she arrested a code would likely not be successful, but the family felt strongly that for now she should remain a full code.  When asked at what point they would change code status - Abe People stated "when the doctors feel like she is imminently dying we will change her code status."   Questions and concerns were addressed.  Hard Choices booklet left for review. The family was encouraged to call with questions or concerns.    Primary Decision Maker:  HCPOA Husband Billy.    SUMMARY OF RECOMMENDATIONS    Family needs on-going support.  Anxiety level is high.  They need time to understand what is happening with Jordan Russell.  She is a very complex patient.  They will need regular contact with Palliative Care as I'm very concerned that Jordan Russell's overall prognosis is poor.  Fully support decision to transfer Jordan Russell to a higher level of care (tertiary care center) and expert hepatology consultation.  Patient is receiving lactulose for encephalopathy this should help with bowel clean out as well.  maintaining potassium level of 4.5 - 5.0.  Agree with slow continuous feeding thru cor trak given albumin of 1.4  B12 and MMA levels ordered.  Code Status/Advance Care Planning:  Full code   Additional Recommendations (Limitations, Scope, Preferences):  Full Scope  Treatment   Psycho-social/Spiritual:   Desire for further Chaplaincy support: Yes  Prognosis:  She is at high risk for acute decline and death.  She is acutely ill with Acute liver failure, E coli bacteremia, likely pneumonia in the setting of multiple poorly understood co morbidities including hypothyroidism (not responsive to synthroid), hyperparathyroidism s/p removal of 2 glands, Rheumatoid arthritis on long term enbril and plaquenil, fatty liver disease with long term elevation of alk phos, and poorly functioning GI track (ectatic esophagus, gastroparesis, severe on-going constipation) and severe malnutrition.     Discharge Planning: To Be Determined      Primary Diagnoses: Present on Admission: . Severe sepsis ([place]) . Acute encephalopathy . Fatty infiltration of liver . Hyperbilirubinemia . AKI (acute kidney injury) ([place]) .  Bipolar disorder ([place]) . Anxiety   I have reviewed the medical record, interviewed the patient and family, and examined the patient. The following aspects are pertinent.  Past Medical History:  Diagnosis Date  . Asthma   . Bipolar 1 disorder ([place])   . Fibromyalgia   . GAD (generalized anxiety disorder)   . Gastroparesis   . GERD (gastroesophageal reflux disease)   . Hiatal hernia   . History of kidney stones   . History of primary hyperparathyroidism    s/p  bilateral inferior parathyroidectomy 08-03-2010  . IBS (irritable bowel syndrome)   . Left ureteral stone   . Migraine   . PONV (postoperative nausea and vomiting)    and claustrophobic with mask  . RA (rheumatoid arthritis) ([place])    dr Gavin Pound-- rheumtologist  . Self mutilating behavior   . Thyroid disease   . Urgency of urination   . Wears glasses    Social History   Socioeconomic History  . Marital status: Married    Spouse name: Not on file  . Number of children: Not on file  . Years of education: Not on file  . Highest education level: Not on file  Occupational  History  . Not on file  Social Needs  . Financial resource strain: Not on file  . Food insecurity:    Worry: Not on file    Inability: Not on file  . Transportation needs:    Medical: Not on file    Non-medical: Not on file  Tobacco Use  . Smoking status: Current Every Day Smoker    Packs/day: 0.25    Years: 20.00    Pack years: 5.00    Types: Cigarettes  . Smokeless tobacco: Never Used  Substance and Sexual Activity  . Alcohol use: Yes    Comment: occ  . Drug use: No  . Sexual activity: Not on file  Lifestyle  . Physical activity:    Days per week: Not on file    Minutes per session: Not on file  . Stress: Not on file  Relationships  . Social connections:    Talks on phone: Not on file    Gets together: Not on file    Attends religious service: Not on file    Active member of club or organization: Not on file    Attends meetings of clubs or organizations: Not on file    Relationship status: Not on file  Other Topics Concern  . Not on file  Social History Narrative  . Not on file   Family History  Problem Relation Age of Onset  . Bipolar disorder Son   . Breast cancer Mother   . Breast cancer Sister   . Anxiety disorder Brother   . Depression Brother   . Breast cancer Maternal Aunt   . Breast cancer Maternal Grandmother    Scheduled Meds: . feeding supplement  1 Container Oral TID BM  . furosemide  40 mg Intravenous Daily  . iopamidol      . lactulose  30 g Per Tube Q6H  . lactulose  300 mL Rectal Once  . levothyroxine  25 mcg Intravenous Daily  . rifaximin  200 mg Oral BID   Continuous Infusions: . ciprofloxacin 400 mg (06/08/18 1150)  . dextrose 50 mL/hr at 06/08/18 1545  . famotidine (PEPCID) IV 20 mg (06/08/18 1114)  . potassium chloride     PRN Meds:.albuterol, ondansetron **OR** ondansetron (ZOFRAN) IV Allergies  Allergen Reactions  .  Sulfasalazine Hives  . Lamictal [Lamotrigine] Nausea And Vomiting and Other (See Comments)    Tremors,  diplopia  . Penicillins Hives and Itching    As a child, "breathing, itching problem" Has patient had a PCN reaction causing immediate rash, facial/tongue/throat swelling, SOB or lightheadedness with hypotension: Yes Has patient had a PCN reaction causing severe rash involving mucus membranes or skin necrosis: Yes Has patient had a PCN reaction that required hospitalization No Has patient had a PCN reaction occurring within the last 10 years: No If all of the above answers are "NO", then may proceed with Cephalosporin use.   . Sulfa Antibiotics Hives  . Morphine And Related Itching and Rash   Review of Systems patient is encephalopathic  Physical Exam  Chronically ill appearing female, 3+ Jaundice. CV rrr with sys murmur REsp mildly increased work of breathing today with decreased breath sounds Abdomen soft, obese, nt Ext 3+ pitting edema  Vital Signs: BP (!) 105/58 (BP Location: Right Arm)   Pulse 99   Temp 99.6 F (37.6 C)   Resp (!) 21   Ht _0  (1.702 m)   Wt 104.9 kg   SpO2 92%   BMI 36.22 kg/m  Pain Scale: 0-10   Pain Score: Asleep   SpO2: SpO2: 92 % O2 Device:SpO2: 92 % O2 Flow Rate: .O2 Flow Rate (L/min): 2 L/min  IO: Intake/output summary:   Intake/Output Summary (Last 24 hours) at 06/08/2018 1652 Last data filed at 06/08/2018 0800 Gross per 24 hour  Intake 0 ml  Output 1200 ml  Net -1200 ml    LBM: Last BM Date: (unknown) Baseline Weight: Weight: 94.8 kg Most recent weight: Weight: 104.9 kg     Palliative Assessment/Data: 20%     Time In: 3:30 Time Out: 5:30 Time Total: 2 hours. Greater than 50%  of this time was spent counseling and coordinating care related to the above assessment and plan.  Signed by: Florentina Jenny, PA-C Palliative Medicine Pager: [phone]  Please contact Palliative Medicine Team phone at [phone] for questions and concerns.  For individual provider: See Shea Evans

## 2018-06-08 NOTE — Progress Notes (Signed)
Informed Dr. Tawanna Solo that patient is unable to take oral medications due to her gastroparesis causing her to throw up everytime she eats of drinks. MD to change medications to IV.

## 2018-06-08 NOTE — Progress Notes (Signed)
Informed Dr. Tawanna Solo that patient ammonia level is 177. MD to place orders for lactulose enemas.

## 2018-06-08 NOTE — Progress Notes (Signed)
Informed Dr. Tawanna Solo that 3 nurses attempted to place NGT for lactulose and the attempts were unsuccessful. Per MD place order for [place].

## 2018-06-08 NOTE — Progress Notes (Signed)
.. ..  Name: Jordan Russell MRN: [id number] DOB: June 04, 1971    ADMISSION DATE:  06/04/2018 CONSULTATION DATE:  06/04/18  REFERRING MD :  EDP  CHIEF COMPLAINT:  Difficulty walking, aphasia and auditory hallucinations   BRIEF PATIENT DESCRIPTION: 47 yr old female with PMHx significant for RA, Bipolar, GAD, Hyperparathyroidism s/p parathyroidectomy, nephrolithiasis s/p ureteral stenting, GERD s/p gastric bypass surgery presents to [facility] with complaints of progressive decline in neurological status over 3-6 months and now has developed focal weakness, right sided facial droop and slurred speech. CTH negative. Pt was initially hypotensive but was fluid responsive. PCCM consulted 8/4 due to elevated lactic 6.23 in hemodynamically stable pt.  Patient felt to be stable / no ICU needs at that time.  PCCM called back on 8/8 for re-evaluation of encephalopathy.    SUBJECTIVE: RN reports inability to place NGT due to resistance.  Pt states she does not think she can do it (referring to placement) and asks to "please knock me out".  Husband at bedside.    VITAL SIGNS: Temp:  [98.3 F (36.8 C)-99.6 F (37.6 C)] 99.6 F (37.6 C) (08/07 2300) Pulse Rate:  [98-103] 99 (08/08 0759) Resp:  [19-35] 21 (08/08 0154) BP: (93-121)/(53-85) 105/58 (08/08 0759) SpO2:  [92 %-97 %] 92 % (08/08 0759) Weight:  [104.9 kg] 104.9 kg (08/08 0500)  PHYSICAL EXAMINATION: General:  Adult female lying in bed, jaundice HEENT: MM pink/dre, scleral icterus Neuro: Awakens to voice, speech clear, MAE, oriented to person, place and time CV: s1s2 rrr, no m/r/g PULM: even/non-labored, lungs bilaterally clear anterior ZD:GLOV, non-tender, bsx4 active  Extremities: warm/dry, 3+ BLE pitting edema  Skin: no rashes or lesions.  Jaundice.   Recent Labs  Lab 06/06/18 0650 06/07/18 0424 06/08/18 0536  NA 134* 134* 136  K 4.1 3.4* 3.1*  CL 103 103 104  CO2 20* 20* 22  BUN 18 13 11   CREATININE 1.17* 0.72 0.62  GLUCOSE 81  81 89   Recent Labs  Lab 06/06/18 0650 06/07/18 0424 06/08/18 0536  HGB 9.5* 10.0* 9.5*  HCT 28.6* 29.7* 28.5*  WBC 11.8* 16.2* 18.3*  PLT PLATELET CLUMPS NOTED ON SMEAR, COUNT APPEARS DECREASED 89* 80*   No results found.   SIGNIFICANT EVENTS  8/4  Admit with AMS. Seen by [facility]  8/8  PCCM called back for second evaluation for AMS  STUDIES:  CT Head 8/4 >> negative for acute pathology CT ABD/Pelvis 8/4 >> Patchy areas of colonic wall thickening greatest at ascending colon and cecum consistent with colitis; differential diagnosis includes infection and inflammatory bowel disease, with ischemia considered less likely due to lack of significant vascular disease changes.  Prior gastric bypass surgery with either hiatal hernia or distension of pouch and fluid within the distal esophagus. Marked fatty infiltration of liver. Small amount of free pelvic fluid.  CULTURES: BCx2 8/4 >> E-Coli >> S-ceftriaxone, cipro, cefepime, zosyn.  R-ampicillin, bactrim, unasyn  ANTIBIOTICS: Aztreonam 8/4 >> 8/7 Vanco 8/4 >> 8/5 Cipro 8/8 >>  ASSESSMENT:  Acute Metabolic Encephalopathy - waxing / waning mental status, in setting of medications, E-Coli bacteremia / sepsis, hepatic dysfunction / elevated ammonia.   Plan: Follow neuro exam  Currently awake/alert & oriented Place NGT for lactulose / rifaxamin dosing   E-Coli Bacteremia / Sepsis - suspected intraabdominal source, has been on Etanercept (TNF alpha blockade) and hydroxychloroquine.  Not in shock/hemodynamically stable.   Plan: Antibiotics per primary MD Follow fever curve / WBC trend  Trend PCT Narrow abx  as able  Cholestatic Jaundice - GI following, suspects secondary to medication toxicity.  Has significant smoking hx and is on Etanercept which raises concern for possible undiagnosed malignancy.  Fatty liver on CT imaging but no significant ETOH hx.   Plan: Per GI / Primary MD  Continue medications as above  Hyperbilirubinemia  with elevated LDH and high Alkphos Plan: Per GI   CTA/P findings - concerning for colitis  Plan: ABX as above  Follow cultures   [facility] for SDU monitoring.    Noe Gens, NP-C [facility] Pgr: [phone] or if no answer [phone] 06/08/2018, 12:28 PM

## 2018-06-08 NOTE — Progress Notes (Signed)
[facility] Gastroenterology Progress Note  Jordan Russell 47 y.o. 10-06-1971   Subjective: Resting in bed but arousable. Denies abdominal pain. Husband in room.  Objective: Vital signs: Vitals:   06/08/18 0154 06/08/18 0759  BP: 116/62 (!) 105/58  Pulse: (!) 102 99  Resp: (!) 21   Temp:    SpO2: 94% 92%  T 99.6   Physical Exam: Gen: lethargic, no acute distress HEENT: +icteric sclera CV: RRR Chest: Coarse breath sounds; expiratory wheezing Abd: diffuse tenderness with guarding, mild distention, +BS Ext: 3+ pitting LE edema  Lab Results: Recent Labs    06/07/18 0424 06/08/18 0536  NA 134* 136  K 3.4* 3.1*  CL 103 104  CO2 20* 22  GLUCOSE 81 89  BUN 13 11  CREATININE 0.72 0.62  CALCIUM 7.6* 7.6*   Recent Labs    06/07/18 0424 06/08/18 0536  AST 87* 106*  ALT 63* 65*  ALKPHOS 490* 554*  BILITOT 15.3* 15.9*  PROT 3.8* 3.5*  ALBUMIN 1.6* 1.4*   Recent Labs    06/07/18 0424 06/08/18 0536  WBC 16.2* 18.3*  NEUTROABS 13.5* 14.0*  HGB 10.0* 9.5*  HCT 29.7* 28.5*  MCV 93.1 91.9  PLT 89* 80*    INR 1.25  Assessment/Plan: Cholestatic jaundice likely due to medication toxicity. Autoimmune markers negative. Liver biopsy 2 months ago negative for fibrosis or intrahepatic bile duct injury. An MRCP would be helpful to look for [facility] but liver biopsy was not consistent with PSC and doubt she could have an adequate study due to inability to hold her breath for the time needed for the MRI/MRCP. Agree with diuresis. I do NOT think her altered mental status is from her jaundice but likely needs a neurologic consult (defer to primary team whether to consult them). Would be hesitant to try steroids due to recent bacteremia. Need to stop all hepatotoxic meds. Will discuss further with her primary GI doctor, Dr. Therisa Doyne, and see if other recs. Will follow.   [facility] 06/08/2018, 10:34 AM  Questions please call [phone] ID: [name], female    DOB: [birth year], 47 y.o.   MRN: [id number]

## 2018-06-08 NOTE — Progress Notes (Addendum)
PROGRESS NOTE    Jordan Russell  TMH:[id number] DOB: Jan 30, 1971 DOA: 06/04/2018 PCP: Josetta Huddle, MD   Brief Narrative: Patient is a 47 year old female who presented with altered mental status. She has significant past medical from her arthritis, bipolar disease, hyperparathyroidism,status post gastric bypass who presents with acute worsening altered mentation. She gave history of edema of the lower extremities for the last 6 months, significant decreased p.o. intake for the last 3 years. Unable to ambulate due to severe edema of lower extremities. Reported abdominal pain and jaundice for the last few days prior to hospitalization.Drug screen positive for benzodiazepines.She was incteic on presentation.  She had a work-up with GI as an outpatient for drug-induced liver injury( plaquenil/eternacept) and had undergoing liver biopsy.CT of theabdomen  Showed patchy areas of colonic wall thickening, ascending colon and cecum consistent with colitis. Head CT negative for acute changes. Patientwas admitted to the hospital with the working diagnosis of possible sepsis.  Her hospital course was remarkable for bacteremia secondary to E. coli.  She continues to remain very lethargic.  GI following.  Found to have very elevated ammonia level today.  Critical care has been consulted for evaluation of hepatic encephalopathy.   Assessment & Plan:   Principal Problem:   Drug-induced liver injury Active Problems:   Bipolar disorder (HCC)   Anxiety   S/P gastric bypass   Severe sepsis (HCC)   Acute encephalopathy   Hyperbilirubinemia   Fatty infiltration of liver   AKI (acute kidney injury) ([place])   Protein-calorie malnutrition, severe   Elevated LFTs   Hyperammonemia (HCC)   Anasarca   Hepatic encephalopathy/elevated ammonia level/altered mental status: Ammonia level at 177 today.  Attempted to start on lactulose but patient is too lethargic to take orally.  We are attempting to put NG  tube but it was not successful.  Requested for IR guided NG tube placement.  In the meantime we will give her a dose of lactulose enema.  I have requested evaluation by critical care.  Discussed with Dr. Doyne Keel who will see the patient today. We will continue to monitor the patient.  We will continue to monitor the ammonia level. MRI done few days ago did not show any acute intracranial abnormalities.  Sepsis due to gram negative bacteremia/ E coli :  Currently hemodynamically stable.  E. coli was pansensitive.  Started on ciprofloxacin IV .Colitis iis the most likely source of bacteremia.  WBC is also trending up.  We have sent repeat blood cultures today.  We will check a chest x-ray today and also CT abdomen/pelvis as she was complaining of abdominal pain.  We might need to broaden the antibiotic coverage if needed.  Acute liver failure with elevated liver enzymes/DILI:  Liver failure thought to be medication induced.  She had a work-up with GI as an outpatient including liver biopsy.  Negative for primary biliary cholangitis or other intrahepatic biliary disease.  ANA, anti-smooth muscle antibody, anti-mitochondrial antibody negative. She was on Plaquinil and etanercept for rheumatoid arthritis which has been discontinued.  Liver enzymes creeping up .We have discontinued most of her home medications.  Gastroenterology following.  We will continue to monitor her liver function.Continue daily INR monitoring.  Ultrasound of the abdomen showed significant fatty infiltration.  AKI: Resolved.  But looks like she is intravascularly depleted.   RA. No active pain, will hold on plaquenil due to potential liver toxicity.   Sever malnutrition: Very low albumin. Will continue nutritional supplements with Boost Breeeze per nutrition  recommendations.   Bilateral severe lower extremity edema, anasarca: Most likely secondary to severe hypoalbuminemia, liver failure.Will put her on lasix 40 mg daily for  now.  Weakness/debility: Physical therapy evaluated the patient and recommended skilled nursing facility on discharge.  Social worker consulted  Multiple comorbidities/debility/poor prognosis: We have requested for palliative care consultation today.   DVT prophylaxis: SCD Code Status: Full Family Communication: None present at the bedside  Disposition Plan: Skilled nursing facility.  Patient is still not stable to be discharged.   Consultants: GI, PCCM  Procedures:None  Antimicrobials: Ciprofloxacin  Subjective: Patient seen and examined the bedside this morning.  Continues to remain weak and very deconditioned.  Very confused today.  Complains of abdominal pain.  Objective: Vitals:   06/08/18 0054 06/08/18 0154 06/08/18 0500 06/08/18 0759  BP: 114/61 116/62  (!) 105/58  Pulse: (!) 101 (!) 102  99  Resp: (!) 22 (!) 21    Temp:      TempSrc:      SpO2: 96% 94%  92%  Weight:   104.9 kg   Height:        Intake/Output Summary (Last 24 hours) at 06/08/2018 1311 Last data filed at 06/08/2018 0800 Gross per 24 hour  Intake 1325.11 ml  Output 2000 ml  Net -674.89 ml   Filed Weights   06/06/18 0338 06/07/18 0629 06/08/18 0500  Weight: 107.7 kg 102.8 kg 104.9 kg    Examination:  General exam:Weak ,cachetic, chronically ill looking HEENT:PERRL,Oral mucosa moist, Ear/Nose normal on gross exam Respiratory system: Bilateral equal air entry, normal vesicular breath sounds, no wheezes or crackles  Cardiovascular system: S1 & S2 heard, RRR. No JVD, murmurs, rubs, gallops or clicks.  Severe bilateral pedal edema. Gastrointestinal system: Abdomen is distended, soft and nontender. No organomegaly or masses felt. Normal bowel sounds heard. Central nervous system: Alert but not. No focal neurological deficits.  Lethargic Extremities:Anasarca, no clubbing ,no cyanosis, distal peripheral pulses palpable. Skin: No rashes, lesions or ulcers,no pallor.Icteric Psychiatry: Judgement and  insight appear impaired    Data Reviewed: I have personally reviewed following labs and imaging studies  CBC: Recent Labs  Lab 06/04/18 1618 06/04/18 1646 06/06/18 0650 06/07/18 0424 06/08/18 0536  WBC 13.4*  --  11.8* 16.2* 18.3*  NEUTROABS 11.9*  --  9.9* 13.5* 14.0*  HGB 12.8 12.2 9.5* 10.0* 9.5*  HCT 38.2 36.0 28.6* 29.7* 28.5*  MCV 93.9  --  93.8 93.1 91.9  PLT 158  158  --  PLATELET CLUMPS NOTED ON SMEAR, COUNT APPEARS DECREASED 89* 80*   Basic Metabolic Panel: Recent Labs  Lab 06/04/18 1618 06/04/18 1646 06/05/18 1219 06/06/18 0650 06/07/18 0424 06/08/18 0536  NA 132* 133* 133* 134* 134* 136  K 4.2 4.2 4.3 4.1 3.4* 3.1*  CL 98 101 103 103 103 104  CO2 19*  --  19* 20* 20* 22  GLUCOSE 59* 50* 75 81 81 89  BUN 18 18 19 18 13 11   CREATININE 1.25* 1.50* 1.46* 1.17* 0.72 0.62  CALCIUM 8.0*  --  7.4* 7.7* 7.6* 7.6*   GFR: Estimated Creatinine Clearance: 107.1 mL/min (by C-G formula based on SCr of 0.62 mg/dL). Liver Function Tests: Recent Labs  Lab 06/04/18 1618 06/05/18 1219 06/06/18 1438 06/07/18 0424 06/08/18 0536  AST 91* 61* 70* 87* 106*  ALT 91* 63* 59* 63* 65*  ALKPHOS 540* 358* 399* 490* 554*  BILITOT 14.3* 12.2* 14.0* 15.3* 15.9*  PROT 4.3* 3.5* 3.6* 3.8* 3.5*  ALBUMIN 1.9* 1.7*  1.6* 1.6* 1.4*   Recent Labs  Lab 06/04/18 1618  LIPASE 20   Recent Labs  Lab 06/04/18 1618 06/08/18 0916  AMMONIA 28 177*   Coagulation Profile: Recent Labs  Lab 06/04/18 1618 06/07/18 0424 06/08/18 0536  INR 1.30 1.31 1.25   Cardiac Enzymes: Recent Labs  Lab 06/04/18 1618 06/04/18 2258  TROPONINI 0.04* 0.04*   BNP (last 3 results) No results for input(s): PROBNP in the last 8760 hours. HbA1C: No results for input(s): HGBA1C in the last 72 hours. CBG: Recent Labs  Lab 06/07/18 1227 06/07/18 1753 06/08/18 0003 06/08/18 0623 06/08/18 1203  GLUCAP 101* 119* 121* 88 101*   Lipid Profile: No results for input(s): CHOL, HDL, LDLCALC, TRIG,  CHOLHDL, LDLDIRECT in the last 72 hours. Thyroid Function Tests: No results for input(s): TSH, T4TOTAL, FREET4, T3FREE, THYROIDAB in the last 72 hours. Anemia Panel: No results for input(s): VITAMINB12, FOLATE, FERRITIN, TIBC, IRON, RETICCTPCT in the last 72 hours. Sepsis Labs: Recent Labs  Lab 06/04/18 1631 06/04/18 1833 06/04/18 2258  PROCALCITON  --   --  23.93  LATICACIDVEN 6.17* 6.23* 5.1*    Recent Results (from the past 240 hour(s))  Blood Culture (routine x 2)     Status: Abnormal   Collection Time: 06/04/18  6:36 PM  Result Value Ref Range Status   Specimen Description   Final    BLOOD BLOOD RIGHT FOREARM Performed at [facility] [address] [place], [place] [address]    Special Requests   Final    BOTTLES DRAWN AEROBIC ONLY Blood Culture results may not be optimal due to an inadequate volume of blood received in culture bottles Performed at [facility] [address] [place], [place] [address]    Culture  Setup Time   Final    GRAM NEGATIVE RODS AEROBIC BOTTLE ONLY CRITICAL VALUE NOTED.  VALUE IS CONSISTENT WITH PREVIOUSLY REPORTED AND CALLED VALUE.    Culture (A)  Final    ESCHERICHIA COLI SUSCEPTIBILITIES PERFORMED ON PREVIOUS CULTURE WITHIN THE LAST 5 DAYS. Performed at [facility], [place] [address], [place], [place] [address]    Report Status 06/07/2018 FINAL  Final  Blood Culture (routine x 2)     Status: Abnormal (Preliminary result)   Collection Time: 06/04/18  6:36 PM  Result Value Ref Range Status   Specimen Description   Final    BLOOD BLOOD RIGHT HAND Performed at [facility] [address], [place], [place] [address]    Special Requests   Final    BOTTLES DRAWN AEROBIC ONLY Blood Culture results may not be optimal due to an inadequate volume of blood received in culture bottles Performed at [facility] [address], [place], [place] [address]     Culture  Setup Time   Final    GRAM NEGATIVE RODS AEROBIC BOTTLE ONLY CRITICAL RESULT CALLED TO, READ BACK BY AND VERIFIED WITH: Jene Every PharmD 12:25 06/05/18 ([email or web address])    Culture (A)  Final    ESCHERICHIA COLI Sent to [facility] for further susceptibility testing. Performed at [facility], [place] [address], [place], [place] [address]    Report Status PENDING  Incomplete   Organism ID, Bacteria ESCHERICHIA COLI  Final      Susceptibility   Escherichia coli - MIC*    AMPICILLIN >=32 RESISTANT Resistant     CEFAZOLIN <=4 SENSITIVE Sensitive     CEFEPIME <=1 SENSITIVE Sensitive  CEFTAZIDIME <=1 SENSITIVE Sensitive     CEFTRIAXONE <=1 SENSITIVE Sensitive     CIPROFLOXACIN <=0.25 SENSITIVE Sensitive     GENTAMICIN <=1 SENSITIVE Sensitive     IMIPENEM <=0.25 SENSITIVE Sensitive     TRIMETH/SULFA >=320 RESISTANT Resistant     AMPICILLIN/SULBACTAM 16 INTERMEDIATE Intermediate     PIP/TAZO <=4 SENSITIVE Sensitive     Extended ESBL NEGATIVE Sensitive     * ESCHERICHIA COLI  Blood Culture ID Panel (Reflexed)     Status: Abnormal   Collection Time: 06/04/18  6:36 PM  Result Value Ref Range Status   Enterococcus species NOT DETECTED NOT DETECTED Final   Listeria monocytogenes NOT DETECTED NOT DETECTED Final   Staphylococcus species NOT DETECTED NOT DETECTED Final   Staphylococcus aureus NOT DETECTED NOT DETECTED Final   Streptococcus species NOT DETECTED NOT DETECTED Final   Streptococcus agalactiae NOT DETECTED NOT DETECTED Final   Streptococcus pneumoniae NOT DETECTED NOT DETECTED Final   Streptococcus pyogenes NOT DETECTED NOT DETECTED Final   Acinetobacter baumannii NOT DETECTED NOT DETECTED Final   Enterobacteriaceae species DETECTED (A) NOT DETECTED Final    Comment: Enterobacteriaceae represent a large family of gram-negative bacteria, not a single organism. CRITICAL RESULT CALLED TO, READ BACK BY AND VERIFIED WITH: Jene Every PharmD 12:25 06/05/18 ([email or web address])    Enterobacter  cloacae complex NOT DETECTED NOT DETECTED Final   Escherichia coli DETECTED (A) NOT DETECTED Final    Comment: CRITICAL RESULT CALLED TO, READ BACK BY AND VERIFIED WITH: Jene Every PharmD 12:25 06/05/18 ([email or web address])    Klebsiella oxytoca NOT DETECTED NOT DETECTED Final   Klebsiella pneumoniae NOT DETECTED NOT DETECTED Final   Proteus species NOT DETECTED NOT DETECTED Final   Serratia marcescens NOT DETECTED NOT DETECTED Final   Carbapenem resistance NOT DETECTED NOT DETECTED Final   Haemophilus influenzae NOT DETECTED NOT DETECTED Final   Neisseria meningitidis NOT DETECTED NOT DETECTED Final   Pseudomonas aeruginosa NOT DETECTED NOT DETECTED Final   Candida albicans NOT DETECTED NOT DETECTED Final   Candida glabrata NOT DETECTED NOT DETECTED Final   Candida krusei NOT DETECTED NOT DETECTED Final   Candida parapsilosis NOT DETECTED NOT DETECTED Final   Candida tropicalis NOT DETECTED NOT DETECTED Final    Comment: Performed at [facility], [place] [address] [place], [place] [address]  MRSA PCR Screening     Status: None   Collection Time: 06/05/18  3:34 AM  Result Value Ref Range Status   MRSA by PCR NEGATIVE NEGATIVE Final    Comment:        The GeneXpert MRSA Assay (FDA approved for NASAL specimens only), is one component of a comprehensive MRSA colonization surveillance program. It is not intended to diagnose MRSA infection nor to guide or monitor treatment for MRSA infections. Performed at [facility], [place] [address] [place], [place] [address]          Radiology Studies: Dg Chest Port 1 View  Result Date: 06/08/2018 CLINICAL DATA:  Now onset elevated white count Hx asthma, current smoker 1/4 ppd for 20 years EXAM: PORTABLE CHEST 1 VIEW COMPARISON:  Chest x-rays dated 06/04/2018 12/17/2016. FINDINGS: Study is hypoinspiratory. Heart size and mediastinal contours are within normal limits. The bilateral interstitial and alveolar opacities are increased compared to the  previous exam, most dense/confluent within the periphery of the RIGHT upper lobe. No acute or suspicious osseous finding. IMPRESSION: 1. Worsening airspace opacities bilaterally, most dense/confluent within the periphery of the RIGHT upper lobe.  Findings are compatible with pulmonary edema and/or pneumonia. 2. Low lung volumes. Electronically Signed   By: Franki Cabot M.D.   On: 06/08/2018 13:00        Scheduled Meds: . feeding supplement  1 Container Oral TID BM  . furosemide  40 mg Intravenous Daily  . lactulose  30 g Per Tube Q6H  . lactulose  300 mL Rectal Once  . levothyroxine  25 mcg Intravenous Daily  . rifaximin  200 mg Oral BID   Continuous Infusions: . ciprofloxacin 400 mg (06/08/18 1150)  . famotidine (PEPCID) IV 20 mg (06/08/18 1114)  . potassium chloride 10 mEq (06/08/18 1303)     LOS: 4 days    Time spent:35 mins. More than 50% of that time was spent in counseling and/or coordination of care.      Shelly Coss, MD [facility] Pager [phone]  If 7PM-7AM, please contact night-coverage [email or web address] Password [email or web address] 06/08/2018, 1:11 PM

## 2018-06-08 NOTE — Progress Notes (Signed)
PT Cancellation Note  Patient Details Name: Jordan Russell MRN: [id number] DOB: 1971-09-13   Cancelled Treatment:    Reason Eval/Treat Not Completed: Patient at procedure or test/unavailable Per nurse, Pt being taken to radiology and not available for therapy. Will re-attempt as schedule allows.   Elwin Mocha, S-DPT Acute Care Rehab Student [phone]   06/08/2018, 1:45 PM

## 2018-06-08 NOTE — Progress Notes (Signed)
Informed Dr. Tawanna Solo that patient coughed up a golf ball sized blood clot. MD stated it is probably from the NGT placement but continue to monitor.

## 2018-06-08 NOTE — Progress Notes (Signed)
[facility] called to Clarify which type of tube Dr. Tawanna Solo would like placed and recommended a post pyloric tube. MD stated he is fine with a post pyloric tube. Fluro tech notified.

## 2018-06-08 NOTE — Progress Notes (Signed)
SLP Cancellation Note  Patient Details Name: Jordan Russell MRN: [id number] DOB: Sep 07, 1971   Cancelled treatment:       Reason Eval/Treat Not Completed: Medical issues which prohibited therapy(bleeding from attempted bridle placement for Cortrac)   Ryna Beckstrom 06/08/2018, 4:11 PM

## 2018-06-08 NOTE — Progress Notes (Signed)
Informed Dr. Tawanna Solo that [facility] team is not available today so fluro will have to place NGT. MD to place order.

## 2018-06-08 NOTE — Progress Notes (Addendum)
Brief Cortrak Team Note:   Contacted by [facility] regarding bridle placement. [facility] placed 10 F Cortrak tube in LEFT nare. RD attempted to place bridle in Radiology but unsuccessful. Pt bleeding from nares; right nare continuously bleeding and possibly swollen from multiple NG attempts earlier today. RT reports inability to even place guidewire in right nare during tube placement.   8/8 INR 1.25 8/8 Platelets 80  If desired, Cortrak Team can re-attempt bridle once bleeding stops and potential swelling from attempted NG placement subsides.  Kerman Passey MS, RD, [place], [place] [phone] Pager  [phone] Weekend/On-Call Pager

## 2018-06-09 ENCOUNTER — Ambulatory Visit: Payer: 59 | Admitting: Neurology

## 2018-06-09 DIAGNOSIS — J189 Pneumonia, unspecified organism: Secondary | ICD-10-CM

## 2018-06-09 DIAGNOSIS — E43 Unspecified severe protein-calorie malnutrition: Secondary | ICD-10-CM

## 2018-06-09 DIAGNOSIS — K719 Toxic liver disease, unspecified: Secondary | ICD-10-CM

## 2018-06-09 DIAGNOSIS — Z01810 Encounter for preprocedural cardiovascular examination: Secondary | ICD-10-CM

## 2018-06-09 DIAGNOSIS — K7200 Acute and subacute hepatic failure without coma: Secondary | ICD-10-CM

## 2018-06-09 DIAGNOSIS — R7881 Bacteremia: Secondary | ICD-10-CM

## 2018-06-09 DIAGNOSIS — K7682 Hepatic encephalopathy: Secondary | ICD-10-CM

## 2018-06-09 DIAGNOSIS — K529 Noninfective gastroenteritis and colitis, unspecified: Secondary | ICD-10-CM

## 2018-06-09 DIAGNOSIS — Z515 Encounter for palliative care: Secondary | ICD-10-CM

## 2018-06-09 DIAGNOSIS — K7290 Hepatic failure, unspecified without coma: Secondary | ICD-10-CM

## 2018-06-09 HISTORY — DX: Pneumonia, unspecified organism: J18.9

## 2018-06-09 LAB — COMPREHENSIVE METABOLIC PANEL
ALBUMIN: 1.4 g/dL — AB (ref 3.5–5.0)
ALT: 83 U/L — ABNORMAL HIGH (ref 0–44)
AST: 165 U/L — AB (ref 15–41)
Alkaline Phosphatase: 671 U/L — ABNORMAL HIGH (ref 38–126)
Anion gap: 11 (ref 5–15)
BILIRUBIN TOTAL: 18.3 mg/dL — AB (ref 0.3–1.2)
BUN: 15 mg/dL (ref 6–20)
CHLORIDE: 103 mmol/L (ref 98–111)
CO2: 22 mmol/L (ref 22–32)
Calcium: 7.8 mg/dL — ABNORMAL LOW (ref 8.9–10.3)
Creatinine, Ser: 0.69 mg/dL (ref 0.44–1.00)
GFR calc Af Amer: >60 mL/min (ref >60)
GFR calc non Af Amer: >60 mL/min (ref >60)
GLUCOSE: 111 mg/dL — AB (ref 70–99)
Potassium: 3.4 mmol/L — ABNORMAL LOW (ref 3.5–5.1)
SODIUM: 136 mmol/L (ref 135–145)
Total Protein: 3.7 g/dL — ABNORMAL LOW (ref 6.5–8.1)

## 2018-06-09 LAB — GLUCOSE, CAPILLARY
Glucose-Capillary: 106 mg/dL — ABNORMAL HIGH (ref 70–99)
Glucose-Capillary: 112 mg/dL — ABNORMAL HIGH (ref 70–99)
Glucose-Capillary: 114 mg/dL — ABNORMAL HIGH (ref 70–99)
Glucose-Capillary: 117 mg/dL — ABNORMAL HIGH (ref 70–99)

## 2018-06-09 LAB — CBC WITH DIFFERENTIAL/PLATELET
BASOS PCT: 1 %
Basophils Absolute: 0.2 10*3/uL — ABNORMAL HIGH (ref 0.0–0.1)
EOS PCT: 0 %
Eosinophils Absolute: 0 10*3/uL (ref 0.0–0.7)
HEMATOCRIT: 28.3 % — AB (ref 36.0–46.0)
HEMOGLOBIN: 9.4 g/dL — AB (ref 12.0–15.0)
LYMPHS ABS: 1.3 10*3/uL (ref 0.7–4.0)
LYMPHS PCT: 6 %
MCH: 30.8 pg (ref 26.0–34.0)
MCHC: 33.2 g/dL (ref 30.0–36.0)
MCV: 92.8 fL (ref 78.0–100.0)
MONOS PCT: 7 %
Monocytes Absolute: 1.6 10*3/uL — ABNORMAL HIGH (ref 0.1–1.0)
NEUTROS ABS: 19.3 10*3/uL — AB (ref 1.7–7.7)
Neutrophils Relative %: 86 %
Platelets: 104 10*3/uL — ABNORMAL LOW (ref 150–400)
RBC: 3.05 MIL/uL — ABNORMAL LOW (ref 3.87–5.11)
RDW: 16.7 % — ABNORMAL HIGH (ref 11.5–15.5)
WBC: 22.4 10*3/uL — ABNORMAL HIGH (ref 4.0–10.5)

## 2018-06-09 LAB — VITAMIN B12: VITAMIN B 12: 6547 pg/mL — AB (ref 180–914)

## 2018-06-09 LAB — PROTIME-INR
INR: 1.25
Prothrombin Time: 15.6 seconds — ABNORMAL HIGH (ref 11.4–15.2)

## 2018-06-09 LAB — AMMONIA: Ammonia: 126 umol/L — ABNORMAL HIGH (ref 9–35)

## 2018-06-09 MED ORDER — SODIUM CHLORIDE 0.9 % IV SOLN
2.0000 g | Freq: Three times a day (TID) | INTRAVENOUS | Status: DC
  Administered 2018-06-09: 2 g via INTRAVENOUS
  Filled 2018-06-09 (×3): qty 2

## 2018-06-09 MED ORDER — RIFAXIMIN 550 MG PO TABS
550.0000 mg | ORAL_TABLET | Freq: Two times a day (BID) | ORAL | Status: DC
  Administered 2018-06-09: 550 mg via ORAL
  Filled 2018-06-09: qty 1

## 2018-06-09 MED ORDER — RIFAXIMIN 550 MG PO TABS: 550.0000 mg | ORAL_TABLET | Freq: Two times a day (BID) | ORAL | Status: DC

## 2018-06-09 MED ORDER — FAMOTIDINE IN NACL 20-0.9 MG/50ML-% IV SOLN: 20.0000 mg | Freq: Two times a day (BID) | INTRAVENOUS | Status: DC

## 2018-06-09 MED ORDER — SODIUM CHLORIDE 0.9 % IV SOLN
INTRAVENOUS | Status: DC
  Administered 2018-06-09: 12:00:00 via INTRAVENOUS

## 2018-06-09 MED ORDER — VANCOMYCIN HCL IN DEXTROSE 1-5 GM/200ML-% IV SOLN
1000.0000 mg | Freq: Three times a day (TID) | INTRAVENOUS | Status: DC
  Filled 2018-06-09 (×2): qty 200

## 2018-06-09 MED ORDER — PRO-STAT SUGAR FREE PO LIQD: 30.0000 mL | Freq: Three times a day (TID) | ORAL | Status: DC

## 2018-06-09 MED ORDER — VANCOMYCIN HCL 10 G IV SOLR
2000.0000 mg | Freq: Once | INTRAVENOUS | Status: AC
  Administered 2018-06-09: 2000 mg via INTRAVENOUS
  Filled 2018-06-09: qty 2000

## 2018-06-09 MED ORDER — SODIUM CHLORIDE 0.9 % IV SOLN: 2.0000 g | Freq: Three times a day (TID) | INTRAVENOUS | Status: DC

## 2018-06-09 MED ORDER — LACTULOSE 10 GM/15ML PO SOLN: 30.0000 g | Freq: Four times a day (QID) | ORAL | 0 refills | Status: DC

## 2018-06-09 MED ORDER — LEVOTHYROXINE SODIUM 100 MCG IV SOLR: 25.0000 ug | Freq: Every day | INTRAVENOUS | Status: DC

## 2018-06-09 MED ORDER — ONDANSETRON HCL 4 MG/2ML IJ SOLN: 4.0000 mg | Freq: Four times a day (QID) | INTRAMUSCULAR | 0 refills | Status: DC | PRN

## 2018-06-09 MED ORDER — VANCOMYCIN HCL IN DEXTROSE 1-5 GM/200ML-% IV SOLN: 1000.0000 mg | Freq: Three times a day (TID) | INTRAVENOUS | Status: DC

## 2018-06-09 MED ORDER — VITAL 1.5 CAL PO LIQD
1000.0000 mL | ORAL | Status: DC
  Filled 2018-06-09: qty 1000

## 2018-06-09 MED ORDER — FUROSEMIDE 10 MG/ML IJ SOLN
40.0000 mg | Freq: Two times a day (BID) | INTRAMUSCULAR | Status: DC
  Administered 2018-06-09: 40 mg via INTRAVENOUS

## 2018-06-09 MED ORDER — POTASSIUM CHLORIDE 10 MEQ/100ML IV SOLN
10.0000 meq | INTRAVENOUS | Status: AC
  Administered 2018-06-09 (×3): 10 meq via INTRAVENOUS
  Filled 2018-06-09 (×3): qty 100

## 2018-06-09 MED ORDER — FUROSEMIDE 10 MG/ML IJ SOLN: 40.0000 mg | Freq: Two times a day (BID) | INTRAMUSCULAR | 0 refills | Status: DC

## 2018-06-09 NOTE — Progress Notes (Signed)
Occupational Therapy Treatment Patient Details Name: Jordan Russell MRN: [id number] DOB: 12/30/1970 Today's Date: 06/09/2018    History of present illness Jordan Russell isa 47 y.o. F who presented to the ED with increased BLE swelling, yellowing of the skin, and nausea. Previous visit 7/23 for a fall and hitting her head. PMH includes Bipolar disorder, anixiety, fatty infiltration of the liver, AKI, RA, IBS, Colitis, Encephalopathy, Hyperbilirubinism, hyperparathyroidism s/p thyroidectomy, gastroparesis, nephrolithiasis, s/p gastric bypass.   OT comments  This 47 yo female seen today with PT presented rather lethargic, not following commands, eyes open (but with head mostly turned to left (could turn to right with increased time and cues). No balance reactions when support was removed while pt seated EOB. Pt will continue to benefit from acute OT with follow up at SNF. Pt to transfer to [facility] today, if delayed we will continue to see pt at first of week.  Follow Up Recommendations  SNF;Supervision/Assistance - 24 hour    Equipment Recommendations  Other (comment)(TBD at next venue)       Precautions / Restrictions Precautions Precautions: Fall Restrictions Weight Bearing Restrictions: No       Mobility Bed Mobility Overal bed mobility: Needs Assistance Bed Mobility: Rolling;Supine to Sit;Sit to Supine Rolling: Total assist   Supine to sit: Total assist;+2 for physical assistance Sit to supine: Total assist;+2 for physical assistance      Transfers Unsafe at this time                      Balance Overall balance assessment: Needs assistance Sitting-balance support: Bilateral upper extremity supported;No upper extremity supported Sitting balance-Leahy Scale: Zero Sitting balance - Comments: pt without balance reactions, she was moving right to left on her own while seated with VC's but with posterior lean, jerking movements at time                                   ADL either performed or assessed with clinical judgement   ADL Overall ADL's : Needs assistance/impaired                                       General ADL Comments: total A -total A +2 bed level     Vision Patient Visual Report: No change from baseline            Cognition Arousal/Alertness: Lethargic Behavior During Therapy: Flat affect Overall Cognitive Status: Impaired/Different from baseline Area of Impairment: Following commands;Safety/judgement;Problem solving;Attention                   Current Attention Level: Focused     Safety/Judgement: Decreased awareness of safety;Decreased awareness of deficits   Problem Solving: Decreased initiation;Requires verbal cues;Requires tactile cues General Comments: Not answering questions today verbally and hard to know for sure with head nods other than when we asked if she wanted to lay down                   Pertinent Vitals/ Pain       Pain Assessment: Faces Pain Location: unknown Pain Descriptors / Indicators: Moaning Pain Intervention(s): Monitored during session;Repositioned         Frequency  Min 2X/week        Progress Toward Goals  OT Goals(current goals can now be  found in the care plan section)  Progress towards OT goals: Not progressing toward goals - comment(lethargy, decreasesd command following)     Plan Discharge plan remains appropriate    Co-evaluation    PT/OT/SLP Co-Evaluation/Treatment: Yes Reason for Co-Treatment: Complexity of the patient's impairments (multi-system involvement);Necessary to address cognition/behavior during functional activity;For patient/therapist safety   OT goals addressed during session: Strengthening/ROM      AM-PAC PT "6 Clicks" Daily Activity     Outcome Measure   Help from another person eating meals?: Total Help from another person taking care of personal grooming?: Total Help from another person  toileting, which includes using toliet, bedpan, or urinal?: Total Help from another person bathing (including washing, rinsing, drying)?: Total Help from another person to put on and taking off regular upper body clothing?: Total Help from another person to put on and taking off regular lower body clothing?: Total 6 Click Score: 6    End of Session    OT Visit Diagnosis: Muscle weakness (generalized) (M62.81);Other abnormalities of gait and mobility (R26.89);Pain Pain - part of body: (pt unable to state)   Activity Tolerance Patient limited by lethargy   Patient Left in bed;with bed alarm set;with nursing/sitter in room   Nurse Communication (RN in room at end of session). Did ask RN about the fact pt is tending to have head turned to right and did so also in sitting with difficulty finding family/friends on right--RN reports this is not new.        Time: 1024-1101 OT Time Calculation (min): 37 min  Charges: OT General Charges $OT Visit: 1 Visit OT Treatments $Self Care/Home Management : 8-22 mins [place], OTR/L [phone] 06/09/2018

## 2018-06-09 NOTE — Progress Notes (Signed)
CRITICAL VALUE STICKER  CRITICAL VALUE: total bilirubin 18.3  RECEIVER (on-site recipient of call): Sam Kimberley Speece RN  MD NOTIFIED: Tawanna Solo via text page

## 2018-06-09 NOTE — Progress Notes (Signed)
Nutrition Follow-up  DOCUMENTATION CODES:   Severe malnutrition in context of chronic illness  INTERVENTION:   Initiate TF via Cortrak: - Vital 1.5 @ 20 ml/hr, increase by 10 ml q 8 hours until goal rate of 45 ml/hr is reached - 30 ml Pro-stat TID  Tube feeding regimen at goal rate provides 1920 kcal, 118 grams of protein, and 821 ml of H2O.  - d/c Boost Breeze  NUTRITION DIAGNOSIS:   Severe Malnutrition related to chronic illness (gastroparesis) as evidenced by energy intake < or equal to 75% for > or equal to 1 month, severe fat depletion, moderate muscle depletion, edema.  Ongoing  GOAL:   Patient will meet greater than or equal to 90% of their needs  Progressing, will be met via TF at goal rate  MONITOR:   PO intake, I & O's, Labs, Supplement acceptance, Weight trends  REASON FOR ASSESSMENT:   Consult Enteral/tube feeding initiation and management  ASSESSMENT:   Patient with PMH gastroparesis, nephrolithiasis, s/p gastric bypass, and decrease in mental and functional status over the last 3-6 months. She has had multiple falls and hit her head on 7/23. Presents with severe sepsis secondary to suspected colitis, acute encephalopathy with facial droop and aphasia, and AKI. Patient has been dealing with progressively worsening slurred speech, right sided facial droop, and difficulty with her memory since June.  8/8 - attempted post-pyloric Cortrak tube placed by IR, attempted bridle placement but stopped due to bleeding  Pt is being treated for sepsis and acute liver failure thought to be medication-induced.  Discussed pt with RN.  MD approved gastric tube feeding at this time. RD to utilize calorie dense formula to decrease total volume given. Will initiate at low rate and increase as tolerated. Discussed plan with pt's husband.  Meal Completion: 25-65% (now NPO)  Medications reviewed and include: 40 mg Lasix BID, 30 grams lactulose q 6 hours, 25 mcg levothyroxine  daily, 20 mg Pepcid BID, 10 mEq KCl x 3 runs today  Labs reviewed: potassium 3.4 (L), hemoglobin 9.4 (L), HCT 28.3 (L) CBG's: 114, 112, 110, 101 x 24 hours  UOP: 2675 ml x 24 hours I/O's: -3.8 L since admission  Diet Order:   Diet Order            Diet NPO time specified  Diet effective now              EDUCATION NEEDS:   Education needs have been addressed  Skin:  Skin Assessment: Reviewed RN Assessment  Last BM:  06/09/18 large type 6  Height:   Ht Readings from Last 1 Encounters:  06/08/18 _0  (1.702 m)    Weight:   Wt Readings from Last 1 Encounters:  06/09/18 103.3 kg    Ideal Body Weight:  61.36 kg  BMI:  Body mass index is 35.67 kg/m.  Estimated Nutritional Needs:   Kcal:  1900-2100 kcal  Protein:  115-130 grams  Fluid:  1.9-2.1 L    Gaynell Face, MS, RD, LDN Pager: [phone] Weekend/After Hours: [phone]

## 2018-06-09 NOTE — Care Management Note (Addendum)
Case Management Note  Patient Details  Name: Jordan Russell MRN: [id number] Date of Birth: 01/27/1971  Subjective/Objective:      Presented with altered mental status/hepatic encephalopathy/ elevate ammonia level, hx of arthritis, bipolar disease, hyperparathyroidism,status post gastric bypass. PTA independent with ADL's, no DME usage.                                       Hospital stay: bacteremia secondary to E. Coli and development of pneumonia  Keyundra Fant (Spouse) Angie Fava (Mother)      [phone]          Action/Plan: Transfer to [place]  Expected Discharge Date:  06/09/18               Expected Discharge Plan:  Acute to acute transfer / [facility]  In-House Referral:  Clinical Social Work  Discharge planning Services  CM Consult  Post Acute Care Choice:    Choice offered to:     DME Arranged:   N/A DME Agency:   N/A  HH Arranged:   N/A HH Agency:   N/A  Status of Service:  Completed, signed off  If discussed at [place] of Stay Meetings, dates discussed:    Additional Comments:  Sharin Mons, RN 06/09/2018, 11:35 AM

## 2018-06-09 NOTE — Progress Notes (Signed)
SLP Cancellation Note  Patient Details Name: Jordan Russell MRN: [id number] DOB: 10/13/1971   Cancelled treatment:       Reason Eval/Treat Not Completed: Other (comment) Per chart review and discussion with RN, pt lethargic today and anticipating transfer to [facility] Will defer evaluation at this time.   Germain Osgood 06/09/2018, 2:36 PM  Germain Osgood, M.A. CCC-SLP [phone]

## 2018-06-09 NOTE — Progress Notes (Signed)
CSW notes patient is transferring to [facility]. This CSW signing off.  Percell Locus Mkayla Steele LCSW [phone]

## 2018-06-09 NOTE — Progress Notes (Signed)
Daily Progress Note   Patient Name: Jordan Russell       Date: 06/09/2018 DOB: May 03, 1971  Age: 47 y.o. MRN#: [id number] Attending Physician: Shelly Coss, MD Primary Care Physician: Josetta Huddle, MD Admit Date: 06/04/2018  Reason for Consultation/Follow-up: Establishing goals of care  Subjective:  6 months of office notes were obtained from her PCP, Dr. Mertha Finders, and reviewed.  I have left them on her hard chart to go to [facility] when she is transferred.  Patient is encephalopathic and her speech does not make sense.  I spoke to her husband in the conference room privately. I responded to many of the questions her family had raised the day before.  I explained that she has severe acute illness in the setting of chronic illness.  Specifically we discussed her GI tract that simply does not have much motility at all despite trying many remedies; the hypothyroidism that appears to have worsened in the last few months despite taking synthroid; her cognition that has been degraded due to medications/ECT/previous medication toxicity and her severe malnutrition.  Even if Natlie makes it thru this acute episode, I'm very concerned that her baseline health is very poor and her prognosis will not be good.  We discussed code status once again.  Abe People states she will remain full code until he is told that she is imminently dying.   Assessment: Patient appears slightly worse today as she has developed new increased work of breathing. She is still completely encephalopathic.  LFTs still rising.  WBC still rising.   Patient Profile/HPI:  47 y.o. female  with past medical history of gastric bypass, rheumatoid arthritis, thyroid disease, severe constipation (bowel movement q 2-3 weeks),  gastroparesis, elevated liver enzymes, bipolar disorder (with h/o lithium toxicity), hyperparathyroidism, and kidney stones who was admitted on 06/04/2018 with altered mental status.  She was found to be severely septic due to acute colitis.  She was jaundiced with a bilirubin of over 14 and elevated PT/INR indicating acute liver failure.  She also had acute kidney injury.  Further workup indicated severe malnutrition and e-coli bacteremia.  Over the course of several days her ammonia levels have risen.  Her albumin is currently 1.4.     Length of Stay: 5  Current Medications: Scheduled Meds:  . feeding supplement (PRO-STAT SUGAR FREE  64)  30 mL Per Tube TID  . feeding supplement (VITAL 1.5 CAL)  1,000 mL Per Tube Q24H  . furosemide  40 mg Intravenous Q12H  . lactulose  30 g Per Tube Q6H  . levothyroxine  25 mcg Intravenous Daily  . rifaximin  550 mg Oral BID    Continuous Infusions: . sodium chloride 10 mL/hr at 06/09/18 1138  . ceFEPime (MAXIPIME) IV 2 g (06/09/18 1139)  . famotidine (PEPCID) IV 20 mg (06/09/18 1108)  . potassium chloride 10 mEq (06/09/18 1310)  . vancomycin 2,000 mg (06/09/18 1316)  . vancomycin      PRN Meds: albuterol, ondansetron **OR** ondansetron (ZOFRAN) IV  Physical Exam        Well developed chronically ill appearing female, Jaundice 3+, encephalopathic unable to answer questions Dried blood in her mouth CV rrr resp increased work of breathing Abdomen obese, nt,nd LE 3+ edema.  Vital Signs: BP 114/75   Pulse 90   Temp 98 F (36.7 C) (Axillary)   Resp (!) 27   Ht '5\' 7"'  (1.702 m)   Wt 103.3 kg   SpO2 94%   BMI 35.67 kg/m  SpO2: SpO2: 94 % O2 Device: O2 Device: Nasal Cannula O2 Flow Rate: O2 Flow Rate (L/min): 2 L/min  Intake/output summary:   Intake/Output Summary (Last 24 hours) at 06/09/2018 1317 Last data filed at 06/09/2018 1700 Gross per 24 hour  Intake 606.27 ml  Output 1475 ml  Net -868.73 ml   LBM: Last BM Date:  06/08/18 Baseline Weight: Weight: 94.8 kg Most recent weight: Weight: 103.3 kg       Palliative Assessment/Data:  10%    Flowsheet Rows     Most Recent Value  Intake Tab  Referral Department  Hospitalist  Unit at Time of Referral  Intermediate Care Unit  Palliative Care Primary Diagnosis  Other (Comment)  Date Notified  06/08/18  Palliative Care Type  New Palliative care  Reason for referral  Clarify Goals of Care  Date of Admission  06/04/18  Date first seen by Palliative Care  06/08/18  # of days Palliative referral response time  0 Day(s)  # of days IP prior to Palliative referral  4  Clinical Assessment  Psychosocial & Spiritual Assessment  Palliative Care Outcomes      Patient Active Problem List   Diagnosis Date Noted  . HCAP (healthcare-associated pneumonia) 06/09/2018  . Colitis   . Encephalopathy, hepatic ([place])   . Drug-induced liver injury 06/08/2018  . Elevated LFTs 06/08/2018  . Hyperammonemia ([place]) 06/08/2018  . Anasarca 06/08/2018  . Acute encephalopathy 06/05/2018  . Hypoxia 06/05/2018  . Hyperbilirubinemia 06/05/2018  . Fatty infiltration of liver 06/05/2018  . AKI (acute kidney injury) ([place]) 06/05/2018  . Protein-calorie malnutrition, severe 06/05/2018  . Severe sepsis ([place]) 06/04/2018  . Trochanteric bursitis, left hip 09/08/2017  . Pain in left hip 09/08/2017  . Asthma with acute exacerbation 12/17/2016  . Carpal tunnel syndrome of left wrist 12/17/2016  . Dysphagia, pharyngoesophageal phase 12/17/2016  . Fibromyalgia 12/17/2016  . H/O vitamin D deficiency 12/17/2016  . Hidradenitis suppurativa 12/17/2016  . Calculus of kidney 12/17/2016  . Hypoglycemia 12/17/2016  . Hyperglycemia 12/17/2016  . Hydronephrosis with renal and ureteral calculus obstruction 12/17/2016  . Hydradenitis 12/17/2016  . Hx of iron deficiency anemia 12/17/2016  . HPTH (hyperparathyroidism) ([place]) 12/17/2016  . Nocturia 12/17/2016  . Intractable migraine with aura  without status migrainosus 12/17/2016  . Pain in left knee 12/17/2016  .  Arthralgia of multiple joints 12/17/2016  . Avitaminosis D 12/17/2016  . Anemia, iron deficiency 12/17/2016  . Cutaneous eruption 12/17/2016  . Pain in joint 12/17/2016  . Varicose veins of both legs with edema 06/28/2016  . Adverse effects of medication 03/10/2016  . Bipolar disorder ([place]) 03/10/2016  . Rheumatoid arthritis ([place]) 03/10/2016  . Migraine 03/10/2016  . IBS (irritable bowel syndrome) 03/10/2016  . Anxiety 03/10/2016  . Asthma in adult 03/10/2016  . GERD (gastroesophageal reflux disease) 03/10/2016  . Epigastric pain 06/04/2015  . Pharyngoesophageal dysphagia 02/10/2015  . S/P right knee arthroscopy 04/16/2014  . Chondromalacia of knee 02/26/2014  . Knee pain 02/20/2014  . Bilateral knee pain 02/05/2014  . Vitamin D deficiency 12/14/2013  . S/P gastric bypass 12/14/2013    Palliative Care Plan    Recommendations/Plan:  Question if steroids would be of benefit given liver failure, colitis, respiratory issues.  Agree with transfer to [facility]   PCP records are on the hard chart  Family will need a great deal of support and education.  On-going palliative care consultation would be helpful.  Goals of Care and Additional Recommendations:  Limitations on Scope of Treatment: Full Scope Treatment  Code Status:  Full code  Prognosis:   Unable to determine   She is at high risk for acute decline and death.  She is acutely ill with Acute liver failure, E coli bacteremia, likely pneumonia in the setting of multiple poorly understood co morbidities including hypothyroidism (not responsive to synthroid), hyperparathyroidism s/p removal of 2 glands, Rheumatoid arthritis on long term enbril and plaquenil, fatty liver disease with long term elevation of alk phos, and poorly functioning GI track (ectatic esophagus, gastroparesis, severe on-going constipation) and severe malnutrition.   Discharge  Planning:  To [facility] for further evaluation and treatment.  Care plan was discussed with husband Abe People, Attending physician Dr. Tawanna Solo  Thank you for allowing the Palliative Medicine Team to assist in the care of this patient.  Total time spent:  35 min.     Greater than 50%  of this time was spent counseling and coordinating care related to the above assessment and plan.  Florentina Jenny, PA-C Palliative Medicine  Please contact Palliative MedicineTeam phone at [phone] for questions and concerns between 7 am - 7 pm.   Please see AMION for individual provider pager numbers.

## 2018-06-09 NOTE — Progress Notes (Signed)
Physical Therapy Treatment Patient Details Name: NAJIA HURLBUTT MRN: [id number] DOB: 1970/12/31 Today's Date: 06/09/2018    History of Present Illness Myrene Bougher isa 47 y.o. F who presented to the ED with increased BLE swelling, yellowing of the skin, and nausea. Previous visit 7/23 for a fall and hitting her head. PMH includes Bipolar disorder, anixiety, fatty infiltration of the liver, AKI, RA, IBS, Colitis, Encephalopathy, Hyperbilirubinism, hyperparathyroidism s/p thyroidectomy, gastroparesis, nephrolithiasis, s/p gastric bypass.    PT Comments     Co treatment with OT for safety. Patient is lethargic, unable to follow commands, and eyes opened for majority of the time. Session focused on transitioning to edge of bed, static/dynamic sitting balance, and bed mobility to perform peri care. Requiring up to total assistance for all functional mobility. Poor sitting balance with no balance reactions noted. Patient with right sided inattention. D/c plan remains appropriate. Pt expected to transfer to [facility] today.    Follow Up Recommendations  Supervision/Assistance - 24 hour;SNF     Equipment Recommendations  Other (comment)(defer to next venue)    Recommendations for Other Services       Precautions / Restrictions Precautions Precautions: Fall Restrictions Weight Bearing Restrictions: No    Mobility  Bed Mobility Overal bed mobility: Needs Assistance Bed Mobility: Rolling;Supine to Sit;Sit to Supine Rolling: Total assist;+2 for physical assistance   Supine to sit: Total assist;+2 for physical assistance Sit to supine: Total assist;+2 for physical assistance   General bed mobility comments: total assist for rolling to perform pericare and supine <> sit. patient with pain rolling to the right.  Transfers                    Ambulation/Gait                 Stairs             Wheelchair Mobility    Modified Rankin (Stroke Patients Only)        Balance Overall balance assessment: Needs assistance Sitting-balance support: Bilateral upper extremity supported;No upper extremity supported Sitting balance-Leahy Scale: Zero Sitting balance - Comments: pt without balance reactions, she was moving minimally to the right and then to the left on her own while seated with VC's but with posterior lean, jerking movements at times                                    Cognition Arousal/Alertness: Lethargic Behavior During Therapy: Flat affect Overall Cognitive Status: Impaired/Different from baseline Area of Impairment: Following commands;Safety/judgement;Problem solving;Attention                 Orientation Level: Disoriented to;Time;Situation Current Attention Level: Focused   Following Commands: Follows one step commands inconsistently Safety/Judgement: Decreased awareness of safety;Decreased awareness of deficits   Problem Solving: Decreased initiation;Requires verbal cues;Requires tactile cues General Comments: Not answering questions today verbally and hard to know for sure with head nods other than when we asked if she wanted to lay down      Exercises Other Exercises Other Exercises: Static and dynamic sitting balance at edge of bed x ~15 minutes with head turns to right and left, lateral weight shifting, and balance perturbations    General Comments        Pertinent Vitals/Pain Pain Assessment: Faces Faces Pain Scale: Hurts even more Pain Location: generalized Pain Descriptors / Indicators: Moaning Pain Intervention(s): Monitored during session;Repositioned  Home Living                      Prior Function            PT Goals (current goals can now be found in the care plan section) Acute Rehab PT Goals Patient Stated Goal: none stated Potential to Achieve Goals: Fair Progress towards PT goals: Not progressing toward goals - comment(fatigued and in pain)    Frequency     Min 2X/week      PT Plan Current plan remains appropriate    Co-evaluation PT/OT/SLP Co-Evaluation/Treatment: Yes Reason for Co-Treatment: Complexity of the patient's impairments (multi-system involvement);For patient/therapist safety;To address functional/ADL transfers;Necessary to address cognition/behavior during functional activity PT goals addressed during session: Mobility/safety with mobility OT goals addressed during session: Strengthening/ROM      AM-PAC PT "6 Clicks" Daily Activity  Outcome Measure  Difficulty turning over in bed (including adjusting bedclothes, sheets and blankets)?: Unable Difficulty moving from lying on back to sitting on the side of the bed? : Unable Difficulty sitting down on and standing up from a chair with arms (e.g., wheelchair, bedside commode, etc,.)?: Unable Help needed moving to and from a bed to chair (including a wheelchair)?: Total Help needed walking in hospital room?: Total Help needed climbing 3-5 steps with a railing? : Total 6 Click Score: 6    End of Session Equipment Utilized During Treatment: Oxygen Activity Tolerance: Patient limited by fatigue;Patient limited by pain Patient left: in bed;with bed alarm set;with call bell/phone within reach;with family/visitor present Nurse Communication: Mobility status PT Visit Diagnosis: Other abnormalities of gait and mobility (R26.89);Muscle weakness (generalized) (M62.81);History of falling (Z91.81);Difficulty in walking, not elsewhere classified (R26.2);Pain Pain - Right/Left: Right Pain - part of body: Knee;Ankle and joints of foot     Time: 1024-1101 PT Time Calculation (min) (ACUTE ONLY): 37 min  Charges:  $Therapeutic Activity: 8-22 mins                     Ellamae Sia, PT, DPT Acute Rehabilitation Services  Pager: [phone]    Willy Eddy 06/09/2018, 1:02 PM

## 2018-06-09 NOTE — Discharge Summary (Signed)
Physician Discharge Summary  Jordan Russell OFB:[id number] DOB: 02-19-1971 DOA: 06/04/2018  PCP: Josetta Huddle, MD  Admit date: 06/04/2018 Discharge date: 06/09/2018  Admitted From: Home Disposition: Transfer to [facility]  Discharge Condition:Stable CODE STATUS:FULL Diet recommendation: Heart Healthy.Not tolerating by mouth. Currently on tube feeding  Brief/Interim Summary:  Patient is a 47 year old female who presented with altered mental status. She has significant past medical from her arthritis, bipolar disease, hyperparathyroidism,status post gastric bypass . She gave history of edema of the lower extremities for the last 6 months, significant decreased p.o. intake for the last 3 years. She was unable to ambulate due to severe edema of lower extremities. Reported abdominal pain and jaundice for the last few days prior to hospitalization.Drug screen positive for benzodiazepines.She was icteic on presentation.  She had a work-up with GI as an outpatient for drug-induced liver injury( plaquenil/eternacept) and had undergoing liver biopsy.CT of theabdomen  on admission showed patchy areas of colonic wall thickening, ascending colon and cecum consistent with colitis. Head CT negative for acute changes. Patientwas admitted to the hospital with the working diagnosis of  sepsis and drug induced liver injury. Her hospital course was remarkable for bacteremia secondary to E. Coli and development of pneumonia.Her ammonia level markedly increased during this hospitalization and she became more confused and lethargic.She continues to remain very lethargic.  GI was following.  Since her liver enzymes are trending ,we  discussed with gastroenterology and decided to transfer her to [facility] for care under hepatology team. She has already been accepted there for transfer by hepatologist  Dr.Neil Manuella Ghazi.  I have discussed with hospitalist Dr. Gordy Clement today for the transfer.  Following problems were  addressed during her hospitalization:  Hepatic encephalopathy/elevated ammonia level/altered mental status: Ammonia level trended down to 123 today.  Patient is too lethargic to take orally.    Continue lactulose and rifaximin through Cortrak tube   .MRI done few days ago did not show any acute intracranial abnormalities.  Sepsis due to g E coli /healthcare associated pneumonia: Currently hemodynamically stable.  E. coli was pansensitive.  She was on  ciprofloxacin IV .Colitis was the most likely source of bacteremia.  WBC is also trending up.  We have sent repeat blood cultures .  Chest x-ray  and  CT abdomen/pelvis done on 06/08/18 were suggestive of bilateral pneumonia, which is a new finding, so antibiotics changed to vancomycin and cefepime.  Acute liver failure with elevated liver enzymes/DILI:  Liver failure thought to be medication induced.  She had a work-up with GI as an outpatient including liver biopsy.  Negative for primary biliary cholangitis or other intrahepatic biliary disease.  ANA, anti-smooth muscle antibody, anti-mitochondrial antibody negative. She was on Plaquinil and etanercept for rheumatoid arthritis which has been discontinued.  Liver enzymes creeping up .We have discontinued most of her home medications.  Gastroenterology following. Continue daily INR monitoring.  Ultrasound of the abdomen showed significant fatty infiltration.  AKI: Resolved.  RA.No active pain, will hold on plaquenil due to potential liver toxicity.   Sever malnutrition: Very low albumin. Will continue nutritional supplements with Boost Breeeze per nutrition recommendations. Currently on tube feeding.  Bilateral severe lower extremity edema, anasarca: Most likely secondary to severe hypoalbuminemia, liver failure.Will put her on IV lasix 40 mg BID for now.  Weakness/debility: Physical therapy evaluated the patient and recommended skilled nursing facility on discharge.  Social worker was  consulted.  Multiple comorbidities/debility/poor prognosis: Palliative care was following.   Discharge Diagnoses:  Principal  Problem:   Drug-induced liver injury Active Problems:   Bipolar disorder (HCC)   Anxiety   S/P gastric bypass   Severe sepsis (HCC)   Acute encephalopathy   Hyperbilirubinemia   Fatty infiltration of liver   AKI (acute kidney injury) ([place])   Protein-calorie malnutrition, severe   Elevated LFTs   Hyperammonemia ([place])   Anasarca   HCAP (healthcare-associated pneumonia)   Colitis   Encephalopathy, hepatic (HCC)    Discharge Instructions   Allergies as of 06/09/2018      Reactions   Sulfasalazine Hives   Lamictal [lamotrigine] Nausea And Vomiting, Other (See Comments)   Tremors, diplopia   Penicillins Hives, Itching   As a child, "breathing, itching problem" Has patient had a PCN reaction causing immediate rash, facial/tongue/throat swelling, SOB or lightheadedness with hypotension: Yes Has patient had a PCN reaction causing severe rash involving mucus membranes or skin necrosis: Yes Has patient had a PCN reaction that required hospitalization No Has patient had a PCN reaction occurring within the last 10 years: No If all of the above answers are "NO", then may proceed with Cephalosporin use.   Sulfa Antibiotics Hives   Morphine And Related Itching, Rash      Medication List    STOP taking these medications   acetaminophen 500 MG tablet Commonly known as:  TYLENOL   albuterol (2.5 MG/3ML) 0.083% nebulizer solution Commonly known as:  PROVENTIL   ALPRAZolam 1 MG tablet Commonly known as:  XANAX   buPROPion 100 MG 12 hr tablet Commonly known as:  WELLBUTRIN SR   cetirizine 10 MG tablet Commonly known as:  ZYRTEC   cyanocobalamin 1000 MCG/ML injection Commonly known as:  (VITAMIN B-12)   ENBREL 50 MG/ML injection Generic drug:  etanercept   hydroxychloroquine 200 MG tablet Commonly known as:  PLAQUENIL   LATUDA 80 MG Tabs  tablet Generic drug:  lurasidone   levothyroxine 50 MCG tablet Commonly known as:  SYNTHROID, LEVOTHROID Replaced by:  levothyroxine 100 MCG Solr injection   lubiprostone 24 MCG capsule Commonly known as:  AMITIZA   naproxen 500 MG tablet Commonly known as:  NAPROSYN   omeprazole 20 MG capsule Commonly known as:  PRILOSEC   potassium chloride SA 20 MEQ tablet Commonly known as:  K-DUR,KLOR-CON   promethazine 25 MG tablet Commonly known as:  PHENERGAN   QUEtiapine 300 MG tablet Commonly known as:  SEROQUEL   sodium chloride 0.65 % Soln nasal spray Commonly known as:  OCEAN   topiramate 25 MG capsule Commonly known as:  TOPAMAX     TAKE these medications   ceFEPIme 2 g in sodium chloride 0.9 % 100 mL Inject 2 g into the vein every 8 (eight) hours.   famotidine 20-0.9 MG/50ML-% Commonly known as:  PEPCID Inject 50 mLs (20 mg total) into the vein every 12 (twelve) hours.   furosemide 10 MG/ML injection Commonly known as:  LASIX Inject 4 mLs (40 mg total) into the vein every 12 (twelve) hours.   lactulose 10 GM/15ML solution Commonly known as:  CHRONULAC Place 45 mLs (30 g total) into feeding tube every 6 (six) hours.   levothyroxine 100 MCG Solr injection Commonly known as:  SYNTHROID, LEVOTHROID Inject 1.25 mLs (25 mcg total) into the vein daily. Replaces:  levothyroxine 50 MCG tablet   ondansetron 4 MG/2ML Soln injection Commonly known as:  ZOFRAN Inject 2 mLs (4 mg total) into the vein every 6 (six) hours as needed for nausea.   rifaximin 550 MG  Tabs tablet Commonly known as:  XIFAXAN Take 1 tablet (550 mg total) by mouth 2 (two) times daily.   vancomycin 1-5 GM/200ML-% Soln Commonly known as:  VANCOCIN Inject 200 mLs (1,000 mg total) into the vein every 8 (eight) hours.       Allergies  Allergen Reactions  . Sulfasalazine Hives  . Lamictal [Lamotrigine] Nausea And Vomiting and Other (See Comments)    Tremors, diplopia  . Penicillins Hives and  Itching    As a child, "breathing, itching problem" Has patient had a PCN reaction causing immediate rash, facial/tongue/throat swelling, SOB or lightheadedness with hypotension: Yes Has patient had a PCN reaction causing severe rash involving mucus membranes or skin necrosis: Yes Has patient had a PCN reaction that required hospitalization No Has patient had a PCN reaction occurring within the last 10 years: No If all of the above answers are "NO", then may proceed with Cephalosporin use.   . Sulfa Antibiotics Hives  . Morphine And Related Itching and Rash    Consultations:  GI,PCCM   Procedures/Studies: Ct Abdomen Pelvis Wo Contrast  Result Date: 06/08/2018 CLINICAL DATA:  Ct a/p wo, diarrhea, Ischemia, abdomen pelvis EXAM: CT ABDOMEN AND PELVIS WITHOUT CONTRAST TECHNIQUE: Multidetector CT imaging of the abdomen and pelvis was performed following the standard protocol without IV contrast. COMPARISON:  CT of the abdomen and pelvis on 06/04/2018 FINDINGS: Lower chest: Ground-glass patchy opacities in the lung bases bilaterally, increased compared with previous exam. Heart size is normal. No pericardial effusion or significant coronary artery calcifications. Hepatobiliary: Marked low-attenuation of the liver without focal mass. Status post cholecystectomy. Pancreas: Unremarkable. No pancreatic ductal dilatation or surrounding inflammatory changes. Spleen: Normal in size without focal abnormality. Adrenals/Urinary Tract: Intrarenal calculi are identified bilaterally. No hydronephrosis, renal mass, or urinary tract obstruction. Urinary bladder is unremarkable. Foley catheter decompresses the bladder. Stomach/Bowel: Nasogastric tube in place to the level of stomach. There is a moderate hiatal hernia. Previous gastric bypass is unremarkable. Stomach is otherwise normal in appearance. There is a large amount of stool throughout nondilated loops of colon. There has been improvement in the appearance of  colonic wall edema. Mild edema persists. There is small amount of fluid in the paracolic gutters bilaterally. No abscess or perforation. The appendix is well seen and has a normal appearance. Vascular/Lymphatic: There is minimal atherosclerotic calcification of the abdominal aorta not associated with aneurysm. Reproductive: Hysterectomy.  No adnexal mass. Other: Body wall edema. Musculoskeletal: No acute or significant osseous findings. IMPRESSION: 1. There has been some improvement in colonic wall thickening. Mild, diffuse colonic wall thickening persists and is consistent with infectious or inflammatory colitis. 2. Significant body wall edema. 3. Increasing ground-glass opacities in the lung bases, consistent with infectious infiltrate. 4. Marked hepatic steatosis. 5. Cholecystectomy.  Hysterectomy. 6. Intrarenal calculi bilaterally.  No hydronephrosis. 7. Moderate hiatal hernia. 8.  Aortic atherosclerosis.  (ICD10-I70.0) Electronically Signed   By: Nolon Nations M.D.   On: 06/08/2018 19:04   Dg Chest 2 View  Result Date: 05/22/2018 CLINICAL DATA:  Bilateral leg swelling for 2 weeks, lower extremity bruising. Fatigue. EXAM: CHEST - 2 VIEW COMPARISON:  Chest x-ray dated 12/17/2016. FINDINGS: The heart size and mediastinal contours are within normal limits. Both lungs are clear. The visualized skeletal structures are unremarkable. Suspect small hiatal hernia. IMPRESSION: 1. No active cardiopulmonary disease. No evidence of pneumonia or pulmonary edema. 2. Suspect small hiatal hernia. Electronically Signed   By: Franki Cabot M.D.   On: 05/22/2018 15:28  Dg Tibia/fibula Right  Result Date: 05/22/2018 CLINICAL DATA:  Bilateral leg swelling for 2 weeks, RIGHT leg worse, bruising. EXAM: RIGHT TIBIA AND FIBULA - 2 VIEW COMPARISON:  None. FINDINGS: There is no evidence of fracture or other focal bone lesions. Soft tissues are unremarkable. IMPRESSION: Negative. Electronically Signed   By: Franki Cabot M.D.    On: 05/22/2018 15:24   Dg Ankle Complete Right  Result Date: 05/22/2018 CLINICAL DATA:  Bilateral leg swelling for 2 weeks, RIGHT greater than LEFT, bruising. EXAM: RIGHT ANKLE - COMPLETE 3+ VIEW COMPARISON:  None. FINDINGS: Osseous alignment is normal. Bone mineralization appears normal. No acute or suspicious osseous lesion. No fracture line or displaced fracture fragment. Ankle mortise is symmetric. No significant degenerative change. Spurring noted at the plantar surface of the posterior calcaneus. Presumed soft tissue swelling. IMPRESSION: 1. No acute or suspicious osseous finding. 2. Presumed soft tissue swelling/edema at the level of the ankle. Electronically Signed   By: Franki Cabot M.D.   On: 05/22/2018 15:26   Dg Abd 1 View  Result Date: 06/08/2018 CLINICAL DATA:  Check feeding catheter placement EXAM: ABDOMEN - 1 VIEW COMPARISON:  None. FINDINGS: 1 minutes 12 seconds of fluoroscopy was utilized for feeding catheter placement. Catheter is coiled within the gastric remnant. IMPRESSION: Feeding catheter within the gastric remnant Electronically Signed   By: Inez Catalina M.D.   On: 06/08/2018 16:42   Ct Head Wo Contrast  Result Date: 06/04/2018 CLINICAL DATA:  Altered mental status. Difficulty walking for 2 weeks with slurred speech. Possible medication reaction. EXAM: CT HEAD WITHOUT CONTRAST TECHNIQUE: Contiguous axial images were obtained from the base of the skull through the vertex without intravenous contrast. COMPARISON:  CT head 05/31/2018. FINDINGS: Brain: There is no evidence of acute intracranial hemorrhage, mass lesion, brain edema or extra-axial fluid collection. The ventricles and subarachnoid spaces are appropriately sized for age. There is no CT evidence of acute cortical infarction. Vascular:  No hyperdense vessel identified. Skull: Negative for fracture or focal lesion. Prominent scaphocephaly again noted. Sinuses/Orbits: The visualized paranasal sinuses and mastoid air cells are  clear. No orbital abnormalities are seen. Other: None. IMPRESSION: Stable head CT without acute intracranial findings. Electronically Signed   By: Richardean Sale M.D.   On: 06/04/2018 17:35   Ct Head Wo Contrast  Result Date: 05/31/2018 CLINICAL DATA:  Minor head trauma.  Fall. EXAM: CT HEAD WITHOUT CONTRAST TECHNIQUE: Contiguous axial images were obtained from the base of the skull through the vertex without intravenous contrast. COMPARISON:  MRI head 09/07/2017, CT 09/07/2017 FINDINGS: Brain: No evidence of acute infarction, hemorrhage, hydrocephalus, extra-axial collection or mass lesion/mass effect. Vascular: No hyperdense vessel or unexpected calcification. Skull: Negative for skull lesion.  Scaphocephaly. Sinuses/Orbits: Negative Other: None IMPRESSION: Negative CT head. Electronically Signed   By: Franchot Gallo M.D.   On: 05/31/2018 10:35   Ct Angio Chest Pe W And/or Wo Contrast  Result Date: 05/22/2018 CLINICAL DATA:  47 year old female with bilateral lower extremity edema and elevated D-dimer. Evaluate for pulmonary embolism. EXAM: CT ANGIOGRAPHY CHEST WITH CONTRAST TECHNIQUE: Multidetector CT imaging of the chest was performed using the standard protocol during bolus administration of intravenous contrast. Multiplanar CT image reconstructions and MIPs were obtained to evaluate the vascular anatomy. CONTRAST:  136mL ISOVUE-370 IOPAMIDOL (ISOVUE-370) INJECTION 76% COMPARISON:  Chest x-ray obtained today, 05/22/2018 FINDINGS: Cardiovascular: Adequate opacification of the pulmonary arteries to the segmental level. No evidence of central filling defect to suggest acute pulmonary embolus. The aorta is  normal in caliber. Conventional 3 vessel arch anatomy. The heart is normal in size. No pericardial effusion. Mediastinum/Nodes: No suspicious mediastinal or hilar adenopathy. The thyroid gland is normal in size. Moderate hiatal hernia with surgical changes of prior gastric bypass procedure. Lungs/Pleura:  Mild dependent atelectasis. No focal airspace consolidation, pleural effusion, pulmonary edema or pneumothorax. There is a 7 mm nodule in the right upper lobe just above the minor fissure (axial image 37, series 6; coronal image 47 series 7). Otherwise, the lungs are clear. Upper Abdomen: Significant hypoattenuation of the hepatic parenchyma consistent with advanced hepatic steatosis. The hepatic and portal veins are patent. No overt cirrhotic morphology or discrete lesion. Surgical changes of prior gastric bypass procedure, incompletely imaged. No acute abnormality in the visualized upper abdomen. Musculoskeletal: No acute fracture or aggressive appearing lytic or blastic osseous lesion. Review of the MIP images confirms the above findings. IMPRESSION: 1. Negative for acute pulmonary embolus, pneumonia or other acute cardiopulmonary process. 2. Nonspecific solitary 7 mm right upper lobe pulmonary nodule situated just superior to the minor fissure. Non-contrast chest CT at 6-12 months is recommended. If the nodule is stable at time of repeat CT, then future CT at 18-24 months (from today's scan) is considered optional for low-risk patients, but is recommended for high-risk patients. This recommendation follows the consensus statement: Guidelines for Management of Incidental Pulmonary Nodules Detected on CT Images: From the Fleischner Society 2017; Radiology 2017; 284:228-243. 3. Surgical changes of prior gastric bypass procedure. There is a moderate hiatal hernia. 4. Advanced hepatic steatosis. Electronically Signed   By: Jacqulynn Cadet M.D.   On: 05/22/2018 16:33   Mr Brain W And Wo Contrast  Result Date: 06/05/2018 CLINICAL DATA:  47 y/o F; progressive decline in mental status over 3-6 months with right-sided facial droop and slurred speech. Difficulty walking. History of RA, bipolar, generalized anxiety disorder, and hyperparathyroidism status post parathyroidectomy. EXAM: MRI HEAD WITHOUT AND WITH CONTRAST  TECHNIQUE: Multiplanar, multiecho pulse sequences of the brain and surrounding structures were obtained without and with intravenous contrast. CONTRAST:  29mL MULTIHANCE GADOBENATE DIMEGLUMINE 529 MG/ML IV SOLN COMPARISON:  09/07/2017 MRI head.  06/04/2018 CT head. FINDINGS: Brain: No acute infarction, hemorrhage, hydrocephalus, extra-axial collection or mass lesion. No significant structural or signal abnormality of the brain. After administration of intravenous contrast there is no abnormal enhancement. Vascular: Normal flow voids. Skull and upper cervical spine: Normal marrow signal. Sinuses/Orbits: Negative. Other: Stable scaphocephaly. IMPRESSION: No acute intracranial process or abnormal enhancement. Stable normal MRI of the brain. Electronically Signed   By: Kristine Garbe M.D.   On: 06/05/2018 05:32   Ct Abdomen Pelvis W Contrast  Result Date: 06/04/2018 CLINICAL DATA:  Painless jaundice, weight loss or fatigue of more than 3 months duration, slurred speech, unable to walk for 2 weeks, worsening symptoms, history asthma, fibromyalgia, gastric paresis, GERD, kidney stones, primary hyperparathyroidism, irritable bowel syndrome, rheumatoid arthritis, smoker EXAM: CT ABDOMEN AND PELVIS WITH CONTRAST TECHNIQUE: Multidetector CT imaging of the abdomen and pelvis was performed using the standard protocol following bolus administration of intravenous contrast. Sagittal and coronal MPR images reconstructed from axial data set. CONTRAST:  134mL ISOVUE-300 IOPAMIDOL (ISOVUE-300) INJECTION 61% IV. No oral contrast. COMPARISON:  11/05/2016 FINDINGS: Lower chest: Infiltrate and atelectasis in RIGHT middle lobe. Subsegmental atelectasis lingula Hepatobiliary: Marked fatty infiltration of liver. Gallbladder surgically absent. No biliary dilatation. Pancreas: Normal appearance Spleen: Normal appearance Adrenals/Urinary Tract: BILATERAL nonobstructing renal calculi. Adrenal glands, kidneys, ureters, and bladder  otherwise normal appearance. Small  Stomach/Bowel: Normal appendix. Prior gastric bypass surgery with hiatal hernia versus distension of reservoir in the inferior mediastinum. Fluid within the stented esophagus. Few minimally prominent small bowel loops with out evidence of obstruction. Small bowel loops otherwise unremarkable. Significant bowel wall thickening of cecum, ascending colon, and scattered sites at of the mid transverse colon, splenic flexure and descending colon compatible with colitis. Vascular/Lymphatic: Aorta normal caliber with minimal atherosclerotic calcification. No adenopathy. Scattered pelvic phleboliths. Reproductive: Uterus surgically absent. Nonvisualization of ovaries. Other: Low-attenuation free fluid in pelvis. No free air. No hernia. Musculoskeletal: Bones demineralized. IMPRESSION: Patchy areas of colonic wall thickening greatest at ascending colon and cecum consistent with colitis; differential diagnosis includes infection and inflammatory bowel disease, with ischemia considered less likely due to lack of significant vascular disease changes. Prior gastric bypass surgery with either hiatal hernia or distension of pouch and fluid within the distal esophagus. Marked fatty infiltration of liver. Small amount of free pelvic fluid. Electronically Signed   By: Lavonia Dana M.D.   On: 06/04/2018 17:45   US Venous Img Lower Unilateral Right  Result Date: 05/22/2018 CLINICAL DATA:  Swelling, bruising, oozing fluid, right worse than left EXAM: RIGHT LOWER EXTREMITY VENOUS DOPPLER ULTRASOUND TECHNIQUE: Gray-scale sonography with compression, as well as color and duplex ultrasound, were performed to evaluate the deep venous system from the level of the common femoral vein through the popliteal and proximal calf veins. COMPARISON:  None FINDINGS: Normal compressibility of the common femoral, superficial femoral, and popliteal veins, as well as the proximal calf veins. No filling defects to  suggest DVT on grayscale or color Doppler imaging. Doppler waveforms show normal direction of venous flow, normal respiratory phasicity and response to augmentation. Subcutaneous edema in the calf. Survey views of the contralateral common femoral vein are unremarkable. IMPRESSION: No evidence of right lower extremity deep vein thrombosis. Electronically Signed   By: Lucrezia Europe M.D.   On: 05/22/2018 16:12   Dg Chest Port 1 View  Result Date: 06/08/2018 CLINICAL DATA:  Now onset elevated white count Hx asthma, current smoker 1/4 ppd for 20 years EXAM: PORTABLE CHEST 1 VIEW COMPARISON:  Chest x-rays dated 06/04/2018 12/17/2016. FINDINGS: Study is hypoinspiratory. Heart size and mediastinal contours are within normal limits. The bilateral interstitial and alveolar opacities are increased compared to the previous exam, most dense/confluent within the periphery of the RIGHT upper lobe. No acute or suspicious osseous finding. IMPRESSION: 1. Worsening airspace opacities bilaterally, most dense/confluent within the periphery of the RIGHT upper lobe. Findings are compatible with pulmonary edema and/or pneumonia. 2. Low lung volumes. Electronically Signed   By: Franki Cabot M.D.   On: 06/08/2018 13:00   Dg Chest Portable 1 View  Result Date: 06/04/2018 CLINICAL DATA:  Hypotension, lethargy, weakness EXAM: PORTABLE CHEST 1 VIEW COMPARISON:  Portable exam 1554 hours compared to 05/22/2018 FINDINGS: Normal heart size mediastinal contours. BILATERAL perihilar interstitial infiltrates question edema versus infection. Minimal subsegmental atelectasis in LEFT upper lobe. Remaining lungs clear. No pleural effusion or pneumothorax. Bones demineralized. IMPRESSION: New perihilar infiltrates question edema versus infection. Electronically Signed   By: Lavonia Dana M.D.   On: 06/04/2018 16:17   US Abdomen Limited Ruq  Result Date: 06/04/2018 CLINICAL DATA:  Jaundice EXAM: ULTRASOUND ABDOMEN LIMITED RIGHT UPPER QUADRANT  COMPARISON:  None FINDINGS: Gallbladder: Gallbladder surgically absent Common bile duct: Diameter: A tubular structure measuring 17 mm in diameter is seen at the porta hepatis. No definite flow is seen within this structure on color Doppler imaging.  Based on accompanying CT exam done immediately prior, this does not represent the common bile duct, which measures only 4 mm diameter on CT. Likely this represents the portal vein which has very slow flow not demonstrated by ultrasound; portal vein was opacified by contrast and patent on CT, not thrombosed. Liver: Increased hepatic echogenicity consistent with the marked fatty infiltration seen on CT. No focal hepatic mass lesion. Portal vein is patent and opacified by CT, with flow detected in the LEFT portal vein on color Doppler imaging but not definitely within the main portal vein question slow flow. No RIGHT upper quadrant free fluid. IMPRESSION: Marked fatty infiltration of liver. Post cholecystectomy. Suspect slow flow within the portal vein, which is patent and enhancing on CT. Electronically Signed   By: Lavonia Dana M.D.   On: 06/04/2018 18:34       Subjective:   Discharge Exam: Vitals:   06/09/18 0015 06/09/18 0756  BP:    Pulse:    Resp:    Temp: 98.1 F (36.7 C) 98.6 F (37 C)  SpO2:     Vitals:   06/08/18 2348 06/09/18 0015 06/09/18 0500 06/09/18 0756  BP: 108/62     Pulse: 94     Resp:      Temp:  98.1 F (36.7 C)  98.6 F (37 C)  TempSrc:  Axillary  Axillary  SpO2: 92%     Weight:   103.3 kg   Height:        General: Pt is very lethargic today, not alert or oriented Cardiovascular: RRR, S1/S2 +, no rubs, no gallops Respiratory: Bilateral decreased air entry on the bases abdominal: Soft, distended, nontender, bowel sounds + Extremities: Anasarca, no cyanosis    The results of significant diagnostics from this hospitalization (including imaging, microbiology, ancillary and laboratory) are listed below for reference.      Microbiology: Recent Results (from the past 240 hour(s))  Blood Culture (routine x 2)     Status: Abnormal   Collection Time: 06/04/18  6:36 PM  Result Value Ref Range Status   Specimen Description   Final    BLOOD BLOOD RIGHT FOREARM Performed at [facility] [address] [place], [place] [address]    Special Requests   Final    BOTTLES DRAWN AEROBIC ONLY Blood Culture results may not be optimal due to an inadequate volume of blood received in culture bottles Performed at [facility] [address] [place], [place] [address]    Culture  Setup Time   Final    GRAM NEGATIVE RODS AEROBIC BOTTLE ONLY CRITICAL VALUE NOTED.  VALUE IS CONSISTENT WITH PREVIOUSLY REPORTED AND CALLED VALUE.    Culture (A)  Final    ESCHERICHIA COLI SUSCEPTIBILITIES PERFORMED ON PREVIOUS CULTURE WITHIN THE LAST 5 DAYS. Performed at [facility], [place] [address] [place] [address], [place], [place] [address]    Report Status 06/07/2018 FINAL  Final  Blood Culture (routine x 2)     Status: Abnormal (Preliminary result)   Collection Time: 06/04/18  6:36 PM  Result Value Ref Range Status   Specimen Description   Final    BLOOD BLOOD RIGHT HAND Performed at [facility] [address], [place],  [address]    Special Requests   Final    BOTTLES DRAWN AEROBIC ONLY Blood Culture results may not be optimal due to an inadequate volume of blood received in culture bottles Performed at [facility]  [address], [place], [place] [address]    Culture  Setup Time   Final    GRAM NEGATIVE RODS AEROBIC BOTTLE ONLY CRITICAL RESULT CALLED TO, READ BACK BY AND VERIFIED WITH: Jene Every PharmD 12:25 06/05/18 ([email or web address])    Culture (A)  Final    ESCHERICHIA COLI Sent to [facility] for further susceptibility testing. Performed at [facility], [place] [address] [place], [place] [address]    Report Status PENDING  Incomplete   Organism ID,  Bacteria ESCHERICHIA COLI  Final      Susceptibility   Escherichia coli - MIC*    AMPICILLIN >=32 RESISTANT Resistant     CEFAZOLIN <=4 SENSITIVE Sensitive     CEFEPIME <=1 SENSITIVE Sensitive     CEFTAZIDIME <=1 SENSITIVE Sensitive     CEFTRIAXONE <=1 SENSITIVE Sensitive     CIPROFLOXACIN <=0.25 SENSITIVE Sensitive     GENTAMICIN <=1 SENSITIVE Sensitive     IMIPENEM <=0.25 SENSITIVE Sensitive     TRIMETH/SULFA >=320 RESISTANT Resistant     AMPICILLIN/SULBACTAM 16 INTERMEDIATE Intermediate     PIP/TAZO <=4 SENSITIVE Sensitive     Extended ESBL NEGATIVE Sensitive     * ESCHERICHIA COLI  Blood Culture ID Panel (Reflexed)     Status: Abnormal   Collection Time: 06/04/18  6:36 PM  Result Value Ref Range Status   Enterococcus species NOT DETECTED NOT DETECTED Final   Listeria monocytogenes NOT DETECTED NOT DETECTED Final   Staphylococcus species NOT DETECTED NOT DETECTED Final   Staphylococcus aureus NOT DETECTED NOT DETECTED Final   Streptococcus species NOT DETECTED NOT DETECTED Final   Streptococcus agalactiae NOT DETECTED NOT DETECTED Final   Streptococcus pneumoniae NOT DETECTED NOT DETECTED Final   Streptococcus pyogenes NOT DETECTED NOT DETECTED Final   Acinetobacter baumannii NOT DETECTED NOT DETECTED Final   Enterobacteriaceae species DETECTED (A) NOT DETECTED Final    Comment: Enterobacteriaceae represent a large family of gram-negative bacteria, not a single organism. CRITICAL RESULT CALLED TO, READ BACK BY AND VERIFIED WITH: Jene Every PharmD 12:25 06/05/18 ([email or web address])    Enterobacter cloacae complex NOT DETECTED NOT DETECTED Final   Escherichia coli DETECTED (A) NOT DETECTED Final    Comment: CRITICAL RESULT CALLED TO, READ BACK BY AND VERIFIED WITH: Jene Every PharmD 12:25 06/05/18 ([email or web address])    Klebsiella oxytoca NOT DETECTED NOT DETECTED Final   Klebsiella pneumoniae NOT DETECTED NOT DETECTED Final   Proteus species NOT DETECTED NOT DETECTED Final   Serratia marcescens NOT  DETECTED NOT DETECTED Final   Carbapenem resistance NOT DETECTED NOT DETECTED Final   Haemophilus influenzae NOT DETECTED NOT DETECTED Final   Neisseria meningitidis NOT DETECTED NOT DETECTED Final   Pseudomonas aeruginosa NOT DETECTED NOT DETECTED Final   Candida albicans NOT DETECTED NOT DETECTED Final   Candida glabrata NOT DETECTED NOT DETECTED Final   Candida krusei NOT DETECTED NOT DETECTED Final   Candida parapsilosis NOT DETECTED NOT DETECTED Final   Candida tropicalis NOT DETECTED NOT DETECTED Final    Comment: Performed at [facility], [place] [address] [place], [place] [address]  MRSA PCR Screening     Status: None   Collection Time: 06/05/18  3:34 AM  Result Value Ref Range Status   MRSA by PCR NEGATIVE NEGATIVE Final    Comment:        The GeneXpert MRSA Assay (FDA approved for NASAL specimens only), is one component of a comprehensive MRSA colonization surveillance program. It is not intended to diagnose MRSA infection nor  to guide or monitor treatment for MRSA infections. Performed at [facility], [place] [address] [place], [place] [address]      Labs: BNP (last 3 results) No results for input(s): BNP in the last 8760 hours. Basic Metabolic Panel: Recent Labs  Lab 06/05/18 1219 06/06/18 0650 06/07/18 0424 06/08/18 0536 06/09/18 0556  NA 133* 134* 134* 136 136  K 4.3 4.1 3.4* 3.1* 3.4*  CL 103 103 103 104 103  CO2 19* 20* 20* 22 22  GLUCOSE 75 81 81 89 111*  BUN 19 18 13 11 15   CREATININE 1.46* 1.17* 0.72 0.62 0.69  CALCIUM 7.4* 7.7* 7.6* 7.6* 7.8*   Liver Function Tests: Recent Labs  Lab 06/05/18 1219 06/06/18 1438 06/07/18 0424 06/08/18 0536 06/09/18 0556  AST 61* 70* 87* 106* 165*  ALT 63* 59* 63* 65* 83*  ALKPHOS 358* 399* 490* 554* 671*  BILITOT 12.2* 14.0* 15.3* 15.9* 18.3*  PROT 3.5* 3.6* 3.8* 3.5* 3.7*  ALBUMIN 1.7* 1.6* 1.6* 1.4* 1.4*   Recent Labs  Lab 06/04/18 1618  LIPASE 20   Recent Labs  Lab 06/04/18 1618  06/08/18 0916 06/09/18 0556  AMMONIA 28 177* 126*   CBC: Recent Labs  Lab 06/04/18 1618 06/04/18 1646 06/06/18 0650 06/07/18 0424 06/08/18 0536 06/09/18 0556  WBC 13.4*  --  11.8* 16.2* 18.3* 22.4*  NEUTROABS 11.9*  --  9.9* 13.5* 14.0* 19.3*  HGB 12.8 12.2 9.5* 10.0* 9.5* 9.4*  HCT 38.2 36.0 28.6* 29.7* 28.5* 28.3*  MCV 93.9  --  93.8 93.1 91.9 92.8  PLT 158  158  --  PLATELET CLUMPS NOTED ON SMEAR, COUNT APPEARS DECREASED 89* 80* 104*   Cardiac Enzymes: Recent Labs  Lab 06/04/18 1618 06/04/18 2258  TROPONINI 0.04* 0.04*   BNP: Invalid input(s): POCBNP CBG: Recent Labs  Lab 06/08/18 0623 06/08/18 1203 06/08/18 1850 06/09/18 0014 06/09/18 0755  GLUCAP 88 101* 110* 112* 114*   D-Dimer No results for input(s): DDIMER in the last 72 hours. Hgb A1c No results for input(s): HGBA1C in the last 72 hours. Lipid Profile No results for input(s): CHOL, HDL, LDLCALC, TRIG, CHOLHDL, LDLDIRECT in the last 72 hours. Thyroid function studies No results for input(s): TSH, T4TOTAL, T3FREE, THYROIDAB in the last 72 hours.  Invalid input(s): FREET3 Anemia work up No results for input(s): VITAMINB12, FOLATE, FERRITIN, TIBC, IRON, RETICCTPCT in the last 72 hours. Urinalysis    Component Value Date/Time   COLORURINE BROWN (A) 06/04/2018 1836   APPEARANCEUR CLOUDY (A) 06/04/2018 1836   LABSPEC  06/04/2018 1836    TEST NOT REPORTED DUE TO COLOR INTERFERENCE OF URINE PIGMENT   PHURINE  06/04/2018 1836    TEST NOT REPORTED DUE TO COLOR INTERFERENCE OF URINE PIGMENT   GLUCOSEU (A) 06/04/2018 1836    TEST NOT REPORTED DUE TO COLOR INTERFERENCE OF URINE PIGMENT   HGBUR (A) 06/04/2018 1836    TEST NOT REPORTED DUE TO COLOR INTERFERENCE OF URINE PIGMENT   BILIRUBINUR (A) 06/04/2018 1836    TEST NOT REPORTED DUE TO COLOR INTERFERENCE OF URINE PIGMENT   KETONESUR (A) 06/04/2018 1836    TEST NOT REPORTED DUE TO COLOR INTERFERENCE OF URINE PIGMENT   PROTEINUR (A) 06/04/2018 1836     TEST NOT REPORTED DUE TO COLOR INTERFERENCE OF URINE PIGMENT   NITRITE (A) 06/04/2018 1836    TEST NOT REPORTED DUE TO COLOR INTERFERENCE OF URINE PIGMENT   LEUKOCYTESUR (A) 06/04/2018 1836    TEST NOT REPORTED DUE TO  COLOR INTERFERENCE OF URINE PIGMENT   Sepsis Labs Invalid input(s): PROCALCITONIN,  WBC,  LACTICIDVEN Microbiology Recent Results (from the past 240 hour(s))  Blood Culture (routine x 2)     Status: Abnormal   Collection Time: 06/04/18  6:36 PM  Result Value Ref Range Status   Specimen Description   Final    BLOOD BLOOD RIGHT FOREARM Performed at [facility], [place] [address], [place], [place] [address]    Special Requests   Final    BOTTLES DRAWN AEROBIC ONLY Blood Culture results may not be optimal due to an inadequate volume of blood received in culture bottles Performed at [facility] [address], [place], [place] [address]    Culture  Setup Time   Final    GRAM NEGATIVE RODS AEROBIC BOTTLE ONLY CRITICAL VALUE NOTED.  VALUE IS CONSISTENT WITH PREVIOUSLY REPORTED AND CALLED VALUE.    Culture (A)  Final    ESCHERICHIA COLI SUSCEPTIBILITIES PERFORMED ON PREVIOUS CULTURE WITHIN THE LAST 5 DAYS. Performed at [facility], [place] [address] [place], [place] [address]    Report Status 06/07/2018 FINAL  Final  Blood Culture (routine x 2)     Status: Abnormal (Preliminary result)   Collection Time: 06/04/18  6:36 PM  Result Value Ref Range Status   Specimen Description   Final    BLOOD BLOOD RIGHT HAND Performed at [facility] [address], [place], [place] [address]    Special Requests   Final    BOTTLES DRAWN AEROBIC ONLY Blood Culture results may not be optimal due to an inadequate volume of blood received in culture bottles Performed at [facility] [address] [place], [place] [address]    Culture  Setup Time   Final    GRAM NEGATIVE RODS AEROBIC BOTTLE  ONLY CRITICAL RESULT CALLED TO, READ BACK BY AND VERIFIED WITH: Jene Every PharmD 12:25 06/05/18 ([email or web address])    Culture (A)  Final    ESCHERICHIA COLI Sent to [facility] for further susceptibility testing. Performed at [facility], [place] [address], [place], [place] [address]    Report Status PENDING  Incomplete   Organism ID, Bacteria ESCHERICHIA COLI  Final      Susceptibility   Escherichia coli - MIC*    AMPICILLIN >=32 RESISTANT Resistant     CEFAZOLIN <=4 SENSITIVE Sensitive     CEFEPIME <=1 SENSITIVE Sensitive     CEFTAZIDIME <=1 SENSITIVE Sensitive     CEFTRIAXONE <=1 SENSITIVE Sensitive     CIPROFLOXACIN <=0.25 SENSITIVE Sensitive     GENTAMICIN <=1 SENSITIVE Sensitive     IMIPENEM <=0.25 SENSITIVE Sensitive     TRIMETH/SULFA >=320 RESISTANT Resistant     AMPICILLIN/SULBACTAM 16 INTERMEDIATE Intermediate     PIP/TAZO <=4 SENSITIVE Sensitive     Extended ESBL NEGATIVE Sensitive     * ESCHERICHIA COLI  Blood Culture ID Panel (Reflexed)     Status: Abnormal   Collection Time: 06/04/18  6:36 PM  Result Value Ref Range Status   Enterococcus species NOT DETECTED NOT DETECTED Final   Listeria monocytogenes NOT DETECTED NOT DETECTED Final   Staphylococcus species NOT DETECTED NOT DETECTED Final   Staphylococcus aureus NOT DETECTED NOT DETECTED Final   Streptococcus species NOT DETECTED NOT DETECTED Final   Streptococcus agalactiae NOT DETECTED NOT DETECTED Final   Streptococcus pneumoniae NOT DETECTED NOT DETECTED Final   Streptococcus pyogenes NOT DETECTED NOT DETECTED Final   Acinetobacter  baumannii NOT DETECTED NOT DETECTED Final   Enterobacteriaceae species DETECTED (A) NOT DETECTED Final    Comment: Enterobacteriaceae represent a large family of gram-negative bacteria, not a single organism. CRITICAL RESULT CALLED TO, READ BACK BY AND VERIFIED WITH: Jene Every PharmD 12:25 06/05/18 ([email or web address])    Enterobacter cloacae complex NOT DETECTED NOT DETECTED Final   Escherichia coli  DETECTED (A) NOT DETECTED Final    Comment: CRITICAL RESULT CALLED TO, READ BACK BY AND VERIFIED WITH: Jene Every PharmD 12:25 06/05/18 ([email or web address])    Klebsiella oxytoca NOT DETECTED NOT DETECTED Final   Klebsiella pneumoniae NOT DETECTED NOT DETECTED Final   Proteus species NOT DETECTED NOT DETECTED Final   Serratia marcescens NOT DETECTED NOT DETECTED Final   Carbapenem resistance NOT DETECTED NOT DETECTED Final   Haemophilus influenzae NOT DETECTED NOT DETECTED Final   Neisseria meningitidis NOT DETECTED NOT DETECTED Final   Pseudomonas aeruginosa NOT DETECTED NOT DETECTED Final   Candida albicans NOT DETECTED NOT DETECTED Final   Candida glabrata NOT DETECTED NOT DETECTED Final   Candida krusei NOT DETECTED NOT DETECTED Final   Candida parapsilosis NOT DETECTED NOT DETECTED Final   Candida tropicalis NOT DETECTED NOT DETECTED Final    Comment: Performed at [facility], [place] [address] [place], [place] [address]  MRSA PCR Screening     Status: None   Collection Time: 06/05/18  3:34 AM  Result Value Ref Range Status   MRSA by PCR NEGATIVE NEGATIVE Final    Comment:        The GeneXpert MRSA Assay (FDA approved for NASAL specimens only), is one component of a comprehensive MRSA colonization surveillance program. It is not intended to diagnose MRSA infection nor to guide or monitor treatment for MRSA infections. Performed at [facility], [place] [address] [place], [place] [address]     Please note: You were cared for by a hospitalist during your hospital stay. Once you are discharged, your primary care physician will handle any further medical issues. Please note that NO REFILLS for any discharge medications will be authorized once you are discharged, as it is imperative that you return to your primary care physician (or establish a relationship with a primary care physician if you do not have one) for your post hospital discharge needs so that they can reassess your need  for medications and monitor your lab values.    Time coordinating discharge: 40 minutes  SIGNED:   Shelly Coss, MD  [facility] 06/09/2018, 10:55 AM Pager [phone]  If 7PM-7AM, please contact night-coverage [email or web address] Password [email or web address]

## 2018-06-09 NOTE — Progress Notes (Signed)
Patient transported via [facility] transport services.

## 2018-06-09 NOTE — Progress Notes (Signed)
[facility] Progress Note  Jordan Russell 47 y.o. February 28, 1971   Subjective: Somnolent. Husband at bedside.  Objective: Vital signs: Vitals:   06/09/18 0756 06/09/18 0840  BP:  120/74  Pulse:  90  Resp:  (!) 24  Temp: 98.6 F (37 C)   SpO2:  94%    Physical Exam: Gen: somnolent, jaundice HEENT: +icteric sclera CV: RRR Chest: CTA B Abd: distended, diffuse tenderness (facial grimace) with guarding, decreased bowel sounds  Lab Results: Recent Labs    06/08/18 0536 06/09/18 0556  NA 136 136  K 3.1* 3.4*  CL 104 103  CO2 22 22  GLUCOSE 89 111*  BUN 11 15  CREATININE 0.62 0.69  CALCIUM 7.6* 7.8*   Recent Labs    06/08/18 0536 06/09/18 0556  AST 106* 165*  ALT 65* 83*  ALKPHOS 554* 671*  BILITOT 15.9* 18.3*  PROT 3.5* 3.7*  ALBUMIN 1.4* 1.4*   Recent Labs    06/08/18 0536 06/09/18 0556  WBC 18.3* 22.4*  NEUTROABS 14.0* 19.3*  HGB 9.5* 9.4*  HCT 28.5* 28.3*  MCV 91.9 92.8  PLT 80* 104*      Assessment/Plan: Worsening cholestatic jaundice with source not known but likely medication-induced. Despite stable INR of 1.25 her worsening mental status and jaundice is concerning and agree with tertiary care center transfer for further management. Spoke with Dr. Manuella Ghazi at [facility] who accepted her for transfer and Dr. Tawanna Solo will facilitate transfer. Husband aware of plan.   [place] C. 06/09/2018, 12:22 PM  Questions please call [phone] ID: [name], female   DOB: 04/28/1971, 47 y.o.   MRN: [id number]

## 2018-06-09 NOTE — Progress Notes (Signed)
Pharmacy Antibiotic Note  Jordan Russell is a 47 y.o. female admitted on 06/04/2018 with AMS.  Pharmacy has been consulted for vancomycin and cefepime dosing for PNA; also with E coli bacteremia thought to be secondary to colitis. Renal function is stable. WBC trending up, platelets are low.  Plan: Vancomycin 2000 mg IV load then 1000 mg IV q8h, trough goal 15-20 mcg/ml Cefepime 2 g IV q8h Monitor renal function, clinical progress, C/S, de-escalate as able Vancomycin trough at steady-state  Height: 5\' 7"  (170.2 cm) Weight: 227 lb 11.8 oz (103.3 kg) IBW/kg (Calculated) : 61.6  Temp (24hrs), Avg:98.9 F (37.2 C), Min:98.1 F (36.7 C), Max:99.9 F (37.7 C)  Recent Labs  Lab 06/04/18 1618 06/04/18 1631  06/04/18 1833 06/04/18 2258 06/05/18 1219 06/06/18 0650 06/07/18 0424 06/08/18 0536 06/09/18 0556  WBC 13.4*  --   --   --   --   --  11.8* 16.2* 18.3* 22.4*  CREATININE 1.25*  --    < >  --   --  1.46* 1.17* 0.72 0.62 0.69  LATICACIDVEN  --  6.17*  --  6.23* 5.1*  --   --   --   --   --    < > = values in this interval not displayed.    Estimated Creatinine Clearance: 106.3 mL/min (by C-G formula based on SCr of 0.69 mg/dL).    Allergies  Allergen Reactions  . Sulfasalazine Hives  . Lamictal [Lamotrigine] Nausea And Vomiting and Other (See Comments)    Tremors, diplopia  . Penicillins Hives and Itching    As a child, "breathing, itching problem" Has patient had a PCN reaction causing immediate rash, facial/tongue/throat swelling, SOB or lightheadedness with hypotension: Yes Has patient had a PCN reaction causing severe rash involving mucus membranes or skin necrosis: Yes Has patient had a PCN reaction that required hospitalization No Has patient had a PCN reaction occurring within the last 10 years: No If all of the above answers are "NO", then may proceed with Cephalosporin use.   . Sulfa Antibiotics Hives  . Morphine And Related Itching and Rash     Antimicrobials this admission: Vancomycin 8/4 x1; 8/9 >> Levaquin 8/4 x1 Aztreonam 8/4 >> 8/7 Cipro 8/8 >> 8/9 Cefepime 8/9 >>  Dose adjustments this admission:   Microbiology results: 8/4 BCx: E Coli (R-amp, Bactrim; I-Unasyn; S-all others) 8/8 BCx: pending  Thank you for allowing pharmacy to be a part of this patient's care.  Renold Genta, PharmD, BCPS Clinical Pharmacist Clinical phone for 06/09/2018 until 3p is [phone] Please check AMION for all Pharmacist numbers by unit 06/09/2018 10:07 AM

## 2018-06-10 DIAGNOSIS — A4151 Sepsis due to Escherichia coli [E. coli]: Secondary | ICD-10-CM | POA: Insufficient documentation

## 2018-06-10 DIAGNOSIS — R17 Unspecified jaundice: Secondary | ICD-10-CM | POA: Insufficient documentation

## 2018-06-10 DIAGNOSIS — K529 Noninfective gastroenteritis and colitis, unspecified: Secondary | ICD-10-CM

## 2018-06-10 DIAGNOSIS — E8809 Other disorders of plasma-protein metabolism, not elsewhere classified: Secondary | ICD-10-CM

## 2018-06-10 HISTORY — DX: Other disorders of plasma-protein metabolism, not elsewhere classified: E88.09

## 2018-06-10 HISTORY — DX: Unspecified jaundice: R17

## 2018-06-10 HISTORY — DX: Noninfective gastroenteritis and colitis, unspecified: K52.9

## 2018-06-11 DIAGNOSIS — F05 Delirium due to known physiological condition: Secondary | ICD-10-CM

## 2018-06-11 HISTORY — DX: Delirium due to known physiological condition: F05

## 2018-06-13 LAB — CULTURE, BLOOD (ROUTINE X 2)
Culture: NO GROWTH
Culture: NO GROWTH
Special Requests: ADEQUATE

## 2018-06-14 LAB — MINIMUM INHIBITORY CONC. (1 DRUG)

## 2018-06-14 LAB — MIC RESULT

## 2018-06-19 LAB — CULTURE, BLOOD (ROUTINE X 2)

## 2018-06-26 LAB — METHYLMALONIC ACID(MMA), RND URINE
CREATININE(CRT), U: 0.13 g/L — AB (ref 0.30–3.00)
METHYLMALONIC ACID UR: 0.5 umol/L — AB (ref 1.6–29.7)
MMA - NORMALIZED: 0.4 umol/mmol{creat} — AB (ref 0.5–3.4)

## 2018-06-27 ENCOUNTER — Telehealth: Payer: Self-pay

## 2018-06-27 NOTE — Telephone Encounter (Signed)
I saw her for ECT in 2018.  I have returned the call to Dr. Sherlean Foot and left a voicemail with my personal phone number

## 2018-06-27 NOTE — Telephone Encounter (Signed)
dr. Melburn Hake nurse called states that dr. Melburn Hake wanted to speak with you about this patient in regards to some disability paperwork,

## 2018-08-26 IMAGING — MR MR HEAD WO/W CM
11 of 17 series · 30 of 48 positions shown · IV contrast (multihance)
Comparison: 09/07/2017 MRI head.  06/04/2018 CT head.

CLINICAL DATA: 47 y/o F; progressive decline in mental status over
3-6 months with right-sided facial droop and slurred speech.
Difficulty walking. History of RA, bipolar, generalized anxiety
disorder, and hyperparathyroidism status post parathyroidectomy.

EXAM:
MRI HEAD WITHOUT AND WITH CONTRAST
TECHNIQUE: Multiplanar, multiecho pulse sequences of the brain and surrounding
structures were obtained without and with intravenous contrast.
CONTRAST:  20mL MULTIHANCE GADOBENATE DIMEGLUMINE 529 MG/ML IV SOLN

[Series 5: ax dwi_tracew · axial · 3.0mm · 1.50mm/px · z∈[-63,+82]mm · 5 of 100 slices shown]
[im 1/100]
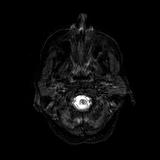
[im 25/100]
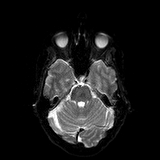
[im 50/100]
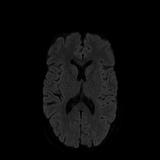
[im 75/100]
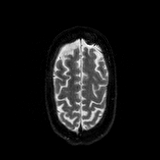
[im 100/100]
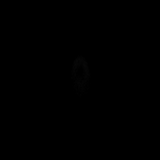

[Series 6: ax dwi_adc · axial · 3.0mm · 1.50mm/px · z∈[-63,+82]mm · 2 of 50 slices shown]
[im 1/50]
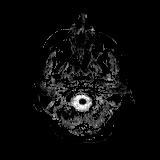
[im 50/50]
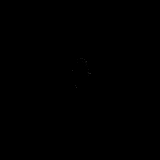

[Series 7: cor dwi_tracew · coronal · 5.0mm · 1.44mm/px · 5 of 76 slices shown]
[im 1/76]
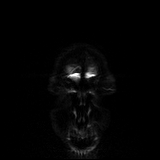
[im 19/76]
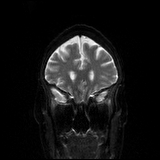
[im 38/76]
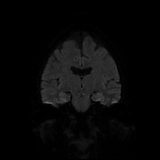
[im 57/76]
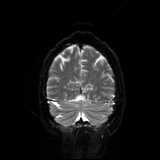
[im 76/76]
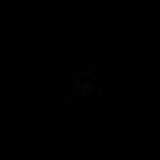

[Series 8: cor dwi_adc · coronal · 5.0mm · 1.44mm/px · 2 of 37 slices shown]
[im 1/37]
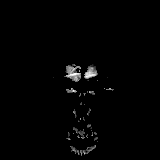
[im 37/37]
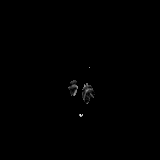

[Series 9: T1 · sagittal · 5.0mm · 0.75mm/px · 1 of 23 slices shown]
[im 1/23]
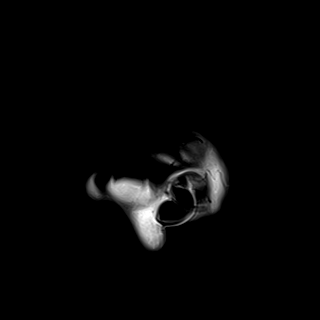

[Series 10: T2 · axial · 5.0mm · 0.72mm/px · z∈[-69,+85]mm · 2 of 27 slices shown]
[im 1/27]
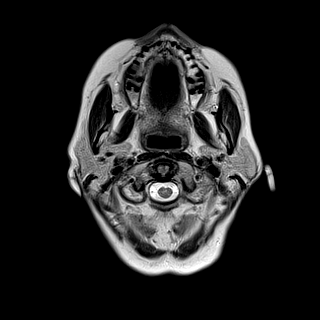
[im 27/27]
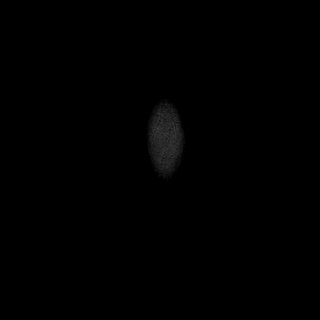

[Series 11: FLAIR · axial · 5.0mm · 0.45mm/px · z∈[-70,+84]mm · 2 of 27 slices shown]
[im 1/27]
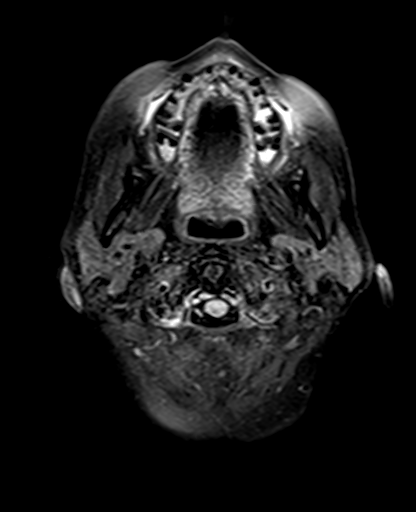
[im 27/27]
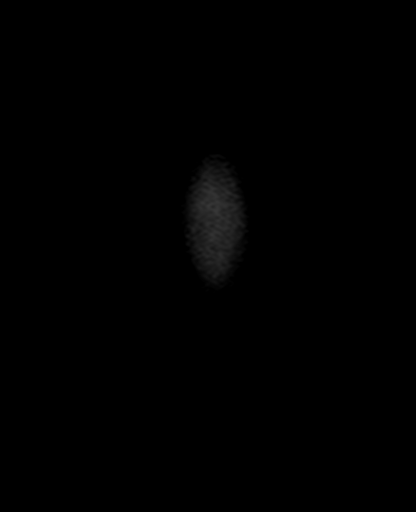

[Series 12: swi_images · axial · 3.0mm · 0.90mm/px · z∈[-81,+94]mm · 4 of 60 slices shown]
[im 1/60]
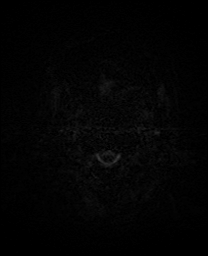
[im 20/60]
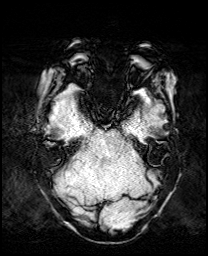
[im 40/60]
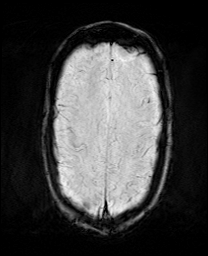
[im 60/60]
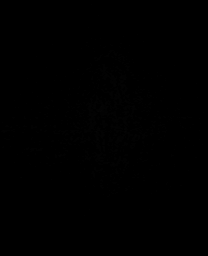

[Series 13: mip_images(sw) · axial · 24.0mm · 0.90mm/px · z∈[-70,+84]mm · 3 of 53 slices shown]
[im 1/53]
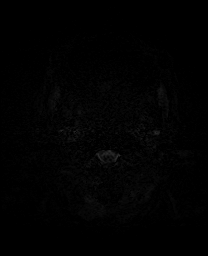
[im 27/53]
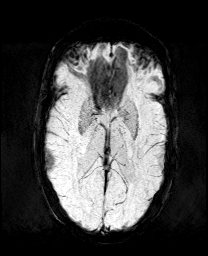
[im 53/53]
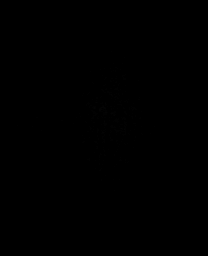

[Series 15: T2 post-contrast · coronal · 5.0mm · 0.72mm/px · 2 of 32 slices shown]
[im 1/32]
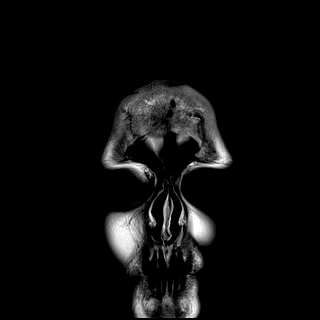
[im 32/32]
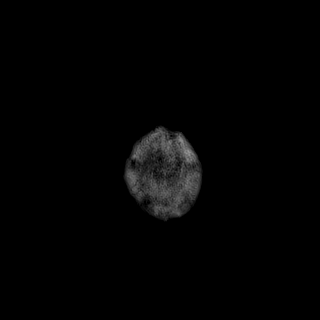

[Series 17: T1 post-contrast · coronal · 5.0mm · 0.34mm/px · 2 of 32 slices shown]
[im 1/32]
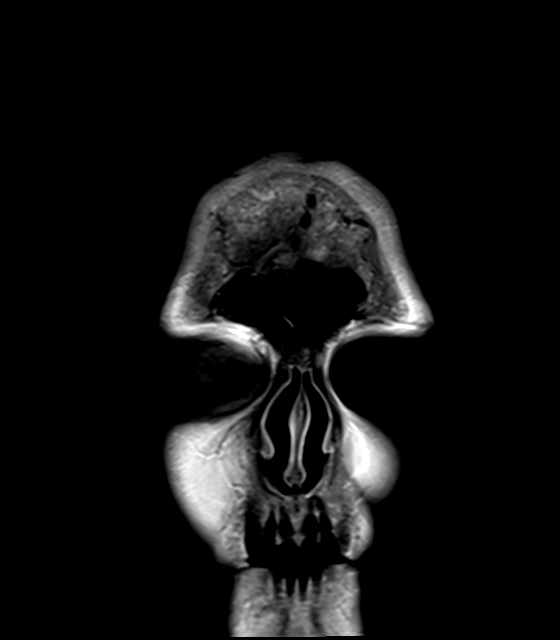
[im 32/32]
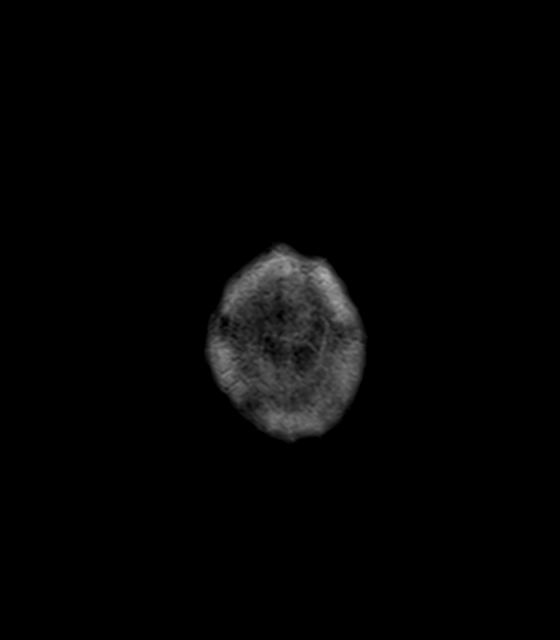

[30 of 48 positions shown; findings below may reference images not displayed]

FINDINGS: Brain: No acute infarction, hemorrhage, hydrocephalus, extra-axial
collection or mass lesion. No significant structural or signal
abnormality of the brain. After administration of intravenous
contrast there is no abnormal enhancement.

Vascular: Normal flow voids.

Skull and upper cervical spine: Normal marrow signal.

Sinuses/Orbits: Negative.

Other: Stable scaphocephaly.
IMPRESSION: No acute intracranial process or abnormal enhancement. Stable normal
MRI of the brain.

By: Keyraddiin Gidey M.D.

## 2018-09-04 ENCOUNTER — Ambulatory Visit: Payer: Managed Care, Other (non HMO) | Admitting: Neurology

## 2018-09-26 ENCOUNTER — Encounter: Payer: Self-pay | Admitting: Neurology

## 2018-11-22 DIAGNOSIS — R112 Nausea with vomiting, unspecified: Secondary | ICD-10-CM | POA: Diagnosis not present

## 2018-11-22 DIAGNOSIS — R251 Tremor, unspecified: Secondary | ICD-10-CM | POA: Diagnosis not present

## 2018-11-22 DIAGNOSIS — Z8719 Personal history of other diseases of the digestive system: Secondary | ICD-10-CM | POA: Diagnosis not present

## 2018-11-27 DIAGNOSIS — Z111 Encounter for screening for respiratory tuberculosis: Secondary | ICD-10-CM | POA: Diagnosis not present

## 2018-11-28 DIAGNOSIS — Z1231 Encounter for screening mammogram for malignant neoplasm of breast: Secondary | ICD-10-CM | POA: Diagnosis not present

## 2018-11-28 DIAGNOSIS — Z6831 Body mass index (BMI) 31.0-31.9, adult: Secondary | ICD-10-CM | POA: Diagnosis not present

## 2018-11-28 DIAGNOSIS — Z01419 Encounter for gynecological examination (general) (routine) without abnormal findings: Secondary | ICD-10-CM | POA: Diagnosis not present

## 2018-11-29 DIAGNOSIS — Z111 Encounter for screening for respiratory tuberculosis: Secondary | ICD-10-CM | POA: Diagnosis not present

## 2018-12-11 ENCOUNTER — Encounter: Payer: Self-pay | Admitting: Neurology

## 2018-12-11 ENCOUNTER — Other Ambulatory Visit (INDEPENDENT_AMBULATORY_CARE_PROVIDER_SITE_OTHER): Payer: BLUE CROSS/BLUE SHIELD

## 2018-12-11 ENCOUNTER — Encounter

## 2018-12-11 ENCOUNTER — Ambulatory Visit (INDEPENDENT_AMBULATORY_CARE_PROVIDER_SITE_OTHER): Payer: BLUE CROSS/BLUE SHIELD | Admitting: Neurology

## 2018-12-11 VITALS — BP 110/70 | HR 79 | Ht 67.0 in | Wt 193.5 lb

## 2018-12-11 DIAGNOSIS — E43 Unspecified severe protein-calorie malnutrition: Secondary | ICD-10-CM

## 2018-12-11 DIAGNOSIS — G5701 Lesion of sciatic nerve, right lower limb: Secondary | ICD-10-CM | POA: Diagnosis not present

## 2018-12-11 DIAGNOSIS — G5711 Meralgia paresthetica, right lower limb: Secondary | ICD-10-CM | POA: Diagnosis not present

## 2018-12-11 LAB — FOLATE: FOLATE: 19.4 ng/mL (ref >5.9)

## 2018-12-11 LAB — VITAMIN B12: VITAMIN B 12: 297 pg/mL (ref 211–911)

## 2018-12-11 NOTE — Progress Notes (Signed)
[facility] Clinic Note - Initial Visit   Date: 12/11/18  Jordan Russell MRN: [id number] DOB: 1971/06/26   Dear Dr. Delfina Redwood:  Thank you for your kind referral of Jordan Russell for consultation of right foot drop. Although her history is well known to you, please allow [place] to reiterate it for the purpose of our medical record. The patient was accompanied to the clinic by husband who also provides collateral information.     History of Present Illness: Jordan Russell is a 48 y.o. right-handed Caucasian female with hypothyroidism, GERD, hypertension, bipolar depression, hyperparathyroidism, fibromyalgia, rheumatoid arthritis, irritable bowel syndrome, history of gastric bypass with malnutrition and copper deficiency presenting for evaluation of right foot drop.     In August 2019, she was hospitalized for sepsis, hepatic encephalopathy, and drug-induced liver injury (plaquenil/eternacept) from 8/4 - 06/09/2018 at [facility] and transferred to [facility] where she stayed 8/9 - 06/23/2018.  Hospitalization was notable for healthcare associated pneumonia, E.coli sepsis, delirium, and liver injury.  She was transferred to rehab facility and was noted to have severe generalized weakness, diagnosed with critical illness myopathy.  She completed PT and slowly improved where she was able to stand and able to walk with a walker.  During this time, she noticed that her right foot was weak, specifically, difficulty raising her right foot.  She does not have similar weakness on the left foot.  She has numbness of the right great toe and middle toe.  She has numbness over the right lateral thigh for many years.  She has chronic low back pain, which is localized. She does not have radicular pain of the legs. She admits to crossing her legs.  She had NCS/EMG of the right leg (results not available to review).    She has been using a walker since Spring 2019 because of generalized  weakness and falls.  She underwent gastric bypass surgery in 2001 and lost 150lb.  She admits to having very poor appetite and continues to eat only one meal/daily.  She is active and spends 60-min on her stationary bike daily.  She has fear of gaining weight.    She has previously been evaluated evaluated at [facility] neurology by Dr. Tommas Olp for leg weakness and cognitive complaints in 2019.  MRI brain dated 03/09/2018 showed mild generalized volume loss, no focal findings and was recommended to undergo neuropsychological testing.   Out-side paper records, electronic medical record, and images have been reviewed where available and summarized as:  MRI brain 03/09/2018:   1. Mild generalized cerebral and cerebellar volume loss. This is greater than average for age, but nonspecific. 2. Mild dolichocephaly (scaphocephaly), as described previously. 3. No evidence of acute intracranial abnormality.    Past Medical History:  Diagnosis Date  . Asthma   . Bipolar 1 disorder ([place])   . Fibromyalgia   . GAD (generalized anxiety disorder)   . Gastroparesis   . GERD (gastroesophageal reflux disease)   . Hiatal hernia   . History of kidney stones   . History of primary hyperparathyroidism    s/p  bilateral inferior parathyroidectomy 08-03-2010  . IBS (irritable bowel syndrome)   . Left ureteral stone   . Migraine   . PONV (postoperative nausea and vomiting)    and claustrophobic with mask  . RA (rheumatoid arthritis) ([place])    dr Gavin Pound-- rheumtologist  . Self mutilating behavior   . Thyroid disease   . Urgency of urination   .  Wears glasses     Past Surgical History:  Procedure Laterality Date  . ABDOMINAL HYSTERECTOMY    . BALLOON DILATION N/A 10/01/2014   Procedure: BALLOON DILATION;  Surgeon: Garlan Fair, MD;  Location: [facility];  Service: Endoscopy;  Laterality: N/A;  . CYSTO/  BILATERAL RETROGRADE PYELOGRAM  11/20/2000  . CYSTO/  RIGHT RETROGRADE PYELOGRAM/  RIGHT  URETEROSCOPY/  STENT PLACEMENT  12/16/2006  . CYSTOSCOPY/URETEROSCOPY/HOLMIUM LASER/STENT PLACEMENT Left 11/09/2016   Procedure: CYSTOSCOPY/URETEROSCOPY/HOLMIUM LASER/STENT PLACEMENT;  Surgeon: Nickie Retort, MD;  Location[facility]  Service: Urology;  Laterality: Left;  90 MINS  [phone]   . ESOPHAGEAL MANOMETRY N/A 12/23/2014   Procedure: ESOPHAGEAL MANOMETRY (EM);  Surgeon: Garlan Fair, MD;  Location: [facility];  Service: Endoscopy;  Laterality: N/A;  . ESOPHAGOGASTRODUODENOSCOPY  last one 03-28-2015  . ESOPHAGOGASTRODUODENOSCOPY (EGD) WITH PROPOFOL N/A 10/01/2014   Procedure: ESOPHAGOGASTRODUODENOSCOPY (EGD) WITH PROPOFOL;  Surgeon: Garlan Fair, MD;  Location: [facility];  Service: Endoscopy;  Laterality: N/A;  . EXTRACORPOREAL SHOCK WAVE LITHOTRIPSY  multiple times since age 5  . HOLMIUM LASER APPLICATION Left 0/07/8118   Procedure: HOLMIUM LASER APPLICATION;  Surgeon: Nickie Retort, MD;  Location: [facility];  Service: Urology;  Laterality: Left;  . KNEE ARTHROSCOPY Right 03-04-2014  [facility]   w/ Arthrotomy  . LAPAROSCOPIC ASSISTED VAGINAL HYSTERECTOMY  12/28/2005  . LAPAROSCOPIC CHOLECYSTECTOMY  01/17/2004  . LAPAROSCOPY LEFT OVARIAN CYSTECTOMY/  BILATERAL TUBAL LIGATION  02/26/2003  . LEFT URETEROSCOPIC STONE EXTRACTION /  STENT PLACEMENT  07/09/2002  . NECK EXPLORATION/  BILATERAL INFERIOR PARATHYROIDECTOMY  08/03/2010  . RIGHT URETERAL DILATION/  URETEROSCOPIC STONE EXTRACTION  06/12/2010  . ROUX-EN-Y GASTRIC BYPASS  2001  . SOLYX TRANSURETHRAL SLING/  POSTERIOR PELVIC FLOOR SACROSPINOUS REPAIR  02/21/2009   and Cysto/  Bilateral ureteral stent placement  . TONSILLECTOMY AND ADENOIDECTOMY    . WRIST GANGLION EXCISION Right 01/28/2000     Medications:  Outpatient Encounter Medications as of 12/11/2018  Medication Sig  . ALPRAZolam (XANAX) 0.5 MG tablet   . ARIPiprazole (ABILIFY) 5 MG tablet TK 1 T PO QHS  . ferrous  sulfate 325 (65 FE) MG EC tablet Take 325 mg by mouth 2 (two) times daily.  . furosemide (LASIX) 10 MG/ML injection Inject 4 mLs (40 mg total) into the vein every 12 (twelve) hours.  Marland Kitchen lactulose (CHRONULAC) 10 GM/15ML solution Place 45 mLs (30 g total) into feeding tube every 6 (six) hours.  Marland Kitchen levothyroxine (SYNTHROID, LEVOTHROID) 100 MCG SOLR injection Inject 1.25 mLs (25 mcg total) into the vein daily.  Marland Kitchen LORazepam (ATIVAN) 0.5 MG tablet   . Melatonin 3 MG TABS Take by mouth.  . Multiple Vitamins-Minerals (MULTIVITAMIN WITH MINERALS) tablet Take by mouth.  . pantoprazole (PROTONIX) 40 MG tablet pantoprazole 40 mg tablet,delayed release  . Prenatal Vit-Fe Fumarate-FA (PNV PRENATAL PLUS MULTIVITAMIN) 27-1 MG TABS Take by mouth.  . spironolactone (ALDACTONE) 50 MG tablet spironolactone 50 mg tablet  . traZODone (DESYREL) 150 MG tablet   . [DISCONTINUED] ceFEPIme 2 g in sodium chloride 0.9 % 100 mL Inject 2 g into the vein every 8 (eight) hours.  . [DISCONTINUED] famotidine (PEPCID) 20-0.9 MG/50ML-% Inject 50 mLs (20 mg total) into the vein every 12 (twelve) hours.  . [DISCONTINUED] ondansetron (ZOFRAN) 4 MG/2ML SOLN injection Inject 2 mLs (4 mg total) into the vein every 6 (six) hours as needed for nausea.  . [DISCONTINUED] rifaximin (XIFAXAN) 550 MG TABS tablet Take 1 tablet (  550 mg total) by mouth 2 (two) times daily.  . [DISCONTINUED] vancomycin (VANCOCIN) 1-5 GM/200ML-% SOLN Inject 200 mLs (1,000 mg total) into the vein every 8 (eight) hours.   No facility-administered encounter medications on file as of 12/11/2018.      Allergies:  Allergies  Allergen Reactions  . Gabapentin Other (See Comments)    Leg swelling Leg swelling   . Sulfasalazine Hives  . Lamictal [Lamotrigine] Nausea And Vomiting and Other (See Comments)    Tremors, diplopia  . Penicillins Hives and Itching    As a child, "breathing, itching problem" Has patient had a PCN reaction causing immediate rash,  facial/tongue/throat swelling, SOB or lightheadedness with hypotension: Yes Has patient had a PCN reaction causing severe rash involving mucus membranes or skin necrosis: Yes Has patient had a PCN reaction that required hospitalization No Has patient had a PCN reaction occurring within the last 10 years: No If all of the above answers are "NO", then may proceed with Cephalosporin use.   . Sulfa Antibiotics Hives  . Morphine And Related Itching and Rash    Family History: Family History  Problem Relation Age of Onset  . Bipolar disorder Son   . Breast cancer Mother   . Breast cancer Sister   . Anxiety disorder Brother   . Depression Brother   . Breast cancer Maternal Aunt   . Breast cancer Maternal Grandmother     Social History: Social History   Tobacco Use  . Smoking status: Current Every Day Smoker    Packs/day: 0.25    Years: 20.00    Pack years: 5.00    Types: Cigarettes  . Smokeless tobacco: Never Used  Substance Use Topics  . Alcohol use: Yes    Comment: occ  . Drug use: No   Social History   Social History Narrative   Lives with husband, son and son's best friend in a one story home.  Education: Oceanographer.     Review of Systems:  CONSTITUTIONAL: No fevers, chills, night sweats, +weight loss.   EYES: No visual changes or eye pain ENT: No hearing changes.  No history of nose bleeds.   RESPIRATORY: No cough, wheezing and shortness of breath.   CARDIOVASCULAR: Negative for chest pain, and palpitations.   GI: Negative for abdominal discomfort, blood in stools or black stools.  No recent change in bowel habits.   GU:  No history of incontinence.   MUSCLOSKELETAL: No history of joint pain or swelling.  No myalgias.   SKIN: Negative for lesions, rash, and itching.   HEMATOLOGY/ONCOLOGY: Negative for prolonged bleeding, bruising easily, and swollen nodes.  No history of cancer.   ENDOCRINE: Negative for cold or heat intolerance, polydipsia or goiter.   PSYCH:   +depression or anxiety symptoms.   NEURO: As Above.   Vital Signs:  BP 110/70   Pulse 79   Ht 5\' 7"  (1.702 m)   Wt 193 lb 8 oz (87.8 kg)   SpO2 98%   BMI 30.31 kg/m    General Medical Exam:   General:  Well appearing, comfortable, moderate frontal hair baldness Eyes/ENT: see cranial nerve examination.   Neck:   No carotid bruits. Respiratory:  Clear to auscultation, good air entry bilaterally.   Cardiac:  Regular rate and rhythm, no murmur.   Extremities:  No deformities, edema, or skin discoloration.  Skin:  No rashes or lesions.  Neurological Exam: MENTAL STATUS including orientation to time, place, person, recent and remote memory, attention span  and concentration, language, and fund of knowledge is normal.  Speech is not dysarthric.  CRANIAL NERVES: II:  No visual field defects.  Unremarkable fundi.   III-IV-VI: Pupils equal round and reactive to light.  Normal conjugate, extra-ocular eye movements in all directions of gaze.  No nystagmus.  No ptosis.   V:  Normal facial sensation.    VII:  Normal facial symmetry and movements. VIII:  Normal hearing and vestibular function.   IX-X:  Normal palatal movement.   XI:  Normal shoulder shrug and head rotation.   XII:  Normal tongue strength and range of motion, no deviation or fasciculation.  MOTOR:  Mild right TA atrophy.  No fasciculations or abnormal movements.  No pronator drift.  Tone is normal.    Right Upper Extremity:    Left Upper Extremity:    Deltoid  5/5   Deltoid  5/5   Biceps  5/5   Biceps  5/5   Triceps  5/5   Triceps  5/5   Wrist extensors  5/5   Wrist extensors  5/5   Wrist flexors  5/5   Wrist flexors  5/5   Finger extensors  5/5   Finger extensors  5/5   Finger flexors  5/5   Finger flexors  5/5   Dorsal interossei  5/5   Dorsal interossei  5/5   Abductor pollicis  5/5   Abductor pollicis  5/5   Tone (Ashworth scale)  0  Tone (Ashworth scale)  0   Right Lower Extremity:    Left Lower Extremity:      Hip flexors  5/5   Hip flexors  5/5   Hip extensors  5/5   Hip extensors  5/5   Knee flexors  5/5   Knee flexors  5/5   Knee extensors  5/5   Knee extensors  5/5   Dorsiflexors  4/5   Dorsiflexors  5/5   Plantarflexors  5/5   Plantarflexors  5/5   Eversion 4/5  Eversion 5/5  Inversion 5/5  Inversion 5/5  Toe extensors  4/5   Toe extensors  5/5   Toe flexors  5/5   Toe flexors  5/5   Tone (Ashworth scale)  0  Tone (Ashworth scale)  0   MSRs:  Right                                                                 Left brachioradialis 2+  brachioradialis 2+  biceps 2+  biceps 2+  triceps 2+  triceps 2+  patellar 2+  patellar 2+  ankle jerk 1+  ankle jerk 2+  Hoffman no  Hoffman no  plantar response down  plantar response down   SENSORY:  Reduced pin prick and temperature over the right lateral lower leg and dorsum of the foot.  Vibration is intact throughout.  Sensation is normal in the left leg and arms.  Mild sway with Rhomberg testing.  COORDINATION/GAIT: Normal finger-to- nose-finger.  Intact rapid alternating movements bilaterally. Gait is mildly wide-based, stable and without high steppage.  She is unable to stand on heels on the right foot, normal on the left.  Toe walking is normal   IMPRESSION: 1.  Probable right peroneal mononeuropathy.  Exam shows weakness of  peroneal-innervated muscles and spares inversion favoring against L5 radiculopathy.  She had NCS/EMG of the right leg and I will request this report be sent to me, so I can review.  This is most likely due to combination of nutrient deficiency and compression of the nerve at the fibular head.  With history of gastric bypass and protein-calorie malnutrition, she is at high risk for nutritional deficiency.  I will check vitamin B12, vitamin B1, folate, copper, zinc, selenium. Avoid crossing leg to reduce direct compression of the nerve at the knee.  Start home exercises for foot strengthening.  If symptoms get worse, consider  AFO and formal PT.  Fall precautions discussed.   2.  Right meralgia paresthetica, stable.  Symptoms started in pregnancy and did not improve.  She has constant numbness over the right lateral thigh, which will be lasting deficits.  Fortunately, there is no painful paresthesias.  3.  Protein-calorie malnutrition. She was strongly encouraged to start eating three nutritious meals daily as to prevent further neuropathy. She may want to discuss her fear of eating/gaining weight with her psychiatrist to see if she needs counseling specific to this.   Return to clinic in 3 months  Thank you for allowing me to participate in patient's care.  If I can answer any additional questions, I would be pleased to do so.    Sincerely,    Margeaux Swantek K. Posey Pronto, DO

## 2018-12-11 NOTE — Patient Instructions (Addendum)
Check labs  You need to start eating three nutritious meals daily  Avoid crossing your legs  Return to clinic in 3 months

## 2018-12-14 ENCOUNTER — Telehealth: Payer: Self-pay | Admitting: Neurology

## 2018-12-14 NOTE — Telephone Encounter (Signed)
Needing results on recent lab work. Please call her back at [phone]. Thanks!

## 2018-12-14 NOTE — Telephone Encounter (Signed)
Looks like labs are not completed. Patient made aware.

## 2018-12-15 ENCOUNTER — Telehealth: Payer: Self-pay | Admitting: *Deleted

## 2018-12-15 ENCOUNTER — Encounter: Payer: Self-pay | Admitting: *Deleted

## 2018-12-15 LAB — SELENIUM SERUM: SELENIUM, BLOOD: 118 ug/L (ref 63–160)

## 2018-12-15 LAB — VITAMIN B1: Vitamin B1 (Thiamine): 6 nmol/L — ABNORMAL LOW (ref 8–30)

## 2018-12-15 LAB — ZINC: ZINC: 70 ug/dL (ref 60–130)

## 2018-12-15 LAB — COPPER, SERUM: Copper: 24 ug/dL — ABNORMAL LOW (ref 70–175)

## 2018-12-15 NOTE — Telephone Encounter (Signed)
Results sent via My Chart.  

## 2018-12-15 NOTE — Telephone Encounter (Signed)
-----   Message from Alda Berthold, DO sent at 12/15/2018  1:51 PM EST ----- Please inform patient that her copper, vitamin B1 levels are very low and B12 is low-normal.  Start copper 8mg  x 1 week, then 6mg  x 1 week, then 4mg  x 1 week, then stay on 2mg  daily.  Start thiamine 100mg  daily and B12 1085mcg daily. Encourage her to eat three meals daily. Thanks.

## 2018-12-18 DIAGNOSIS — F3181 Bipolar II disorder: Secondary | ICD-10-CM | POA: Diagnosis not present

## 2018-12-19 DIAGNOSIS — R748 Abnormal levels of other serum enzymes: Secondary | ICD-10-CM | POA: Diagnosis not present

## 2018-12-20 DIAGNOSIS — D509 Iron deficiency anemia, unspecified: Secondary | ICD-10-CM | POA: Diagnosis not present

## 2018-12-20 DIAGNOSIS — E785 Hyperlipidemia, unspecified: Secondary | ICD-10-CM | POA: Diagnosis not present

## 2018-12-20 DIAGNOSIS — R748 Abnormal levels of other serum enzymes: Secondary | ICD-10-CM | POA: Diagnosis not present

## 2018-12-20 DIAGNOSIS — Z8719 Personal history of other diseases of the digestive system: Secondary | ICD-10-CM | POA: Diagnosis not present

## 2018-12-20 DIAGNOSIS — E039 Hypothyroidism, unspecified: Secondary | ICD-10-CM | POA: Diagnosis not present

## 2018-12-20 DIAGNOSIS — R739 Hyperglycemia, unspecified: Secondary | ICD-10-CM | POA: Diagnosis not present

## 2018-12-20 DIAGNOSIS — G629 Polyneuropathy, unspecified: Secondary | ICD-10-CM | POA: Diagnosis not present

## 2018-12-20 DIAGNOSIS — F319 Bipolar disorder, unspecified: Secondary | ICD-10-CM | POA: Diagnosis not present

## 2018-12-21 ENCOUNTER — Telehealth: Payer: Self-pay | Admitting: Neurology

## 2018-12-21 ENCOUNTER — Encounter: Payer: Self-pay | Admitting: *Deleted

## 2018-12-21 NOTE — Telephone Encounter (Signed)
EMG bilateral lower extremities 09/14/2018 performed at [facility]: This is a technically limited study due to the patient's right lower leg edema and active participation on exam.  This is an abnormal study with electrodiagnostic evidence of the following 1.  Severe sensory and motor axonal demyelinating polyneuropathy with chronic denervation 2.  Possible superimposed right peroneal neuropathy versus technical issues related to testing.  The absent right peroneal motor and sensory responses may be secondary to lower extremity edema and inability to properly record potentials.  Please inform patient that I received her EMG results and reviewed this.  I do believe that she has a peroneal neuropathy, continue management as previously discussed.  If there is any new weakness, numbness or tingling, I would like to repeat the study in the future.  Repeat testing may be indicated.  Mellany Dinsmore K. Posey Pronto, DO

## 2018-12-21 NOTE — Telephone Encounter (Signed)
Results and instructions sent via My Chart.  

## 2019-01-04 DIAGNOSIS — R3129 Other microscopic hematuria: Secondary | ICD-10-CM | POA: Insufficient documentation

## 2019-01-04 HISTORY — DX: Other microscopic hematuria: R31.29

## 2019-01-14 DIAGNOSIS — J019 Acute sinusitis, unspecified: Secondary | ICD-10-CM | POA: Diagnosis not present

## 2019-01-14 DIAGNOSIS — J209 Acute bronchitis, unspecified: Secondary | ICD-10-CM | POA: Diagnosis not present

## 2019-01-17 DIAGNOSIS — F3181 Bipolar II disorder: Secondary | ICD-10-CM | POA: Diagnosis not present

## 2019-02-14 DIAGNOSIS — F3181 Bipolar II disorder: Secondary | ICD-10-CM | POA: Diagnosis not present

## 2019-02-20 DIAGNOSIS — J019 Acute sinusitis, unspecified: Secondary | ICD-10-CM | POA: Diagnosis not present

## 2019-02-22 NOTE — Progress Notes (Signed)
Virtual Visit via Video Note The purpose of this virtual visit is to provide medical care while limiting exposure to the novel coronavirus.    Consent was obtained for video visit:  Yes.   Answered questions that patient had about telehealth interaction:  Yes.   I discussed the limitations, risks, security and privacy concerns of performing an evaluation and management service by telemedicine. I also discussed with the patient that there may be a patient responsible charge related to this service. The patient expressed understanding and agreed to proceed.  Pt location: Home Physician Location: office Name of referring provider:  Josetta Huddle, MD I connected with Jordan Russell at patients initiation/request on 02/23/2019 at  1:00 PM EDT by video enabled telemedicine application and verified that I am speaking with the correct person using two identifiers. Pt MRN:  [id number] Pt DOB:  1971/07/29 Video Participants:  Jordan Russell   History of Present Illness: This is a 48 y.o. female  hypothyroidism, GERD, hypertension, bipolar depression, hyperparathyroidism, fibromyalgia, rheumatoid arthritis, irritable bowel syndrome, history of gastric bypass with malnutrition and copper deficiency returning for follow-up of right foot drop. Her EMG results were reviewed which is most consistent with a right peroneal mononeuropathy. Labs indicated deficiency and vitamin B 12, B1, copper.  She has been compliant with taking her supplements and notes that her right foot paresthesias were worse when she dropped copper down to 2mg  daily.  She was eating better soon after her last visit, but since [place], her anxiety has been worse and she has avoided eating again.  Overall, the numbness in the right foot had improved and now only involves the toes.  She continues to have weakness of the right foot especially with dorsiflexion and toe extension.  Her ability to evert the foot has improved. She has not  had any falls.    She also complains of resting hand tremors which is intermittent and not associated with anxiety.  She takes lithium 300mg  twice daily for bipolar disorder.    Observations/Objective:   Vitals:   02/23/19 0938  Weight: 180 lb (81.6 kg)  Height: 5\' 6"  (1.676 m)   Patient is awake, alert, and appears comfortable.  Oriented x 4.   Extraocular muscles are intact. No ptosis.  Face is symmetric.  Speech is not dysarthric.  Antigravity in all extremities.  Right foot with limited dorsiflexion and toe extension, but she is able to move antigravity.  ROM of right foot eversion appears improved.   No pronator drift.  Bilateral high frequency, low amplitude hand tremor at rest and when outstretched.   Data: EMG bilateral lower extremities 09/14/2018 performed at [facility]: This is a technically limited study due to the patient's right lower leg edema and active participation on exam.  This is an abnormal study with electrodiagnostic evidence of the following 1.  Severe sensory and motor axonal demyelinating polyneuropathy with chronic denervation 2.  Possible superimposed right peroneal neuropathy versus technical issues related to testing.  The absent right peroneal motor and sensory responses may be secondary to lower extremity edema and inability to properly record potentials.  Labs 12/11/2018: Vitamin B12 297, selenium 118, zinc 70, copper 24*, folate 19.4, vitamin B1 6*  Assessment and Plan:  1.  Right peroneal mononeuropathy, likely contributed to combination of nutrient deficiency and nerve compression at the fibular head, overall stable.  Patient again educated on eating healthy nutritious meals Avoid crossing the legs to minimize compression of the nerve Continue home  exercises  2.  Malnutrition with nutrient deficiency.  Labs indicate vitamin B 12, vitamin B1, and copper deficiency.  Increase copper to 4mg  daily x 2 weeks, then 20mg  daily Continue vitamin B12  1000 mcg daily and vitamin B1 100 mg daily, Check levels at next visit.  3.  Right meralgia paresthetica, stable.  Numbness of the lateral thigh started in pregnancy and remain unchanged.   Follow Up Instructions:   I discussed the assessment and treatment plan with the patient. The patient was provided an opportunity to ask questions and all were answered. The patient agreed with the plan and demonstrated an understanding of the instructions.   The patient was advised to call back or seek an in-person evaluation if the symptoms worsen or if the condition fails to improve as anticipated.  Follow-up in 3 months   Alda Berthold, DO

## 2019-02-23 ENCOUNTER — Encounter: Payer: Self-pay | Admitting: Neurology

## 2019-02-23 ENCOUNTER — Telehealth (INDEPENDENT_AMBULATORY_CARE_PROVIDER_SITE_OTHER): Payer: BC Managed Care – PPO | Admitting: Neurology

## 2019-02-23 ENCOUNTER — Encounter: Payer: Self-pay | Admitting: *Deleted

## 2019-02-23 ENCOUNTER — Other Ambulatory Visit: Payer: Self-pay

## 2019-02-23 VITALS — Ht 66.0 in | Wt 180.0 lb

## 2019-02-23 DIAGNOSIS — E43 Unspecified severe protein-calorie malnutrition: Secondary | ICD-10-CM | POA: Diagnosis not present

## 2019-02-23 DIAGNOSIS — R748 Abnormal levels of other serum enzymes: Secondary | ICD-10-CM | POA: Diagnosis not present

## 2019-02-23 DIAGNOSIS — G5711 Meralgia paresthetica, right lower limb: Secondary | ICD-10-CM

## 2019-02-23 DIAGNOSIS — G5701 Lesion of sciatic nerve, right lower limb: Secondary | ICD-10-CM

## 2019-02-27 DIAGNOSIS — R748 Abnormal levels of other serum enzymes: Secondary | ICD-10-CM | POA: Diagnosis not present

## 2019-03-13 DIAGNOSIS — R748 Abnormal levels of other serum enzymes: Secondary | ICD-10-CM | POA: Diagnosis not present

## 2019-03-16 ENCOUNTER — Ambulatory Visit: Payer: BLUE CROSS/BLUE SHIELD | Admitting: Neurology

## 2019-03-19 DIAGNOSIS — K719 Toxic liver disease, unspecified: Secondary | ICD-10-CM | POA: Diagnosis not present

## 2019-04-12 DIAGNOSIS — F3181 Bipolar II disorder: Secondary | ICD-10-CM | POA: Diagnosis not present

## 2019-05-21 DIAGNOSIS — K719 Toxic liver disease, unspecified: Secondary | ICD-10-CM | POA: Diagnosis not present

## 2019-05-28 DIAGNOSIS — R748 Abnormal levels of other serum enzymes: Secondary | ICD-10-CM | POA: Diagnosis not present

## 2019-05-28 DIAGNOSIS — K7290 Hepatic failure, unspecified without coma: Secondary | ICD-10-CM | POA: Diagnosis not present

## 2019-05-28 DIAGNOSIS — T148XXA Other injury of unspecified body region, initial encounter: Secondary | ICD-10-CM | POA: Diagnosis not present

## 2019-05-28 DIAGNOSIS — K719 Toxic liver disease, unspecified: Secondary | ICD-10-CM | POA: Diagnosis not present

## 2019-05-30 ENCOUNTER — Other Ambulatory Visit: Payer: Self-pay | Admitting: Gastroenterology

## 2019-05-30 DIAGNOSIS — R748 Abnormal levels of other serum enzymes: Secondary | ICD-10-CM

## 2019-05-30 DIAGNOSIS — Z8719 Personal history of other diseases of the digestive system: Secondary | ICD-10-CM

## 2019-06-04 ENCOUNTER — Ambulatory Visit: Payer: PRIVATE HEALTH INSURANCE | Admitting: Neurology

## 2019-06-06 ENCOUNTER — Encounter: Payer: Self-pay | Admitting: Neurology

## 2019-06-07 ENCOUNTER — Ambulatory Visit
Admission: RE | Admit: 2019-06-07 | Discharge: 2019-06-07 | Disposition: A | Discharge status: Home / Self Care | Payer: BLUE CROSS/BLUE SHIELD | Source: Ambulatory Visit | Attending: Gastroenterology | Admitting: Gastroenterology

## 2019-06-07 DIAGNOSIS — K719 Toxic liver disease, unspecified: Secondary | ICD-10-CM | POA: Diagnosis not present

## 2019-06-07 DIAGNOSIS — Z8719 Personal history of other diseases of the digestive system: Secondary | ICD-10-CM

## 2019-06-07 DIAGNOSIS — R748 Abnormal levels of other serum enzymes: Secondary | ICD-10-CM | POA: Diagnosis not present

## 2019-06-07 NOTE — Progress Notes (Signed)
Rescheduled

## 2019-06-08 ENCOUNTER — Telehealth: Payer: Self-pay

## 2019-06-08 ENCOUNTER — Other Ambulatory Visit: Payer: Self-pay

## 2019-06-08 ENCOUNTER — Telehealth (INDEPENDENT_AMBULATORY_CARE_PROVIDER_SITE_OTHER): Payer: BLUE CROSS/BLUE SHIELD | Admitting: Neurology

## 2019-06-08 VITALS — Ht 67.0 in | Wt 180.0 lb

## 2019-06-08 DIAGNOSIS — E610 Copper deficiency: Secondary | ICD-10-CM

## 2019-06-08 DIAGNOSIS — E538 Deficiency of other specified B group vitamins: Secondary | ICD-10-CM | POA: Diagnosis not present

## 2019-06-08 DIAGNOSIS — E519 Thiamine deficiency, unspecified: Secondary | ICD-10-CM | POA: Diagnosis not present

## 2019-06-08 DIAGNOSIS — G5701 Lesion of sciatic nerve, right lower limb: Secondary | ICD-10-CM | POA: Diagnosis not present

## 2019-06-08 NOTE — Telephone Encounter (Signed)
Scheduled labs and ordered for next week and follow up appt scheduled. Pt advised

## 2019-06-08 NOTE — Progress Notes (Signed)
   Virtual Visit via Video Note The purpose of this virtual visit is to provide medical care while limiting exposure to the novel coronavirus.    Consent was obtained for video visit:  Yes.   Answered questions that patient had about telehealth interaction:  Yes.   I discussed the limitations, risks, security and privacy concerns of performing an evaluation and management service by telemedicine. I also discussed with the patient that there may be a patient responsible charge related to this service. The patient expressed understanding and agreed to proceed.  Pt location: Home Physician Location: office Name of referring provider:  Josetta Huddle, MD I connected with [name] at patients initiation/request on 06/08/2019 at  1:50 PM EDT by video enabled telemedicine application and verified that I am speaking with the correct person using two identifiers. Pt MRN:  [id number] Pt DOB:  1971-07-08 Video Participants:  [name]   History of Present Illness: This is a 48 y.o. female returning for follow-up of right peroneal neuropathy and vitamin deficiencies due to malnutrition.  She has noticed improvement in the right foot such that she is able to extend the foot and toes, much better than before.  The right great toe still is very weak and does not extend.  Her numbness no longer involves the lower portion of the leg and is restricted to the great toe.  She is walking much better and does not use ankle-foot orthotic.  She is still not eating as well as she should, but does try to eat 1 nutritious meal daily.  She is more concerned about recent elevation in liver enzymes which is being investigated by gastroenterology.   Observations/Objective:   Vitals:   06/06/19 1032  Weight: 180 lb (81.6 kg)  Height: 5\' 7"  (1.702 m)   Patient is awake, alert, and appears comfortable.  Oriented x 4.   Extraocular muscles are intact. No ptosis.  Face is symmetric.  Speech is not  dysarthric.  Antigravity in all extremities.  She is able to dorsiflex the right foot about 2 inches off the floor and extend all the toes except for the right great toe.  No pronator drift. Gait appears normal, no foot drop.  She is able to stand on heels and toes (improved).   Assessment and Plan:  1.  Right peroneal mononeuropathy, contributed by nutrient deficiency and nerve compression at the fibular head, improved.  She continues to have weakness and numbness in the right toe, but overall is doing much better.  I continue to encourage her to do home exercises  2.  Malnutrition with nutrient deficiency.  Labs indicate vitamin B12, vitamin B1, and copper deficiency.  Continue copper 2 mg daily, vitamin B12 1000 mcg daily and vitamin B1 100 mg daily.  I will check these levels to be sure she is absorbing oral supplementation.   Follow Up Instructions:   I discussed the assessment and treatment plan with the patient. The patient was provided an opportunity to ask questions and all were answered. The patient agreed with the plan and demonstrated an understanding of the instructions.   The patient was advised to call back or seek an in-person evaluation if the symptoms worsen or if the condition fails to improve as anticipated.  Follow-up in 4 months  Total time spent:  25 minutes     Alda Berthold, DO

## 2019-06-11 DIAGNOSIS — F3181 Bipolar II disorder: Secondary | ICD-10-CM | POA: Diagnosis not present

## 2019-06-12 ENCOUNTER — Other Ambulatory Visit: Payer: Self-pay

## 2019-06-12 ENCOUNTER — Other Ambulatory Visit (INDEPENDENT_AMBULATORY_CARE_PROVIDER_SITE_OTHER): Payer: BLUE CROSS/BLUE SHIELD

## 2019-06-12 DIAGNOSIS — G5701 Lesion of sciatic nerve, right lower limb: Secondary | ICD-10-CM

## 2019-06-12 DIAGNOSIS — E538 Deficiency of other specified B group vitamins: Secondary | ICD-10-CM | POA: Diagnosis not present

## 2019-06-12 DIAGNOSIS — E610 Copper deficiency: Secondary | ICD-10-CM | POA: Diagnosis not present

## 2019-06-12 DIAGNOSIS — E519 Thiamine deficiency, unspecified: Secondary | ICD-10-CM | POA: Diagnosis not present

## 2019-06-16 LAB — VITAMIN B12: Vitamin B-12: 624 pg/mL (ref 200–1100)

## 2019-06-16 LAB — VITAMIN B1: Vitamin B1 (Thiamine): <6 nmol/L — ABNORMAL LOW (ref 8–30)

## 2019-06-16 LAB — COPPER, SERUM: Copper: 76 ug/dL (ref 70–175)

## 2019-06-29 DIAGNOSIS — K7290 Hepatic failure, unspecified without coma: Secondary | ICD-10-CM | POA: Diagnosis not present

## 2019-06-29 DIAGNOSIS — K719 Toxic liver disease, unspecified: Secondary | ICD-10-CM | POA: Diagnosis not present

## 2019-08-06 DIAGNOSIS — F3181 Bipolar II disorder: Secondary | ICD-10-CM | POA: Diagnosis not present

## 2019-09-06 DIAGNOSIS — K719 Toxic liver disease, unspecified: Secondary | ICD-10-CM | POA: Diagnosis not present

## 2019-09-11 DIAGNOSIS — K719 Toxic liver disease, unspecified: Secondary | ICD-10-CM | POA: Diagnosis not present

## 2019-10-01 DIAGNOSIS — J0191 Acute recurrent sinusitis, unspecified: Secondary | ICD-10-CM | POA: Diagnosis not present

## 2019-10-02 DIAGNOSIS — F3181 Bipolar II disorder: Secondary | ICD-10-CM | POA: Diagnosis not present

## 2019-10-02 DIAGNOSIS — Z5181 Encounter for therapeutic drug level monitoring: Secondary | ICD-10-CM | POA: Diagnosis not present

## 2019-10-08 ENCOUNTER — Telehealth: Payer: BC Managed Care – PPO | Admitting: Neurology

## 2019-11-20 DIAGNOSIS — F3181 Bipolar II disorder: Secondary | ICD-10-CM | POA: Diagnosis not present

## 2019-11-21 DIAGNOSIS — F3181 Bipolar II disorder: Secondary | ICD-10-CM | POA: Diagnosis not present

## 2019-11-26 DIAGNOSIS — R945 Abnormal results of liver function studies: Secondary | ICD-10-CM | POA: Diagnosis not present

## 2019-11-27 DIAGNOSIS — F3181 Bipolar II disorder: Secondary | ICD-10-CM | POA: Diagnosis not present

## 2019-12-20 DIAGNOSIS — F3181 Bipolar II disorder: Secondary | ICD-10-CM | POA: Diagnosis not present

## 2020-01-14 DIAGNOSIS — K719 Toxic liver disease, unspecified: Secondary | ICD-10-CM | POA: Diagnosis not present

## 2020-01-16 DIAGNOSIS — F3181 Bipolar II disorder: Secondary | ICD-10-CM | POA: Diagnosis not present

## 2020-01-25 ENCOUNTER — Other Ambulatory Visit: Payer: Self-pay | Admitting: Gastroenterology

## 2020-01-25 DIAGNOSIS — R109 Unspecified abdominal pain: Secondary | ICD-10-CM

## 2020-02-12 ENCOUNTER — Ambulatory Visit
Admission: RE | Admit: 2020-02-12 | Discharge: 2020-02-12 | Disposition: A | Discharge status: Home / Self Care | Payer: BC Managed Care – PPO | Source: Ambulatory Visit | Attending: Gastroenterology | Admitting: Gastroenterology

## 2020-02-12 DIAGNOSIS — R109 Unspecified abdominal pain: Secondary | ICD-10-CM

## 2020-02-12 MED ORDER — IOPAMIDOL (ISOVUE-300) INJECTION 61%
100.0000 mL | Freq: Once | INTRAVENOUS | Status: AC | PRN
  Administered 2020-02-12: 100 mL via INTRAVENOUS

## 2020-02-20 DIAGNOSIS — R1310 Dysphagia, unspecified: Secondary | ICD-10-CM | POA: Diagnosis not present

## 2020-02-20 DIAGNOSIS — K625 Hemorrhage of anus and rectum: Secondary | ICD-10-CM | POA: Diagnosis not present

## 2020-02-20 DIAGNOSIS — R112 Nausea with vomiting, unspecified: Secondary | ICD-10-CM | POA: Diagnosis not present

## 2020-02-25 DIAGNOSIS — Z1159 Encounter for screening for other viral diseases: Secondary | ICD-10-CM | POA: Diagnosis not present

## 2020-02-28 DIAGNOSIS — R1310 Dysphagia, unspecified: Secondary | ICD-10-CM | POA: Diagnosis not present

## 2020-02-28 DIAGNOSIS — K449 Diaphragmatic hernia without obstruction or gangrene: Secondary | ICD-10-CM | POA: Diagnosis not present

## 2020-02-28 DIAGNOSIS — K625 Hemorrhage of anus and rectum: Secondary | ICD-10-CM | POA: Diagnosis not present

## 2020-02-28 DIAGNOSIS — K209 Esophagitis, unspecified without bleeding: Secondary | ICD-10-CM | POA: Diagnosis not present

## 2020-02-28 DIAGNOSIS — R112 Nausea with vomiting, unspecified: Secondary | ICD-10-CM | POA: Diagnosis not present

## 2020-02-28 DIAGNOSIS — K222 Esophageal obstruction: Secondary | ICD-10-CM | POA: Diagnosis not present

## 2020-02-28 DIAGNOSIS — K621 Rectal polyp: Secondary | ICD-10-CM | POA: Diagnosis not present

## 2020-02-28 DIAGNOSIS — K219 Gastro-esophageal reflux disease without esophagitis: Secondary | ICD-10-CM | POA: Diagnosis not present

## 2020-02-28 DIAGNOSIS — D125 Benign neoplasm of sigmoid colon: Secondary | ICD-10-CM | POA: Diagnosis not present

## 2020-02-28 DIAGNOSIS — K648 Other hemorrhoids: Secondary | ICD-10-CM | POA: Diagnosis not present

## 2020-02-28 DIAGNOSIS — K635 Polyp of colon: Secondary | ICD-10-CM | POA: Diagnosis not present

## 2020-02-28 DIAGNOSIS — Z980 Intestinal bypass and anastomosis status: Secondary | ICD-10-CM | POA: Diagnosis not present

## 2020-03-03 DIAGNOSIS — F3181 Bipolar II disorder: Secondary | ICD-10-CM | POA: Diagnosis not present

## 2020-04-02 DIAGNOSIS — R748 Abnormal levels of other serum enzymes: Secondary | ICD-10-CM | POA: Diagnosis not present

## 2020-04-04 DIAGNOSIS — R748 Abnormal levels of other serum enzymes: Secondary | ICD-10-CM | POA: Diagnosis not present

## 2020-04-14 DIAGNOSIS — F3181 Bipolar II disorder: Secondary | ICD-10-CM | POA: Diagnosis not present

## 2020-05-22 DIAGNOSIS — F17210 Nicotine dependence, cigarettes, uncomplicated: Secondary | ICD-10-CM | POA: Insufficient documentation

## 2020-06-11 DIAGNOSIS — M797 Fibromyalgia: Secondary | ICD-10-CM | POA: Diagnosis not present

## 2020-06-11 DIAGNOSIS — R748 Abnormal levels of other serum enzymes: Secondary | ICD-10-CM | POA: Diagnosis not present

## 2020-06-11 DIAGNOSIS — E538 Deficiency of other specified B group vitamins: Secondary | ICD-10-CM | POA: Diagnosis not present

## 2020-06-11 DIAGNOSIS — R110 Nausea: Secondary | ICD-10-CM | POA: Diagnosis not present

## 2020-06-27 DIAGNOSIS — F3181 Bipolar II disorder: Secondary | ICD-10-CM | POA: Diagnosis not present

## 2020-07-08 DIAGNOSIS — R109 Unspecified abdominal pain: Secondary | ICD-10-CM | POA: Diagnosis not present

## 2020-07-08 DIAGNOSIS — R748 Abnormal levels of other serum enzymes: Secondary | ICD-10-CM | POA: Diagnosis not present

## 2020-07-08 DIAGNOSIS — E538 Deficiency of other specified B group vitamins: Secondary | ICD-10-CM | POA: Diagnosis not present

## 2020-07-08 DIAGNOSIS — R110 Nausea: Secondary | ICD-10-CM | POA: Diagnosis not present

## 2020-07-15 DIAGNOSIS — R1084 Generalized abdominal pain: Secondary | ICD-10-CM | POA: Diagnosis not present

## 2020-07-15 DIAGNOSIS — I959 Hypotension, unspecified: Secondary | ICD-10-CM | POA: Diagnosis not present

## 2020-08-15 DIAGNOSIS — G43519 Persistent migraine aura without cerebral infarction, intractable, without status migrainosus: Secondary | ICD-10-CM | POA: Diagnosis not present

## 2020-08-18 DIAGNOSIS — Z1152 Encounter for screening for COVID-19: Secondary | ICD-10-CM | POA: Diagnosis not present

## 2020-08-18 DIAGNOSIS — Z03818 Encounter for observation for suspected exposure to other biological agents ruled out: Secondary | ICD-10-CM | POA: Diagnosis not present

## 2020-08-18 DIAGNOSIS — R059 Cough, unspecified: Secondary | ICD-10-CM | POA: Diagnosis not present

## 2020-09-22 DIAGNOSIS — F3181 Bipolar II disorder: Secondary | ICD-10-CM | POA: Diagnosis not present

## 2020-10-03 DIAGNOSIS — K7290 Hepatic failure, unspecified without coma: Secondary | ICD-10-CM | POA: Diagnosis not present

## 2020-11-10 DIAGNOSIS — Z20822 Contact with and (suspected) exposure to covid-19: Secondary | ICD-10-CM | POA: Diagnosis not present

## 2020-11-19 ENCOUNTER — Ambulatory Visit: Payer: BC Managed Care – PPO | Admitting: Orthopaedic Surgery

## 2020-11-26 ENCOUNTER — Ambulatory Visit (INDEPENDENT_AMBULATORY_CARE_PROVIDER_SITE_OTHER): Payer: BC Managed Care – PPO | Admitting: Orthopaedic Surgery

## 2020-11-26 ENCOUNTER — Ambulatory Visit: Payer: Self-pay

## 2020-11-26 ENCOUNTER — Encounter: Payer: Self-pay | Admitting: Orthopaedic Surgery

## 2020-11-26 DIAGNOSIS — M7062 Trochanteric bursitis, left hip: Secondary | ICD-10-CM

## 2020-11-26 DIAGNOSIS — M25551 Pain in right hip: Secondary | ICD-10-CM

## 2020-11-26 DIAGNOSIS — M25552 Pain in left hip: Secondary | ICD-10-CM

## 2020-11-26 DIAGNOSIS — M7061 Trochanteric bursitis, right hip: Secondary | ICD-10-CM

## 2020-11-26 MED ORDER — LIDOCAINE HCL 1 % IJ SOLN
3.0000 mL | INTRAMUSCULAR | Status: AC | PRN
Start: 2020-11-26 — End: 2020-11-26
  Administered 2020-11-26: 3 mL

## 2020-11-26 MED ORDER — LIDOCAINE HCL 1 % IJ SOLN
3.0000 mL | INTRAMUSCULAR | Status: AC | PRN
  Administered 2020-11-26: 3 mL

## 2020-11-26 MED ORDER — METHYLPREDNISOLONE ACETATE 40 MG/ML IJ SUSP
40.0000 mg | INTRAMUSCULAR | Status: AC | PRN
  Administered 2020-11-26: 40 mg via INTRA_ARTICULAR

## 2020-11-26 NOTE — Progress Notes (Signed)
Office Visit Note   Patient: Jordan Russell           Date of Birth: 03-Jan-1971           MRN: [id number] Visit Date: 11/26/2020              Requested by: Josetta Huddle, MD [address] [place],  [place] [address] PCP: Josetta Huddle, MD   Assessment & Plan: Visit Diagnoses:  1. Bilateral hip pain   2. Pain in left hip   3. Pain in right hip   4. Trochanteric bursitis of left hip   5. Trochanteric bursitis, right hip     Plan: She understands that she does have quite significant bursitis of both her hips.  I did offer her steroid injection in both hip areas and she agreed to this and tolerated them well.  She has had it before in the left side.  She is aware the risk and benefits of steroid injections.  I have also recommended stretching and she is a great candidate for Voltaren gel to get over-the-counter and I explained this to her as well.  We can always repeat injections in 3 months if needed.  Follow-up is as needed.  All questions and concerns were answered and addressed.  Follow-Up Instructions: Return if symptoms worsen or fail to improve.   Orders:  Orders Placed This Encounter  Procedures  . XR HIPS BILAT W OR W/O PELVIS 3-4 VIEWS   No orders of the defined types were placed in this encounter.     Procedures: Large Joint Inj: R greater trochanter on 11/26/2020 2:15 PM Indications: pain and diagnostic evaluation Details: 22 G 1.5 in needle, lateral approach  Arthrogram: No  Medications: 3 mL lidocaine 1 %; 40 mg methylPREDNISolone acetate 40 MG/ML Outcome: tolerated well, no immediate complications Procedure, treatment alternatives, risks and benefits explained, specific risks discussed. Consent was given by the patient. Immediately prior to procedure a time out was called to verify the correct patient, procedure, equipment, support staff and site/side marked as required. Patient was prepped and draped in the usual sterile fashion.   Large  Joint Inj: L greater trochanter on 11/26/2020 2:15 PM Indications: pain and diagnostic evaluation Details: 22 G 1.5 in needle, lateral approach  Arthrogram: No  Medications: 3 mL lidocaine 1 %; 40 mg methylPREDNISolone acetate 40 MG/ML Outcome: tolerated well, no immediate complications Procedure, treatment alternatives, risks and benefits explained, specific risks discussed. Consent was given by the patient. Immediately prior to procedure a time out was called to verify the correct patient, procedure, equipment, support staff and site/side marked as required. Patient was prepped and draped in the usual sterile fashion.       Clinical Data: No additional findings.   Subjective: Chief Complaint  Patient presents with  . Left Hip - Pain  . Right Hip - Pain  The patient is a 50 year old female that I have actually seen remotely in the past for left hip trochanteric bursitis.  She comes in with bilateral hip pain is been going on for several years and is over the trochanteric area of both hips.  She does report just a little bit of groin pain.  She is someone who has been dealing with severe liver disease.  She says it hurts to stand and lay on each side and if she has been sitting and goes to stand up she gets lateral hip pain on both sides.  She is not a diabetic.  She has had a lot of falls.  She was hospitalized for this in amount of time and had deconditioning and had to go through therapy as well.  She is walking today without an assistive device.  HPI  Review of Systems Today she denies any fever, chills, nausea, vomiting  Objective: Vital Signs: There were no vitals taken for this visit.  Physical Exam She is alert and oriented x3 and in no acute distress Ortho Exam Examination of both hips show the move smoothly and fluidly with most of her pain over the trochanteric area and IT band on both sides which is quite significant to palpation. Specialty Comments:  No specialty  comments available.  Imaging: XR HIPS BILAT W OR W/O PELVIS 3-4 VIEWS  Result Date: 11/26/2020 An AP pelvis and lateral of both hips shows no acute findings.  The hip joint space is well-maintained on both hips.    PMFS History: Patient Active Problem List   Diagnosis Date Noted  . HCAP (healthcare-associated pneumonia) 06/09/2018  . Colitis   . Encephalopathy, hepatic ([place])   . Acute liver failure without hepatic coma   . E coli bacteremia   . Palliative care encounter   . Severe malnutrition ([place])   . Drug-induced liver injury 06/08/2018  . Elevated LFTs 06/08/2018  . Hyperammonemia ([place]) 06/08/2018  . Anasarca 06/08/2018  . Acute encephalopathy 06/05/2018  . Hypoxia 06/05/2018  . Hyperbilirubinemia 06/05/2018  . Fatty infiltration of liver 06/05/2018  . AKI (acute kidney injury) ([place]) 06/05/2018  . Protein-calorie malnutrition, severe 06/05/2018  . Severe sepsis ([place]) 06/04/2018  . Trochanteric bursitis, left hip 09/08/2017  . Pain in left hip 09/08/2017  . Asthma with acute exacerbation 12/17/2016  . Carpal tunnel syndrome of left wrist 12/17/2016  . Dysphagia, pharyngoesophageal phase 12/17/2016  . Fibromyalgia 12/17/2016  . H/O vitamin D deficiency 12/17/2016  . Hidradenitis suppurativa 12/17/2016  . Calculus of kidney 12/17/2016  . Hypoglycemia 12/17/2016  . Hyperglycemia 12/17/2016  . Hydronephrosis with renal and ureteral calculus obstruction 12/17/2016  . Hydradenitis 12/17/2016  . Hx of iron deficiency anemia 12/17/2016  . HPTH (hyperparathyroidism) ([place]) 12/17/2016  . Nocturia 12/17/2016  . Intractable migraine with aura without status migrainosus 12/17/2016  . Pain in left knee 12/17/2016  . Arthralgia of multiple joints 12/17/2016  . Avitaminosis D 12/17/2016  . Anemia, iron deficiency 12/17/2016  . Cutaneous eruption 12/17/2016  . Pain in joint 12/17/2016  . Varicose veins of both legs with edema 06/28/2016  . Adverse effects of medication  03/10/2016  . Bipolar disorder ([place]) 03/10/2016  . Rheumatoid arthritis ([place]) 03/10/2016  . Migraine 03/10/2016  . IBS (irritable bowel syndrome) 03/10/2016  . Anxiety 03/10/2016  . Asthma in adult 03/10/2016  . GERD (gastroesophageal reflux disease) 03/10/2016  . Epigastric pain 06/04/2015  . Pharyngoesophageal dysphagia 02/10/2015  . S/P right knee arthroscopy 04/16/2014  . Chondromalacia of knee 02/26/2014  . Knee pain 02/20/2014  . Bilateral knee pain 02/05/2014  . Vitamin D deficiency 12/14/2013  . S/P gastric bypass 12/14/2013   Past Medical History:  Diagnosis Date  . Asthma   . Bipolar 1 disorder ([place])   . Fibromyalgia   . GAD (generalized anxiety disorder)   . Gastroparesis   . GERD (gastroesophageal reflux disease)   . Hiatal hernia   . History of kidney stones   . History of primary hyperparathyroidism    s/p  bilateral inferior parathyroidectomy 08-03-2010  . IBS (irritable bowel syndrome)   . Left ureteral stone   .  Migraine   . PONV (postoperative nausea and vomiting)    and claustrophobic with mask  . RA (rheumatoid arthritis) ([place])    dr Gavin Pound-- rheumtologist  . Self mutilating behavior   . Thyroid disease   . Urgency of urination   . Wears glasses     Family History  Problem Relation Age of Onset  . Bipolar disorder Son   . Breast cancer Mother   . Breast cancer Sister   . Anxiety disorder Brother   . Depression Brother   . Breast cancer Maternal Aunt   . Breast cancer Maternal Grandmother     Past Surgical History:  Procedure Laterality Date  . ABDOMINAL HYSTERECTOMY    . BALLOON DILATION N/A 10/01/2014   Procedure: BALLOON DILATION;  Surgeon: Garlan Fair, MD;  Location: [facility];  Service: Endoscopy;  Laterality: N/A;  . CYSTO/  BILATERAL RETROGRADE PYELOGRAM  11/20/2000  . CYSTO/  RIGHT RETROGRADE PYELOGRAM/  RIGHT URETEROSCOPY/  STENT PLACEMENT  12/16/2006  . CYSTOSCOPY/URETEROSCOPY/HOLMIUM LASER/STENT PLACEMENT Left  11/09/2016   Procedure: CYSTOSCOPY/URETEROSCOPY/HOLMIUM LASER/STENT PLACEMENT;  Surgeon: Nickie Retort, MD;  Location: [facility];  Service: Urology;  Laterality: Left;  90 MINS  [phone]   . ESOPHAGEAL MANOMETRY N/A 12/23/2014   Procedure: ESOPHAGEAL MANOMETRY (EM);  Surgeon: Garlan Fair, MD;  Location: [facility];  Service: Endoscopy;  Laterality: N/A;  . ESOPHAGOGASTRODUODENOSCOPY  last one 03-28-2015  . ESOPHAGOGASTRODUODENOSCOPY (EGD) WITH PROPOFOL N/A 10/01/2014   Procedure: ESOPHAGOGASTRODUODENOSCOPY (EGD) WITH PROPOFOL;  Surgeon: Garlan Fair, MD;  Location: [facility];  Service: Endoscopy;  Laterality: N/A;  . EXTRACORPOREAL SHOCK WAVE LITHOTRIPSY  multiple times since age 79  . HOLMIUM LASER APPLICATION Left 02/05/6545   Procedure: HOLMIUM LASER APPLICATION;  Surgeon: Nickie Retort, MD;  Location: [facility];  Service: Urology;  Laterality: Left;  . KNEE ARTHROSCOPY Right 03-04-2014  [facility]   w/ Arthrotomy  . LAPAROSCOPIC ASSISTED VAGINAL HYSTERECTOMY  12/28/2005  . LAPAROSCOPIC CHOLECYSTECTOMY  01/17/2004  . LAPAROSCOPY LEFT OVARIAN CYSTECTOMY/  BILATERAL TUBAL LIGATION  02/26/2003  . LEFT URETEROSCOPIC STONE EXTRACTION /  STENT PLACEMENT  07/09/2002  . NECK EXPLORATION/  BILATERAL INFERIOR PARATHYROIDECTOMY  08/03/2010  . RIGHT URETERAL DILATION/  URETEROSCOPIC STONE EXTRACTION  06/12/2010  . ROUX-EN-Y GASTRIC BYPASS  2001  . SOLYX TRANSURETHRAL SLING/  POSTERIOR PELVIC FLOOR SACROSPINOUS REPAIR  02/21/2009   and Cysto/  Bilateral ureteral stent placement  . TONSILLECTOMY AND ADENOIDECTOMY    . WRIST GANGLION EXCISION Right 01/28/2000   Social History   Occupational History  . Occupation: disabled   Tobacco Use  . Smoking status: Former Smoker    Packs/day: 0.25    Years: 20.00    Pack years: 5.00    Types: Cigarettes  . Smokeless tobacco: Never Used  Vaping Use  . Vaping Use: Never used  Substance and Sexual  Activity  . Alcohol use: Yes    Comment: occ  . Drug use: No  . Sexual activity: Not on file

## 2020-12-01 DIAGNOSIS — F3181 Bipolar II disorder: Secondary | ICD-10-CM | POA: Diagnosis not present

## 2020-12-02 ENCOUNTER — Other Ambulatory Visit: Payer: Self-pay | Admitting: Internal Medicine

## 2020-12-02 DIAGNOSIS — R234 Changes in skin texture: Secondary | ICD-10-CM

## 2020-12-04 ENCOUNTER — Other Ambulatory Visit: Payer: Self-pay

## 2020-12-04 ENCOUNTER — Ambulatory Visit
Admission: RE | Admit: 2020-12-04 | Discharge: 2020-12-04 | Disposition: A | Discharge status: Home / Self Care | Payer: BC Managed Care – PPO | Source: Ambulatory Visit | Attending: Internal Medicine | Admitting: Internal Medicine

## 2020-12-04 DIAGNOSIS — R234 Changes in skin texture: Secondary | ICD-10-CM

## 2020-12-04 DIAGNOSIS — R922 Inconclusive mammogram: Secondary | ICD-10-CM | POA: Diagnosis not present

## 2020-12-04 DIAGNOSIS — N6489 Other specified disorders of breast: Secondary | ICD-10-CM | POA: Diagnosis not present

## 2020-12-12 DIAGNOSIS — N760 Acute vaginitis: Secondary | ICD-10-CM | POA: Diagnosis not present

## 2020-12-16 DIAGNOSIS — F314 Bipolar disorder, current episode depressed, severe, without psychotic features: Secondary | ICD-10-CM | POA: Diagnosis not present

## 2020-12-18 DIAGNOSIS — F3181 Bipolar II disorder: Secondary | ICD-10-CM | POA: Diagnosis not present

## 2020-12-19 DIAGNOSIS — N760 Acute vaginitis: Secondary | ICD-10-CM | POA: Diagnosis not present

## 2020-12-19 DIAGNOSIS — R82998 Other abnormal findings in urine: Secondary | ICD-10-CM | POA: Diagnosis not present

## 2020-12-19 DIAGNOSIS — E039 Hypothyroidism, unspecified: Secondary | ICD-10-CM | POA: Diagnosis not present

## 2020-12-19 DIAGNOSIS — R5383 Other fatigue: Secondary | ICD-10-CM | POA: Diagnosis not present

## 2020-12-30 DIAGNOSIS — R82998 Other abnormal findings in urine: Secondary | ICD-10-CM | POA: Diagnosis not present

## 2020-12-30 DIAGNOSIS — K719 Toxic liver disease, unspecified: Secondary | ICD-10-CM | POA: Diagnosis not present

## 2020-12-30 DIAGNOSIS — F319 Bipolar disorder, unspecified: Secondary | ICD-10-CM | POA: Diagnosis not present

## 2020-12-30 DIAGNOSIS — R5383 Other fatigue: Secondary | ICD-10-CM | POA: Diagnosis not present

## 2020-12-31 DIAGNOSIS — F319 Bipolar disorder, unspecified: Secondary | ICD-10-CM | POA: Diagnosis not present

## 2020-12-31 DIAGNOSIS — R531 Weakness: Secondary | ICD-10-CM | POA: Diagnosis not present

## 2020-12-31 DIAGNOSIS — G43119 Migraine with aura, intractable, without status migrainosus: Secondary | ICD-10-CM | POA: Diagnosis not present

## 2020-12-31 DIAGNOSIS — R109 Unspecified abdominal pain: Secondary | ICD-10-CM | POA: Diagnosis not present

## 2021-01-02 DIAGNOSIS — R109 Unspecified abdominal pain: Secondary | ICD-10-CM | POA: Diagnosis not present

## 2021-01-07 DIAGNOSIS — G9340 Encephalopathy, unspecified: Secondary | ICD-10-CM | POA: Diagnosis not present

## 2021-01-07 DIAGNOSIS — E039 Hypothyroidism, unspecified: Secondary | ICD-10-CM | POA: Diagnosis not present

## 2021-01-12 ENCOUNTER — Ambulatory Visit
Admission: RE | Admit: 2021-01-12 | Discharge: 2021-01-12 | Disposition: A | Discharge status: Home / Self Care | Payer: BC Managed Care – PPO | Source: Ambulatory Visit | Attending: Internal Medicine | Admitting: Internal Medicine

## 2021-01-12 ENCOUNTER — Other Ambulatory Visit: Payer: Self-pay | Admitting: Internal Medicine

## 2021-01-12 DIAGNOSIS — R410 Disorientation, unspecified: Secondary | ICD-10-CM | POA: Diagnosis not present

## 2021-01-12 DIAGNOSIS — R400 Somnolence: Secondary | ICD-10-CM

## 2021-01-13 DIAGNOSIS — F3181 Bipolar II disorder: Secondary | ICD-10-CM | POA: Diagnosis not present

## 2021-01-14 ENCOUNTER — Other Ambulatory Visit: Payer: Self-pay | Admitting: Internal Medicine

## 2021-01-14 ENCOUNTER — Ambulatory Visit
Admission: RE | Admit: 2021-01-14 | Discharge: 2021-01-14 | Disposition: A | Discharge status: Home / Self Care | Payer: BC Managed Care – PPO | Source: Ambulatory Visit | Attending: Internal Medicine | Admitting: Internal Medicine

## 2021-01-14 DIAGNOSIS — M25461 Effusion, right knee: Secondary | ICD-10-CM | POA: Diagnosis not present

## 2021-01-14 DIAGNOSIS — M25561 Pain in right knee: Secondary | ICD-10-CM

## 2021-01-14 DIAGNOSIS — R159 Full incontinence of feces: Secondary | ICD-10-CM | POA: Diagnosis not present

## 2021-01-14 DIAGNOSIS — R32 Unspecified urinary incontinence: Secondary | ICD-10-CM | POA: Diagnosis not present

## 2021-01-14 DIAGNOSIS — R400 Somnolence: Secondary | ICD-10-CM | POA: Diagnosis not present

## 2021-01-19 ENCOUNTER — Ambulatory Visit: Payer: BC Managed Care – PPO | Admitting: Physician Assistant

## 2021-01-30 DIAGNOSIS — R946 Abnormal results of thyroid function studies: Secondary | ICD-10-CM | POA: Diagnosis not present

## 2021-02-10 DIAGNOSIS — R1310 Dysphagia, unspecified: Secondary | ICD-10-CM | POA: Diagnosis not present

## 2021-02-10 DIAGNOSIS — R0789 Other chest pain: Secondary | ICD-10-CM | POA: Diagnosis not present

## 2021-02-10 DIAGNOSIS — Z8719 Personal history of other diseases of the digestive system: Secondary | ICD-10-CM | POA: Diagnosis not present

## 2021-02-11 DIAGNOSIS — R1319 Other dysphagia: Secondary | ICD-10-CM | POA: Diagnosis not present

## 2021-02-11 DIAGNOSIS — F3181 Bipolar II disorder: Secondary | ICD-10-CM | POA: Diagnosis not present

## 2021-02-11 DIAGNOSIS — K5904 Chronic idiopathic constipation: Secondary | ICD-10-CM | POA: Diagnosis not present

## 2021-02-11 DIAGNOSIS — K719 Toxic liver disease, unspecified: Secondary | ICD-10-CM | POA: Diagnosis not present

## 2021-02-12 ENCOUNTER — Other Ambulatory Visit: Payer: Self-pay | Admitting: Gastroenterology

## 2021-02-12 DIAGNOSIS — R1319 Other dysphagia: Secondary | ICD-10-CM

## 2021-02-18 ENCOUNTER — Other Ambulatory Visit: Payer: Self-pay | Admitting: Gastroenterology

## 2021-02-18 ENCOUNTER — Ambulatory Visit
Admission: RE | Admit: 2021-02-18 | Discharge: 2021-02-18 | Disposition: A | Discharge status: Home / Self Care | Payer: BC Managed Care – PPO | Source: Ambulatory Visit | Attending: Gastroenterology | Admitting: Gastroenterology

## 2021-02-18 DIAGNOSIS — R1319 Other dysphagia: Secondary | ICD-10-CM

## 2021-02-18 DIAGNOSIS — R1310 Dysphagia, unspecified: Secondary | ICD-10-CM | POA: Diagnosis not present

## 2021-02-27 ENCOUNTER — Encounter (HOSPITAL_COMMUNITY): Payer: Self-pay | Admitting: Gastroenterology

## 2021-03-02 ENCOUNTER — Other Ambulatory Visit (HOSPITAL_COMMUNITY)
Admission: RE | Admit: 2021-03-02 | Discharge: 2021-03-02 | Disposition: A | Discharge status: Home / Self Care | Payer: BC Managed Care – PPO | Source: Ambulatory Visit | Attending: Gastroenterology | Admitting: Gastroenterology

## 2021-03-02 DIAGNOSIS — Z20822 Contact with and (suspected) exposure to covid-19: Secondary | ICD-10-CM | POA: Insufficient documentation

## 2021-03-02 DIAGNOSIS — F17210 Nicotine dependence, cigarettes, uncomplicated: Secondary | ICD-10-CM | POA: Diagnosis not present

## 2021-03-02 DIAGNOSIS — Z79890 Hormone replacement therapy (postmenopausal): Secondary | ICD-10-CM | POA: Diagnosis not present

## 2021-03-02 DIAGNOSIS — Z888 Allergy status to other drugs, medicaments and biological substances status: Secondary | ICD-10-CM | POA: Diagnosis not present

## 2021-03-02 DIAGNOSIS — Z885 Allergy status to narcotic agent status: Secondary | ICD-10-CM | POA: Diagnosis not present

## 2021-03-02 DIAGNOSIS — R1310 Dysphagia, unspecified: Secondary | ICD-10-CM | POA: Diagnosis not present

## 2021-03-02 DIAGNOSIS — Z01812 Encounter for preprocedural laboratory examination: Secondary | ICD-10-CM | POA: Insufficient documentation

## 2021-03-02 DIAGNOSIS — Z880 Allergy status to penicillin: Secondary | ICD-10-CM | POA: Diagnosis not present

## 2021-03-02 DIAGNOSIS — Z79899 Other long term (current) drug therapy: Secondary | ICD-10-CM | POA: Diagnosis not present

## 2021-03-02 DIAGNOSIS — K449 Diaphragmatic hernia without obstruction or gangrene: Secondary | ICD-10-CM | POA: Diagnosis not present

## 2021-03-02 DIAGNOSIS — Z9884 Bariatric surgery status: Secondary | ICD-10-CM | POA: Diagnosis not present

## 2021-03-02 DIAGNOSIS — Z882 Allergy status to sulfonamides status: Secondary | ICD-10-CM | POA: Diagnosis not present

## 2021-03-02 DIAGNOSIS — K5904 Chronic idiopathic constipation: Secondary | ICD-10-CM | POA: Diagnosis not present

## 2021-03-02 DIAGNOSIS — K719 Toxic liver disease, unspecified: Secondary | ICD-10-CM | POA: Diagnosis not present

## 2021-03-03 LAB — SARS CORONAVIRUS 2 (TAT 6-24 HRS): SARS Coronavirus 2: NEGATIVE

## 2021-03-05 ENCOUNTER — Encounter (HOSPITAL_COMMUNITY)
Admission: RE | Disposition: A | Discharge status: Home / Self Care | Payer: Self-pay | Source: Ambulatory Visit | Attending: Gastroenterology

## 2021-03-05 ENCOUNTER — Other Ambulatory Visit: Payer: Self-pay

## 2021-03-05 ENCOUNTER — Ambulatory Visit (HOSPITAL_COMMUNITY)
Admission: RE | Admit: 2021-03-05 | Discharge: 2021-03-05 | Disposition: A | Discharge status: Home / Self Care | Payer: BC Managed Care – PPO | Source: Ambulatory Visit | Attending: Gastroenterology | Admitting: Gastroenterology

## 2021-03-05 ENCOUNTER — Ambulatory Visit (HOSPITAL_COMMUNITY): Payer: BC Managed Care – PPO | Admitting: Certified Registered"

## 2021-03-05 ENCOUNTER — Encounter (HOSPITAL_COMMUNITY): Payer: Self-pay | Admitting: Gastroenterology

## 2021-03-05 DIAGNOSIS — K2289 Other specified disease of esophagus: Secondary | ICD-10-CM | POA: Diagnosis not present

## 2021-03-05 DIAGNOSIS — K449 Diaphragmatic hernia without obstruction or gangrene: Secondary | ICD-10-CM | POA: Insufficient documentation

## 2021-03-05 DIAGNOSIS — Z79899 Other long term (current) drug therapy: Secondary | ICD-10-CM | POA: Diagnosis not present

## 2021-03-05 DIAGNOSIS — Z888 Allergy status to other drugs, medicaments and biological substances status: Secondary | ICD-10-CM | POA: Diagnosis not present

## 2021-03-05 DIAGNOSIS — Z20822 Contact with and (suspected) exposure to covid-19: Secondary | ICD-10-CM | POA: Diagnosis not present

## 2021-03-05 DIAGNOSIS — F17210 Nicotine dependence, cigarettes, uncomplicated: Secondary | ICD-10-CM | POA: Diagnosis not present

## 2021-03-05 DIAGNOSIS — Z880 Allergy status to penicillin: Secondary | ICD-10-CM | POA: Diagnosis not present

## 2021-03-05 DIAGNOSIS — E559 Vitamin D deficiency, unspecified: Secondary | ICD-10-CM | POA: Diagnosis not present

## 2021-03-05 DIAGNOSIS — Z885 Allergy status to narcotic agent status: Secondary | ICD-10-CM | POA: Diagnosis not present

## 2021-03-05 DIAGNOSIS — R1310 Dysphagia, unspecified: Secondary | ICD-10-CM | POA: Insufficient documentation

## 2021-03-05 DIAGNOSIS — K5904 Chronic idiopathic constipation: Secondary | ICD-10-CM | POA: Insufficient documentation

## 2021-03-05 DIAGNOSIS — Z9884 Bariatric surgery status: Secondary | ICD-10-CM | POA: Insufficient documentation

## 2021-03-05 DIAGNOSIS — K719 Toxic liver disease, unspecified: Secondary | ICD-10-CM | POA: Insufficient documentation

## 2021-03-05 DIAGNOSIS — K219 Gastro-esophageal reflux disease without esophagitis: Secondary | ICD-10-CM | POA: Diagnosis not present

## 2021-03-05 DIAGNOSIS — K589 Irritable bowel syndrome without diarrhea: Secondary | ICD-10-CM | POA: Diagnosis not present

## 2021-03-05 DIAGNOSIS — Z882 Allergy status to sulfonamides status: Secondary | ICD-10-CM | POA: Diagnosis not present

## 2021-03-05 DIAGNOSIS — Z79890 Hormone replacement therapy (postmenopausal): Secondary | ICD-10-CM | POA: Insufficient documentation

## 2021-03-05 HISTORY — PX: ESOPHAGOGASTRODUODENOSCOPY (EGD) WITH PROPOFOL: SHX5813

## 2021-03-05 HISTORY — PX: BIOPSY: SHX5522

## 2021-03-05 SURGERY — ESOPHAGOGASTRODUODENOSCOPY (EGD) WITH PROPOFOL
Anesthesia: Monitor Anesthesia Care

## 2021-03-05 MED ORDER — SODIUM CHLORIDE 0.9 % IV SOLN: INTRAVENOUS | Status: DC

## 2021-03-05 MED ORDER — MIDAZOLAM HCL 2 MG/2ML IJ SOLN
INTRAMUSCULAR | Status: AC
  Filled 2021-03-05: qty 2

## 2021-03-05 MED ORDER — MIDAZOLAM HCL 5 MG/5ML IJ SOLN
INTRAMUSCULAR | Status: DC | PRN
  Administered 2021-03-05: 2 mg via INTRAVENOUS

## 2021-03-05 MED ORDER — GLYCOPYRROLATE 0.2 MG/ML IJ SOLN
INTRAMUSCULAR | Status: DC | PRN
  Administered 2021-03-05: .2 mg via INTRAVENOUS

## 2021-03-05 MED ORDER — PROPOFOL 500 MG/50ML IV EMUL
INTRAVENOUS | Status: DC | PRN
  Administered 2021-03-05: 135 ug/kg/min via INTRAVENOUS

## 2021-03-05 MED ORDER — ONDANSETRON HCL 4 MG/2ML IJ SOLN
INTRAMUSCULAR | Status: DC | PRN
  Administered 2021-03-05: 4 mg via INTRAVENOUS

## 2021-03-05 MED ORDER — PROPOFOL 500 MG/50ML IV EMUL
INTRAVENOUS | Status: AC
  Filled 2021-03-05: qty 50

## 2021-03-05 MED ORDER — PROPOFOL 500 MG/50ML IV EMUL
INTRAVENOUS | Status: DC | PRN
  Administered 2021-03-05: 50 mg via INTRAVENOUS

## 2021-03-05 MED ORDER — LACTATED RINGERS IV SOLN: INTRAVENOUS | Status: DC | PRN

## 2021-03-05 SURGICAL SUPPLY — 14 items

## 2021-03-05 NOTE — Op Note (Signed)
[facility] Patient Name: Jordan Russell Procedure Date: 03/05/2021 MRN: [id number] Attending MD: Ronnette Juniper , MD Date of Birth: [birth year] CSN: [id number] Age: 50 Admit Type: Outpatient Procedure:                Upper GI endoscopy Indications:              Dysphagia Providers:                Ronnette Juniper, MD, Benay Pillow, RN, Laverda Sorenson,                            Technician, Arnoldo Hooker, CRNA Referring MD:             Honor Junes Medicines:                Monitored Anesthesia Care Complications:            No immediate complications. Estimated blood loss:                            Minimal. Estimated Blood Loss:     Estimated blood loss was minimal. Procedure:                Pre-Anesthesia Assessment:                           - Prior to the procedure, a History and Physical                            was performed, and patient medications and                            allergies were reviewed. The patient's tolerance of                            previous anesthesia was also reviewed. The risks                            and benefits of the procedure and the sedation                            options and risks were discussed with the patient.                            All questions were answered, and informed consent                            was obtained. Prior Anticoagulants: The patient has                            taken no previous anticoagulant or antiplatelet                            agents. ASA Grade Assessment: III - A patient with  severe systemic disease. After reviewing the risks                            and benefits, the patient was deemed in                            satisfactory condition to undergo the procedure.                           After obtaining informed consent, the endoscope was                            passed under direct vision. Throughout the                            procedure, the  patient's blood pressure, pulse, and                            oxygen saturations were monitored continuously. The                            [id number]) Olympus gastroscope was                            introduced through the mouth, and advanced to the                            jejunum. The upper GI endoscopy was accomplished                            without difficulty. The patient tolerated the                            procedure well. Scope In: Scope Out: Findings:      The upper third of the esophagus, middle third of the esophagus and       lower third of the esophagus were normal. Biopsies were obtained from       the proximal and distal esophagus with cold forceps for histology of       suspected eosinophilic esophagitis.      The Z-line was regular and was found 30 cm from the incisors.      A 5 cm hiatal hernia was present.      Evidence of a gastric bypass was found. A gastric pouch with a 5 cm       length from the mouth of hiatal hernia to the gastrojejunal anastomosis       was found(total 10 cm length of gastric pouch, including hiatal hernia).       The staple line appeared intact. The gastrojejunal anastomosis was       characterized by healthy appearing mucosa. This was traversed. The       pouch-to-jejunum limb was characterized by healthy appearing mucosa.      The cardia and gastric fundus were normal on retroflexion.      The examined jejunum was normal. Impression:               -  Normal upper third of esophagus, middle third of                            esophagus and lower third of esophagus. Biopsied.                           - Z-line regular, 30 cm from the incisors.                           - 5 cm hiatal hernia.                           - Gastric bypass with a pouch, total 10 cm in                            length( 5cm from opening of hiatal hernia to                            gastrojejunal anastomosis), characterized by                             healthy appearing mucosa.                           - Normal examined jejunum. Moderate Sedation:      Patient did not receive moderate sedation for this procedure, but       instead received monitored anesthesia care. Recommendation:           - Patient has a contact number available for                            emergencies. The signs and symptoms of potential                            delayed complications were discussed with the                            patient. Return to normal activities tomorrow.                            Written discharge instructions were provided to the                            patient.                           - Resume regular diet.                           - Continue present medications.                           - Await pathology results. Procedure Code(s):        --- Professional ---  81275, Esophagogastroduodenoscopy, flexible,                            transoral; with biopsy, single or multiple Diagnosis Code(s):        --- Professional ---                           K44.9, Diaphragmatic hernia without obstruction or                            gangrene                           Z98.84, Bariatric surgery status                           R13.10, Dysphagia, unspecified CPT copyright 2019 American Medical Association. All rights reserved. The codes documented in this report are preliminary and upon coder review may  be revised to meet current compliance requirements. Ronnette Juniper, MD 03/05/2021 2:14:32 PM This report has been signed electronically. Number of Addenda: 0

## 2021-03-05 NOTE — Anesthesia Preprocedure Evaluation (Signed)
Anesthesia Evaluation  Patient identified by MRN, date of birth, ID band Patient awake    Reviewed: Allergy & Precautions, NPO status , Patient's Chart, lab work & pertinent test results  History of Anesthesia Complications (+) PONV and history of anesthetic complications  Airway Mallampati: I  TM Distance: >3 FB Neck ROM: Full    Dental  (+) Teeth Intact   Pulmonary asthma , Current Smoker,    breath sounds clear to auscultation       Cardiovascular  Rhythm:Regular Rate:Bradycardia     Neuro/Psych  Headaches, PSYCHIATRIC DISORDERS Anxiety Bipolar Disorder    GI/Hepatic Neg liver ROS, hiatal hernia, GERD  ,  Endo/Other  negative endocrine ROS  Renal/GU      Musculoskeletal  (+) Arthritis , Rheumatoid disorders,  Fibromyalgia -  Abdominal Normal abdominal exam  (+)   Peds  Hematology negative hematology ROS (+)   Anesthesia Other Findings   Reproductive/Obstetrics                             Anesthesia Physical Anesthesia Plan  ASA: II  Anesthesia Plan: MAC   Post-op Pain Management:    Induction: Intravenous  PONV Risk Score and Plan: 0 and Propofol infusion  Airway Management Planned: Natural Airway and Nasal Cannula  Additional Equipment: None  Intra-op Plan:   Post-operative Plan:   Informed Consent: I have reviewed the patients History and Physical, chart, labs and discussed the procedure including the risks, benefits and alternatives for the proposed anesthesia with the patient or authorized representative who has indicated his/her understanding and acceptance.       Plan Discussed with: CRNA  Anesthesia Plan Comments:         Anesthesia Quick Evaluation

## 2021-03-05 NOTE — H&P (Signed)
History of Present Illness  General:          50 year old female        history of drug induced liver injury, on ursodiol, furosemide, spironolactone, lactulose, folate and vitamin B12        multiple prior G.I. history: Roux-en-Y gastric bypass the 2001, chronic idiopathic constipation, IBS with diarrhea and constipation        colonoscopy from 4/21 with 2 tubular adenomas removed,internal hemorrhoids        EGD from 4/21 with normal biopsies of esophagus, esophagitis, Schatzki's ring dilated with 20 mm balloon, 4cm hiatal hernia, Roux-en-Y gastrojejunostomy        on her last visit I recommend her to stop lactulose and continue Xifaxan because of abdominal pain        hepatic panel from 01/07/21 showed ALP of 130, otherwise normal TB, AST, AST, normal TSH, normal lithium level, normal plasma ammonia, CBC from 2/22 was unremarkable, CMP was unremarkable.        She states she cannot eat or swallow, she initially thought she had a "heart problem" but was told she has a normal rate and rhythm.        She feels that her throat is closed up and food gets stuck and it takes "hours and hours" after a bite to go down or comes up. This has been ongoing for a long time but at this time she goes 3-4 days without a solid food, it makes her dizzy and weak.        She gets "hiccup like sensation", she gets squeezing pain and a "weird noise in her mouth", she feels it is a tight air and occurs every time she eats.        She has lost 16 lbs in the past 1.5 months.        It occurs always with solids and sometimes wit liquids, at times liquids shoots of her nose.        She has been taking linzess 290 mcg once a day and has a daily BM, denies blood in stool or black stools.        She has infrequent abdominal pain and takes dicyclomine three times a day.        2 weeks ago she was in agonizing pain and later found that she had kidney stones(she heard it hit the toilet).     Current Medications  Taking   .Esomeprazole Magnesium 40 MG Capsule Delayed Release 1 capsule Orally twice a day  .Sucralfate 1 GM Tablet 1 tablet on an empty stomach Orally Twice a day  Folic Acid 1 MG Tablet TAKE 1 TABLET BY MOUTH EVERY DAY   .Levothyroxine Sodium 75 MCG Tablet TAKE 1 TABLET BY MOUTH EVERY DAY IN THE MORNING ON AN EMPTY STOMACH   .Xifaxan(rifAXIMin) 550 MG Tablet TAKE 1 TABLET BY MOUTH TWICE DAILY   .Dicyclomine HCl 20 MG Tablet 1 tablet Orally Three times a day as needed  .Furosemide 20 MG Tablet 1 tablet Orally Once a day  .Lexapro(Escitalopram Oxalate) 10 MG Tablet 1 tablet Orally twice a day  .Linzess(linaCLOtide) 290 MCG Capsule 1 capsule at least 30 minutes before the first meal of the day on an empty stomach Orally Once a day  .Ondansetron 8 MG Tablet Disintegrating DISSOLVE ONE TABLET BY MOUTH EVERY 6 HOURS AS NEEDED FOR NAUSEA AND VOMITING   .Rizatriptan Benzoate 10 MG Tablet 1 tablet Orally once a day if needed  .Spironolactone 50 MG  Tablet TAKE 1 TABLET BY MOUTH EVERY DAY WITH FOOD   .Topiramate 25 MG Tablet TAKE 1 TABLET BY MOUTH TWICE DAILY   .Ursodiol 500 MG Tablet 1 tablet Orally Twice a day  .Ziprasidone HCl 80 MG Capsule 1 capsule with food Orally Twice a day  .ALPRAZolam 0.5 MG Tablet 1 tablet Orally three times a day  .Lithium Carbonate 300 MG Capsule 1 capsule Orally twice a day  .traZODone HCl 100 MG Tablet 2 tablets Orally Once a day at bedtime    Past Medical History       Bipolar - Dr. Chucky May.       Arthritis.       Nephrolithiasis-likely related to hyperparathyroid.       Hyperparathyroidism-s/p removal of 2 parathyroids 10/11.       Hidradenitis Suppurativa.       vitamin D deficiency.       Fibromyalgia.       severe GERD with dysphagia, October, 2015.       Weak esophageal peristalsis on manometry, 2016.       Sinusitis treated medically with doxycycline, prednisone taper, and yeast prophylaxis with fluconazole.       right greater trochanteric bursitis and  hand cellulitis, May, 2016 walk-in clinic.       sinusitis treated in the [facility] with clindamycin and antihistamine decongestant, June, 2016.       upper endoscopy revealed jejunitis, gastric Roux-en-Y anastomosis, and small hiatal hernia - May, 2016 - Dr. Roney Mans [facility].       "Cutter" behaviors.       Right lower extremity venous insufficiency which started after several knee surgeries in the past.       severe gag reflex - hiatal hernia with diverticulum - vomiting caused when diverticulum filled with contrast, Dr. Virgia Land - recommended 6 small meals daily - July 08, 2015.       rheumatoid arthritis, rheumatoid factor negative, presented as polyarthralgias, Dr. Gavin Pound, July 25, 2015 [facility] [place], October, 2016 - 04/2017 - injection for left hip bursitis, August, 2018 - last OV 04/2018.       hospitalized for Lamictal toxicity, May, 2017.       severe constipation -Dr. Shary Key - GI , [place], [place].       Depression/anxiety.       Dr. Therisa Doyne - dysphagia/chronic idiopathic constipation/early satiety/N&V/elevated alkaline phosphatase- 01/2017 - erythromycin 500 milligrams daily for chronic constipation and ? gastroparesis - last OV 07/2020 - stop lactulose.       Ectatic esophagus - 2018.       B12 deficiency with B12 level 174, 04/01/2017 - started replacement therapy April 05, 2017.       [facility] visit for possible purpura May 06, 2017.       irritable bowel syndrome alternating with constipation diarrhea, Dr.Trenise Turay, 06/2017 - [facility] - 12/2017 - last OV 06/29/2018.       ER visit, September 07, 2017, workup for brief a CHF and gait problem, MRI negative for stroke.       Left Hip trochanteric burstis - Dr. Kathrynn Speed -09/2017.       Hypothyroidism .       Kidney stone.       leg weakness - neuro eval in [place] in 03/2018.       ? of memory loss - Dr. Tommas Olp - 01/2018 - neuro eval - chk mRI and neuropscyh testing - 05/23/2018.  hospital  admission for lactic acidosis and fatty liver with hepatic dysfunction and focal neurologic deficit - 06/04/2018 - transferred to [facility].       Right Meralgia paresthetica and right common peroneal neuropathy Dr. Narda Amber, February, 2020 - 06/2019.       Enlarged spleen .      Surgical History        Gastric Bypass - Roux-en-Y 2001        Hysterectomy         Vaginal Sling         Vaginal Insertion of mesh for pelvic floor repair         Posterior colporrhaphy for pelvic relaxation         Sacrospinous ligament fixation for posthysterectomy prolapse         removal of 2 of her parathyroid glands         right knee surgery, recurrent causing right lower extremity venous insufficiency         Liver biopsy 2019        CT scan of abd 01/2020        Colonoscopy 01/2020        Endoscopy 01/2020    Family History  Father: alive 25 yrs, spondylarthritis-psoriatic arthritis, kidney stones - thought to be uric acid stones secondary to medications  Mother: alive 3 yrs, Graves, (Genetic carrier for colon cancer), diagnosed with Breast cancer  Paternal [place] Father: deceased, Diabetes Mellitus, polyp, diagnosed with Diabetes  Paternal Grand Mother: deceased, Diabetes Mellitus, Diabetes Mellitus, polyp, diagnosed with Diabetes  Maternal Grand Father: deceased, Unknown  Maternal Grand Mother: deceased, Breast Cancer, diagnosed with Breast cancer  Brother 1: alive 67 yrs, No known medical problems  Sister 1: alive, No known medical problems  Paternal aunt: alive, small tumor in small intestine  1 brother(s) , 1 sister(s) .   Maternal aunt and mother - breast cancer., No Family History of Colon Cancer or Liver Disease.      Social History  General:   Tobacco use       cigarettes:  Current smoker     Frequency:  1/2 PPD     Tobacco history last updated  02/11/2021 EXPOSURE TO PASSIVE SMOKE: yes.  no Alcohol.  Caffeine: yes, 1-2 servings per day.  no Recreational drug use.  no  Exercise.  Marital Status: married.  Children: 1.  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  Smoking: yes.      Allergies  Penicillin (for allergy): hives - Side Effects  Morphine Sulfate: hives - Side Effects  Sulfa Drugs: hives - Side Effects  Lamictal: tremors - Side Effects  Gabapentin: leg swelling - Side Effects     Hospitalization/Major Diagnostic Procedure  Childbirth x 1   Lamictal toxicity 03/2016  liver failure 06-2018  Not in the past year, no recent ER visits 01/2021    Review of Systems  GI PROCEDURE:          Pacemaker/ AICD no.  Artificial heart valves no.  MI/heart attack no.  Abnormal heart rhythm no.  Angina YES.  CVA no.  Hypertension no.  Hypotension YES.  Asthma, COPD no.  Sleep apnea no.  Seizure disorders no.  Artificial joints no.  Severe DJD no.  Diabetes no.  Significant headaches YES.  Vertigo no.  Depression/anxiety YES.  Abnormal bleeding no.  Kidney Disease no.  Liver disease YES.  Chance of pregnancy no.  Blood transfusion no.  Vital Signs  Wt 181.9, Wt change -.3 lb, Ht 66.5, BMI 28.92, Temp 97.9, Pulse sitting 56, BP sitting 109/77.   Examination  Gastroenterology::        GENERAL APPEARANCE: Well developed, well nourished, no active distress, pleasant.         SCLERA: anicteric.         CARDIOVASCULAR Normal RRR .         RESPIRATORY Breath sounds normal. Respiration even and unlabored.         ABDOMEN No masses palpated. Liver and spleen not palpated, normal. Bowel sounds normal, Abdomen not distended.         EXTREMITIES: No edema.         NEURO: alert, oriented to time, place and person, normal gait.         PSYCH: mood/affect normal.    Assessments   1. Esophageal dysphagia - R13.19 (Primary)    2. Drug-induced liver injury - K71.9    3. Chronic idiopathic constipation - K59.04    Treatment   1. Esophageal dysphagia         IMAGING: Barium : Swallow with pill       IMAGING: EGD w/ DILATATION Notes: Will  start wtih barium swallow with a piill, to identify if the patient has dysphagia from dysmotility or has a true stricture. If a stricture is present, will set up for EGD with diation at [facility]. Continue esopmeprazole and sucralfate for now.      2. Drug-induced liver injury   Notes: Continue furosemide and spironolactone for history of edema. Continue xifaxan for history of hepatic encephalopathy.      3. Chronic idiopathic constipation   Notes: Continue linzess 290 mcg a day.

## 2021-03-05 NOTE — Anesthesia Postprocedure Evaluation (Signed)
Anesthesia Post Note  Patient: Jordan Russell  Procedure(s) Performed: ESOPHAGOGASTRODUODENOSCOPY (EGD) WITH PROPOFOL (N/A ) BIOPSY     Patient location during evaluation: PACU Anesthesia Type: MAC Level of consciousness: awake and alert Pain management: pain level controlled Vital Signs Assessment: post-procedure vital signs reviewed and stable Respiratory status: spontaneous breathing, nonlabored ventilation, respiratory function stable and patient connected to nasal cannula oxygen Cardiovascular status: stable and blood pressure returned to baseline Postop Assessment: no apparent nausea or vomiting Anesthetic complications: no   No complications documented.  Last Vitals:  Vitals:   03/05/21 1425 03/05/21 1440  BP: 107/61 (!) 103/53  Pulse: 60 (!) 57  Resp: 20 11  Temp:    SpO2: 99% 99%    Last Pain:  Vitals:   03/05/21 1410  TempSrc: Oral  PainSc: 0-No pain                 Effie Berkshire

## 2021-03-05 NOTE — Discharge Instructions (Signed)
YOU HAD AN ENDOSCOPIC PROCEDURE TODAY: Refer to the procedure report and other information in the discharge instructions given to you for any specific questions about what was found during the examination. If this information does not answer your questions, please call [place] office at [phone] to clarify.   YOU SHOULD EXPECT: Some feelings of bloating in the abdomen. Passage of more gas than usual. Walking can help get rid of the air that was put into your GI tract during the procedure and reduce the bloating. If you had a lower endoscopy (such as a colonoscopy or flexible sigmoidoscopy) you may notice spotting of blood in your stool or on the toilet paper. Some abdominal soreness may be present for a day or two, also.  DIET: Your first meal following the procedure should be a light meal and then it is ok to progress to your normal diet. A half-sandwich or bowl of soup is an example of a good first meal. Heavy or fried foods are harder to digest and may make you feel nauseous or bloated. Drink plenty of fluids but you should avoid alcoholic beverages for 24 hours. If you had a esophageal dilation, please see attached instructions for diet.    ACTIVITY: Your care partner should take you home directly after the procedure. You should plan to take it easy, moving slowly for the rest of the day. You can resume normal activity the day after the procedure however YOU SHOULD NOT DRIVE, use power tools, machinery or perform tasks that involve climbing or major physical exertion for 24 hours (because of the sedation medicines used during the test).   SYMPTOMS TO REPORT IMMEDIATELY: A gastroenterologist can be reached at any hour. Please call [phone]  for any of the following symptoms:   Following upper endoscopy (EGD, EUS, ERCP, esophageal dilation) Vomiting of blood or coffee ground material  New, significant abdominal pain  New, significant chest pain or pain under the shoulder blades  Painful or  persistently difficult swallowing  New shortness of breath  Black, tarry-looking or red, bloody stools  FOLLOW UP:  If any biopsies were taken you will be contacted by phone or by letter within the next 1-3 weeks. Call [phone]  if you have not heard about the biopsies in 3 weeks.  Please also call with any specific questions about appointments or follow up tests.  

## 2021-03-05 NOTE — Transfer of Care (Signed)
Immediate Anesthesia Transfer of Care Note  Patient: Jordan Russell  Procedure(s) Performed: Procedure(s): ESOPHAGOGASTRODUODENOSCOPY (EGD) WITH PROPOFOL (N/A) BIOPSY  Patient Location: PACU  Anesthesia Type:MAC  Level of Consciousness:  sedated, patient cooperative and responds to stimulation  Airway & Oxygen Therapy:Patient Spontanous Breathing and Patient connected to face mask oxgen  Post-op Assessment:  Report given to PACU RN and Post -op Vital signs reviewed and stable  Post vital signs:  Reviewed and stable  Last Vitals:  Vitals:   03/05/21 1258  BP: (!) 106/58  Pulse: (!) 58  Resp: 13  Temp: 37.1 C  SpO2: 28%    Complications: No apparent anesthesia complications

## 2021-03-05 NOTE — Brief Op Note (Signed)
03/05/2021  2:16 PM  PATIENT:  [name]  50 y.o. female  PRE-OPERATIVE DIAGNOSIS:  Esophageal dysphagia  POST-OPERATIVE DIAGNOSIS:  5cm hiatal hernia, 5cm gastric pouch, esophageal biopsies r/o EoE  PROCEDURE:  Procedure(s): ESOPHAGOGASTRODUODENOSCOPY (EGD) WITH PROPOFOL (N/A) BIOPSY  SURGEON:  Surgeon(s) and Role:    Ronnette Juniper, MD - Primary  PHYSICIAN ASSISTANT:   ASSISTANTS: Vonzell Schlatter ANESTHESIA:   MAC  EBL:  Minimal  BLOOD ADMINISTERED:none  DRAINS: none   LOCAL MEDICATIONS USED:  NONE  SPECIMEN:  Biopsy / Limited Resection  DISPOSITION OF SPECIMEN:  PATHOLOGY  COUNTS:  YES  TOURNIQUET:  * No tourniquets in log *  DICTATION: .Dragon Dictation  PLAN OF CARE: Discharge to home after PACU  PATIENT DISPOSITION:  PACU - hemodynamically stable.   Delay start of Pharmacological VTE agent (>24hrs) due to surgical blood loss or risk of bleeding: not applicable

## 2021-03-06 ENCOUNTER — Encounter (HOSPITAL_COMMUNITY): Payer: Self-pay | Admitting: Gastroenterology

## 2021-03-06 LAB — SURGICAL PATHOLOGY

## 2021-03-11 DIAGNOSIS — F3181 Bipolar II disorder: Secondary | ICD-10-CM | POA: Diagnosis not present

## 2021-03-18 ENCOUNTER — Ambulatory Visit (INDEPENDENT_AMBULATORY_CARE_PROVIDER_SITE_OTHER): Payer: BC Managed Care – PPO | Admitting: Physician Assistant

## 2021-03-18 ENCOUNTER — Encounter: Payer: Self-pay | Admitting: Physician Assistant

## 2021-03-18 DIAGNOSIS — M7062 Trochanteric bursitis, left hip: Secondary | ICD-10-CM

## 2021-03-18 DIAGNOSIS — M7061 Trochanteric bursitis, right hip: Secondary | ICD-10-CM

## 2021-03-18 MED ORDER — METHYLPREDNISOLONE ACETATE 40 MG/ML IJ SUSP
40.0000 mg | INTRAMUSCULAR | Status: AC | PRN
  Administered 2021-03-18: 40 mg via INTRA_ARTICULAR

## 2021-03-18 MED ORDER — LIDOCAINE HCL 1 % IJ SOLN
0.5000 mL | INTRAMUSCULAR | Status: AC | PRN
  Administered 2021-03-18: .5 mL

## 2021-03-18 NOTE — Progress Notes (Signed)
   Procedure Note  Patient: Jordan Russell             Date of Birth: [birth year]           MRN: [id number]             Visit Date: 03/18/2021  HPI: Mrs. Eisenhart returns today for bilateral hip trochanteric bursitis.  She states the injections on 11/26/2020 helped a lot.  Pain is just starting to slowly come back.  She had no new injury.  She did denies any numbness tingling down either leg outside of the neuropathy that she has in the right calf and foot which is chronic.  She continues to work on IT band stretching.  Review of systems: Negative for fevers, chills or recent vaccines.  Physical exam: General well-developed well-nourished female no acute distress mood and affect appropriate.  Ambulates without any assistive device is able to get on and off the exam table on her own.  Bilateral hips: Good range of motion both hips she has tenderness over the trochanteric region both hips no rashes skin lesions ulcerations over the trochanteric region of either hip.  Procedures: Visit Diagnoses:  1. Trochanteric bursitis of left hip   2. Trochanteric bursitis, right hip     Large Joint Inj: bilateral greater trochanter on 03/18/2021 9:50 AM Indications: pain Details: 22 G 1.5 in needle, lateral approach  Arthrogram: No  Medications (Right): 0.5 mL lidocaine 1 %; 40 mg methylPREDNISolone acetate 40 MG/ML Medications (Left): 0.5 mL lidocaine 1 %; 40 mg methylPREDNISolone acetate 40 MG/ML Outcome: tolerated well, no immediate complications Procedure, treatment alternatives, risks and benefits explained, specific risks discussed. Consent was given by the patient. Immediately prior to procedure a time out was called to verify the correct patient, procedure, equipment, support staff and site/side marked as required. Patient was prepped and draped in the usual sterile fashion.     Plan: She will continue work on IT band stretching exercises.  She knows to wait at least 3 months between  injections.  Follow-up as needed.

## 2021-04-03 DIAGNOSIS — K449 Diaphragmatic hernia without obstruction or gangrene: Secondary | ICD-10-CM | POA: Diagnosis not present

## 2021-04-03 DIAGNOSIS — Z8719 Personal history of other diseases of the digestive system: Secondary | ICD-10-CM | POA: Diagnosis not present

## 2021-04-03 DIAGNOSIS — K912 Postsurgical malabsorption, not elsewhere classified: Secondary | ICD-10-CM | POA: Diagnosis not present

## 2021-04-03 DIAGNOSIS — K224 Dyskinesia of esophagus: Secondary | ICD-10-CM | POA: Diagnosis not present

## 2021-04-03 DIAGNOSIS — Z9884 Bariatric surgery status: Secondary | ICD-10-CM | POA: Diagnosis not present

## 2021-04-07 DIAGNOSIS — R197 Diarrhea, unspecified: Secondary | ICD-10-CM | POA: Diagnosis not present

## 2021-04-09 ENCOUNTER — Other Ambulatory Visit: Payer: Self-pay | Admitting: General Surgery

## 2021-04-09 DIAGNOSIS — K449 Diaphragmatic hernia without obstruction or gangrene: Secondary | ICD-10-CM

## 2021-04-24 ENCOUNTER — Other Ambulatory Visit: Payer: BC Managed Care – PPO

## 2021-04-29 ENCOUNTER — Encounter (HOSPITAL_BASED_OUTPATIENT_CLINIC_OR_DEPARTMENT_OTHER): Payer: Self-pay

## 2021-04-29 ENCOUNTER — Other Ambulatory Visit: Payer: Self-pay

## 2021-04-29 ENCOUNTER — Emergency Department (HOSPITAL_BASED_OUTPATIENT_CLINIC_OR_DEPARTMENT_OTHER): Payer: BC Managed Care – PPO

## 2021-04-29 ENCOUNTER — Emergency Department (HOSPITAL_BASED_OUTPATIENT_CLINIC_OR_DEPARTMENT_OTHER)
Admission: EM | Admit: 2021-04-29 | Discharge: 2021-04-29 | Disposition: A | Discharge status: Home / Self Care | Payer: BC Managed Care – PPO | Attending: Emergency Medicine | Admitting: Emergency Medicine

## 2021-04-29 DIAGNOSIS — S0003XA Contusion of scalp, initial encounter: Secondary | ICD-10-CM | POA: Diagnosis not present

## 2021-04-29 DIAGNOSIS — W19XXXA Unspecified fall, initial encounter: Secondary | ICD-10-CM

## 2021-04-29 DIAGNOSIS — S0101XA Laceration without foreign body of scalp, initial encounter: Secondary | ICD-10-CM | POA: Diagnosis not present

## 2021-04-29 DIAGNOSIS — J45909 Unspecified asthma, uncomplicated: Secondary | ICD-10-CM | POA: Diagnosis not present

## 2021-04-29 DIAGNOSIS — R001 Bradycardia, unspecified: Secondary | ICD-10-CM | POA: Diagnosis not present

## 2021-04-29 DIAGNOSIS — F17210 Nicotine dependence, cigarettes, uncomplicated: Secondary | ICD-10-CM | POA: Diagnosis not present

## 2021-04-29 DIAGNOSIS — W01190A Fall on same level from slipping, tripping and stumbling with subsequent striking against furniture, initial encounter: Secondary | ICD-10-CM | POA: Insufficient documentation

## 2021-04-29 DIAGNOSIS — S060X0A Concussion without loss of consciousness, initial encounter: Secondary | ICD-10-CM | POA: Diagnosis not present

## 2021-04-29 DIAGNOSIS — R42 Dizziness and giddiness: Secondary | ICD-10-CM | POA: Diagnosis not present

## 2021-04-29 DIAGNOSIS — S0990XA Unspecified injury of head, initial encounter: Secondary | ICD-10-CM | POA: Diagnosis not present

## 2021-04-29 LAB — COMPREHENSIVE METABOLIC PANEL
ALT: 16 U/L (ref 0–44)
AST: 18 U/L (ref 15–41)
Albumin: 3.6 g/dL (ref 3.5–5.0)
Alkaline Phosphatase: 113 U/L (ref 38–126)
Anion gap: 5 (ref 5–15)
BUN: 9 mg/dL (ref 6–20)
CO2: 24 mmol/L (ref 22–32)
Calcium: 8.9 mg/dL (ref 8.9–10.3)
Chloride: 111 mmol/L (ref 98–111)
Creatinine, Ser: 0.7 mg/dL (ref 0.44–1.00)
GFR, Estimated: >60 mL/min (ref >60)
Glucose, Bld: 95 mg/dL (ref 70–99)
Potassium: 3.1 mmol/L — ABNORMAL LOW (ref 3.5–5.1)
Sodium: 140 mmol/L (ref 135–145)
Total Bilirubin: 0.4 mg/dL (ref 0.3–1.2)
Total Protein: 5.5 g/dL — ABNORMAL LOW (ref 6.5–8.1)

## 2021-04-29 LAB — CBC WITH DIFFERENTIAL/PLATELET
Abs Immature Granulocytes: 0.02 10*3/uL (ref 0.00–0.07)
Basophils Absolute: 0.1 10*3/uL (ref 0.0–0.1)
Basophils Relative: 1 %
Eosinophils Absolute: 0.4 10*3/uL (ref 0.0–0.5)
Eosinophils Relative: 5 %
HCT: 36.2 % (ref 36.0–46.0)
Hemoglobin: 12.7 g/dL (ref 12.0–15.0)
Immature Granulocytes: 0 %
Lymphocytes Relative: 27 %
Lymphs Abs: 2.3 10*3/uL (ref 0.7–4.0)
MCH: 34.2 pg — ABNORMAL HIGH (ref 26.0–34.0)
MCHC: 35.1 g/dL (ref 30.0–36.0)
MCV: 97.6 fL (ref 80.0–100.0)
Monocytes Absolute: 0.5 10*3/uL (ref 0.1–1.0)
Monocytes Relative: 6 %
Neutro Abs: 5.3 10*3/uL (ref 1.7–7.7)
Neutrophils Relative %: 61 %
Platelets: 243 10*3/uL (ref 150–400)
RBC: 3.71 MIL/uL — ABNORMAL LOW (ref 3.87–5.11)
RDW: 14.7 % (ref 11.5–15.5)
WBC: 8.5 10*3/uL (ref 4.0–10.5)
nRBC: 0 % (ref 0.0–0.2)

## 2021-04-29 MED ORDER — POTASSIUM CHLORIDE CRYS ER 20 MEQ PO TBCR
40.0000 meq | EXTENDED_RELEASE_TABLET | Freq: Once | ORAL | Status: AC
  Administered 2021-04-29: 40 meq via ORAL
  Filled 2021-04-29: qty 2

## 2021-04-29 MED ORDER — FENTANYL CITRATE (PF) 100 MCG/2ML IJ SOLN
50.0000 ug | Freq: Once | INTRAMUSCULAR | Status: AC
  Administered 2021-04-29: 50 ug via INTRAVENOUS
  Filled 2021-04-29: qty 2

## 2021-04-29 NOTE — ED Triage Notes (Signed)
Pt states she got dizzy and fell. Pt struck her head against a drawer. Pt with hematoma and lac to the back of her head. Pt states she is also feeling tightness in her chest that has since resolved. Pt states she falls a lot.

## 2021-04-29 NOTE — ED Provider Notes (Signed)
[facility] EMERGENCY DEPARTMENT Provider Note   CSN: [id number] Arrival date & time: 04/29/21  0200     History Chief Complaint  Patient presents with   Lytle Michaels    Jordan Russell is a 50 y.o. female.   Fall   Patient presents to the ED for evaluation after a fall where she struck her head against a drawer.  Patient has history of several medical problems.  She does have previous history of liver failure and has had issues with her gait and balance ever since then.  Patient states she was getting up and turning when she became unsteady and fell striking her head on a drawer.  She did not lose consciousness but did feel dizzy and felt like her vision was affected after that fall.  She does have mild headache as well and has noted swelling in the back of her head.  Patient did sustain a laceration and was concerned she might need sutures so that is why she came to the ED.  She denies any fevers or chills.  No numbness or weakness.  Past Medical History:  Diagnosis Date   Asthma    Bipolar 1 disorder ([place])    Fibromyalgia    GAD (generalized anxiety disorder)    Gastroparesis    GERD (gastroesophageal reflux disease)    Hiatal hernia    History of kidney stones    History of primary hyperparathyroidism    s/p  bilateral inferior parathyroidectomy 08-03-2010   IBS (irritable bowel syndrome)    Left ureteral stone    Migraine    PONV (postoperative nausea and vomiting)    and claustrophobic with mask   RA (rheumatoid arthritis) ([place])    dr Gavin Pound-- rheumtologist   Self mutilating behavior    Thyroid disease    Urgency of urination    Wears glasses     Patient Active Problem List   Diagnosis Date Noted   HCAP (healthcare-associated pneumonia) 06/09/2018   Colitis    Encephalopathy, hepatic ([place])    Acute liver failure without hepatic coma    E coli bacteremia    Palliative care encounter    Severe malnutrition ([place])    Drug-induced liver injury  06/08/2018   Elevated LFTs 06/08/2018   Hyperammonemia ([place]) 06/08/2018   Anasarca 06/08/2018   Acute encephalopathy 06/05/2018   Hypoxia 06/05/2018   Hyperbilirubinemia 06/05/2018   Fatty infiltration of liver 06/05/2018   AKI (acute kidney injury) ([place]) 06/05/2018   Protein-calorie malnutrition, severe 06/05/2018   Severe sepsis ([place]) 06/04/2018   Trochanteric bursitis, left hip 09/08/2017   Pain in left hip 09/08/2017   Asthma with acute exacerbation 12/17/2016   Carpal tunnel syndrome of left wrist 12/17/2016   Dysphagia, pharyngoesophageal phase 12/17/2016   Fibromyalgia 12/17/2016   H/O vitamin D deficiency 12/17/2016   Hidradenitis suppurativa 12/17/2016   Calculus of kidney 12/17/2016   Hypoglycemia 12/17/2016   Hyperglycemia 12/17/2016   Hydronephrosis with renal and ureteral calculus obstruction 12/17/2016   Hydradenitis 12/17/2016   Hx of iron deficiency anemia 12/17/2016   HPTH (hyperparathyroidism) ([place]) 12/17/2016   Nocturia 12/17/2016   Intractable migraine with aura without status migrainosus 12/17/2016   Pain in left knee 12/17/2016   Arthralgia of multiple joints 12/17/2016   Avitaminosis D 12/17/2016   Anemia, iron deficiency 12/17/2016   Cutaneous eruption 12/17/2016   Pain in joint 12/17/2016   Varicose veins of both legs with edema 06/28/2016   Adverse effects of medication 03/10/2016  Bipolar disorder ([place]) 03/10/2016   Rheumatoid arthritis ([place]) 03/10/2016   Migraine 03/10/2016   IBS (irritable bowel syndrome) 03/10/2016   Anxiety 03/10/2016   Asthma in adult 03/10/2016   GERD (gastroesophageal reflux disease) 03/10/2016   Epigastric pain 06/04/2015   Pharyngoesophageal dysphagia 02/10/2015   S/P right knee arthroscopy 04/16/2014   Chondromalacia of knee 02/26/2014   Knee pain 02/20/2014   Bilateral knee pain 02/05/2014   Vitamin D deficiency 12/14/2013   S/P gastric bypass 12/14/2013    Past Surgical History:  Procedure Laterality Date    ABDOMINAL HYSTERECTOMY     BALLOON DILATION N/A 10/01/2014   Procedure: BALLOON DILATION;  Surgeon: Garlan Fair, MD;  Location: [facility];  Service: Endoscopy;  Laterality: N/A;   BIOPSY  03/05/2021   Procedure: BIOPSY;  Surgeon: Ronnette Juniper, MD;  Location: [facility];  Service: Gastroenterology;;   CYSTO/  BILATERAL RETROGRADE PYELOGRAM  11/20/2000   CYSTO/  RIGHT RETROGRADE PYELOGRAM/  RIGHT URETEROSCOPY/  STENT PLACEMENT  12/16/2006   CYSTOSCOPY/URETEROSCOPY/HOLMIUM LASER/STENT PLACEMENT Left 11/09/2016   Procedure: CYSTOSCOPY/URETEROSCOPY/HOLMIUM LASER/STENT PLACEMENT;  Surgeon: Nickie Retort, MD;  Location: [facility];  Service: Urology;  Laterality: Left;  90 MINS  [phone]    ESOPHAGEAL MANOMETRY N/A 12/23/2014   Procedure: ESOPHAGEAL MANOMETRY (EM);  Surgeon: Garlan Fair, MD;  Location: [facility];  Service: Endoscopy;  Laterality: N/A;   ESOPHAGOGASTRODUODENOSCOPY  last one 03-28-2015   ESOPHAGOGASTRODUODENOSCOPY (EGD) WITH PROPOFOL N/A 10/01/2014   Procedure: ESOPHAGOGASTRODUODENOSCOPY (EGD) WITH PROPOFOL;  Surgeon: Garlan Fair, MD;  Location: [facility];  Service: Endoscopy;  Laterality: N/A;   ESOPHAGOGASTRODUODENOSCOPY (EGD) WITH PROPOFOL N/A 03/05/2021   Procedure: ESOPHAGOGASTRODUODENOSCOPY (EGD) WITH PROPOFOL;  Surgeon: Ronnette Juniper, MD;  Location: [facility];  Service: Gastroenterology;  Laterality: N/A;   EXTRACORPOREAL SHOCK WAVE LITHOTRIPSY  multiple times since age 31   HOLMIUM LASER APPLICATION Left 03/02/7615   Procedure: HOLMIUM LASER APPLICATION;  Surgeon: Nickie Retort, MD;  Location: [facility];  Service: Urology;  Laterality: Left;   KNEE ARTHROSCOPY Right 03-04-2014  [facility]   w/ Arthrotomy   LAPAROSCOPIC ASSISTED VAGINAL HYSTERECTOMY  12/28/2005   LAPAROSCOPIC CHOLECYSTECTOMY  01/17/2004   LAPAROSCOPY LEFT OVARIAN CYSTECTOMY/  BILATERAL TUBAL LIGATION  02/26/2003   LEFT URETEROSCOPIC STONE  EXTRACTION /  STENT PLACEMENT  07/09/2002   NECK EXPLORATION/  BILATERAL INFERIOR PARATHYROIDECTOMY  08/03/2010   RIGHT URETERAL DILATION/  URETEROSCOPIC STONE EXTRACTION  06/12/2010   ROUX-EN-Y GASTRIC BYPASS  2001   SOLYX TRANSURETHRAL SLING/  POSTERIOR PELVIC FLOOR SACROSPINOUS REPAIR  02/21/2009   and Cysto/  Bilateral ureteral stent placement   TONSILLECTOMY AND ADENOIDECTOMY     WRIST GANGLION EXCISION Right 01/28/2000     OB History   No obstetric history on file.     Family History  Problem Relation Age of Onset   Bipolar disorder Son    Breast cancer Mother    Breast cancer Sister    Anxiety disorder Brother    Depression Brother    Breast cancer Maternal Aunt    Breast cancer Maternal Grandmother     Social History   Tobacco Use   Smoking status: Every Day    Packs/day: 0.25    Years: 20.00    Pack years: 5.00    Types: Cigarettes   Smokeless tobacco: Never  Vaping Use   Vaping Use: Never used  Substance Use Topics   Alcohol use: Yes    Comment: occ   Drug use:  No    Home Medications Prior to Admission medications   Medication Sig Start Date End Date Taking? Authorizing Provider  ALPRAZolam Duanne Moron) 0.5 MG tablet Take 0.5 mg by mouth 3 (three) times daily as needed for anxiety. 11/19/18   [provider]  Carboxymeth-Glyc-Polysorb PF (REFRESH OPTIVE MEGA-3) 0.5-1-0.5 % SOLN Place 1 drop into both eyes 3 (three) times daily as needed (dry/irritated eyes.).    [provider]  dicyclomine (BENTYL) 20 MG tablet Take 20 mg by mouth 3 (three) times daily as needed for spasms. 09/16/20   [provider]  escitalopram (LEXAPRO) 10 MG tablet Take 10 mg by mouth in the morning. 02/10/21   [provider]  esomeprazole (NEXIUM) 40 MG capsule Take 40 mg by mouth 2 (two) times daily. 02/10/21   [provider]  folic acid (FOLVITE) 1 MG tablet Take 1 mg by mouth in the morning. 02/04/21   [provider]  furosemide  (LASIX) 20 MG tablet Take 20 mg by mouth in the morning. 02/08/19   [provider]  GEODON 80 MG capsule Take 80 mg by mouth 2 (two) times daily. 02/11/21   [provider]  ibuprofen (ADVIL) 200 MG tablet Take 800 mg by mouth every 8 (eight) hours as needed (migraine headache prevention).    [provider]  lactulose (CHRONULAC) 10 GM/15ML solution Place 45 mLs (30 g total) into feeding tube every 6 (six) hours. Patient taking differently: Place 30 g into feeding tube daily as needed (constipation). 06/09/18   Shelly Coss, MD  levothyroxine (SYNTHROID) 88 MCG tablet Take 88 mcg by mouth daily before breakfast. 12/19/20   [provider]  LINZESS 290 MCG CAPS capsule Take 290 mcg by mouth daily before breakfast. 12/01/20   [provider]  lithium 300 MG tablet Take 300 mg by mouth 2 (two) times daily. 02/01/19   [provider]  ondansetron (ZOFRAN-ODT) 8 MG disintegrating tablet Take 8 mg by mouth every 6 (six) hours as needed for nausea. 01/15/21   [provider]  rifaximin (XIFAXAN) 550 MG TABS tablet Take 550 mg by mouth 2 (two) times daily.    [provider]  rizatriptan (MAXALT) 10 MG tablet Take 10 mg by mouth every 2 (two) hours as needed for migraine. 11/10/20   [provider]  sertraline (ZOLOFT) 100 MG tablet Take 200 mg by mouth daily. 02/14/19   [provider]  spironolactone (ALDACTONE) 50 MG tablet Take 50 mg by mouth in the morning. 06/23/18   [provider]  sucralfate (CARAFATE) 1 g tablet Take 1 g by mouth 2 (two) times daily. 02/10/21   [provider]  topiramate (TOPAMAX) 25 MG tablet Take 25 mg by mouth 2 (two) times daily. 02/20/21   [provider]  traZODone (DESYREL) 100 MG tablet Take 200 mg by mouth at bedtime. 02/11/21   [provider]  ursodiol (ACTIGALL) 500 MG tablet Take 500 mg by mouth 2 (two) times daily.    [provider]     Allergies    Gabapentin, Sulfasalazine, Lamictal [lamotrigine], Penicillins, Sulfa antibiotics, Tylenol [acetaminophen], and Morphine and related  Review of Systems   Review of Systems  All other systems reviewed and are negative.  Physical Exam Updated Vital Signs BP 103/63   Pulse (!) 48   Temp 98.4 F (36.9 C) (Oral)   Resp 12   Ht 1.676 m (5\' 6" )   Wt 81.2 kg   SpO2 98%  BMI 28.89 kg/m   Physical Exam Vitals and nursing note reviewed.  Constitutional:      General: She is not in acute distress.    Appearance: She is well-developed.  HENT:     Head: Normocephalic.     Comments: Hematoma posterior occiput, small laceration no active bleeding    Right Ear: External ear normal.     Left Ear: External ear normal.  Eyes:     General: No scleral icterus.       Right eye: No discharge.        Left eye: No discharge.     Conjunctiva/sclera: Conjunctivae normal.  Neck:     Trachea: No tracheal deviation.  Cardiovascular:     Rate and Rhythm: Normal rate.  Pulmonary:     Effort: Pulmonary effort is normal. No respiratory distress.     Breath sounds: No stridor.  Abdominal:     General: There is no distension.  Musculoskeletal:        General: No swelling or deformity.     Cervical back: Neck supple.  Skin:    General: Skin is warm and dry.     Findings: No rash.  Neurological:     General: No focal deficit present.     Mental Status: She is alert.     Cranial Nerves: No cranial nerve deficit (no gross deficits).     Comments: Normal strength and sensation bilateral lower extremities and bilateral upper extremities    ED Results / Procedures / Treatments   Labs (all labs ordered are listed, but only abnormal results are displayed) Labs Reviewed  CBC WITH DIFFERENTIAL/PLATELET - Abnormal; Notable for the following components:      Result Value   RBC 3.71 (*)    MCH 34.2 (*)    All other components within normal limits  COMPREHENSIVE METABOLIC PANEL -  Abnormal; Notable for the following components:   Potassium 3.1 (*)    Total Protein 5.5 (*)    All other components within normal limits    EKG EKG Interpretation  Date/Time:  Wednesday April 29 2021 02:13:49 EDT Ventricular Rate:  56 PR Interval:  180 QRS Duration: 78 QT Interval:  430 QTC Calculation: 414 R Axis:   32 Text Interpretation: Sinus bradycardia Otherwise normal ECG Confirmed by Ripley Fraise [phone]) on 04/29/2021 2:22:05 AM  Radiology CT Head Wo Contrast  Result Date: 04/29/2021 CLINICAL DATA:  50 year old female status post dizziness and fall, striking head on furniture. Posterior head laceration. EXAM: CT HEAD WITHOUT CONTRAST TECHNIQUE: Contiguous axial images were obtained from the base of the skull through the vertex without intravenous contrast. COMPARISON:  Brain MRI 06/05/2018 and earlier. Head CT 01/12/2021 and earlier. FINDINGS: Brain: Scaphocephaly redemonstrated. Stable cerebral volume. No midline shift, ventriculomegaly, mass effect, evidence of mass lesion, intracranial hemorrhage or evidence of cortically based acute infarction. Gray-white matter differentiation is within normal limits throughout the brain. No encephalomalacia identified. Vascular: No suspicious intracranial vascular hyperdensity. Skull: Stable and intact. Sinuses/Orbits: Tympanic cavities are clear. Visualized paranasal sinuses and mastoids are clear. Other: Left of midline occiput put scalp hematoma and laceration on series 3, image 26 with small volume soft tissue gas. Underlying occipital protuberance intact. No other orbit or scalp soft tissue injury identified. IMPRESSION: 1. Occipital scalp hematoma and laceration without underlying skull fracture. 2. Stable noncontrast CT appearance of the brain, negative aside from scaphocephaly. Electronically Signed   By: Genevie Ann M.D.   On: 04/29/2021 06:34    Procedures .Marland Kitchen  Laceration Repair  Date/Time: 04/29/2021 7:42 AM Performed by: Dorie Rank,  MD Authorized by: Dorie Rank, MD   Consent:    Consent obtained:  Verbal   Consent given by:  Patient   Risks discussed:  Infection, need for additional repair, pain, poor cosmetic result and poor wound healing   Alternatives discussed:  No treatment and delayed treatment Universal protocol:    Procedure explained and questions answered to patient or proxy's satisfaction: yes     Relevant documents present and verified: yes     Test results available: yes     Imaging studies available: yes     Required blood products, implants, devices, and special equipment available: yes     Site/side marked: yes     Immediately prior to procedure, a time out was called: yes     Patient identity confirmed:  Verbally with patient Anesthesia:    Anesthesia method:  None Laceration details:    Location:  Scalp   Length (cm):  0.8 Treatment:    Area cleansed with:  Povidone-iodine   Amount of cleaning:  Standard Skin repair:    Repair method:  Staples   Number of staples:  1 Approximation:    Approximation:  Close Repair type:    Repair type:  Simple Post-procedure details:    Dressing:  Open (no dressing)   Procedure completion:  Tolerated well, no immediate complications   Medications Ordered in ED Medications  fentaNYL (SUBLIMAZE) injection 50 mcg (50 mcg Intravenous Given 04/29/21 0640)    ED Course  I have reviewed the triage vital signs and the nursing notes.  Pertinent labs & imaging results that were available during my care of the patient were reviewed by me and considered in my medical decision making (see chart for details).  Clinical Course as of 04/29/21 0745  Wed Apr 29, 2021  0740 CBC is normal. [JK]  7169 Metabolic panel shows mild hypokalemia [JK]    Clinical Course User Index [JK] Dorie Rank, MD   MDM Rules/Calculators/A&P                          Patient presented to the ED for evaluation after a fall.  Patient states she has history of gait unsteadiness.   Vital signs are reassuring although she is bradycardic.  Sinus bradycardia noted on the monitor.  Patient is asymptomatic and I doubt this was related to her fall.  CT scan does not show any signs of serious injury.  Laboratory test do not show signs of anemia or dehydration.  Patient does appear to have mild concussion based on her symptoms.  Scalp laceration was treated with 1 staple without difficulty.  Patient is stable for discharge Final Clinical Impression(s) / ED Diagnoses Final diagnoses:  Laceration of scalp, initial encounter  Concussion without loss of consciousness, initial encounter  Fall, initial encounter    Rx / DC Orders ED Discharge Orders     None        Dorie Rank, MD 04/29/21 0745

## 2021-04-29 NOTE — Discharge Instructions (Addendum)
Follow-up with your doctor to have the staples removed in approximately 7 days.  You can rinse your hair but avoid scrubbing around the staple.   Apply ice to help with the swelling and pain

## 2021-05-07 ENCOUNTER — Inpatient Hospital Stay: Admission: RE | Admit: 2021-05-07 | Payer: BC Managed Care – PPO | Source: Ambulatory Visit

## 2021-05-08 DIAGNOSIS — I959 Hypotension, unspecified: Secondary | ICD-10-CM | POA: Diagnosis not present

## 2021-05-08 DIAGNOSIS — R5383 Other fatigue: Secondary | ICD-10-CM | POA: Diagnosis not present

## 2021-05-11 DIAGNOSIS — R197 Diarrhea, unspecified: Secondary | ICD-10-CM | POA: Diagnosis not present

## 2021-05-25 ENCOUNTER — Ambulatory Visit
Admission: RE | Admit: 2021-05-25 | Discharge: 2021-05-25 | Disposition: A | Discharge status: Home / Self Care | Payer: BC Managed Care – PPO | Source: Ambulatory Visit | Attending: General Surgery | Admitting: General Surgery

## 2021-05-25 DIAGNOSIS — I878 Other specified disorders of veins: Secondary | ICD-10-CM | POA: Diagnosis not present

## 2021-05-25 DIAGNOSIS — K449 Diaphragmatic hernia without obstruction or gangrene: Secondary | ICD-10-CM | POA: Diagnosis not present

## 2021-05-25 DIAGNOSIS — Z01818 Encounter for other preprocedural examination: Secondary | ICD-10-CM | POA: Diagnosis not present

## 2021-05-25 DIAGNOSIS — M419 Scoliosis, unspecified: Secondary | ICD-10-CM | POA: Diagnosis not present

## 2021-05-25 MED ORDER — IOPAMIDOL (ISOVUE-300) INJECTION 61%
100.0000 mL | Freq: Once | INTRAVENOUS | Status: AC | PRN
  Administered 2021-05-25: 100 mL via INTRAVENOUS

## 2021-06-04 ENCOUNTER — Telehealth: Payer: Self-pay

## 2021-06-04 NOTE — Telephone Encounter (Signed)
Referral notes received from [facility], Phone [phone]: , Fax #: [phone]   Notes sent to scheduling

## 2021-06-09 DIAGNOSIS — F3181 Bipolar II disorder: Secondary | ICD-10-CM | POA: Diagnosis not present

## 2021-06-25 DIAGNOSIS — F411 Generalized anxiety disorder: Secondary | ICD-10-CM | POA: Insufficient documentation

## 2021-06-25 DIAGNOSIS — Z973 Presence of spectacles and contact lenses: Secondary | ICD-10-CM | POA: Insufficient documentation

## 2021-06-25 DIAGNOSIS — Z9889 Other specified postprocedural states: Secondary | ICD-10-CM | POA: Insufficient documentation

## 2021-06-25 DIAGNOSIS — N201 Calculus of ureter: Secondary | ICD-10-CM | POA: Insufficient documentation

## 2021-06-25 DIAGNOSIS — K449 Diaphragmatic hernia without obstruction or gangrene: Secondary | ICD-10-CM | POA: Insufficient documentation

## 2021-06-25 DIAGNOSIS — E079 Disorder of thyroid, unspecified: Secondary | ICD-10-CM | POA: Insufficient documentation

## 2021-06-25 DIAGNOSIS — R112 Nausea with vomiting, unspecified: Secondary | ICD-10-CM | POA: Insufficient documentation

## 2021-06-25 DIAGNOSIS — R3915 Urgency of urination: Secondary | ICD-10-CM | POA: Insufficient documentation

## 2021-06-25 DIAGNOSIS — Z7289 Other problems related to lifestyle: Secondary | ICD-10-CM | POA: Insufficient documentation

## 2021-06-25 DIAGNOSIS — K3184 Gastroparesis: Secondary | ICD-10-CM | POA: Insufficient documentation

## 2021-06-25 DIAGNOSIS — Z87442 Personal history of urinary calculi: Secondary | ICD-10-CM | POA: Insufficient documentation

## 2021-06-26 ENCOUNTER — Telehealth: Payer: Self-pay | Admitting: Orthopaedic Surgery

## 2021-06-26 NOTE — Telephone Encounter (Signed)
I called and instructed pt to contact her surgeon to see if it was ok with them. She stated she was just going to come in anyways and have it done. She really needs the inj. Do you think this will be ok?

## 2021-06-26 NOTE — Telephone Encounter (Signed)
Patient called needing get injections in her hip 07/14/2021.  Patient asked if the injections will interfere with her surgery on 07/21/2021? The number to contact patient is   [phone]

## 2021-06-26 NOTE — Telephone Encounter (Signed)
[place] noted. Pt has appt on 9/13

## 2021-06-29 ENCOUNTER — Ambulatory Visit (INDEPENDENT_AMBULATORY_CARE_PROVIDER_SITE_OTHER): Payer: BC Managed Care – PPO | Admitting: Cardiology

## 2021-06-29 ENCOUNTER — Other Ambulatory Visit: Payer: Self-pay

## 2021-06-29 ENCOUNTER — Ambulatory Visit (INDEPENDENT_AMBULATORY_CARE_PROVIDER_SITE_OTHER): Payer: BC Managed Care – PPO

## 2021-06-29 ENCOUNTER — Encounter: Payer: Self-pay | Admitting: Cardiology

## 2021-06-29 VITALS — BP 88/48 | HR 56 | Ht 66.0 in | Wt 172.4 lb

## 2021-06-29 DIAGNOSIS — I950 Idiopathic hypotension: Secondary | ICD-10-CM

## 2021-06-29 DIAGNOSIS — I495 Sick sinus syndrome: Secondary | ICD-10-CM | POA: Diagnosis not present

## 2021-06-29 DIAGNOSIS — R001 Bradycardia, unspecified: Secondary | ICD-10-CM

## 2021-06-29 DIAGNOSIS — F17210 Nicotine dependence, cigarettes, uncomplicated: Secondary | ICD-10-CM

## 2021-06-29 DIAGNOSIS — Z01810 Encounter for preprocedural cardiovascular examination: Secondary | ICD-10-CM | POA: Diagnosis not present

## 2021-06-29 DIAGNOSIS — Z9884 Bariatric surgery status: Secondary | ICD-10-CM

## 2021-06-29 DIAGNOSIS — I959 Hypotension, unspecified: Secondary | ICD-10-CM | POA: Insufficient documentation

## 2021-06-29 HISTORY — DX: Bradycardia, unspecified: R00.1

## 2021-06-29 NOTE — Progress Notes (Signed)
Cardiology Consultation:    Date:  06/29/2021   ID:  ARAN RENIER, DOB [birth year], MRN [id number]  PCP:  Josetta Huddle, MD  Cardiologist:  Jenne Campus, MD   Referring MD: Greer Pickerel, MD   Chief Complaint  Patient presents with   Clearance 07/21/2021   Dizziness   Low BP    Ongoing for years     History of Present Illness:    Jordan Russell is a 50 y.o. female who is being seen today for the evaluation of low blood pressure and heart rate at the request of Greer Pickerel, MD. with past medical history which is very complex years ago she did have gastric bypass surgery.  She lost about 100 pounds.  However within the last few years her health significantly deteriorated.  It started with her having kidney failure.  Apparently she was on palliative care for a while but lately seems to recover quite nicely.  She also have significant GI problem.  She does have chronic diarrhea.  Also apparently esophageal spasm, she is scheduled to have surgery to repair her problem with her esophagus as well as hiatal hernia.  She was sent to [place] for evaluation.  We will brought her to our office is the fact that she always gets sinus bradycardia as well as low blood pressure.  She described to have chronic diarrhea anytime she eats and she cannot eat solid food she can eat only liquid food anytime she eats that very quickly she go to the restroom to have a bowel movement which is always loose.  She tells me that she is chronically dehydrated.  She also noted to have slow heart rate however her EKG today show heart rate in the middle of 50.  She did have an episode of passing out which happened when he she standing up she was still getting woozy dizzy and eventually end up going down.  She never had any heart trouble however years ago she was told that she got mitral valve prolapse and then later somebody told her that this is not true she does not have this problem.  She tried to be active but  overall weak tired and exhausted.  She tells me when she goes to store she cannot do shopping she always order online and he comes to pick it up. Sadly she still continues to smoke and she is not smoking for many years.  She quit smoking few times but every single time relapse and still smoking right now. She does have family history of heart problem but not premature.   Past Medical History:  Diagnosis Date   Asthma    Bipolar 1 disorder ([place])    Fibromyalgia    GAD (generalized anxiety disorder)    Gastroparesis    GERD (gastroesophageal reflux disease)    Hiatal hernia    History of kidney stones    History of primary hyperparathyroidism    s/p  bilateral inferior parathyroidectomy 08-03-2010   IBS (irritable bowel syndrome)    Left ureteral stone    Migraine    PONV (postoperative nausea and vomiting)    and claustrophobic with mask   RA (rheumatoid arthritis) ([place])    dr Gavin Pound-- rheumtologist   Self mutilating behavior    Thyroid disease    Urgency of urination    Wears glasses     Past Surgical History:  Procedure Laterality Date   ABDOMINAL HYSTERECTOMY     BALLOON DILATION N/A 10/01/2014  Procedure: BALLOON DILATION;  Surgeon: Garlan Fair, MD;  Location: [facility];  Service: Endoscopy;  Laterality: N/A;   BIOPSY  03/05/2021   Procedure: BIOPSY;  Surgeon: Ronnette Juniper, MD;  Location: [facility];  Service: Gastroenterology;;   CYSTO/  BILATERAL RETROGRADE PYELOGRAM  11/20/2000   CYSTO/  RIGHT RETROGRADE PYELOGRAM/  RIGHT URETEROSCOPY/  STENT PLACEMENT  12/16/2006   CYSTOSCOPY/URETEROSCOPY/HOLMIUM LASER/STENT PLACEMENT Left 11/09/2016   Procedure: CYSTOSCOPY/URETEROSCOPY/HOLMIUM LASER/STENT PLACEMENT;  Surgeon: Nickie Retort, MD;  Location: [facility];  Service: Urology;  Laterality: Left;  90 MINS  [phone]    ESOPHAGEAL MANOMETRY N/A 12/23/2014   Procedure: ESOPHAGEAL MANOMETRY (EM);  Surgeon: Garlan Fair, MD;  Location: [facility];  Service: Endoscopy;  Laterality: N/A;   ESOPHAGOGASTRODUODENOSCOPY  last one 03-28-2015   ESOPHAGOGASTRODUODENOSCOPY (EGD) WITH PROPOFOL N/A 10/01/2014   Procedure: ESOPHAGOGASTRODUODENOSCOPY (EGD) WITH PROPOFOL;  Surgeon: Garlan Fair, MD;  Location: [facility];  Service: Endoscopy;  Laterality: N/A;   ESOPHAGOGASTRODUODENOSCOPY (EGD) WITH PROPOFOL N/A 03/05/2021   Procedure: ESOPHAGOGASTRODUODENOSCOPY (EGD) WITH PROPOFOL;  Surgeon: Ronnette Juniper, MD;  Location: [facility];  Service: Gastroenterology;  Laterality: N/A;   EXTRACORPOREAL SHOCK WAVE LITHOTRIPSY  multiple times since age 24   HOLMIUM LASER APPLICATION Left [id number]   Procedure: HOLMIUM LASER APPLICATION;  Surgeon: Nickie Retort, MD;  Location: [facility];  Service: Urology;  Laterality: Left;   KNEE ARTHROSCOPY Right 03-04-2014  [facility]   w/ Arthrotomy   LAPAROSCOPIC ASSISTED VAGINAL HYSTERECTOMY  12/28/2005   LAPAROSCOPIC CHOLECYSTECTOMY  01/17/2004   LAPAROSCOPY LEFT OVARIAN CYSTECTOMY/  BILATERAL TUBAL LIGATION  02/26/2003   LEFT URETEROSCOPIC STONE EXTRACTION /  STENT PLACEMENT  07/09/2002   NECK EXPLORATION/  BILATERAL INFERIOR PARATHYROIDECTOMY  08/03/2010   RIGHT URETERAL DILATION/  URETEROSCOPIC STONE EXTRACTION  06/12/2010   ROUX-EN-Y GASTRIC BYPASS  2001   SOLYX TRANSURETHRAL SLING/  POSTERIOR PELVIC FLOOR SACROSPINOUS REPAIR  02/21/2009   and Cysto/  Bilateral ureteral stent placement   TONSILLECTOMY AND ADENOIDECTOMY     WRIST GANGLION EXCISION Right 01/28/2000    Current Medications: Current Meds  Medication Sig   ALPRAZolam (XANAX) 0.5 MG tablet Take 0.5 mg by mouth 3 (three) times daily as needed for anxiety.   Carboxymeth-Glyc-Polysorb PF (REFRESH OPTIVE MEGA-3) 0.5-1-0.5 % SOLN Place 1 drop into both eyes 3 (three) times daily as needed (dry/irritated eyes.).   dicyclomine (BENTYL) 20 MG tablet Take 20 mg by mouth 3 (three) times daily as needed for spasms.   escitalopram  (LEXAPRO) 20 MG tablet Take 20 mg by mouth in the morning.   esomeprazole (NEXIUM) 40 MG capsule Take 40 mg by mouth 2 (two) times daily.   folic acid (FOLVITE) 1 MG tablet Take 1 mg by mouth in the morning.   furosemide (LASIX) 20 MG tablet Take 20 mg by mouth in the morning.   GEODON 80 MG capsule Take 80 mg by mouth 2 (two) times daily.   ibuprofen (ADVIL) 200 MG tablet Take 800 mg by mouth every 8 (eight) hours as needed for moderate pain or headache (migraine headache prevention).   lactulose (CHRONULAC) 10 GM/15ML solution Place 45 mLs (30 g total) into feeding tube every 6 (six) hours. (Patient taking differently: Place 30 g into feeding tube daily as needed for mild constipation, moderate constipation or severe constipation (constipation).)   levothyroxine (SYNTHROID) 88 MCG tablet Take 88 mcg by mouth daily before breakfast.   LINZESS 290 MCG CAPS capsule Take 290 mcg by  mouth daily before breakfast.   lithium 300 MG tablet Take 300 mg by mouth 2 (two) times daily.   ondansetron (ZOFRAN-ODT) 8 MG disintegrating tablet Take 8 mg by mouth every 6 (six) hours as needed for nausea.   rifaximin (XIFAXAN) 550 MG TABS tablet Take 550 mg by mouth 2 (two) times daily.   rizatriptan (MAXALT) 10 MG tablet Take 10 mg by mouth every 2 (two) hours as needed for migraine.   sertraline (ZOLOFT) 100 MG tablet Take 200 mg by mouth daily.   spironolactone (ALDACTONE) 50 MG tablet Take 50 mg by mouth in the morning.   topiramate (TOPAMAX) 25 MG tablet Take 25 mg by mouth 2 (two) times daily.   traZODone (DESYREL) 100 MG tablet Take 200 mg by mouth at bedtime.     Allergies:   Gabapentin, Sulfasalazine, Lamictal [lamotrigine], Penicillins, Sulfa antibiotics, Tylenol [acetaminophen], and Morphine and related   Social History   Socioeconomic History   Marital status: Married    Spouse name: Not on file   Number of children: 1   Years of education: Not on file   Highest education level: Master's degree  (e.g., MA, MS, MEng, MEd, MSW, MBA)  Occupational History   Occupation: disabled   Tobacco Use   Smoking status: Every Day    Packs/day: 0.25    Years: 20.00    Pack years: 5.00    Types: Cigarettes   Smokeless tobacco: Never  Vaping Use   Vaping Use: Never used  Substance and Sexual Activity   Alcohol use: Yes    Comment: occ   Drug use: No   Sexual activity: Not on file  Other Topics Concern   Not on file  Social History Narrative   Right handed   Lives with husband, son and son's best friend in a one story home.  Education: Oceanographer.    Social Determinants of Health   Financial Resource Strain: Not on file  Food Insecurity: Not on file  Transportation Needs: Not on file  Physical Activity: Not on file  Stress: Not on file  Social Connections: Not on file     Family History: The patient's family history includes Anxiety disorder in her brother; Bipolar disorder in her son; Breast cancer in her maternal aunt, maternal grandmother, mother, and sister; Depression in her brother. ROS:   Please see the history of present illness.    All 14 point review of systems negative except as described per history of present illness.  EKGs/Labs/Other Studies Reviewed:    The following studies were reviewed today:   EKG:  EKG is  ordered today.  The ekg ordered today demonstrates sinus bradycardia rate 55 otherwise normal EKG  Recent Labs: 04/29/2021: ALT 16; BUN 9; Creatinine, Ser 0.70; Hemoglobin 12.7; Platelets 243; Potassium 3.1; Sodium 140  Recent Lipid Panel No results found for: CHOL, TRIG, HDL, CHOLHDL, VLDL, LDLCALC, LDLDIRECT  Physical Exam:    VS:  BP (!) 88/48 (BP Location: Left Arm, Patient Position: Sitting)   Pulse (!) 56   Ht '5\' 6"'$  (1.676 m)   Wt 172 lb 6.4 oz (78.2 kg)   SpO2 96%   BMI 27.83 kg/m     Wt Readings from Last 3 Encounters:  06/29/21 172 lb 6.4 oz (78.2 kg)  04/29/21 179 lb (81.2 kg)  03/05/21 180 lb (81.6 kg)     GEN:  Well nourished,  well developed in no acute distress HEENT: Normal NECK: No JVD; No carotid bruits LYMPHATICS: No lymphadenopathy  CARDIAC: RRR, no murmurs, no rubs, no gallops RESPIRATORY:  Clear to auscultation without rales, wheezing or rhonchi  ABDOMEN: Soft, non-tender, non-distended MUSCULOSKELETAL:  No edema; No deformity  SKIN: Warm and dry NEUROLOGIC:  Alert and oriented x 3 PSYCHIATRIC:  Normal affect   ASSESSMENT:    1. Preop cardiovascular exam   2. Tachycardia-bradycardia syndrome ([place])   3. Bradycardia   4. Sinus bradycardia   5. Idiopathic hypotension   6. Status post bariatric surgery   7. Morbid obesity ([place])   8. Cigarette nicotine dependence without complication    PLAN:    In order of problems listed above:  Cardiovascular preop evaluation for this LAD with multiple issues.  Cardiac issues involve bradycardia.  She does have some episode of syncope which I think is a combination of hypotension as well as bradycardia.  I will ask her to wear Zio patch to make sure she does not have any critical bradycardia that required some interventions.  So far I did not see recommendation for pacemaker indication for pacemaker for sinus bradycardia is only symptomatic bradycardia. Idiopathic hypotension.  Her blood pressure is always low.  We will ask her to have an echocardiogram to assess left ventricular ejection fraction make sure her heart is structurally normal, luckily on my physical examination I do not hear any murmur. As a part of evaluation before surgery because of multiple risk factors having heart problem I think it would be reasonable to perform evaluation for coronary artery disease.  I will schedule her to have [place]. Smoking obviously huge problem we spent at least 10 minutes talking about it she told me straight that she is not interested in quitting. Dyslipidemia I do have her fasting lipid profile from December 20, 2018 with LDL of 129 HDL 59, however, I will not do  anything about this until I will have better understanding where she standing with her problems.  Also because of significant liver problem I be very reluctant to put her on cholesterol medication.   Medication Adjustments/Labs and Tests Ordered: Current medicines are reviewed at length with the patient today.  Concerns regarding medicines are outlined above.  Orders Placed This Encounter  Procedures   MYOCARDIAL PERFUSION IMAGING   LONG TERM MONITOR (3-14 DAYS)   EKG 12-Lead   ECHOCARDIOGRAM COMPLETE   No orders of the defined types were placed in this encounter.   Signed, Park Liter, MD, [facility]. 06/29/2021 4:59 PM    [place]

## 2021-06-29 NOTE — Progress Notes (Signed)
Sent message, via epic in basket, requesting orders in epic from surgeon.  

## 2021-06-29 NOTE — Patient Instructions (Signed)
Medication Instructions:  Your physician recommends that you continue on your current medications as directed. Please refer to the Current Medication list given to you today.  *If you need a refill on your cardiac medications before your next appointment, please call your pharmacy*   Lab Work: None If you have labs (blood work) drawn today and your tests are completely normal, you will receive your results only by: [place] (if you have MyChart) OR A paper copy in the mail If you have any lab test that is abnormal or we need to change your treatment, we will call you to review the results.   Testing/Procedures: Your physician has requested that you have an echocardiogram. Echocardiography is a painless test that uses sound waves to create images of your heart. It provides your doctor with information about the size and shape of your heart and how well your heart's chambers and valves are working. This procedure takes approximately one hour. There are no restrictions for this procedure.  A zio monitor was ordered today. It will remain on for 14 days. You will then return monitor and event diary in provided box. It takes 1-2 weeks for report to be downloaded and returned to [place]. We will call you with the results. If monitor falls off or has orange flashing light, please call Zio for further instructions.     [facility] [address] [place], [place] [address] Phone:  [phone]    Please arrive 15 minutes prior to your appointment time for registration and insurance purposes.  The test will take approximately 3 to 4 hours to complete; you may bring reading material.  If someone comes with you to your appointment, they will need to remain in the main lobby due to limited space in the testing area. **If you are pregnant or breastfeeding, please notify the nuclear lab prior to your appointment**  How to prepare for your Myocardial Perfusion Test: Do not  eat or drink 3 hours prior to your test, except you may have water. Do not consume products containing caffeine (regular or decaffeinated) 12 hours prior to your test. (ex: coffee, chocolate, sodas, tea). Do bring a list of your current medications with you.  If not listed below, you may take your medications as normal. Do wear comfortable clothes (no dresses or overalls) and walking shoes, tennis shoes preferred (No heels or open toe shoes are allowed). Do NOT wear cologne, perfume, aftershave, or lotions (deodorant is allowed). If these instructions are not followed, your test will have to be rescheduled.  Please report to [address] for your test.  If you have questions or concerns about your appointment, you can call the [place] [facility] at [phone].  If you cannot keep your appointment, please provide 24 hours notification to the Nuclear Lab, to avoid a possible $50 charge to your account.    Follow-Up: At [facility], you and your health needs are our priority.  As part of our continuing mission to provide you with exceptional heart care, we have created designated Provider Care Teams.  These Care Teams include your primary Cardiologist (physician) and Advanced Practice Providers (APPs -  Physician Assistants and Nurse Practitioners) who all work together to provide you with the care you need, when you need it.  We recommend signing up for the patient portal called "MyChart".  Sign up information is provided on this After Visit Summary.  MyChart is used to connect with patients  for Virtual Visits (Telemedicine).  Patients are able to view lab/test results, encounter notes, upcoming appointments, etc.  Non-urgent messages can be sent to your provider as well.   To learn more about what you can do with MyChart, go to [email or web address].    Your next appointment:   3 month(s)  The format for your next appointment:   In Person  Provider:    Jenne Campus, MD   Other Instructions

## 2021-06-30 ENCOUNTER — Telehealth (HOSPITAL_COMMUNITY): Payer: Self-pay | Admitting: *Deleted

## 2021-06-30 NOTE — Telephone Encounter (Signed)
Patient given detailed instructions per Myocardial Perfusion Study Information Sheet for the test on 07/08/21 at 11:00. Patient notified to arrive 15 minutes early and that it is imperative to arrive on time for appointment to keep from having the test rescheduled.  If you need to cancel or reschedule your appointment, please call the office within 24 hours of your appointment. . Patient verbalized understanding.Jordan Russell

## 2021-07-01 ENCOUNTER — Encounter (HOSPITAL_COMMUNITY): Payer: Self-pay

## 2021-07-01 ENCOUNTER — Other Ambulatory Visit: Payer: Self-pay

## 2021-07-01 ENCOUNTER — Encounter (HOSPITAL_COMMUNITY)
Admission: RE | Admit: 2021-07-01 | Discharge: 2021-07-01 | Disposition: A | Discharge status: Home / Self Care | Payer: BC Managed Care – PPO | Source: Ambulatory Visit | Attending: General Surgery | Admitting: General Surgery

## 2021-07-01 DIAGNOSIS — Z01812 Encounter for preprocedural laboratory examination: Secondary | ICD-10-CM | POA: Diagnosis not present

## 2021-07-01 HISTORY — DX: Hypotension, unspecified: I95.9

## 2021-07-01 HISTORY — DX: Chronic kidney disease, unspecified: N18.9

## 2021-07-01 HISTORY — DX: Hypothyroidism, unspecified: E03.9

## 2021-07-01 HISTORY — DX: Depression, unspecified: F32.A

## 2021-07-01 LAB — BASIC METABOLIC PANEL
Anion gap: 5 (ref 5–15)
BUN: 9 mg/dL (ref 6–20)
CO2: 26 mmol/L (ref 22–32)
Calcium: 9.4 mg/dL (ref 8.9–10.3)
Chloride: 109 mmol/L (ref 98–111)
Creatinine, Ser: 0.88 mg/dL (ref 0.44–1.00)
GFR, Estimated: >60 mL/min (ref >60)
Glucose, Bld: 96 mg/dL (ref 70–99)
Potassium: 3.6 mmol/L (ref 3.5–5.1)
Sodium: 140 mmol/L (ref 135–145)

## 2021-07-01 LAB — CBC
HCT: 44.8 % (ref 36.0–46.0)
Hemoglobin: 14.8 g/dL (ref 12.0–15.0)
MCH: 32.5 pg (ref 26.0–34.0)
MCHC: 33 g/dL (ref 30.0–36.0)
MCV: 98.5 fL (ref 80.0–100.0)
Platelets: 225 10*3/uL (ref 150–400)
RBC: 4.55 MIL/uL (ref 3.87–5.11)
RDW: 13.4 % (ref 11.5–15.5)
WBC: 6 10*3/uL (ref 4.0–10.5)
nRBC: 0 % (ref 0.0–0.2)

## 2021-07-01 NOTE — Progress Notes (Signed)
COVID Vaccine Completed: Yes Date COVID Vaccine completed: 01/20/21. X 4 COVID vaccine manufacturer: Pfizer     Covid test: 07/17/21  PCP - Dr. Josetta Huddle Cardiologist - Dr. Jenne Campus. LOV: 06/29/21  Chest x-ray - 01/15/21. CT: 05/26/21 EKG - 06/30/21. EPIC Stress Test -  ECHO - 06/29/21 Cardiac Cath -  Pacemaker/ICD device last checked:  Sleep Study -  CPAP -   Fasting Blood Sugar -  Checks Blood Sugar _____ times a day  Blood Thinner Instructions: Aspirin Instructions: Last Dose:  Anesthesia review: Bradycardia,hypotension,smoker.  Patient denies shortness of breath, fever, cough and chest pain at PAT appointment   Patient verbalized understanding of instructions that were given to them at the PAT appointment. Patient was also instructed that they will need to review over the PAT instructions again at home before surgery.

## 2021-07-01 NOTE — Patient Instructions (Signed)
DUE TO COVID-19 ONLY ONE VISITOR IS ALLOWED TO COME WITH YOU AND STAY IN THE WAITING ROOM ONLY DURING PRE OP AND PROCEDURE.   **NO VISITORS ARE ALLOWED IN THE SHORT STAY AREA OR RECOVERY ROOM!!**  IF YOU WILL BE ADMITTED INTO THE HOSPITAL YOU ARE ALLOWED ONLY TWO SUPPORT PEOPLE DURING VISITATION HOURS ONLY (10AM -8PM)   The support person(s) may change daily. The support person(s) must pass our screening, gel in and out, and wear a mask at all times, including in the patient's room. Patients must also wear a mask when staff or their support person are in the room.  No visitors under the age of 76. Any visitor under the age of 15 must be accompanied by an adult.    COVID SWAB TESTING MUST BE COMPLETED ON:  07/17/21 **MUST PRESENT COMPLETED FORM AT TESTING SITE**    [place] (backside of the building) You are not required to quarantine, however you are required to wear a well-fitted mask when you are out and around people not in your household.  Hand Hygiene often Do NOT share personal items Notify your provider if you are in close contact with someone who has COVID or you develop fever 100.4 or greater, new onset of sneezing, cough, sore throat, shortness of breath or body aches.  [address] [place], [address], must go inside of the hospital, NOT A DRIVE THRU!  (Must self quarantine after testing. Follow instructions on handout.)       Your procedure is scheduled on: 07/21/21   Report to [facility] Main  Entrance    Report to admitting at : 12:00 PM   Call this number if you have problems the morning of surgery [phone]   Do not eat food :After Midnight.   May have liquids until: 11:00 AM.    day of surgery  CLEAR LIQUID DIET  Foods Allowed                                                                     Foods Excluded  Water, Black Coffee and tea, regular and decaf                              liquids that you cannot  Plain Jell-O in any flavor  (No red)                                           see through such as: Fruit ices (not with fruit pulp)                                     milk, soups, orange juice              Iced Popsicles (No red)  All solid food                                   Apple juices Sports drinks like Gatorade (No red) Lightly seasoned clear broth or consume(fat free) Sugar,   Sample Menu Breakfast                                Lunch                                     Supper Cranberry juice                    Beef broth                            Chicken broth Jell-O                                     Grape juice                           Apple juice Coffee or tea                        Jell-O                                      Popsicle                                                Coffee or tea                        Coffee or tea     Oral Hygiene is also important to reduce your risk of infection.                                    Remember - BRUSH YOUR TEETH THE MORNING OF SURGERY WITH YOUR REGULAR TOOTHPASTE   Do NOT smoke after Midnight   Take these medicines the morning of surgery with A SIP OF WATER: Topamax,lexapro,sertraline,esomeprazole,synthroid,rifaximin,lithium,Geodon.Xanax and rizatriptan as needed.Eye drops as usual.  DO NOT TAKE ANY ORAL DIABETIC MEDICATIONS DAY OF YOUR SURGERY                              You may not have any metal on your body including hair pins, jewelry, and body piercing             Do not wear make-up, lotions, powders, perfumes/cologne, or deodorant  Do not wear nail polish including gel and S&S, artificial/acrylic nails, or any other type of covering on natural nails including finger and toenails. If you have artificial nails, gel coating, etc. that needs to be removed by a nail salon please  have this removed prior to surgery or surgery may need to be canceled/  delayed if the surgeon/ anesthesia feels like they are unable to be safely monitored.   Do not shave  48 hours prior to surgery.     Do not bring valuables to the hospital. [place].   Contacts, dentures or bridgework may not be worn into surgery.   Bring small overnight bag day of surgery.    Patients discharged the day of surgery will not be allowed to drive home.   Special Instructions: Bring a copy of your healthcare power of attorney and living will documents         the day of surgery if you haven't scanned them in before.              Please read over the following fact sheets you were given: IF YOU HAVE QUESTIONS ABOUT YOUR PRE OP INSTRUCTIONS PLEASE CALL [phone]   [place] - Preparing for Surgery Before surgery, you can play an important role.  Because skin is not sterile, your skin needs to be as free of germs as possible.  You can reduce the number of germs on your skin by washing with CHG (chlorahexidine gluconate) soap before surgery.  CHG is an antiseptic cleaner which kills germs and bonds with the skin to continue killing germs even after washing. Please DO NOT use if you have an allergy to CHG or antibacterial soaps.  If your skin becomes reddened/irritated stop using the CHG and inform your nurse when you arrive at Short Stay. Do not shave (including legs and underarms) for at least 48 hours prior to the first CHG shower.  You may shave your face/neck. Please follow these instructions carefully:  1.  Shower with CHG Soap the night before surgery and the  morning of Surgery.  2.  If you choose to wash your hair, wash your hair first as usual with your  normal  shampoo.  3.  After you shampoo, rinse your hair and body thoroughly to remove the  shampoo.                           4.  Use CHG as you would any other liquid soap.  You can apply chg directly  to the skin and wash                       Gently with a scrungie  or clean washcloth.  5.  Apply the CHG Soap to your body ONLY FROM THE NECK DOWN.   Do not use on face/ open                           Wound or open sores. Avoid contact with eyes, ears mouth and genitals (private parts).                       Wash face,  Genitals (private parts) with your normal soap.             6.  Wash thoroughly, paying special attention to the area where your surgery  will be performed.  7.  Thoroughly rinse your body with warm water from the neck down.  8.  DO NOT shower/wash with your normal soap after  using and rinsing off  the CHG Soap.                9.  Pat yourself dry with a clean towel.            10.  Wear clean pajamas.            11.  Place clean sheets on your bed the night of your first shower and do not  sleep with pets. Day of Surgery : Do not apply any lotions/deodorants the morning of surgery.  Please wear clean clothes to the hospital/surgery center.  FAILURE TO FOLLOW THESE INSTRUCTIONS MAY RESULT IN THE CANCELLATION OF YOUR SURGERY PATIENT SIGNATURE_________________________________  NURSE SIGNATURE__________________________________  ________________________________________________________________________

## 2021-07-02 ENCOUNTER — Encounter: Payer: Self-pay | Admitting: Cardiology

## 2021-07-02 ENCOUNTER — Telehealth: Payer: Self-pay | Admitting: Cardiology

## 2021-07-02 NOTE — Telephone Encounter (Signed)
Pt is scheduled for Nuclear Medicine 09/07  kbl

## 2021-07-02 NOTE — Telephone Encounter (Signed)
Nuclear Medicine Studies Of Hearts at risk has been scheduled with [facility] and it has been approved valid 30 days from todays date 07/02/21 [id number]

## 2021-07-02 NOTE — Telephone Encounter (Signed)
error 

## 2021-07-07 NOTE — Addendum Note (Signed)
Addended by: Truddie Hidden on: 07/07/2021 12:26 PM   Modules accepted: Orders

## 2021-07-07 NOTE — Addendum Note (Signed)
Addended by: Jenne Campus on: 07/07/2021 01:00 PM   Modules accepted: Orders

## 2021-07-08 ENCOUNTER — Ambulatory Visit (INDEPENDENT_AMBULATORY_CARE_PROVIDER_SITE_OTHER): Payer: BC Managed Care – PPO

## 2021-07-08 ENCOUNTER — Other Ambulatory Visit: Payer: Self-pay

## 2021-07-08 DIAGNOSIS — Z01810 Encounter for preprocedural cardiovascular examination: Secondary | ICD-10-CM

## 2021-07-08 LAB — MYOCARDIAL PERFUSION IMAGING
LV dias vol: 95 mL (ref 46–106)
LV sys vol: 46 mL
Nuc Stress EF: 51 %
Peak HR: 68 {beats}/min
Rest HR: 43 {beats}/min
Rest Nuclear Isotope Dose: 10.9 mCi
SDS: 0
SRS: 2
SSS: 2
Stress Nuclear Isotope Dose: 30.1 mCi
TID: 1.26

## 2021-07-08 MED ORDER — TECHNETIUM TC 99M TETROFOSMIN IV KIT
10.9000 | PACK | Freq: Once | INTRAVENOUS | Status: AC | PRN
  Administered 2021-07-08: 10.9 via INTRAVENOUS

## 2021-07-08 MED ORDER — TECHNETIUM TC 99M TETROFOSMIN IV KIT
30.1000 | PACK | Freq: Once | INTRAVENOUS | Status: AC | PRN
  Administered 2021-07-08: 30.1 via INTRAVENOUS

## 2021-07-08 MED ORDER — TECHNETIUM TC 99M TETROFOSMIN IV KIT: 1115.0000 | PACK | Freq: Once | INTRAVENOUS | Status: DC | PRN

## 2021-07-08 MED ORDER — REGADENOSON 0.4 MG/5ML IV SOLN
0.4000 mg | Freq: Once | INTRAVENOUS | Status: AC
  Administered 2021-07-08: 0.4 mg via INTRAVENOUS

## 2021-07-09 ENCOUNTER — Telehealth: Payer: Self-pay

## 2021-07-09 NOTE — Telephone Encounter (Signed)
-----   Message from Park Liter, MD sent at 07/09/2021  2:48 PM EDT ----- Stress test was normal

## 2021-07-09 NOTE — Telephone Encounter (Signed)
Spoke with patient regarding results and recommendation.  Patient verbalizes understanding and is agreeable to plan of care. Advised patient to call back with any issues or concerns.  

## 2021-07-10 ENCOUNTER — Telehealth: Payer: Self-pay

## 2021-07-10 NOTE — Telephone Encounter (Signed)
Patient called back, said the [employer] is a solicitation of services. She asked never to disclose any of her information to this company. Patient is aware as long as there is no consent on paper or on her DPR no medical information will be released by [facility].

## 2021-07-10 NOTE — Telephone Encounter (Signed)
Patient informed we received a fax from [employer] form a company name [employer] with no explanation of what is needed. Since there is no auth on file from [facility], I advised the patient to call them( number was provided) and see what is this inquiry about. Patient aware to call us if assistance is needed. I have the form on hand.

## 2021-07-13 ENCOUNTER — Other Ambulatory Visit: Payer: BC Managed Care – PPO

## 2021-07-14 ENCOUNTER — Ambulatory Visit (INDEPENDENT_AMBULATORY_CARE_PROVIDER_SITE_OTHER): Payer: BC Managed Care – PPO | Admitting: Orthopaedic Surgery

## 2021-07-14 ENCOUNTER — Encounter: Payer: Self-pay | Admitting: Orthopaedic Surgery

## 2021-07-14 DIAGNOSIS — M7061 Trochanteric bursitis, right hip: Secondary | ICD-10-CM | POA: Diagnosis not present

## 2021-07-14 DIAGNOSIS — M7062 Trochanteric bursitis, left hip: Secondary | ICD-10-CM | POA: Diagnosis not present

## 2021-07-14 MED ORDER — LIDOCAINE HCL 1 % IJ SOLN
3.0000 mL | INTRAMUSCULAR | Status: AC | PRN
  Administered 2021-07-14: 3 mL

## 2021-07-14 MED ORDER — METHYLPREDNISOLONE ACETATE 40 MG/ML IJ SUSP
40.0000 mg | INTRAMUSCULAR | Status: AC | PRN
  Administered 2021-07-14: 40 mg via INTRA_ARTICULAR

## 2021-07-14 NOTE — Progress Notes (Signed)
Office Visit Note   Patient: Jordan Russell           Date of Birth: December 09, 1970           MRN: [id number] Visit Date: 07/14/2021              Requested by: Josetta Huddle, MD [address] [place],  [place] [address] PCP: Josetta Huddle, MD   Assessment & Plan: Visit Diagnoses:  1. Trochanteric bursitis of left hip   2. Trochanteric bursitis, right hip     Plan: Per the patient's request I did provide a steroid injection over both hip trochanteric areas which she tolerated well.  I will see her back on a as needed basis.  I wished her luck for her upcoming surgery.  All questions and concerns were answered and addressed.  Follow-Up Instructions: Return if symptoms worsen or fail to improve.   Orders:  Orders Placed This Encounter  Procedures   Large Joint Inj   Large Joint Inj   Large Joint Inj   No orders of the defined types were placed in this encounter.     Procedures: Large Joint Inj: R greater trochanter on 07/14/2021 11:01 AM Indications: pain and diagnostic evaluation Details: 22 G 1.5 in needle, lateral approach  Arthrogram: No  Medications: 3 mL lidocaine 1 %; 40 mg methylPREDNISolone acetate 40 MG/ML Outcome: tolerated well, no immediate complications Procedure, treatment alternatives, risks and benefits explained, specific risks discussed. Consent was given by the patient. Immediately prior to procedure a time out was called to verify the correct patient, procedure, equipment, support staff and site/side marked as required. Patient was prepped and draped in the usual sterile fashion.    Large Joint Inj: L greater trochanter on 07/14/2021 11:01 AM Indications: pain and diagnostic evaluation Details: 22 G 1.5 in needle, lateral approach  Arthrogram: No  Medications: 3 mL lidocaine 1 %; 40 mg methylPREDNISolone acetate 40 MG/ML Outcome: tolerated well, no immediate complications Procedure, treatment alternatives, risks and benefits  explained, specific risks discussed. Consent was given by the patient. Immediately prior to procedure a time out was called to verify the correct patient, procedure, equipment, support staff and site/side marked as required. Patient was prepped and draped in the usual sterile fashion.      Clinical Data: No additional findings.   Subjective: Chief Complaint  Patient presents with   Right Hip - Follow-up   Left Hip - Follow-up  The patient is a 50 year old well-known to [place].  She has chronic bilateral hip bursitis.  She comes in today requesting steroid injection for both her hips.  Its been over 4 months now since she is had injections.  She actually is scheduled next week for surgery as it relates to her hiatal hernia.  This was with Dr. Greer Pickerel.  She is also had a cardiac work-up recently and states her ejection fraction is about 51%.  She has been having some fainting spells as well.  She still denies any groin pain and says her pain is still been consistent with trochanteric pain and injections have helped her greatly in the past.  HPI  Review of Systems   Objective: Vital Signs: There were no vitals taken for this visit.  Physical Exam She is alert and orient x3 and in no acute distress Ortho Exam Examination of both hips show the move smoothly and fluidly.  She has pain with palpation of the trochanteric area on both sides consistent with previous  trochanteric bursitis. Specialty Comments:  No specialty comments available.  Imaging: No results found.   PMFS History: Patient Active Problem List   Diagnosis Date Noted   Sinus bradycardia 06/29/2021   Hypotension 06/29/2021   Wears glasses 06/25/2021   Urgency of urination 06/25/2021   Thyroid disease 06/25/2021   Self mutilating behavior 06/25/2021   PONV (postoperative nausea and vomiting) 06/25/2021   Left ureteral stone 06/25/2021   History of kidney stones 06/25/2021   Hiatal hernia 06/25/2021    Gastroparesis 06/25/2021   GAD (generalized anxiety disorder) 06/25/2021   Morbid obesity ([place]) 05/22/2020   Cigarette nicotine dependence 05/22/2020   Microhematuria 01/04/2019   Delirium due to multiple etiologies 06/11/2018   Colitis 06/10/2018   Sepsis due to Escherichia coli ([place]) 06/10/2018   Jaundice 06/10/2018   Hypoalbuminemia 06/10/2018   HCAP (healthcare-associated pneumonia) 06/09/2018   Encephalopathy, hepatic ([place])    Acute liver failure without hepatic coma    E coli bacteremia    Preop cardiovascular exam    Severe malnutrition ([place])    Drug-induced liver injury 06/08/2018   Elevated LFTs 06/08/2018   Hyperammonemia ([place]) 06/08/2018   Anasarca 06/08/2018   Acute encephalopathy 06/05/2018   Hypoxia 06/05/2018   Hyperbilirubinemia 06/05/2018   Fatty infiltration of liver 06/05/2018   AKI (acute kidney injury) ([place]) 06/05/2018   Protein-calorie malnutrition, severe 06/05/2018   Severe sepsis ([place]) 06/04/2018   Trochanteric bursitis, left hip 09/08/2017   Pain in left hip 09/08/2017   Carpal tunnel syndrome of left wrist 12/17/2016   Dysphagia, pharyngoesophageal phase 12/17/2016   Calculus of kidney 12/17/2016   Hypothyroidism 12/17/2016   Hydronephrosis with renal and ureteral calculus obstruction 12/17/2016   Hydradenitis 12/17/2016   HPTH (hyperparathyroidism) ([place]) 12/17/2016   Nocturia 12/17/2016   Intractable migraine with aura without status migrainosus 12/17/2016   Pain in left knee 12/17/2016   Arthralgia of multiple joints 12/17/2016   Avitaminosis D 12/17/2016   Anemia, iron deficiency 12/17/2016   Cutaneous eruption 12/17/2016   Pain in joint 12/17/2016   Varicose veins of bilateral lower extremities with pain 06/28/2016   Adverse effects of medication 03/10/2016   Migraine 03/10/2016   IBS (irritable bowel syndrome) 03/10/2016   Anxiety 03/10/2016   GERD (gastroesophageal reflux disease) 03/10/2016   Epigastric pain 06/04/2015    Pharyngoesophageal dysphagia 02/10/2015   Bipolar disorder, unspecified ([place]) 08/03/2014   Rheumatoid arthritis ([place]) 08/03/2014   Fibromyalgia 08/03/2014   History of nutritional disorder 08/03/2014   Hidradenitis 08/03/2014   History of iron deficiency anemia 08/03/2014   Chondromalacia 02/26/2014   Bilateral knee pain 02/05/2014   Knee pain 02/05/2014   Vitamin D deficiency 12/14/2013   Status post bariatric surgery 12/14/2013   History of gastric bypass 12/14/2013   Postoperative state 12/14/2013   Past Medical History:  Diagnosis Date   Asthma    Bipolar 1 disorder ([place])    Chronic kidney disease    Depression    Fibromyalgia    GAD (generalized anxiety disorder)    Gastroparesis    GERD (gastroesophageal reflux disease)    Hiatal hernia    History of kidney stones    History of primary hyperparathyroidism    s/p  bilateral inferior parathyroidectomy 08-03-2010   Hypotension    Hypothyroidism    IBS (irritable bowel syndrome)    Left ureteral stone    Migraine    Pneumonia    PONV (postoperative nausea and vomiting)    and claustrophobic with mask   RA (  rheumatoid arthritis) ([place])    dr Gavin Pound-- rheumtologist   Self mutilating behavior    Thyroid disease    Urgency of urination    Wears glasses     Family History  Problem Relation Age of Onset   Bipolar disorder Son    Breast cancer Mother    Breast cancer Sister    Anxiety disorder Brother    Depression Brother    Breast cancer Maternal Aunt    Breast cancer Maternal Grandmother     Past Surgical History:  Procedure Laterality Date   ABDOMINAL HYSTERECTOMY     BALLOON DILATION N/A 10/01/2014   Procedure: BALLOON DILATION;  Surgeon: Garlan Fair, MD;  Location: [facility];  Service: Endoscopy;  Laterality: N/A;   BIOPSY  03/05/2021   Procedure: BIOPSY;  Surgeon: Ronnette Juniper, MD;  Location: [facility];  Service: Gastroenterology;;   CYSTO/  BILATERAL RETROGRADE PYELOGRAM  11/20/2000    CYSTO/  RIGHT RETROGRADE PYELOGRAM/  RIGHT URETEROSCOPY/  STENT PLACEMENT  12/16/2006   CYSTOSCOPY/URETEROSCOPY/HOLMIUM LASER/STENT PLACEMENT Left 11/09/2016   Procedure: CYSTOSCOPY/URETEROSCOPY/HOLMIUM LASER/STENT PLACEMENT;  Surgeon: Nickie Retort, MD;  Location: [facility];  Service: Urology;  Laterality: Left;  90 MINS  [phone]    ESOPHAGEAL MANOMETRY N/A 12/23/2014   Procedure: ESOPHAGEAL MANOMETRY (EM);  Surgeon: Garlan Fair, MD;  Location: [facility];  Service: Endoscopy;  Laterality: N/A;   ESOPHAGOGASTRODUODENOSCOPY  last one 03-28-2015   ESOPHAGOGASTRODUODENOSCOPY (EGD) WITH PROPOFOL N/A 10/01/2014   Procedure: ESOPHAGOGASTRODUODENOSCOPY (EGD) WITH PROPOFOL;  Surgeon: Garlan Fair, MD;  Location: [facility];  Service: Endoscopy;  Laterality: N/A;   ESOPHAGOGASTRODUODENOSCOPY (EGD) WITH PROPOFOL N/A 03/05/2021   Procedure: ESOPHAGOGASTRODUODENOSCOPY (EGD) WITH PROPOFOL;  Surgeon: Ronnette Juniper, MD;  Location: [facility];  Service: Gastroenterology;  Laterality: N/A;   EXTRACORPOREAL SHOCK WAVE LITHOTRIPSY  multiple times since age 11   HOLMIUM LASER APPLICATION Left [id number]   Procedure: HOLMIUM LASER APPLICATION;  Surgeon: Nickie Retort, MD;  Location: [facility];  Service: Urology;  Laterality: Left;   KNEE ARTHROSCOPY Right 03-04-2014  [facility]   w/ Arthrotomy   LAPAROSCOPIC ASSISTED VAGINAL HYSTERECTOMY  12/28/2005   LAPAROSCOPIC CHOLECYSTECTOMY  01/17/2004   LAPAROSCOPY LEFT OVARIAN CYSTECTOMY/  BILATERAL TUBAL LIGATION  02/26/2003   LEFT URETEROSCOPIC STONE EXTRACTION /  STENT PLACEMENT  07/09/2002   NECK EXPLORATION/  BILATERAL INFERIOR PARATHYROIDECTOMY  08/03/2010   RIGHT URETERAL DILATION/  URETEROSCOPIC STONE EXTRACTION  06/12/2010   ROUX-EN-Y GASTRIC BYPASS  2001   SOLYX TRANSURETHRAL SLING/  POSTERIOR PELVIC FLOOR SACROSPINOUS REPAIR  02/21/2009   and Cysto/  Bilateral ureteral stent placement   TONSILLECTOMY AND  ADENOIDECTOMY     WRIST GANGLION EXCISION Right 01/28/2000   Social History   Occupational History   Occupation: disabled   Tobacco Use   Smoking status: Every Day    Packs/day: 0.25    Years: 20.00    Pack years: 5.00    Types: Cigarettes   Smokeless tobacco: Never  Vaping Use   Vaping Use: Never used  Substance and Sexual Activity   Alcohol use: Yes    Comment: occ   Drug use: No   Sexual activity: Not on file

## 2021-07-15 ENCOUNTER — Telehealth: Payer: Self-pay | Admitting: Cardiology

## 2021-07-15 NOTE — Telephone Encounter (Signed)
   [facility] Pre-operative Risk Assessment    Patient Name: Jordan Russell  DOB: [birth year] MRN: [id number]  HEARTCARE STAFF:  - IMPORTANT!!!!!! Under Visit Info/Reason for Call, type in Other and utilize the format Clearance MM/DD/YY or Clearance TBD. Do not use dashes or single digits. - Please review there is not already an duplicate clearance open for this procedure. - If request is for dental extraction, please clarify the # of teeth to be extracted. - If the patient is currently at the dentist's office, call Pre-Op Callback Staff (MA/nurse) to input urgent request.  - If the patient is not currently in the dentist office, please route to the Pre-Op pool.  Request for surgical clearance:  What type of surgery is being performed? Robotic repair of Hiatal Hernia   When is this surgery scheduled? 07/21/21  What type of clearance is required (medical clearance vs. Pharmacy clearance to hold med vs. Both)? medical  Are there any medications that need to be held prior to surgery and how long? TBD by Cardiology  Practice name and name of physician performing surgery? Dr. Greer Pickerel, [facility]   What is the office phone number? [phone]    7.   What is the office fax number? [phone]  8.   Anesthesia type (None, local, MAC, general) ? General    Johnna Acosta 07/15/2021, 9:53 AM  _________________________________________________________________   (provider comments below)

## 2021-07-15 NOTE — Telephone Encounter (Signed)
Pt with heart monitor in place. Will await these results. Placed 06/29/21.

## 2021-07-16 NOTE — Telephone Encounter (Signed)
Contacted by hospital PA C regarding upcoming surgery.  Stress test low risk.  Echo still pending.  Heart monitor pending results.  Will await results of pending studies.

## 2021-07-16 NOTE — Telephone Encounter (Signed)
   Pt is calling, she asked why she needs echo to be cleared for surgery, she said her insurance wont cover it and she needs to get her surgery

## 2021-07-17 NOTE — Telephone Encounter (Signed)
Returned the call to the pt.  Left a message that her Echocardiogram wasn't based upon whether she will or want be cleared for surgery, but it was recommended that she does have it.  Also let her know that we are awaiting the monitor results before a decision can be made.

## 2021-07-20 NOTE — Telephone Encounter (Signed)
   Primary Cardiologist: Jenne Campus, MD  Chart reviewed as part of pre-operative protocol coverage. Given past medical history and time since last visit, based on ACC/AHA guidelines, Jordan Russell would be at acceptable risk for the planned procedure without further cardiovascular testing.   Her RCRI is a class II risk, 0.9% risk of major cardiac event.  I will route this recommendation to the requesting party via Epic fax function and remove from pre-op pool.  Please call with questions.  Jossie Ng. Zeyad Delaguila NP-C    07/20/2021, 9:09 AM [address] [place] Office [phone] Fax [phone]

## 2021-07-23 ENCOUNTER — Telehealth: Payer: Self-pay

## 2021-07-23 DIAGNOSIS — G933 Postviral fatigue syndrome: Secondary | ICD-10-CM | POA: Diagnosis not present

## 2021-07-23 DIAGNOSIS — M62838 Other muscle spasm: Secondary | ICD-10-CM | POA: Diagnosis not present

## 2021-07-23 NOTE — Telephone Encounter (Signed)
Spoke with patient regarding results and recommendation.  Patient verbalizes understanding and is agreeable to plan of care. Advised patient to call back with any issues or concerns.  

## 2021-07-23 NOTE — Telephone Encounter (Signed)
-----   Message from Park Liter, MD sent at 07/23/2021  1:24 PM EDT ----- Monitor show only 1 run of supraventricular tachycardia which was asymptomatic, no need to treat

## 2021-07-25 ENCOUNTER — Other Ambulatory Visit: Payer: Self-pay

## 2021-07-25 ENCOUNTER — Emergency Department (HOSPITAL_BASED_OUTPATIENT_CLINIC_OR_DEPARTMENT_OTHER)
Admission: EM | Admit: 2021-07-25 | Discharge: 2021-07-25 | Disposition: A | Discharge status: Home / Self Care | Payer: BC Managed Care – PPO | Attending: Emergency Medicine | Admitting: Emergency Medicine

## 2021-07-25 ENCOUNTER — Encounter (HOSPITAL_BASED_OUTPATIENT_CLINIC_OR_DEPARTMENT_OTHER): Payer: Self-pay

## 2021-07-25 DIAGNOSIS — E039 Hypothyroidism, unspecified: Secondary | ICD-10-CM | POA: Insufficient documentation

## 2021-07-25 DIAGNOSIS — F17210 Nicotine dependence, cigarettes, uncomplicated: Secondary | ICD-10-CM | POA: Diagnosis not present

## 2021-07-25 DIAGNOSIS — E860 Dehydration: Secondary | ICD-10-CM | POA: Diagnosis not present

## 2021-07-25 DIAGNOSIS — I129 Hypertensive chronic kidney disease with stage 1 through stage 4 chronic kidney disease, or unspecified chronic kidney disease: Secondary | ICD-10-CM | POA: Diagnosis not present

## 2021-07-25 DIAGNOSIS — J45909 Unspecified asthma, uncomplicated: Secondary | ICD-10-CM | POA: Insufficient documentation

## 2021-07-25 DIAGNOSIS — R5383 Other fatigue: Secondary | ICD-10-CM | POA: Insufficient documentation

## 2021-07-25 DIAGNOSIS — R252 Cramp and spasm: Secondary | ICD-10-CM | POA: Diagnosis not present

## 2021-07-25 DIAGNOSIS — R531 Weakness: Secondary | ICD-10-CM | POA: Diagnosis not present

## 2021-07-25 DIAGNOSIS — Z8616 Personal history of COVID-19: Secondary | ICD-10-CM | POA: Diagnosis not present

## 2021-07-25 DIAGNOSIS — N183 Chronic kidney disease, stage 3 unspecified: Secondary | ICD-10-CM | POA: Diagnosis not present

## 2021-07-25 LAB — COMPREHENSIVE METABOLIC PANEL
ALT: 16 U/L (ref 0–44)
AST: 16 U/L (ref 15–41)
Albumin: 4.5 g/dL (ref 3.5–5.0)
Alkaline Phosphatase: 127 U/L — ABNORMAL HIGH (ref 38–126)
Anion gap: 7 (ref 5–15)
BUN: 21 mg/dL — ABNORMAL HIGH (ref 6–20)
CO2: 26 mmol/L (ref 22–32)
Calcium: 9.6 mg/dL (ref 8.9–10.3)
Chloride: 101 mmol/L (ref 98–111)
Creatinine, Ser: 1.09 mg/dL — ABNORMAL HIGH (ref 0.44–1.00)
GFR, Estimated: >60 mL/min (ref >60)
Glucose, Bld: 93 mg/dL (ref 70–99)
Potassium: 4.5 mmol/L (ref 3.5–5.1)
Sodium: 134 mmol/L — ABNORMAL LOW (ref 135–145)
Total Bilirubin: 0.3 mg/dL (ref 0.3–1.2)
Total Protein: 6.5 g/dL (ref 6.5–8.1)

## 2021-07-25 LAB — CBC WITH DIFFERENTIAL/PLATELET
Abs Immature Granulocytes: 0.1 10*3/uL — ABNORMAL HIGH (ref 0.00–0.07)
Basophils Absolute: 0.1 10*3/uL (ref 0.0–0.1)
Basophils Relative: 1 %
Eosinophils Absolute: 0.3 10*3/uL (ref 0.0–0.5)
Eosinophils Relative: 3 %
HCT: 44.7 % (ref 36.0–46.0)
Hemoglobin: 14.8 g/dL (ref 12.0–15.0)
Immature Granulocytes: 1 %
Lymphocytes Relative: 29 %
Lymphs Abs: 3.1 10*3/uL (ref 0.7–4.0)
MCH: 32.5 pg (ref 26.0–34.0)
MCHC: 33.1 g/dL (ref 30.0–36.0)
MCV: 98 fL (ref 80.0–100.0)
Monocytes Absolute: 0.6 10*3/uL (ref 0.1–1.0)
Monocytes Relative: 6 %
Neutro Abs: 6.5 10*3/uL (ref 1.7–7.7)
Neutrophils Relative %: 60 %
Platelets: 284 10*3/uL (ref 150–400)
RBC: 4.56 MIL/uL (ref 3.87–5.11)
RDW: 13 % (ref 11.5–15.5)
WBC: 10.7 10*3/uL — ABNORMAL HIGH (ref 4.0–10.5)
nRBC: 0 % (ref 0.0–0.2)

## 2021-07-25 LAB — CK: Total CK: 130 U/L (ref 38–234)

## 2021-07-25 LAB — LIPASE, BLOOD: Lipase: 52 U/L — ABNORMAL HIGH (ref 11–51)

## 2021-07-25 MED ORDER — KETOROLAC TROMETHAMINE 30 MG/ML IJ SOLN
30.0000 mg | Freq: Once | INTRAMUSCULAR | Status: AC
  Administered 2021-07-25: 30 mg via INTRAVENOUS
  Filled 2021-07-25: qty 1

## 2021-07-25 MED ORDER — ONDANSETRON HCL 4 MG/2ML IJ SOLN
4.0000 mg | Freq: Once | INTRAMUSCULAR | Status: AC
  Administered 2021-07-25: 4 mg via INTRAVENOUS
  Filled 2021-07-25: qty 2

## 2021-07-25 MED ORDER — SODIUM CHLORIDE 0.9 % IV BOLUS
1000.0000 mL | Freq: Once | INTRAVENOUS | Status: AC
  Administered 2021-07-25: 1000 mL via INTRAVENOUS

## 2021-07-25 NOTE — ED Triage Notes (Addendum)
Presents with cramping to hands and feet.  Reports her stomach is above her diaphragm and suppose to have surgery.  Also complains that she can't keep any food or water down and cant urinate.  Reports she might urinate once a day and it is dark brown.  Dx with covid on 07/16/21

## 2021-07-25 NOTE — ED Notes (Signed)
Pt verbalizes understanding of discharge instructions. Opportunity for questioning and answers were provided. [place] removed by staff, pt discharged from ED to home. Educated to f/u with PCP.  

## 2021-07-25 NOTE — Discharge Instructions (Addendum)
Drink plenty of fluids and get plenty of rest.  Follow-up with primary doctor if not improving in the next few days, and return to the ER if symptoms significantly worsen or change.

## 2021-07-25 NOTE — ED Provider Notes (Signed)
[facility] EMERGENCY DEPT Provider Note   CSN: [id number] Arrival date & time: 07/25/21  0201     History Chief Complaint  Patient presents with   Weakness    Jordan Russell is a 50 y.o. female.  Patient is a 50 year old female with past medical history of bipolar, fibromyalgia, chronic renal insufficiency, asthma, hypertension, hypothyroidism, irritable bowel.  Patient presenting today for evaluation of weakness and fatigue.  She was diagnosed with COVID-19 10 days ago.  She describes decreased oral intake secondary to prior gastric bypass surgery with some sort of esophageal dysmotility.  For the past 2 days, she describes cramping in her hands and feet.  She also describes decreased urination in the urine that she does produces dark in color.  She denies to me she has having any fevers or chills.  She denies chest pain or difficulty breathing.  The history is provided by the patient.  Weakness Severity:  Moderate Onset quality:  Gradual Duration:  3 days Timing:  Constant Progression:  Worsening Chronicity:  New Relieved by:  Nothing Worsened by:  Nothing Ineffective treatments:  None tried     Past Medical History:  Diagnosis Date   Asthma    Bipolar 1 disorder ([place])    Chronic kidney disease    Depression    Fibromyalgia    GAD (generalized anxiety disorder)    Gastroparesis    GERD (gastroesophageal reflux disease)    Hiatal hernia    History of kidney stones    History of primary hyperparathyroidism    s/p  bilateral inferior parathyroidectomy 08-03-2010   Hypotension    Hypothyroidism    IBS (irritable bowel syndrome)    Left ureteral stone    Migraine    Pneumonia    PONV (postoperative nausea and vomiting)    and claustrophobic with mask   RA (rheumatoid arthritis) ([place])    dr Gavin Pound-- rheumtologist   Self mutilating behavior    Thyroid disease    Urgency of urination    Wears glasses     Patient Active Problem List    Diagnosis Date Noted   Sinus bradycardia 06/29/2021   Hypotension 06/29/2021   Wears glasses 06/25/2021   Urgency of urination 06/25/2021   Thyroid disease 06/25/2021   Self mutilating behavior 06/25/2021   PONV (postoperative nausea and vomiting) 06/25/2021   Left ureteral stone 06/25/2021   History of kidney stones 06/25/2021   Hiatal hernia 06/25/2021   Gastroparesis 06/25/2021   GAD (generalized anxiety disorder) 06/25/2021   Morbid obesity ([place]) 05/22/2020   Cigarette nicotine dependence 05/22/2020   Microhematuria 01/04/2019   Delirium due to multiple etiologies 06/11/2018   Colitis 06/10/2018   Sepsis due to Escherichia coli ([place]) 06/10/2018   Jaundice 06/10/2018   Hypoalbuminemia 06/10/2018   HCAP (healthcare-associated pneumonia) 06/09/2018   Encephalopathy, hepatic ([place])    Acute liver failure without hepatic coma    E coli bacteremia    Preop cardiovascular exam    Severe malnutrition ([place])    Drug-induced liver injury 06/08/2018   Elevated LFTs 06/08/2018   Hyperammonemia ([place]) 06/08/2018   Anasarca 06/08/2018   Acute encephalopathy 06/05/2018   Hypoxia 06/05/2018   Hyperbilirubinemia 06/05/2018   Fatty infiltration of liver 06/05/2018   AKI (acute kidney injury) ([place]) 06/05/2018   Protein-calorie malnutrition, severe 06/05/2018   Severe sepsis ([place]) 06/04/2018   Trochanteric bursitis, left hip 09/08/2017   Pain in left hip 09/08/2017   Carpal tunnel syndrome of left wrist 12/17/2016  Dysphagia, pharyngoesophageal phase 12/17/2016   Calculus of kidney 12/17/2016   Hypothyroidism 12/17/2016   Hydronephrosis with renal and ureteral calculus obstruction 12/17/2016   Hydradenitis 12/17/2016   HPTH (hyperparathyroidism) ([place]) 12/17/2016   Nocturia 12/17/2016   Intractable migraine with aura without status migrainosus 12/17/2016   Pain in left knee 12/17/2016   Arthralgia of multiple joints 12/17/2016   Avitaminosis D 12/17/2016   Anemia, iron deficiency  12/17/2016   Cutaneous eruption 12/17/2016   Pain in joint 12/17/2016   Varicose veins of bilateral lower extremities with pain 06/28/2016   Adverse effects of medication 03/10/2016   Migraine 03/10/2016   IBS (irritable bowel syndrome) 03/10/2016   Anxiety 03/10/2016   GERD (gastroesophageal reflux disease) 03/10/2016   Epigastric pain 06/04/2015   Pharyngoesophageal dysphagia 02/10/2015   Bipolar disorder, unspecified ([place]) 08/03/2014   Rheumatoid arthritis ([place]) 08/03/2014   Fibromyalgia 08/03/2014   History of nutritional disorder 08/03/2014   Hidradenitis 08/03/2014   History of iron deficiency anemia 08/03/2014   Chondromalacia 02/26/2014   Bilateral knee pain 02/05/2014   Knee pain 02/05/2014   Vitamin D deficiency 12/14/2013   Status post bariatric surgery 12/14/2013   History of gastric bypass 12/14/2013   Postoperative state 12/14/2013    Past Surgical History:  Procedure Laterality Date   ABDOMINAL HYSTERECTOMY     BALLOON DILATION N/A 10/01/2014   Procedure: BALLOON DILATION;  Surgeon: Garlan Fair, MD;  Location: [facility];  Service: Endoscopy;  Laterality: N/A;   BIOPSY  03/05/2021   Procedure: BIOPSY;  Surgeon: Ronnette Juniper, MD;  Location: [facility];  Service: Gastroenterology;;   CYSTO/  BILATERAL RETROGRADE PYELOGRAM  11/20/2000   CYSTO/  RIGHT RETROGRADE PYELOGRAM/  RIGHT URETEROSCOPY/  STENT PLACEMENT  12/16/2006   CYSTOSCOPY/URETEROSCOPY/HOLMIUM LASER/STENT PLACEMENT Left 11/09/2016   Procedure: CYSTOSCOPY/URETEROSCOPY/HOLMIUM LASER/STENT PLACEMENT;  Surgeon: Nickie Retort, MD;  Location: [facility];  Service: Urology;  Laterality: Left;  90 MINS  [phone]    ESOPHAGEAL MANOMETRY N/A 12/23/2014   Procedure: ESOPHAGEAL MANOMETRY (EM);  Surgeon: Garlan Fair, MD;  Location: [facility];  Service: Endoscopy;  Laterality: N/A;   ESOPHAGOGASTRODUODENOSCOPY  last one 03-28-2015   ESOPHAGOGASTRODUODENOSCOPY (EGD) WITH PROPOFOL  N/A 10/01/2014   Procedure: ESOPHAGOGASTRODUODENOSCOPY (EGD) WITH PROPOFOL;  Surgeon: Garlan Fair, MD;  Location: [facility];  Service: Endoscopy;  Laterality: N/A;   ESOPHAGOGASTRODUODENOSCOPY (EGD) WITH PROPOFOL N/A 03/05/2021   Procedure: ESOPHAGOGASTRODUODENOSCOPY (EGD) WITH PROPOFOL;  Surgeon: Ronnette Juniper, MD;  Location: [facility];  Service: Gastroenterology;  Laterality: N/A;   EXTRACORPOREAL SHOCK WAVE LITHOTRIPSY  multiple times since age 58   HOLMIUM LASER APPLICATION Left 1/0/9323   Procedure: HOLMIUM LASER APPLICATION;  Surgeon: Nickie Retort, MD;  Location: [facility];  Service: Urology;  Laterality: Left;   KNEE ARTHROSCOPY Right 03-04-2014  [facility]   w/ Arthrotomy   LAPAROSCOPIC ASSISTED VAGINAL HYSTERECTOMY  12/28/2005   LAPAROSCOPIC CHOLECYSTECTOMY  01/17/2004   LAPAROSCOPY LEFT OVARIAN CYSTECTOMY/  BILATERAL TUBAL LIGATION  02/26/2003   LEFT URETEROSCOPIC STONE EXTRACTION /  STENT PLACEMENT  07/09/2002   NECK EXPLORATION/  BILATERAL INFERIOR PARATHYROIDECTOMY  08/03/2010   RIGHT URETERAL DILATION/  URETEROSCOPIC STONE EXTRACTION  06/12/2010   ROUX-EN-Y GASTRIC BYPASS  2001   SOLYX TRANSURETHRAL SLING/  POSTERIOR PELVIC FLOOR SACROSPINOUS REPAIR  02/21/2009   and Cysto/  Bilateral ureteral stent placement   TONSILLECTOMY AND ADENOIDECTOMY     WRIST GANGLION EXCISION Right 01/28/2000     OB History   No obstetric  history on file.     Family History  Problem Relation Age of Onset   Bipolar disorder Son    Breast cancer Mother    Breast cancer Sister    Anxiety disorder Brother    Depression Brother    Breast cancer Maternal Aunt    Breast cancer Maternal Grandmother     Social History   Tobacco Use   Smoking status: Every Day    Packs/day: 0.25    Years: 20.00    Pack years: 5.00    Types: Cigarettes   Smokeless tobacco: Never  Vaping Use   Vaping Use: Never used  Substance Use Topics   Alcohol use: Yes    Comment: occ    Drug use: No    Home Medications Prior to Admission medications   Medication Sig Start Date End Date Taking? Authorizing Provider  ALPRAZolam Duanne Moron) 0.5 MG tablet Take 0.5 mg by mouth 3 (three) times daily as needed for anxiety. 11/19/18   [provider]  Carboxymeth-Glyc-Polysorb PF (REFRESH OPTIVE MEGA-3) 0.5-1-0.5 % SOLN Place 1 drop into both eyes 3 (three) times daily as needed (dry/irritated eyes.).    [provider]  dicyclomine (BENTYL) 20 MG tablet Take 20 mg by mouth 3 (three) times daily as needed for spasms. 09/16/20   [provider]  escitalopram (LEXAPRO) 20 MG tablet Take 20 mg by mouth in the morning. 02/10/21   [provider]  esomeprazole (NEXIUM) 40 MG capsule Take 40 mg by mouth 2 (two) times daily. 02/10/21   [provider]  folic acid (FOLVITE) 1 MG tablet Take 1 mg by mouth in the morning. 02/04/21   [provider]  furosemide (LASIX) 20 MG tablet Take 20 mg by mouth in the morning. 02/08/19   [provider]  GEODON 80 MG capsule Take 80 mg by mouth 2 (two) times daily. 02/11/21   [provider]  ibuprofen (ADVIL) 200 MG tablet Take 800 mg by mouth every 8 (eight) hours as needed for moderate pain or headache (migraine headache prevention).    [provider]  lactulose (CHRONULAC) 10 GM/15ML solution Place 45 mLs (30 g total) into feeding tube every 6 (six) hours. Patient taking differently: Place 30 g into feeding tube daily as needed for mild constipation, moderate constipation or severe constipation (constipation). 06/09/18   Shelly Coss, MD  levothyroxine (SYNTHROID) 88 MCG tablet Take 88 mcg by mouth daily before breakfast. 12/19/20   [provider]  LINZESS 290 MCG CAPS capsule Take 290 mcg by mouth daily before breakfast. 12/01/20   [provider]  lithium 300 MG tablet Take 300 mg by mouth 2 (two) times daily. 02/01/19   [provider]  ondansetron  (ZOFRAN-ODT) 8 MG disintegrating tablet Take 8 mg by mouth every 6 (six) hours as needed for nausea. 01/15/21   [provider]  rifaximin (XIFAXAN) 550 MG TABS tablet Take 550 mg by mouth 2 (two) times daily.    [provider]  rizatriptan (MAXALT) 10 MG tablet Take 10 mg by mouth every 2 (two) hours as needed for migraine. 11/10/20   [provider]  sertraline (ZOLOFT) 100 MG tablet Take 200 mg by mouth daily. 02/14/19   [provider]  spironolactone (ALDACTONE) 50 MG tablet Take 50 mg by mouth in the morning. 06/23/18   [provider]  topiramate (TOPAMAX) 25 MG tablet Take 25 mg by mouth 2 (two) times daily. 02/20/21   [provider]  traZODone (DESYREL) 100 MG tablet Take 200 mg by mouth at bedtime. 02/11/21   [provider]    Allergies    Gabapentin, Sulfasalazine, Lamictal [lamotrigine], Penicillins, Sulfa antibiotics, Tylenol [acetaminophen], and Morphine and related  Review of Systems   Review of Systems  Neurological:  Positive for weakness.  All other systems reviewed and are negative.  Physical Exam Updated Vital Signs BP 115/76 (BP Location: Right Arm)   Pulse (!) 57   Temp 98.2 F (36.8 C) (Oral)   Resp 16   Ht 5\' 6"  (1.676 m)   Wt 78 kg   SpO2 99%   BMI 27.76 kg/m   Physical Exam Vitals and nursing note reviewed.  Constitutional:      General: She is not in acute distress.    Appearance: She is well-developed. She is not diaphoretic.  HENT:     Head: Normocephalic and atraumatic.     Mouth/Throat:     Mouth: Mucous membranes are moist.     Pharynx: No oropharyngeal exudate or posterior oropharyngeal erythema.  Cardiovascular:     Rate and Rhythm: Normal rate and regular rhythm.     Heart sounds: No murmur heard.   No friction rub. No gallop.  Pulmonary:     Effort: Pulmonary effort is normal. No respiratory distress.     Breath sounds: Normal breath sounds. No wheezing.  Abdominal:      General: Bowel sounds are normal. There is no distension.     Palpations: Abdomen is soft.     Tenderness: There is no abdominal tenderness.  Musculoskeletal:        General: Normal range of motion.     Cervical back: Normal range of motion and neck supple.  Skin:    General: Skin is warm and dry.     Coloration: Skin is not jaundiced or pale.  Neurological:     General: No focal deficit present.     Mental Status: She is alert and oriented to person, place, and time.    ED Results / Procedures / Treatments   Labs (all labs ordered are listed, but only abnormal results are displayed) Labs Reviewed  COMPREHENSIVE METABOLIC PANEL  LIPASE, BLOOD  CBC WITH DIFFERENTIAL/PLATELET  CK  URINALYSIS, ROUTINE W REFLEX MICROSCOPIC    EKG ED ECG REPORT   Date: 07/25/2021  Rate: 58  Rhythm: normal sinus rhythm  QRS Axis: normal  Intervals: normal  ST/T Wave abnormalities: early repolarization  Conduction Disutrbances:none  Narrative Interpretation:   Old EKG Reviewed: none available  I have personally reviewed the EKG tracing and agree with the computerized printout as noted.   Radiology No results found.  Procedures Procedures   Medications Ordered in ED Medications  sodium chloride 0.9 % bolus 1,000 mL (has no administration in time range)  ondansetron (ZOFRAN) injection 4 mg (has no administration in time range)  ketorolac (TORADOL) 30 MG/ML injection 30 mg (has no administration in time range)    ED Course  I have reviewed the triage vital signs and the nursing notes.  Pertinent labs & imaging results that were available during my care of the patient were reviewed by me and considered in my medical decision making (see chart for details).    MDM Rules/Calculators/A&P  Patient presenting here with cramping in her hands and feet.  She was diagnosed with COVID-19 10 days ago and feels as though she may be dehydrated.  Patient's vital signs and laboratory studies are  reassuring.  She was  hydrated with normal saline and is feeling much better and is actually requesting to go home.  This seems reasonable and patient will be discharged with as needed return.  Final Clinical Impression(s) / ED Diagnoses Final diagnoses:  None    Rx / DC Orders ED Discharge Orders     None        Veryl Speak, MD 07/25/21 0403

## 2021-07-30 ENCOUNTER — Other Ambulatory Visit (INDEPENDENT_AMBULATORY_CARE_PROVIDER_SITE_OTHER): Payer: BC Managed Care – PPO

## 2021-07-30 ENCOUNTER — Encounter: Payer: Self-pay | Admitting: Neurology

## 2021-07-30 ENCOUNTER — Ambulatory Visit (INDEPENDENT_AMBULATORY_CARE_PROVIDER_SITE_OTHER): Payer: BC Managed Care – PPO | Admitting: Neurology

## 2021-07-30 ENCOUNTER — Other Ambulatory Visit: Payer: Self-pay

## 2021-07-30 VITALS — BP 93/67 | HR 79 | Ht 66.0 in | Wt 174.0 lb

## 2021-07-30 DIAGNOSIS — E639 Nutritional deficiency, unspecified: Secondary | ICD-10-CM

## 2021-07-30 DIAGNOSIS — G5731 Lesion of lateral popliteal nerve, right lower limb: Secondary | ICD-10-CM | POA: Diagnosis not present

## 2021-07-30 DIAGNOSIS — R413 Other amnesia: Secondary | ICD-10-CM | POA: Diagnosis not present

## 2021-07-30 LAB — B12 AND FOLATE PANEL
Folate: 6 ng/mL (ref >5.9)
Vitamin B-12: 258 pg/mL (ref 211–911)

## 2021-07-30 NOTE — Patient Instructions (Signed)
MRI brain without contrast

## 2021-07-30 NOTE — Progress Notes (Signed)
Follow-up Visit   Date: 07/30/21   Jordan Russell MRN: [id number] DOB: February 13, 1971   Interim History: Jordan Russell is a 50 y.o. right-handed Caucasian female with hypothyroidism, GERD, hypertension, bipolar depression, hyperparathyroidism, fibromyalgia, rheumatoid arthritis, irritable bowel syndrome, history of gastric bypass with malnutrition and copper deficiency returning to the clinic for follow-up of right peroneal neuropathy, memory loss, and parethesias of the legs.  The patient was accompanied to the clinic by self.     History of present illness: In August 2019, she was hospitalized for sepsis, hepatic encephalopathy, and drug-induced liver injury (plaquenil/eternacept) from 8/4 - 06/09/2018 at [facility] and transferred to [facility] where she stayed 8/9 - 06/23/2018.  Hospitalization was notable for healthcare associated pneumonia, E.coli sepsis, delirium, and liver injury.  She was transferred to rehab facility and was noted to have severe generalized weakness, diagnosed with critical illness myopathy.  She completed PT and slowly improved where she was able to stand and able to walk with a walker.  During this time, she noticed that her right foot was weak, specifically, difficulty raising her right foot.  She does not have similar weakness on the left foot.  She has numbness of the right great toe and middle toe.  She has numbness over the right lateral thigh for many years.  She has chronic low back pain, which is localized. She does not have radicular pain of the legs. She admits to crossing her legs.  She had NCS/EMG of the right leg (results not available to review).    She has been using a walker since Spring 2019 because of generalized weakness and falls.  She underwent gastric bypass surgery in 2001 and lost 150lb.  She admits to having very poor appetite and continues to eat only one meal/daily.  She is active and spends 60-min on her stationary bike daily.  She has fear  of gaining weight.    She has previously been evaluated evaluated at [facility] neurology by Dr. Tommas Olp for leg weakness and cognitive complaints in 2019.  MRI brain dated 03/09/2018 showed mild generalized volume loss, no focal findings and was recommended to undergo neuropsychological testing.   UPDATE 07/30/2021:  She was last seen in 2020 for right peroneal mononeuropathy and multiple nutritional deficiencies.  She was lost to follow-up and presents today with weakness and tingling of the hands and legs, muscle cramps, and memory loss.   Starting March 2022, she began having weakness and tingling in both legs and hands.  She also feels burning and itching deep inside her legs.  She also complains of painful muscle spasms and cramps of the hands and feet. Previously labs indicated multiple nutrient deficiency including vitamin B12, vitamin B1, and copper.   She has not been taking supplements for quite some time. She still has poor nutrition and does not eat regular meals.   She is also having problems with her memory.  She walks into rooms and is confused why she is there.  She is unable to read books anymore, because she is unable to recall the plot.  She gets confused and constant repeating herself.  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  She lives at home with her husband and son.  She smokes 1 PPD and is not interested in quitting.  She occasionally drinks beer.   Medications:  Current Outpatient Medications on File Prior to Visit  Medication Sig Dispense Refill   ALPRAZolam (XANAX) 0.5 MG  tablet Take 0.5 mg by mouth 3 (three) times daily as needed for anxiety.     Carboxymeth-Glyc-Polysorb PF (REFRESH OPTIVE MEGA-3) 0.5-1-0.5 % SOLN Place 1 drop into both eyes 3 (three) times daily as needed (dry/irritated eyes.).     dicyclomine (BENTYL) 20 MG tablet Take 20 mg by mouth 3 (three) times daily as needed for spasms.     escitalopram (LEXAPRO) 20 MG tablet Take 20 mg by  mouth in the morning.     esomeprazole (NEXIUM) 40 MG capsule Take 40 mg by mouth 2 (two) times daily.     folic acid (FOLVITE) 1 MG tablet Take 1 mg by mouth in the morning.     furosemide (LASIX) 20 MG tablet Take 20 mg by mouth in the morning.     ibuprofen (ADVIL) 200 MG tablet Take 800 mg by mouth every 8 (eight) hours as needed for moderate pain or headache (migraine headache prevention).     lactulose (CHRONULAC) 10 GM/15ML solution Place 45 mLs (30 g total) into feeding tube every 6 (six) hours. (Patient taking differently: Place 30 g into feeding tube daily as needed for mild constipation, moderate constipation or severe constipation (constipation).) 240 mL 0   levothyroxine (SYNTHROID) 88 MCG tablet Take 88 mcg by mouth daily before breakfast.     LINZESS 290 MCG CAPS capsule Take 290 mcg by mouth daily before breakfast.     lithium 300 MG tablet Take 300 mg by mouth 2 (two) times daily.     ondansetron (ZOFRAN-ODT) 8 MG disintegrating tablet Take 8 mg by mouth every 6 (six) hours as needed for nausea.     rifaximin (XIFAXAN) 550 MG TABS tablet Take 550 mg by mouth 2 (two) times daily.     rizatriptan (MAXALT) 10 MG tablet Take 10 mg by mouth every 2 (two) hours as needed for migraine.     spironolactone (ALDACTONE) 50 MG tablet Take 50 mg by mouth in the morning.     topiramate (TOPAMAX) 25 MG tablet Take 25 mg by mouth 2 (two) times daily.     traZODone (DESYREL) 100 MG tablet Take 200 mg by mouth at bedtime.     No current facility-administered medications on file prior to visit.    Allergies:  Allergies  Allergen Reactions   Gabapentin Other (See Comments)    Leg swelling    Sulfasalazine Hives   Lamictal [Lamotrigine] Nausea And Vomiting and Other (See Comments)    Tremors, diplopia   Penicillins Hives and Itching    As a child, "breathing, itching problem" Has patient had a PCN reaction causing immediate rash, facial/tongue/throat swelling, SOB or lightheadedness with  hypotension: Yes Has patient had a PCN reaction causing severe rash involving mucus membranes or skin necrosis: Yes Has patient had a PCN reaction that required hospitalization No Has patient had a PCN reaction occurring within the last 10 years: No If all of the above answers are "NO", then may proceed with Cephalosporin use.    Sulfa Antibiotics Hives   Tylenol [Acetaminophen]     Due to liver failure    Morphine And Related Itching and Rash    Vital Signs:  BP 93/67   Pulse 79   Ht 5\' 6"  (1.676 m)   Wt 174 lb (78.9 kg)   SpO2 96%   BMI 28.08 kg/m     Neurological Exam: MENTAL STATUS including orientation to time, place, person, recent and remote memory, attention span and concentration, language, and fund of knowledge  is normal.  Speech is not dysarthric.  Montreal Cognitive Assessment  07/30/2021  Visuospatial/ Executive (0/5) 5  Naming (0/3) 3  Attention: Read list of digits (0/2) 2  Attention: Read list of letters (0/1) 1  Attention: Serial 7 subtraction starting at 100 (0/3) 3  Language: Repeat phrase (0/2) 2  Language : Fluency (0/1) 1  Abstraction (0/2) 2  Delayed Recall (0/5) 2  Orientation (0/6) 5  Total 26  Adjusted Score (based on education) 26     CRANIAL NERVES:  No visual field defects.  Pupils equal round and reactive to light.  Normal conjugate, extra-ocular eye movements in all directions of gaze.  No ptosis.  Face is symmetric. Palate elevates symmetrically.  Tongue is midline.  MOTOR:  No atrophy, fasciculations or abnormal movements.  No pronator drift.   Upper Extremity:  Right  Left  Deltoid  5/5   5/5   Biceps  5/5   5/5   Triceps  5/5   5/5   Infraspinatus 5/5  5/5  Medial pectoralis 5/5  5/5  Wrist extensors  5/5   5/5   Wrist flexors  5/5   5/5   Finger extensors  5/5   5/5   Finger flexors  5/5   5/5   Dorsal interossei  5/5   5/5   Abductor pollicis  5/5   5/5   Tone (Ashworth scale)  0  0   Lower Extremity:  Right  Left  Hip  flexors  5/5   5/5   Hip extensors  5/5   5/5   Adductor 5/5  5/5  Abductor 5/5  5/5  Knee flexors  5/5   5/5   Knee extensors  5/5   5/5   Dorsiflexors  3/5   5/5   Plantarflexors  5/5   5/5   Toe extensors  3/5   5/5   Toe flexors  5/5   5/5   Tone (Ashworth scale)  0  0    MSRs:  Reflexes are 2+/4 throughout, except absent at the ankle bilaterally.  SENSORY:  Absent vibration at the right ankle, reduced pin prick, temperature and light touch over the right lateral lower leg and dorsum of the foot.   COORDINATION/GAIT:  Normal finger-to- nose-finger.  Intact rapid alternating movements bilaterally.  Gait is mildly wide-based, slow, unassisted, no steppage  Data: EMG bilateral lower extremities 09/14/2018 performed at [facility]: This is a technically limited study due to the patient's right lower leg edema and active participation on exam.  This is an abnormal study with electrodiagnostic evidence of the following 1.  Severe sensory and motor axonal demyelinating polyneuropathy with chronic denervation 2.  Possible superimposed right peroneal neuropathy versus technical issues related to testing.  The absent right peroneal motor and sensory responses may be secondary to lower extremity edema and inability to properly record potentials.   Component     Latest Ref Rng & Units 12/11/2018  Zinc     60 - 130 mcg/dL 70  Copper     70 - 175 mcg/dL 24 (L)  Folate     >5.9 ng/mL 19.4  Vitamin B1 (Thiamine)     8 - 30 nmol/L 6 (L)  Vitamin B12     211 - 911 pg/mL 297     IMPRESSION/PLAN: Right peroneal mononeuropathy, stable Peripheral neuropathy affecting the leg, worsening.  NCS/EMG declined. Multiple nutrient deficiency - vitamin B12, vitamin B1, and copper deficiency contributing to neuropathy and cognitive  complaints  - Check vitamin B12, folate, vitamin B1, copper, zinc  - Encouraged to eat nutritious meals Memory loss, most likely related to vitamin deficiency.   She scored extremely well on [place] 26/30.  She is very concerned about her symptoms and would like more testing  - MRI brain wo contrast  - Formal neuropsychological testing  Follow-up after testing  Total time spent reviewing records, interview, history/exam, documentation, and coordination of care on day of encounter:  45 min    Thank you for allowing me to participate in patient's care.  If I can answer any additional questions, I would be pleased to do so.    Sincerely,    Kimmora Risenhoover K. Posey Pronto, DO

## 2021-08-02 LAB — VITAMIN B1: Vitamin B1 (Thiamine): <6 nmol/L — ABNORMAL LOW (ref 8–30)

## 2021-08-02 LAB — ZINC: Zinc: 72 ug/dL (ref 60–130)

## 2021-08-02 LAB — COPPER, SERUM: Copper: 22 ug/dL — ABNORMAL LOW (ref 70–175)

## 2021-08-03 NOTE — Patient Instructions (Addendum)
DUE TO COVID-19 ONLY ONE VISITOR IS ALLOWED TO COME WITH YOU AND STAY IN THE WAITING ROOM ONLY DURING PRE OP AND PROCEDURE.   **NO VISITORS ARE ALLOWED IN THE SHORT STAY AREA OR RECOVERY ROOM!!**  IF YOU WILL BE ADMITTED INTO THE HOSPITAL YOU ARE ALLOWED ONLY TWO SUPPORT PEOPLE DURING VISITATION HOURS ONLY (10AM -8PM)   The support person(s) may change daily. The support person(s) must pass our screening, gel in and out, and wear a mask at all times, including in the patient's room. Patients must also wear a mask when staff or their support person are in the room.  No visitors under the age of 38. Any visitor under the age of 58 must be accompanied by an adult.     Your procedure is scheduled on: 08/10/21   Report to [facility] Main Entrance    Report to admitting at: 11:45 AM   Call this number if you have problems the morning of surgery [phone]   Do not eat food :After Midnight.   May have liquids until : 11:00 AM   day of surgery  CLEAR LIQUID DIET  Foods Allowed                                                                     Foods Excluded  Water, Black Coffee and tea, regular and decaf                             liquids that you cannot  Plain Jell-O in any flavor  (No red)                                           see through such as: Fruit ices (not with fruit pulp)                                     milk, soups, orange juice              Iced Popsicles (No red)                                    All solid food                                   Apple juices Sports drinks like Gatorade (No red) Lightly seasoned clear broth or consume(fat free) Sugar,  Sample Menu Breakfast                                Lunch                                     Supper Cranberry juice  Beef broth                            Chicken broth Jell-O                                     Grape juice                           Apple juice Coffee or tea                         Jell-O                                      Popsicle                                                Coffee or tea                        Coffee or tea     Oral Hygiene is also important to reduce your risk of infection.                                    Remember - BRUSH YOUR TEETH THE MORNING OF SURGERY WITH YOUR REGULAR TOOTHPASTE   Do NOT smoke after Midnight   Take these medicines the morning of surgery with A SIP OF WATER: topamax,lexapro,lithium,synthroid,nexium,rifaximin.Rizatriptan,xanax as needed.  DO NOT TAKE ANY ORAL DIABETIC MEDICATIONS DAY OF YOUR SURGERY                              You may not have any metal on your body including hair pins, jewelry, and body piercing             Do not wear make-up, lotions, powders, perfumes/cologne, or deodorant  Do not wear nail polish including gel and S&S, artificial/acrylic nails, or any other type of covering on natural nails including finger and toenails. If you have artificial nails, gel coating, etc. that needs to be removed by a nail salon please have this removed prior to surgery or surgery may need to be canceled/ delayed if the surgeon/ anesthesia feels like they are unable to be safely monitored.   Do not shave  48 hours prior to surgery.    Do not bring valuables to the hospital. [place].   Contacts, dentures or bridgework may not be worn into surgery.   Bring small overnight bag day of surgery.    Patients discharged on the day of surgery will not be allowed to drive home.   Special Instructions: Bring a copy of your healthcare power of attorney and living will documents         the day of surgery if you haven't scanned them before.  Please read over the following fact sheets you were given: IF YOU HAVE QUESTIONS ABOUT YOUR PRE-OP INSTRUCTIONS PLEASE CALL [phone]   [facility] - Preparing for Surgery Before surgery, you can play an  important role.  Because skin is not sterile, your skin needs to be as free of germs as possible.  You can reduce the number of germs on your skin by washing with CHG (chlorahexidine gluconate) soap before surgery.  CHG is an antiseptic cleaner which kills germs and bonds with the skin to continue killing germs even after washing. Please DO NOT use if you have an allergy to CHG or antibacterial soaps.  If your skin becomes reddened/irritated stop using the CHG and inform your nurse when you arrive at Short Stay. Do not shave (including legs and underarms) for at least 48 hours prior to the first CHG shower.  You may shave your face/neck. Please follow these instructions carefully:  1.  Shower with CHG Soap the night before surgery and the  morning of Surgery.  2.  If you choose to wash your hair, wash your hair first as usual with your  normal  shampoo.  3.  After you shampoo, rinse your hair and body thoroughly to remove the  shampoo.                           4.  Use CHG as you would any other liquid soap.  You can apply chg directly  to the skin and wash                       Gently with a scrungie or clean washcloth.  5.  Apply the CHG Soap to your body ONLY FROM THE NECK DOWN.   Do not use on face/ open                           Wound or open sores. Avoid contact with eyes, ears mouth and genitals (private parts).                       Wash face,  Genitals (private parts) with your normal soap.             6.  Wash thoroughly, paying special attention to the area where your surgery  will be performed.  7.  Thoroughly rinse your body with warm water from the neck down.  8.  DO NOT shower/wash with your normal soap after using and rinsing off  the CHG Soap.                9.  Pat yourself dry with a clean towel.            10.  Wear clean pajamas.            11.  Place clean sheets on your bed the night of your first shower and do not  sleep with pets. Day of Surgery : Do not apply any  lotions/deodorants the morning of surgery.  Please wear clean clothes to the hospital/surgery center.  FAILURE TO FOLLOW THESE INSTRUCTIONS MAY RESULT IN THE CANCELLATION OF YOUR SURGERY PATIENT SIGNATURE_________________________________  NURSE SIGNATURE__________________________________  ________________________________________________________________________

## 2021-08-04 ENCOUNTER — Other Ambulatory Visit: Payer: Self-pay

## 2021-08-04 ENCOUNTER — Encounter (HOSPITAL_COMMUNITY)
Admission: RE | Admit: 2021-08-04 | Discharge: 2021-08-04 | Disposition: A | Discharge status: Home / Self Care | Payer: BC Managed Care – PPO | Source: Ambulatory Visit | Attending: General Surgery | Admitting: General Surgery

## 2021-08-04 ENCOUNTER — Encounter (HOSPITAL_COMMUNITY): Payer: Self-pay

## 2021-08-04 DIAGNOSIS — Z01812 Encounter for preprocedural laboratory examination: Secondary | ICD-10-CM | POA: Insufficient documentation

## 2021-08-04 LAB — CBC
HCT: 44.7 % (ref 36.0–46.0)
Hemoglobin: 14.6 g/dL (ref 12.0–15.0)
MCH: 33.2 pg (ref 26.0–34.0)
MCHC: 32.7 g/dL (ref 30.0–36.0)
MCV: 101.6 fL — ABNORMAL HIGH (ref 80.0–100.0)
Platelets: 247 10*3/uL (ref 150–400)
RBC: 4.4 MIL/uL (ref 3.87–5.11)
RDW: 13.4 % (ref 11.5–15.5)
WBC: 5.5 10*3/uL (ref 4.0–10.5)
nRBC: 0 % (ref 0.0–0.2)

## 2021-08-04 LAB — BASIC METABOLIC PANEL
Anion gap: 8 (ref 5–15)
BUN: 14 mg/dL (ref 6–20)
CO2: 22 mmol/L (ref 22–32)
Calcium: 9.6 mg/dL (ref 8.9–10.3)
Chloride: 110 mmol/L (ref 98–111)
Creatinine, Ser: 0.9 mg/dL (ref 0.44–1.00)
GFR, Estimated: >60 mL/min (ref >60)
Glucose, Bld: 92 mg/dL (ref 70–99)
Potassium: 4.2 mmol/L (ref 3.5–5.1)
Sodium: 140 mmol/L (ref 135–145)

## 2021-08-04 NOTE — Progress Notes (Signed)
COVID Vaccine Completed: Yes Date COVID Vaccine completed: 01/20/21. X 4 COVID vaccine manufacturer: Pfizer     COVID Test: N/A. Positive home test results on: 07/16/21  PCP - Dr. Josetta Huddle Cardiologist - Dr. Jenne Campus. Clearance: Coletta Memos: PAC. 07/20/21: EPIC  Chest x-ray -  EKG - 07/25/21 Stress Test -  ECHO -  Cardiac Cath -  Pacemaker/ICD device last checked:  Sleep Study -  CPAP -   Fasting Blood Sugar -  Checks Blood Sugar _____ times a day  Blood Thinner Instructions: Aspirin Instructions: Last Dose:  Anesthesia review: Hx: Hypotension,Smoker  Patient denies shortness of breath, fever, cough and chest pain at PAT appointment   Patient verbalized understanding of instructions that were given to them at the PAT appointment. Patient was also instructed that they will need to review over the PAT instructions again at home before surgery.

## 2021-08-05 ENCOUNTER — Telehealth: Payer: Self-pay | Admitting: Neurology

## 2021-08-05 NOTE — Telephone Encounter (Signed)
Patient left a message stating she is returning a call for recent results.

## 2021-08-05 NOTE — Anesthesia Preprocedure Evaluation (Addendum)
Anesthesia Evaluation  Patient identified by MRN, date of birth, ID band Patient awake    Reviewed: Allergy & Precautions, NPO status , Patient's Chart, lab work & pertinent test results  History of Anesthesia Complications (+) PONV and history of anesthetic complications  Airway Mallampati: I  TM Distance: >3 FB Neck ROM: Full    Dental no notable dental hx.    Pulmonary asthma , Current Smoker and Patient abstained from smoking.,    Pulmonary exam normal        Cardiovascular negative cardio ROS Normal cardiovascular exam     Neuro/Psych  Headaches, Anxiety Depression Bipolar Disorder    GI/Hepatic hiatal hernia, GERD  ,(+) Cirrhosis       ,   Endo/Other  Hypothyroidism   Renal/GU Renal InsufficiencyRenal disease  negative genitourinary   Musculoskeletal  (+) Arthritis , Rheumatoid disorders,  Fibromyalgia -  Abdominal   Peds  Hematology negative hematology ROS (+)   Anesthesia Other Findings Day of surgery medications reviewed with patient.  Reproductive/Obstetrics negative OB ROS                           Anesthesia Physical Anesthesia Plan  ASA: 3  Anesthesia Plan: General   Post-op Pain Management:    Induction: Intravenous  PONV Risk Score and Plan: 4 or greater and Treatment may vary due to age or medical condition, Ondansetron, Dexamethasone, Midazolam, Scopolamine patch - Pre-op, Propofol infusion and Aprepitant  Airway Management Planned: Oral ETT  Additional Equipment: None  Intra-op Plan:   Post-operative Plan: Extubation in OR  Informed Consent: I have reviewed the patients History and Physical, chart, labs and discussed the procedure including the risks, benefits and alternatives for the proposed anesthesia with the patient or authorized representative who has indicated his/her understanding and acceptance.     Dental advisory given  Plan Discussed with:    Anesthesia Plan Comments: (See PAT note 08/04/2021, Konrad Felix Ward, PA-C)      Anesthesia Quick Evaluation

## 2021-08-05 NOTE — Progress Notes (Signed)
Anesthesia Chart Review   Case: [id number] Date/Time: 08/10/21 1345   Procedures:      XI ROBOTIC ASSISTED HIATAL HERNIA REPAIR WITH POSSIBLE MESH     UPPER GI ENDOSCOPY   Anesthesia type: General   Pre-op diagnosis: HIATAL HERNIA   Location: [place] / [facility]   Surgeons: Greer Pickerel, MD       DISCUSSION:50 y.o. every day smoker with h/o PONV, asthma, GERD, CKD, hiatal hernia scheduled for above procedure 08/10/2021 with Dr. Greer Pickerel.   Pt with idiopathic hypotension.  Per cardiology preoperative evaluation 07/20/2021, "Chart reviewed as part of pre-operative protocol coverage. Given past medical history and time since last visit, based on ACC/AHA guidelines, Jordan Russell would be at acceptable risk for the planned procedure without further cardiovascular testing.  Her RCRI is a class II risk, 0.9% risk of major cardiac event."  Case previously cancelled due to Jordan Russell.  Per ED note 07/25/2021 pt tested positive on home test 07/16/2021. No preop COVID testing indicated.  VS: BP 91/65   Pulse 64   Temp 36.8 C (Oral)   Ht 5\' 6"  (1.676 m)   Wt 77.6 kg   SpO2 95%   BMI 27.60 kg/m   PROVIDERS: Josetta Huddle, MD is PCP   Jenne Campus, MD is Cardiologist  LABS: Labs reviewed: Acceptable for surgery. (all labs ordered are listed, but only abnormal results are displayed)  Labs Reviewed  CBC - Abnormal; Notable for the following components:      Result Value   MCV 101.6 (*)    All other components within normal limits  BASIC METABOLIC PANEL     IMAGES:   EKG: 07/25/2021 Rate 58 bpm  Sinus rhythm  ST elev, probable normal early repol pattern  CV: Stress Test 07/08/2021   The study is normal. The study is low risk.   Left ventricular function is abnormal. Nuclear stress EF: 51 %. The left ventricular ejection fraction is mildly decreased (45-54%).   Prior study not available for comparison.   Past Medical History:  Diagnosis Date   Asthma    Bipolar 1  disorder ([place])    Chronic kidney disease    Depression    Fibromyalgia    GAD (generalized anxiety disorder)    Gastroparesis    GERD (gastroesophageal reflux disease)    Hiatal hernia    History of kidney stones    History of primary hyperparathyroidism    s/p  bilateral inferior parathyroidectomy 08-03-2010   Hypotension    Hypothyroidism    IBS (irritable bowel syndrome)    Left ureteral stone    Migraine    Pneumonia    PONV (postoperative nausea and vomiting)    and claustrophobic with mask   RA (rheumatoid arthritis) ([place])    dr Gavin Pound-- rheumtologist   Self mutilating behavior    Thyroid disease    Urgency of urination    Wears glasses     Past Surgical History:  Procedure Laterality Date   ABDOMINAL HYSTERECTOMY     BALLOON DILATION N/A 10/01/2014   Procedure: BALLOON DILATION;  Surgeon: Garlan Fair, MD;  Location: [facility];  Service: Endoscopy;  Laterality: N/A;   BIOPSY  03/05/2021   Procedure: BIOPSY;  Surgeon: Ronnette Juniper, MD;  Location: [facility];  Service: Gastroenterology;;   CYSTO/  BILATERAL RETROGRADE PYELOGRAM  11/20/2000   CYSTO/  RIGHT RETROGRADE PYELOGRAM/  RIGHT URETEROSCOPY/  STENT PLACEMENT  12/16/2006   CYSTOSCOPY/URETEROSCOPY/HOLMIUM LASER/STENT PLACEMENT Left 11/09/2016  Procedure: CYSTOSCOPY/URETEROSCOPY/HOLMIUM LASER/STENT PLACEMENT;  Surgeon: Nickie Retort, MD;  Location: [facility];  Service: Urology;  Laterality: Left;  90 MINS  [phone]    ESOPHAGEAL MANOMETRY N/A 12/23/2014   Procedure: ESOPHAGEAL MANOMETRY (EM);  Surgeon: Garlan Fair, MD;  Location: [facility];  Service: Endoscopy;  Laterality: N/A;   ESOPHAGOGASTRODUODENOSCOPY  last one 03-28-2015   ESOPHAGOGASTRODUODENOSCOPY (EGD) WITH PROPOFOL N/A 10/01/2014   Procedure: ESOPHAGOGASTRODUODENOSCOPY (EGD) WITH PROPOFOL;  Surgeon: Garlan Fair, MD;  Location: [facility];  Service: Endoscopy;  Laterality: N/A;    ESOPHAGOGASTRODUODENOSCOPY (EGD) WITH PROPOFOL N/A 03/05/2021   Procedure: ESOPHAGOGASTRODUODENOSCOPY (EGD) WITH PROPOFOL;  Surgeon: Ronnette Juniper, MD;  Location: [facility];  Service: Gastroenterology;  Laterality: N/A;   EXTRACORPOREAL SHOCK WAVE LITHOTRIPSY  multiple times since age 1   HOLMIUM LASER APPLICATION Left 03/07/8468   Procedure: HOLMIUM LASER APPLICATION;  Surgeon: Nickie Retort, MD;  Location: [facility];  Service: Urology;  Laterality: Left;   KNEE ARTHROSCOPY Right 03-04-2014  [facility]   w/ Arthrotomy   LAPAROSCOPIC ASSISTED VAGINAL HYSTERECTOMY  12/28/2005   LAPAROSCOPIC CHOLECYSTECTOMY  01/17/2004   LAPAROSCOPY LEFT OVARIAN CYSTECTOMY/  BILATERAL TUBAL LIGATION  02/26/2003   LEFT URETEROSCOPIC STONE EXTRACTION /  STENT PLACEMENT  07/09/2002   NECK EXPLORATION/  BILATERAL INFERIOR PARATHYROIDECTOMY  08/03/2010   RIGHT URETERAL DILATION/  URETEROSCOPIC STONE EXTRACTION  06/12/2010   ROUX-EN-Y GASTRIC BYPASS  2001   SOLYX TRANSURETHRAL SLING/  POSTERIOR PELVIC FLOOR SACROSPINOUS REPAIR  02/21/2009   and Cysto/  Bilateral ureteral stent placement   TONSILLECTOMY AND ADENOIDECTOMY     WRIST GANGLION EXCISION Right 01/28/2000    MEDICATIONS:  ALPRAZolam (XANAX) 0.5 MG tablet   Carboxymeth-Glyc-Polysorb PF (REFRESH OPTIVE MEGA-3) 0.5-1-0.5 % SOLN   dicyclomine (BENTYL) 20 MG tablet   escitalopram (LEXAPRO) 20 MG tablet   esomeprazole (NEXIUM) 40 MG capsule   folic acid (FOLVITE) 1 MG tablet   furosemide (LASIX) 20 MG tablet   ibuprofen (ADVIL) 200 MG tablet   lactulose (CHRONULAC) 10 GM/15ML solution   levothyroxine (SYNTHROID) 88 MCG tablet   LINZESS 290 MCG CAPS capsule   lithium 300 MG tablet   ondansetron (ZOFRAN-ODT) 8 MG disintegrating tablet   rifaximin (XIFAXAN) 550 MG TABS tablet   rizatriptan (MAXALT) 10 MG tablet   spironolactone (ALDACTONE) 50 MG tablet   topiramate (TOPAMAX) 25 MG tablet   traZODone (DESYREL) 100 MG tablet   No current  facility-administered medications for this encounter.    Konrad Felix Ward, PA-C [facility] [phone]

## 2021-08-06 NOTE — Telephone Encounter (Signed)
Called patient and informed her of labs per Dr. Posey Pronto: "Please inform pt that her vitamins are low.  She needs to start vitamin B12 7824MPN daily, folic acid 1mg , vitamin B1 100mg , and copper 2mg  daily."  Patient stated that she already takes a Folic Acid 1 mg and would like to know if she should take more? Informed patient that I will send a message to Dr. Posey Pronto and once I hear back patient would like me to send a Mychart message to her with above result and recommendation information.

## 2021-08-06 NOTE — Telephone Encounter (Signed)
Patient has been notifies via [employer] of recommendations.

## 2021-08-06 NOTE — Telephone Encounter (Signed)
Please tell her to increase to folic acid 2mg  for one month, then go back down to 1mg  daily. Thanks.

## 2021-08-10 ENCOUNTER — Ambulatory Visit: Payer: Self-pay | Admitting: General Surgery

## 2021-08-10 ENCOUNTER — Observation Stay (HOSPITAL_COMMUNITY)
Admission: RE | Admit: 2021-08-10 | Discharge: 2021-08-11 | Disposition: A | Discharge status: Home / Self Care | Payer: BC Managed Care – PPO | Attending: General Surgery | Admitting: General Surgery

## 2021-08-10 ENCOUNTER — Ambulatory Visit (HOSPITAL_COMMUNITY): Payer: BC Managed Care – PPO | Admitting: Anesthesiology

## 2021-08-10 ENCOUNTER — Encounter (HOSPITAL_COMMUNITY): Payer: Self-pay | Admitting: General Surgery

## 2021-08-10 ENCOUNTER — Encounter (HOSPITAL_COMMUNITY)
Admission: RE | Disposition: A | Discharge status: Home / Self Care | Payer: Self-pay | Source: Home / Self Care | Attending: General Surgery

## 2021-08-10 ENCOUNTER — Ambulatory Visit (HOSPITAL_COMMUNITY): Payer: BC Managed Care – PPO | Admitting: Physician Assistant

## 2021-08-10 DIAGNOSIS — Z8616 Personal history of COVID-19: Secondary | ICD-10-CM | POA: Insufficient documentation

## 2021-08-10 DIAGNOSIS — Z23 Encounter for immunization: Secondary | ICD-10-CM | POA: Insufficient documentation

## 2021-08-10 DIAGNOSIS — K660 Peritoneal adhesions (postprocedural) (postinfection): Secondary | ICD-10-CM | POA: Diagnosis not present

## 2021-08-10 DIAGNOSIS — F17210 Nicotine dependence, cigarettes, uncomplicated: Secondary | ICD-10-CM | POA: Diagnosis not present

## 2021-08-10 DIAGNOSIS — N189 Chronic kidney disease, unspecified: Secondary | ICD-10-CM | POA: Diagnosis not present

## 2021-08-10 DIAGNOSIS — E039 Hypothyroidism, unspecified: Secondary | ICD-10-CM | POA: Diagnosis not present

## 2021-08-10 DIAGNOSIS — E559 Vitamin D deficiency, unspecified: Secondary | ICD-10-CM | POA: Diagnosis not present

## 2021-08-10 DIAGNOSIS — E519 Thiamine deficiency, unspecified: Secondary | ICD-10-CM

## 2021-08-10 DIAGNOSIS — Z8719 Personal history of other diseases of the digestive system: Secondary | ICD-10-CM

## 2021-08-10 DIAGNOSIS — K449 Diaphragmatic hernia without obstruction or gangrene: Principal | ICD-10-CM | POA: Insufficient documentation

## 2021-08-10 DIAGNOSIS — Z9884 Bariatric surgery status: Secondary | ICD-10-CM | POA: Diagnosis not present

## 2021-08-10 DIAGNOSIS — J45909 Unspecified asthma, uncomplicated: Secondary | ICD-10-CM | POA: Insufficient documentation

## 2021-08-10 HISTORY — PX: UPPER GI ENDOSCOPY: SHX6162

## 2021-08-10 HISTORY — PX: XI ROBOTIC ASSISTED HIATAL HERNIA REPAIR: SHX6889

## 2021-08-10 SURGERY — REPAIR, HERNIA, HIATAL, ROBOT-ASSISTED
Anesthesia: General | Site: Abdomen

## 2021-08-10 MED ORDER — SIMETHICONE 80 MG PO CHEW: 40.0000 mg | CHEWABLE_TABLET | Freq: Four times a day (QID) | ORAL | Status: DC | PRN

## 2021-08-10 MED ORDER — DIPHENHYDRAMINE HCL 12.5 MG/5ML PO ELIX
12.5000 mg | ORAL_SOLUTION | Freq: Four times a day (QID) | ORAL | Status: DC | PRN
  Administered 2021-08-11: 12.5 mg via ORAL
  Filled 2021-08-10: qty 5

## 2021-08-10 MED ORDER — SODIUM CHLORIDE (PF) 0.9 % IJ SOLN
INTRAMUSCULAR | Status: AC
  Filled 2021-08-10: qty 50

## 2021-08-10 MED ORDER — LACTATED RINGERS IV SOLN: INTRAVENOUS | Status: DC

## 2021-08-10 MED ORDER — OXYCODONE HCL 5 MG PO TABS: 5.0000 mg | ORAL_TABLET | Freq: Once | ORAL | Status: DC | PRN

## 2021-08-10 MED ORDER — LEVOTHYROXINE SODIUM 88 MCG PO TABS
88.0000 ug | ORAL_TABLET | Freq: Every day | ORAL | Status: DC
  Administered 2021-08-11: 88 ug via ORAL
  Filled 2021-08-10: qty 1

## 2021-08-10 MED ORDER — CHLORHEXIDINE GLUCONATE 0.12 % MT SOLN
15.0000 mL | Freq: Once | OROMUCOSAL | Status: DC
  Administered 2021-08-10: 15 mL via OROMUCOSAL

## 2021-08-10 MED ORDER — PROPOFOL 500 MG/50ML IV EMUL: INTRAVENOUS | Status: DC | PRN

## 2021-08-10 MED ORDER — SODIUM CHLORIDE (PF) 0.9 % IJ SOLN
INTRAMUSCULAR | Status: DC | PRN
  Administered 2021-08-10: 50 mL

## 2021-08-10 MED ORDER — SCOPOLAMINE 1 MG/3DAYS TD PT72
1.0000 | MEDICATED_PATCH | Freq: Once | TRANSDERMAL | Status: AC
  Administered 2021-08-10: 1 via TRANSDERMAL
  Filled 2021-08-10: qty 1

## 2021-08-10 MED ORDER — FENTANYL CITRATE PF 50 MCG/ML IJ SOSY: 25.0000 ug | PREFILLED_SYRINGE | INTRAMUSCULAR | Status: DC | PRN

## 2021-08-10 MED ORDER — ROCURONIUM BROMIDE 10 MG/ML (PF) SYRINGE
PREFILLED_SYRINGE | INTRAVENOUS | Status: DC | PRN
  Administered 2021-08-10 (×2): 20 mg via INTRAVENOUS
  Administered 2021-08-10: 50 mg via INTRAVENOUS

## 2021-08-10 MED ORDER — TOPIRAMATE 25 MG PO TABS
25.0000 mg | ORAL_TABLET | Freq: Two times a day (BID) | ORAL | Status: DC
  Administered 2021-08-10: 25 mg via ORAL
  Filled 2021-08-10 (×3): qty 1

## 2021-08-10 MED ORDER — ENOXAPARIN SODIUM 40 MG/0.4ML IJ SOSY
40.0000 mg | PREFILLED_SYRINGE | INTRAMUSCULAR | Status: DC
  Administered 2021-08-11: 40 mg via SUBCUTANEOUS
  Filled 2021-08-10: qty 0.4

## 2021-08-10 MED ORDER — THIAMINE HCL 100 MG/ML IJ SOLN
100.0000 mg | Freq: Once | INTRAMUSCULAR | Status: AC
  Administered 2021-08-10: 100 mg via INTRAVENOUS
  Filled 2021-08-10: qty 1

## 2021-08-10 MED ORDER — SUMATRIPTAN SUCCINATE 50 MG PO TABS
50.0000 mg | ORAL_TABLET | ORAL | Status: DC | PRN
  Filled 2021-08-10: qty 1

## 2021-08-10 MED ORDER — PROPOFOL 10 MG/ML IV BOLUS
INTRAVENOUS | Status: AC
  Filled 2021-08-10: qty 20

## 2021-08-10 MED ORDER — ORAL CARE MOUTH RINSE: 15.0000 mL | Freq: Once | OROMUCOSAL | Status: DC

## 2021-08-10 MED ORDER — 0.9 % SODIUM CHLORIDE (POUR BTL) OPTIME
TOPICAL | Status: DC | PRN
  Administered 2021-08-10: 1000 mL

## 2021-08-10 MED ORDER — OXYCODONE HCL 5 MG/5ML PO SOLN
5.0000 mg | Freq: Once | ORAL | Status: DC | PRN
Start: 2021-08-10 — End: 2021-08-10

## 2021-08-10 MED ORDER — OXYCODONE HCL 5 MG PO TABS
5.0000 mg | ORAL_TABLET | ORAL | Status: DC | PRN
  Administered 2021-08-10 – 2021-08-11 (×3): 10 mg via ORAL
  Filled 2021-08-10 (×3): qty 2

## 2021-08-10 MED ORDER — ONDANSETRON HCL 4 MG/2ML IJ SOLN
INTRAMUSCULAR | Status: DC | PRN
  Administered 2021-08-10: 4 mg via INTRAVENOUS

## 2021-08-10 MED ORDER — FENTANYL CITRATE (PF) 100 MCG/2ML IJ SOLN
INTRAMUSCULAR | Status: DC | PRN
  Administered 2021-08-10 (×2): 50 ug via INTRAVENOUS
  Administered 2021-08-10: 100 ug via INTRAVENOUS
  Administered 2021-08-10: 50 ug via INTRAVENOUS

## 2021-08-10 MED ORDER — LIDOCAINE HCL (CARDIAC) PF 100 MG/5ML IV SOSY
PREFILLED_SYRINGE | INTRAVENOUS | Status: DC | PRN
  Administered 2021-08-10: 60 mg via INTRAVENOUS

## 2021-08-10 MED ORDER — ESMOLOL HCL 100 MG/10ML IV SOLN
INTRAVENOUS | Status: DC | PRN
  Administered 2021-08-10: 20 mg via INTRAVENOUS

## 2021-08-10 MED ORDER — APREPITANT 40 MG PO CAPS: 40.0000 mg | ORAL_CAPSULE | Freq: Once | ORAL | Status: DC

## 2021-08-10 MED ORDER — ACETAMINOPHEN 500 MG PO TABS
1000.0000 mg | ORAL_TABLET | Freq: Once | ORAL | Status: AC
  Administered 2021-08-10: 1000 mg via ORAL
  Filled 2021-08-10: qty 2

## 2021-08-10 MED ORDER — ACETAMINOPHEN 500 MG PO TABS
1000.0000 mg | ORAL_TABLET | Freq: Three times a day (TID) | ORAL | Status: DC
  Administered 2021-08-10 – 2021-08-11 (×3): 1000 mg via ORAL
  Filled 2021-08-10 (×3): qty 2

## 2021-08-10 MED ORDER — BUPIVACAINE LIPOSOME 1.3 % IJ SUSP
INTRAMUSCULAR | Status: DC | PRN
  Administered 2021-08-10: 20 mL

## 2021-08-10 MED ORDER — ONDANSETRON 4 MG PO TBDP: 4.0000 mg | ORAL_TABLET | Freq: Four times a day (QID) | ORAL | Status: DC | PRN

## 2021-08-10 MED ORDER — MIDAZOLAM HCL 2 MG/2ML IJ SOLN
INTRAMUSCULAR | Status: AC
  Filled 2021-08-10: qty 2

## 2021-08-10 MED ORDER — LACTATED RINGERS IR SOLN
Status: DC | PRN
  Administered 2021-08-10: 1000 mL

## 2021-08-10 MED ORDER — ONDANSETRON HCL 4 MG/2ML IJ SOLN
4.0000 mg | Freq: Four times a day (QID) | INTRAMUSCULAR | Status: DC | PRN
  Administered 2021-08-10: 4 mg via INTRAVENOUS
  Filled 2021-08-10: qty 2

## 2021-08-10 MED ORDER — DICYCLOMINE HCL 20 MG PO TABS
20.0000 mg | ORAL_TABLET | Freq: Three times a day (TID) | ORAL | Status: DC | PRN
  Filled 2021-08-10: qty 1

## 2021-08-10 MED ORDER — LITHIUM CARBONATE 300 MG PO CAPS
300.0000 mg | ORAL_CAPSULE | Freq: Two times a day (BID) | ORAL | Status: DC
  Administered 2021-08-10: 300 mg via ORAL
  Filled 2021-08-10 (×3): qty 1

## 2021-08-10 MED ORDER — DEXMEDETOMIDINE (PRECEDEX) IN NS 20 MCG/5ML (4 MCG/ML) IV SYRINGE
PREFILLED_SYRINGE | INTRAVENOUS | Status: DC | PRN
  Administered 2021-08-10: 8 ug via INTRAVENOUS

## 2021-08-10 MED ORDER — FENTANYL CITRATE (PF) 250 MCG/5ML IJ SOLN
INTRAMUSCULAR | Status: AC
  Filled 2021-08-10: qty 5

## 2021-08-10 MED ORDER — HEPARIN SODIUM (PORCINE) 5000 UNIT/ML IJ SOLN
5000.0000 [IU] | INTRAMUSCULAR | Status: AC
  Administered 2021-08-10: 5000 [IU] via SUBCUTANEOUS
  Filled 2021-08-10: qty 1

## 2021-08-10 MED ORDER — VANCOMYCIN HCL IN DEXTROSE 1-5 GM/200ML-% IV SOLN: 1000.0000 mg | Freq: Once | INTRAVENOUS | Status: AC

## 2021-08-10 MED ORDER — CEFAZOLIN SODIUM-DEXTROSE 2-4 GM/100ML-% IV SOLN
INTRAVENOUS | Status: AC
  Filled 2021-08-10: qty 100

## 2021-08-10 MED ORDER — SUGAMMADEX SODIUM 200 MG/2ML IV SOLN
INTRAVENOUS | Status: DC | PRN
  Administered 2021-08-10: 160 mg via INTRAVENOUS

## 2021-08-10 MED ORDER — EPHEDRINE SULFATE-NACL 50-0.9 MG/10ML-% IV SOSY
PREFILLED_SYRINGE | INTRAVENOUS | Status: DC | PRN
  Administered 2021-08-10 (×2): 10 mg via INTRAVENOUS

## 2021-08-10 MED ORDER — BUPIVACAINE LIPOSOME 1.3 % IJ SUSP
INTRAMUSCULAR | Status: AC
  Filled 2021-08-10: qty 20

## 2021-08-10 MED ORDER — KCL IN DEXTROSE-NACL 20-5-0.45 MEQ/L-%-% IV SOLN
INTRAVENOUS | Status: DC
  Filled 2021-08-10: qty 1000

## 2021-08-10 MED ORDER — SPIRONOLACTONE 25 MG PO TABS
50.0000 mg | ORAL_TABLET | Freq: Every day | ORAL | Status: DC
  Filled 2021-08-10: qty 2

## 2021-08-10 MED ORDER — PROPOFOL 500 MG/50ML IV EMUL
INTRAVENOUS | Status: DC | PRN
Start: 2021-08-10 — End: 2021-08-10
  Administered 2021-08-10: 200 ug/kg/min via INTRAVENOUS

## 2021-08-10 MED ORDER — INFLUENZA VAC SPLIT QUAD 0.5 ML IM SUSY
0.5000 mL | PREFILLED_SYRINGE | INTRAMUSCULAR | Status: AC
  Administered 2021-08-11: 0.5 mL via INTRAMUSCULAR
  Filled 2021-08-10: qty 0.5

## 2021-08-10 MED ORDER — MIDAZOLAM HCL 5 MG/5ML IJ SOLN
INTRAMUSCULAR | Status: DC | PRN
  Administered 2021-08-10: 2 mg via INTRAVENOUS

## 2021-08-10 MED ORDER — DEXAMETHASONE SODIUM PHOSPHATE 4 MG/ML IJ SOLN
INTRAMUSCULAR | Status: DC | PRN
  Administered 2021-08-10: 1 mg via INTRAVENOUS

## 2021-08-10 MED ORDER — DIPHENHYDRAMINE HCL 50 MG/ML IJ SOLN: 12.5000 mg | Freq: Four times a day (QID) | INTRAMUSCULAR | Status: DC | PRN

## 2021-08-10 MED ORDER — VITAMIN B-1 50 MG PO TABS
250.0000 mg | ORAL_TABLET | Freq: Every day | ORAL | Status: DC
  Administered 2021-08-11: 250 mg via ORAL
  Filled 2021-08-10: qty 1

## 2021-08-10 MED ORDER — CHLORHEXIDINE GLUCONATE 0.12 % MT SOLN: 15.0000 mL | Freq: Once | OROMUCOSAL | Status: DC

## 2021-08-10 MED ORDER — POLYVINYL ALCOHOL 1.4 % OP SOLN
1.0000 [drp] | Freq: Three times a day (TID) | OPHTHALMIC | Status: DC | PRN
  Filled 2021-08-10: qty 15

## 2021-08-10 MED ORDER — VANCOMYCIN HCL IN DEXTROSE 1-5 GM/200ML-% IV SOLN
INTRAVENOUS | Status: AC
  Administered 2021-08-10: 1000 mg via INTRAVENOUS
  Filled 2021-08-10: qty 200

## 2021-08-10 MED ORDER — TRAZODONE HCL 100 MG PO TABS
200.0000 mg | ORAL_TABLET | Freq: Every day | ORAL | Status: DC
  Administered 2021-08-10: 200 mg via ORAL
  Filled 2021-08-10: qty 2

## 2021-08-10 MED ORDER — SODIUM CHLORIDE 0.9 % IV SOLN
12.5000 mg | Freq: Four times a day (QID) | INTRAVENOUS | Status: DC | PRN
  Filled 2021-08-10: qty 0.5

## 2021-08-10 MED ORDER — THIAMINE HCL 100 MG/ML IJ SOLN
100.0000 mg | Freq: Every day | INTRAMUSCULAR | Status: AC
Start: 2021-08-10 — End: ?

## 2021-08-10 MED ORDER — PANTOPRAZOLE SODIUM 40 MG IV SOLR
40.0000 mg | Freq: Every day | INTRAVENOUS | Status: DC
  Administered 2021-08-10: 40 mg via INTRAVENOUS
  Filled 2021-08-10: qty 40

## 2021-08-10 MED ORDER — PROPOFOL 10 MG/ML IV BOLUS
INTRAVENOUS | Status: DC | PRN
  Administered 2021-08-10: 200 mg via INTRAVENOUS

## 2021-08-10 MED ORDER — AMISULPRIDE (ANTIEMETIC) 5 MG/2ML IV SOLN: 10.0000 mg | Freq: Once | INTRAVENOUS | Status: DC | PRN

## 2021-08-10 MED ORDER — ESCITALOPRAM OXALATE 20 MG PO TABS
20.0000 mg | ORAL_TABLET | Freq: Every day | ORAL | Status: DC
  Administered 2021-08-11: 20 mg via ORAL
  Filled 2021-08-10: qty 1

## 2021-08-10 SURGICAL SUPPLY — 66 items
ADH SKN CLS APL DERMABOND .7 (GAUZE/BANDAGES/DRESSINGS)
APL PRP STRL LF DISP 70% ISPRP (MISCELLANEOUS) ×2
APPLIER CLIP 5 13 M/L LIGAMAX5 (MISCELLANEOUS)
APPLIER CLIP ROT 10 11.4 M/L (STAPLE)
APR CLP MED LRG 11.4X10 (STAPLE)
APR CLP MED LRG 5 ANG JAW (MISCELLANEOUS)
BLADE SURG SZ11 CARB STEEL (BLADE) ×3 IMPLANT
CHLORAPREP W/TINT 26 (MISCELLANEOUS) ×3 IMPLANT
CLIP APPLIE 5 13 M/L LIGAMAX5 (MISCELLANEOUS) IMPLANT
CLIP APPLIE ROT 10 11.4 M/L (STAPLE) IMPLANT
CLSR STERI-STRIP ANTIMIC 1/2X4 (GAUZE/BANDAGES/DRESSINGS) ×3 IMPLANT
COVER SURGICAL LIGHT HANDLE (MISCELLANEOUS) ×3 IMPLANT
COVER TIP SHEARS 8 DVNC (MISCELLANEOUS) ×2 IMPLANT
COVER TIP SHEARS 8MM DA VINCI (MISCELLANEOUS) ×3
DECANTER SPIKE VIAL GLASS SM (MISCELLANEOUS) ×3 IMPLANT
DERMABOND ADVANCED (GAUZE/BANDAGES/DRESSINGS)
DERMABOND ADVANCED .7 DNX12 (GAUZE/BANDAGES/DRESSINGS) IMPLANT
DRAIN PENROSE 0.5X18 (DRAIN) ×3 IMPLANT
DRAPE ARM DVNC X/XI (DISPOSABLE) ×8 IMPLANT
DRAPE COLUMN DVNC XI (DISPOSABLE) ×2 IMPLANT
DRAPE DA VINCI XI ARM (DISPOSABLE) ×12
DRAPE DA VINCI XI COLUMN (DISPOSABLE) ×3
DRSG TEGADERM 2-3/8X2-3/4 SM (GAUZE/BANDAGES/DRESSINGS) ×18 IMPLANT
ELECT REM PT RETURN 15FT ADLT (MISCELLANEOUS) ×3 IMPLANT
GAUZE 4X4 16PLY ~~LOC~~+RFID DBL (SPONGE) ×3 IMPLANT
GAUZE SPONGE 2X2 8PLY STRL LF (GAUZE/BANDAGES/DRESSINGS) ×2 IMPLANT
GLOVE SRG 8 PF TXTR STRL LF DI (GLOVE) ×2 IMPLANT
GLOVE SURG ENC TEXT LTX SZ8 (GLOVE) ×9 IMPLANT
GLOVE SURG UNDER POLY LF SZ8 (GLOVE) ×3
GOWN STRL REUS W/TWL LRG LVL3 (GOWN DISPOSABLE) ×9 IMPLANT
GOWN STRL REUS W/TWL XL LVL3 (GOWN DISPOSABLE) IMPLANT
IRRIG SUCT STRYKERFLOW 2 WTIP (MISCELLANEOUS) ×3
IRRIGATION SUCT STRKRFLW 2 WTP (MISCELLANEOUS) ×2 IMPLANT
KIT BASIN OR (CUSTOM PROCEDURE TRAY) ×3 IMPLANT
KIT TURNOVER KIT A (KITS) ×3 IMPLANT
LUBRICANT JELLY K Y 4OZ (MISCELLANEOUS) IMPLANT
MARKER SKIN DUAL TIP RULER LAB (MISCELLANEOUS) ×3 IMPLANT
MESH BIO-A 7X10 SYN MAT (Mesh General) ×3 IMPLANT
NEEDLE HYPO 22GX1.5 SAFETY (NEEDLE) ×3 IMPLANT
PACK CARDIOVASCULAR III (CUSTOM PROCEDURE TRAY) ×3 IMPLANT
PAD POSITIONING PINK XL (MISCELLANEOUS) IMPLANT
SCISSORS LAP 5X35 DISP (ENDOMECHANICALS) IMPLANT
SEAL CANN UNIV 5-8 DVNC XI (MISCELLANEOUS) ×8 IMPLANT
SEAL XI 5MM-8MM UNIVERSAL (MISCELLANEOUS) ×12
SEALER VESSEL DA VINCI XI (MISCELLANEOUS) ×3
SEALER VESSEL EXT DVNC XI (MISCELLANEOUS) ×2 IMPLANT
SOL ANTI FOG 6CC (MISCELLANEOUS) ×2 IMPLANT
SOLUTION ANTI FOG 6CC (MISCELLANEOUS) ×1
SOLUTION ELECTROLUBE (MISCELLANEOUS) ×3 IMPLANT
SPONGE GAUZE 2X2 STER 10/PKG (GAUZE/BANDAGES/DRESSINGS) ×1
SUT ETHIBOND 0 36 GRN (SUTURE) ×6 IMPLANT
SUT MNCRL AB 4-0 PS2 18 (SUTURE) ×3 IMPLANT
SUT SILK 0 SH 30 (SUTURE) IMPLANT
SUT SILK 2 0 SH (SUTURE) IMPLANT
SYR 10ML ECCENTRIC (SYRINGE) ×3 IMPLANT
SYR 20ML LL LF (SYRINGE) ×3 IMPLANT
TIP INNERVISION DETACH 40FR (MISCELLANEOUS) IMPLANT
TIP INNERVISION DETACH 50FR (MISCELLANEOUS) IMPLANT
TIP INNERVISION DETACH 56FR (MISCELLANEOUS) IMPLANT
TIPS INNERVISION DETACH 40FR (MISCELLANEOUS)
TOWEL OR 17X26 10 PK STRL BLUE (TOWEL DISPOSABLE) ×3 IMPLANT
TRAY FOLEY MTR SLVR 16FR STAT (SET/KITS/TRAYS/PACK) IMPLANT
TROCAR ADV FIXATION 5X100MM (TROCAR) IMPLANT
TROCAR BLADELESS OPT 5 100 (ENDOMECHANICALS) ×3 IMPLANT
TROCAR XCEL 12X100 BLDLESS (ENDOMECHANICALS) ×3 IMPLANT
TUBING INSUFFLATION 10FT LAP (TUBING) ×3 IMPLANT

## 2021-08-10 NOTE — Anesthesia Procedure Notes (Signed)
Procedure Name: Intubation Date/Time: 08/10/2021 1:08 PM Performed by: Eulas Post, Markcus Lazenby W, CRNA Pre-anesthesia Checklist: Patient identified, Emergency Drugs available, Suction available and Patient being monitored Patient Re-evaluated:Patient Re-evaluated prior to induction Oxygen Delivery Method: Circle system utilized Preoxygenation: Pre-oxygenation with 100% oxygen Induction Type: IV induction Ventilation: Mask ventilation without difficulty Laryngoscope Size: Miller and 2 Grade View: Grade I Tube type: Oral Tube size: 7.0 mm Number of attempts: 1 Airway Equipment and Method: Stylet Placement Confirmation: ETT inserted through vocal cords under direct vision, positive ETCO2 and breath sounds checked- equal and bilateral Secured at: 22 cm Tube secured with: Tape Dental Injury: Teeth and Oropharynx as per pre-operative assessment

## 2021-08-10 NOTE — H&P (Signed)
CC: here for surgery  Requesting provider: Dr Therisa Doyne  HPI: Jordan Russell is an 50 y.o. female who is here for robotic repair of hiatal hernia with possible mesh and possible upper endoscopy.  The patient has a remote history of laparoscopic Roux-en-Y gastric bypass in the early 2000's.  It appears that she has a hiatal hernia with all of her gastric pouch in her mediastinum which is causing issues with oral intake.  She reports that she is lost approximately 200 pounds since 2019.  She has undergone manometry, upper GI, and upper endoscopy as part of evaluation for this procedure.  The patient continues to smoke even though she has been extensively counseled regarding the risk of marginal ulcer as well as the risk of recurrent hernia.  She was recently diagnosed to have an ongoing low thiamine level.  Her surgery was previously canceled from a month or 2 ago due to being positive for COVID.  She received cardiac clearance for today's procedure.  She states that she is recovered from her COVID.  No pulmonary symptoms.  She continues to have foregut issues with trouble with eating and drinking.  She states that she has been taking supplemental B1, B12 and copper due to recent low level seen on labs.  Otherwise she denies any changes  Office note 04/2021:  The patient is a 50 year old female who presents with gastroesophageal reflux disease. she is referred by dr Therisa Doyne for difficulty eating and the hiatal hernia.  The patient underwent laparoscopic Roux-en-Y gastric bypass in [place] in 2002.  She states that she hasn't really been able to eat normally since 2019 after suffering liver failure which required a significant hospitalization at [facility].  She states that she was near-death.  Since then she reports that it feels like a ball is sitting in her lower chest.  She states that generally food comes immediately back up.  She cannot tolerate solid foods or thick foods or even thick  liquids.  Thin liquids will go down.  She is subsiding on 2 teaspoons of hummus a day.  She states that overall she has lost about 200 pounds since 2019.  She states that she regained most of her weight back over time after Roux-en-Y gastric bypass but loss 200 pounces 2019.  She also has IBS.  She has episodes of lower abdominal pain with diarrhea.  She also states that she has gastric paresis.  She denies dumping.  She has had cholecystectomy and pelvic floor repair, and hysterectomy.  She also reports a hard time laying flat at night because she will have a sensation of something coming back up.  She takes medicines for migraines.  She has bipolar one disorder.  She is on medications for liver function.  She occasionally takes an NSAID.  She had a manometry in 2016 which revealed normal upper esophageal sphincter basal pressure with normal relaxation, low basal LES pressure with normal relaxation.  We, ineffective peristaltic esophageal body contractions.  She has had an upper endoscopy which I reviewed as well: The upper third of the esophagus, middle third of the esophagus and lower third of the esophagus were normal. Biopsies were obtained from the proximal and distal esophagus with cold forceps for histology of suspected eosinophilic esophagitis. Findings: The Z-line was regular and was found 30 cm from the incisors. A 5 cm hiatal hernia was present. Evidence of a gastric bypass was found. A gastric pouch with a 5 cm length from the  mouth of hiatal hernia to the gastrojejunal anastomosis was found(total 10 cm length of gastric pouch, including hiatal hernia). The staple line appeared intact. The gastrojejunal anastomosis was characterized by healthy appearing mucosa. This was traversed. The pouch-to-jejunum limb was characterized by healthy appearing mucosa. The cardia and gastric fundus were normal on retroflexion. The examined jejunum was normal.  UGI 4/022 FINDINGS: Normal oral and  pharyngeal phases of swallowing, with no laryngeal penetration or tracheobronchial aspiration. No significant barium retention in the pharynx. No evidence of pharyngeal mass, stricture or diverticulum. No cricopharyngeus muscle dysfunction.  Status post Roux-en-Y gastric bypass surgery. Moderate hiatal hernia involving the gastric pouch. Normal esophageal motility. Severe spontaneous gastroesophageal reflux. Normal esophageal distensibility, with no evidence of esophageal mass, stricture or ulcer. No gross evidence of leak, ulcer or stricture at the gastrojejunal anastomosis. The swallowed 13 mm barium tablet traversed the esophagus into the stomach without delay.  IMPRESSION: 1. Status post Roux-en-Y gastric bypass surgery. Moderate hiatal hernia involving the gastric pouch. Severe spontaneous gastroesophageal reflux. 2. No gross evidence of leak, ulcer or stricture at the gastrojejunal anastomosis. 3. Normal esophageal motility. No evidence of esophageal mass or stricture. 4. Normal oral and pharyngeal phases of swallowing.  Past Medical History:  Diagnosis Date   Asthma    Bipolar 1 disorder ([place])    Chronic kidney disease    Depression    Fibromyalgia    GAD (generalized anxiety disorder)    Gastroparesis    GERD (gastroesophageal reflux disease)    Hiatal hernia    History of kidney stones    History of primary hyperparathyroidism    s/p  bilateral inferior parathyroidectomy 08-03-2010   Hypotension    Hypothyroidism    IBS (irritable bowel syndrome)    Left ureteral stone    Migraine    Pneumonia    PONV (postoperative nausea and vomiting)    and claustrophobic with mask   RA (rheumatoid arthritis) ([place])    dr Gavin Pound-- rheumtologist   Self mutilating behavior    Thyroid disease    Urgency of urination    Wears glasses     Past Surgical History:  Procedure Laterality Date   ABDOMINAL HYSTERECTOMY     BALLOON DILATION N/A 10/01/2014   Procedure:  BALLOON DILATION;  Surgeon: Garlan Fair, MD;  Location: [facility];  Service: Endoscopy;  Laterality: N/A;   BIOPSY  03/05/2021   Procedure: BIOPSY;  Surgeon: Ronnette Juniper, MD;  Location: [facility];  Service: Gastroenterology;;   CYSTO/  BILATERAL RETROGRADE PYELOGRAM  11/20/2000   CYSTO/  RIGHT RETROGRADE PYELOGRAM/  RIGHT URETEROSCOPY/  STENT PLACEMENT  12/16/2006   CYSTOSCOPY/URETEROSCOPY/HOLMIUM LASER/STENT PLACEMENT Left 11/09/2016   Procedure: CYSTOSCOPY/URETEROSCOPY/HOLMIUM LASER/STENT PLACEMENT;  Surgeon: Nickie Retort, MD;  Location: [facility];  Service: Urology;  Laterality: Left;  90 MINS  [phone]    ESOPHAGEAL MANOMETRY N/A 12/23/2014   Procedure: ESOPHAGEAL MANOMETRY (EM);  Surgeon: Garlan Fair, MD;  Location: [facility];  Service: Endoscopy;  Laterality: N/A;   ESOPHAGOGASTRODUODENOSCOPY  last one 03-28-2015   ESOPHAGOGASTRODUODENOSCOPY (EGD) WITH PROPOFOL N/A 10/01/2014   Procedure: ESOPHAGOGASTRODUODENOSCOPY (EGD) WITH PROPOFOL;  Surgeon: Garlan Fair, MD;  Location: [facility];  Service: Endoscopy;  Laterality: N/A;   ESOPHAGOGASTRODUODENOSCOPY (EGD) WITH PROPOFOL N/A 03/05/2021   Procedure: ESOPHAGOGASTRODUODENOSCOPY (EGD) WITH PROPOFOL;  Surgeon: Ronnette Juniper, MD;  Location: [facility];  Service: Gastroenterology;  Laterality: N/A;   EXTRACORPOREAL SHOCK WAVE LITHOTRIPSY  multiple times since age 74   HOLMIUM  LASER APPLICATION Left 07/02/4781   Procedure: HOLMIUM LASER APPLICATION;  Surgeon: Nickie Retort, MD;  Location: [facility];  Service: Urology;  Laterality: Left;   KNEE ARTHROSCOPY Right 03-04-2014  [facility]   w/ Arthrotomy   LAPAROSCOPIC ASSISTED VAGINAL HYSTERECTOMY  12/28/2005   LAPAROSCOPIC CHOLECYSTECTOMY  01/17/2004   LAPAROSCOPY LEFT OVARIAN CYSTECTOMY/  BILATERAL TUBAL LIGATION  02/26/2003   LEFT URETEROSCOPIC STONE EXTRACTION /  STENT PLACEMENT  07/09/2002   NECK EXPLORATION/  BILATERAL INFERIOR  PARATHYROIDECTOMY  08/03/2010   RIGHT URETERAL DILATION/  URETEROSCOPIC STONE EXTRACTION  06/12/2010   ROUX-EN-Y GASTRIC BYPASS  2001   SOLYX TRANSURETHRAL SLING/  POSTERIOR PELVIC FLOOR SACROSPINOUS REPAIR  02/21/2009   and Cysto/  Bilateral ureteral stent placement   TONSILLECTOMY AND ADENOIDECTOMY     WRIST GANGLION EXCISION Right 01/28/2000    Family History  Problem Relation Age of Onset   Bipolar disorder Son    Breast cancer Mother    Breast cancer Sister    Anxiety disorder Brother    Depression Brother    Breast cancer Maternal Aunt    Breast cancer Maternal Grandmother     Social:  reports that she has been smoking cigarettes. She has a 5.00 pack-year smoking history. She has never used smokeless tobacco. She reports current alcohol use. She reports that she does not use drugs.  Allergies:  Allergies  Allergen Reactions   Gabapentin Other (See Comments)    Leg swelling    Sulfasalazine Hives   Lamictal [Lamotrigine] Nausea And Vomiting and Other (See Comments)    Tremors, diplopia   Penicillins Hives and Itching    As a child, "breathing, itching problem" Has patient had a PCN reaction causing immediate rash, facial/tongue/throat swelling, SOB or lightheadedness with hypotension: Yes Has patient had a PCN reaction causing severe rash involving mucus membranes or skin necrosis: Yes Has patient had a PCN reaction that required hospitalization No Has patient had a PCN reaction occurring within the last 10 years: No If all of the above answers are "NO", then may proceed with Cephalosporin use.    Sulfa Antibiotics Hives   Tylenol [Acetaminophen]     Due to liver failure    Morphine And Related Itching and Rash    Medications: I have reviewed the patient's current medications.   ROS - all of the below systems have been reviewed with the patient and positives are indicated with bold text General: chills, fever or night sweats Eyes: blurry vision or double  vision ENT: epistaxis or sore throat Allergy/Immunology: itchy/watery eyes or nasal congestion Hematologic/Lymphatic: bleeding problems, blood clots or swollen lymph nodes Endocrine: temperature intolerance or unexpected weight changes Breast: new or changing breast lumps or nipple discharge Resp: cough, shortness of breath, or wheezing CV: chest pain or dyspnea on exertion GI: as per HPI GU: dysuria, trouble voiding, or hematuria MSK: joint pain or joint stiffness Neuro: TIA or stroke symptoms Derm: pruritus and skin lesion changes Psych: anxiety and depression  PE Blood pressure 110/71, pulse (!) 55, temperature 98.7 F (37.1 C), temperature source Oral, resp. rate 16, SpO2 100 %. Constitutional: NAD; conversant; no deformities Eyes: Moist conjunctiva; no lid lag; anicteric; PERRL Neck: Trachea midline; no thyromegaly Lungs: Normal respiratory effort; no tactile fremitus CV: RRR; no palpable thrills; no pitting edema GI: Abd soft, nt, nd, old trocar scars; no palpable hepatosplenomegaly MSK: Normal gait; no clubbing/cyanosis Psychiatric: Appropriate affect; alert and oriented x3 Lymphatic: No palpable cervical or axillary lymphadenopathy Skin:no rash/jaundice  No results found for this or any previous visit (from the past 48 hour(s)).  No results found.  Imaging: reviewed  A/P: Jordan Russell is an 50 y.o. female with  Hiatal hernia History of laparoscopic Roux-en-Y gastric bypass at outside institution Dysphagia Fibromyalgia Bipolar 1 Tobacco use   On imaging it appears that her entire gastric pouch is in her mediastinum/hiatal hernia.  This is causing her difficulty with eating solid food.  She reports substantial weight loss over the past several years.  She reports trouble with eating solid food  She understands that her ongoing tobacco use puts her at risk for marginal ulcer as well as recurrent hiatal hernia.  We will plan robotic repair of her hiatal  hernia with possible mesh, possible upper endoscopy  All of her questions were asked and answered  We will give her preoperative IV thiamine in addition to subcutaneous heparin for VTE prophylaxis and IV antibiotic on-call to surgery.  We rediscussed the typical hospitalization and the typical recovery.  Leighton Ruff. Redmond Pulling, MD, FACS General, Bariatric, & Minimally Invasive Surgery [facility], [place]

## 2021-08-10 NOTE — Transfer of Care (Signed)
Immediate Anesthesia Transfer of Care Note  Patient: Jordan Russell  Procedure(s) Performed: XI ROBOTIC ASSISTED HIATAL HERNIA REPAIR WITH MESH, ROBOTIC LYSIS OF ADHESIONS (Abdomen) UPPER GI ENDOSCOPY  Patient Location: PACU  Anesthesia Type:General  Level of Consciousness: drowsy  Airway & Oxygen Therapy: Patient Spontanous Breathing and Patient connected to face mask oxygen  Post-op Assessment: Report given to RN, Post -op Vital signs reviewed and stable and Patient moving all extremities X 4  Post vital signs: Reviewed and stable  Last Vitals:  Vitals Value Taken Time  BP 111/65 08/10/21 1652  Temp    Pulse 77 08/10/21 1653  Resp 16 08/10/21 1653  SpO2 100 % 08/10/21 1653  Vitals shown include unvalidated device data.  Last Pain:  Vitals:   08/10/21 1222  TempSrc:   PainSc: 0-No pain         Complications: No notable events documented.

## 2021-08-10 NOTE — Anesthesia Postprocedure Evaluation (Signed)
Anesthesia Post Note  Patient: Jordan Russell  Procedure(s) Performed: XI ROBOTIC ASSISTED HIATAL HERNIA REPAIR WITH MESH, ROBOTIC LYSIS OF ADHESIONS (Abdomen) UPPER GI ENDOSCOPY     Patient location during evaluation: PACU Anesthesia Type: General Level of consciousness: awake and alert and oriented Pain management: pain level controlled Vital Signs Assessment: post-procedure vital signs reviewed and stable Respiratory status: spontaneous breathing, nonlabored ventilation and respiratory function stable Cardiovascular status: blood pressure returned to baseline Postop Assessment: no apparent nausea or vomiting Anesthetic complications: no   No notable events documented.  Last Vitals:  Vitals:   08/10/21 1715 08/10/21 1730  BP: 110/86 120/88  Pulse: 72 72  Resp: 14 19  Temp:  (!) 36.4 C  SpO2: 100% 100%    Last Pain:  Vitals:   08/10/21 1730  TempSrc:   PainSc: Asleep   Pain Goal:                   Marthenia Rolling

## 2021-08-10 NOTE — Brief Op Note (Addendum)
08/10/2021  4:41 PM  PATIENT:  Jordan Russell  50 y.o. female  PRE-OPERATIVE DIAGNOSIS:  HIATAL HERNIA; history of Laparoscopic roux en y gastric bypass 2001  POST-OPERATIVE DIAGNOSIS:  same  PROCEDURE:  Procedure(s): XI ROBOTIC ASSISTED HIATAL HERNIA REPAIR WITH MESH, ROBOTIC LYSIS OF ADHESIONS (N/A) UPPER GI ENDOSCOPY (N/A) LAPAROSCOPIC BILATERAL TAP BLOCK  SURGEON:  Surgeon(s) and Role:    Greer Pickerel, MD - Primary    * Ralene Ok, MD - Assisting    * Kinsinger, Arta Bruce, MD - Assisting  PHYSICIAN ASSISTANT:   ASSISTANTS: see above   ANESTHESIA:   general  EBL:  40 ML   BLOOD ADMINISTERED:none  DRAINS: none   LOCAL MEDICATIONS USED:  OTHER EXPAREL   SPECIMEN:  No Specimen  DISPOSITION OF SPECIMEN:  N/A  COUNTS:  YES  TOURNIQUET:  * No tourniquets in log *  DICTATION: Viviann Spare Dictation [id number]   PLAN OF CARE: Admit for overnight observation  PATIENT DISPOSITION:  PACU - hemodynamically stable.   Delay start of Pharmacological VTE agent (>24hrs) due to surgical blood loss or risk of bleeding: no  Leighton Ruff. Redmond Pulling, MD, FACS General, Bariatric, & Minimally Invasive Surgery [facility], [place]

## 2021-08-11 ENCOUNTER — Observation Stay (HOSPITAL_COMMUNITY): Payer: BC Managed Care – PPO

## 2021-08-11 ENCOUNTER — Encounter (HOSPITAL_COMMUNITY): Payer: Self-pay | Admitting: General Surgery

## 2021-08-11 ENCOUNTER — Other Ambulatory Visit: Payer: Self-pay

## 2021-08-11 DIAGNOSIS — E039 Hypothyroidism, unspecified: Secondary | ICD-10-CM | POA: Diagnosis not present

## 2021-08-11 DIAGNOSIS — F17210 Nicotine dependence, cigarettes, uncomplicated: Secondary | ICD-10-CM | POA: Diagnosis not present

## 2021-08-11 DIAGNOSIS — Z8616 Personal history of COVID-19: Secondary | ICD-10-CM | POA: Diagnosis not present

## 2021-08-11 DIAGNOSIS — Z23 Encounter for immunization: Secondary | ICD-10-CM | POA: Diagnosis not present

## 2021-08-11 DIAGNOSIS — N189 Chronic kidney disease, unspecified: Secondary | ICD-10-CM | POA: Diagnosis not present

## 2021-08-11 DIAGNOSIS — Z01818 Encounter for other preprocedural examination: Secondary | ICD-10-CM | POA: Diagnosis not present

## 2021-08-11 DIAGNOSIS — J45909 Unspecified asthma, uncomplicated: Secondary | ICD-10-CM | POA: Diagnosis not present

## 2021-08-11 DIAGNOSIS — K449 Diaphragmatic hernia without obstruction or gangrene: Secondary | ICD-10-CM | POA: Diagnosis not present

## 2021-08-11 LAB — CBC
HCT: 36.8 % (ref 36.0–46.0)
Hemoglobin: 12.4 g/dL (ref 12.0–15.0)
MCH: 33.9 pg (ref 26.0–34.0)
MCHC: 33.7 g/dL (ref 30.0–36.0)
MCV: 100.5 fL — ABNORMAL HIGH (ref 80.0–100.0)
Platelets: 175 10*3/uL (ref 150–400)
RBC: 3.66 MIL/uL — ABNORMAL LOW (ref 3.87–5.11)
RDW: 13.5 % (ref 11.5–15.5)
WBC: 7.7 10*3/uL (ref 4.0–10.5)
nRBC: 0 % (ref 0.0–0.2)

## 2021-08-11 LAB — BASIC METABOLIC PANEL
Anion gap: 5 (ref 5–15)
BUN: 11 mg/dL (ref 6–20)
CO2: 23 mmol/L (ref 22–32)
Calcium: 9 mg/dL (ref 8.9–10.3)
Chloride: 106 mmol/L (ref 98–111)
Creatinine, Ser: 0.76 mg/dL (ref 0.44–1.00)
GFR, Estimated: >60 mL/min (ref >60)
Glucose, Bld: 144 mg/dL — ABNORMAL HIGH (ref 70–99)
Potassium: 4.9 mmol/L (ref 3.5–5.1)
Sodium: 134 mmol/L — ABNORMAL LOW (ref 135–145)

## 2021-08-11 MED ORDER — ONDANSETRON 8 MG PO TBDP: 8.0000 mg | ORAL_TABLET | Freq: Four times a day (QID) | ORAL | 0 refills | Status: DC | PRN

## 2021-08-11 MED ORDER — OXYCODONE HCL 5 MG PO TABS: 5.0000 mg | ORAL_TABLET | Freq: Four times a day (QID) | ORAL | 0 refills | Status: DC | PRN

## 2021-08-11 MED ORDER — IOHEXOL 300 MG/ML  SOLN
100.0000 mL | Freq: Once | INTRAMUSCULAR | Status: AC | PRN
  Administered 2021-08-11: 100 mL via ORAL

## 2021-08-11 MED ORDER — ENSURE MAX PROTEIN PO LIQD
11.0000 [oz_av] | Freq: Two times a day (BID) | ORAL | Status: DC
  Administered 2021-08-11: 11 [oz_av] via ORAL

## 2021-08-11 NOTE — Progress Notes (Signed)
Pt alert and oriented. Tolerating diet. D/C instructions given, pt d/cd home.

## 2021-08-11 NOTE — Discharge Instructions (Addendum)
Be mindful of taking both lasix and spironolactone while you are struggling with oral intake  EATING AFTER YOUR ESOPHAGEAL SURGERY (Stomach Fundoplication, Hiatal Hernia repair, Achalasia surgery, etc)  ######################################################################  EAT Start with a pureed / full liquid diet (see below) Gradually transition to a high fiber diet with a fiber supplement over the next month after discharge.    WALK Walk an hour a day.  Control your pain to do that.    CONTROL PAIN Control pain so that you can walk, sleep, tolerate sneezing/coughing, go up/down stairs.  HAVE A BOWEL MOVEMENT DAILY Keep your bowels regular to avoid problems.  OK to try a laxative to override constipation.  OK to use an antidairrheal to slow down diarrhea.  Call if not better after 2 tries  CALL IF YOU HAVE PROBLEMS/CONCERNS Call if you are still struggling despite following these instructions. Call if you have concerns not answered by these instructions  ######################################################################   After your esophageal surgery, expect some sticking with swallowing over the next 1-2 months.    If food sticks when you eat, it is called "dysphagia".  This is due to swelling around your esophagus at the wrap & hiatal diaphragm repair.  It will gradually ease off over the next few months.  To help you through this temporary phase, we start you out on a full liquid diet.  Your first meal in the hospital was thin liquids.  You should have been given a full liquid diet by the time you left the hospital. Stay on clears and full liquids for the first few days. Once tolerating that well, you can advance to pureed diet.   We ask patients to stay on a pureed diet for the first 2-3 weeks to avoid anything getting "stuck" near your recent surgery.  Don't be alarmed if your ability to swallow doesn't progress according to this plan.  Everyone is different and some diets  can advance more or less quickly.    It is often helpful to crush your medications or split them as they can sometimes stick, especially the first week or so.   Some BASIC RULES to follow are: Maintain an upright position whenever eating or drinking. Take small bites - just a teaspoon size bite at a time. Eat slowly.  It may also help to eat only one food at a time. Consider nibbling through smaller, more frequent meals & avoid the urge to eat BIG meals Do not push through feelings of fullness, nausea, or bloatedness Do not mix solid foods and liquids in the same mouthful Try not to "wash foods down" with large gulps of liquids. Avoid carbonated (bubbly/fizzy) drinks.   Avoid foods that make you feel gassy or bloated.  Start with bland foods first.  Wait on trying greasy, fried, or spicy meals until you are tolerating more bland solids well. Understand that it will be hard to burp and belch at first.  This gradually improves with time.  Expect to be more gassy/flatulent/bloated initially.  Walking will help your body manage it better. Consider using medications for bloating that contain simethicone such as  Maalox or Gas-X  Consider crushing her medications, especially smaller pills.  The ability to swallow pills should get easier after a few weeks Eat in a relaxed atmosphere & minimize distractions. Avoid talking while eating.   Do not use straws. Following each meal, sit in an upright position (90 degree angle) for 60 to 90 minutes.  Going for a short walk can  help as well If food does stick, don't panic.  Try to relax and let the food pass on its own.  Sipping WARM LIQUID such as strong hot black tea can also help slide it down.   Be gradual in changes & use common sense:  -If you easily tolerating a certain "level" of foods, advance to the next level gradually -If you are having trouble swallowing a particular food, then avoid it.   -If food is sticking when you advance your diet, go  back to thinner previous diet (the lower LEVEL) for 1-2 days.  LEVEL 2 = PUREED DIET  Do for the first 2 WEEKS AFTER SURGERY AFTER YOU ARE TOLERATING A FULL LIQUID DIET EASILY  -Foods in this group are pureed or blenderized to a smooth, mashed potato-like consistency.  -If necessary, the pureed foods can keep their shape with the addition of a thickening agent.   -Meat should be pureed to a smooth, pasty consistency.  Hot broth or gravy may be added to the pureed meat, approximately 1 oz. of liquid per 3 oz. serving of meat. -CAUTION:  If any foods do not puree into a smooth consistency, swallowing will be more difficult.  (For example, nuts or seeds sometimes do not blend well.)  Hot Foods Cold Foods  Pureed scrambled eggs and cheese Pureed cottage cheese  Baby cereals Thickened juices and nectars  Thinned cooked cereals (no lumps) Thickened milk or eggnog  Pureed [place] toast or pancakes Ensure  Mashed potatoes Ice cream  Pureed parsley, au gratin, scalloped potatoes, candied sweet potatoes Fruit or [place] ice, sherbet  Pureed buttered or alfredo noodles Plain yogurt  Pureed vegetables (no corn or peas) Instant breakfast  Pureed soups and creamed soups Smooth pudding, mousse, custard  Pureed scalloped apples Whipped gelatin  Gravies Sugar, syrup, honey, jelly  Sauces, cheese, tomato, barbecue, white, creamed Cream  Any baby food Creamer  Alcohol in moderation (not beer or champagne) Margarine  Coffee or tea Mayonnaise   Ketchup, mustard   Apple sauce   SAMPLE MENU:  PUREED DIET Breakfast Lunch Dinner  Orange juice, 1/2 cup Cream of wheat, 1/2 cup Pineapple juice, 1/2 cup Pureed [place], barley soup, 3/4 cup Pureed Hawaiian chicken, 3 oz  Scrambled eggs, mashed or blended with cheese, 1/2 cup Tea or coffee, 1 cup  Whole milk, 1 cup  Non-dairy creamer, 2 Tbsp. Mashed potatoes, 1/2 cup Pureed cooled broccoli, 1/2 cup Apple sauce, 1/2 cup Coffee or tea Mashed potatoes, 1/2  cup Pureed spinach, 1/2 cup Frozen yogurt, 1/2 cup Tea or coffee      LEVEL 3 = SOFT DIET  After your first 3 weeks, you can advance to a soft diet.   Keep on this diet until everything goes down easily.  Hot Foods Cold Foods  White fish Cottage cheese  Stuffed fish Junior baby fruit  Baby food meals Semi thickened juices  Minced soft cooked, scrambled, poached eggs nectars  Souffle & omelets Ripe mashed bananas  Cooked cereals Canned fruit, pineapple sauce, milk  potatoes Milkshake  Buttered or Alfredo noodles Custard  Cooked cooled vegetable Puddings, including tapioca  Sherbet Yogurt  Vegetable soup or alphabet soup Fruit ice, [place] ice  Gravies Whipped gelatin  Sugar, syrup, honey, jelly Junior baby desserts  Sauces:  Cheese, creamed, barbecue, tomato, white Cream  Coffee or tea Margarine   SAMPLE MENU:  LEVEL 3 Breakfast Lunch Dinner  Orange juice, 1/2 cup Oatmeal, 1/2 cup Scrambled eggs with cheese, 1/2 cup  Decaffeinated tea, 1 cup Whole milk, 1 cup Non-dairy creamer, 2 Tbsp Pineapple juice, 1/2 cup Minced beef, 3 oz Gravy, 2 Tbsp Mashed potatoes, 1/2 cup Minced fresh broccoli, 1/2 cup Applesauce, 1/2 cup Coffee, 1 cup [place], barley soup, 3/4 cup Minced Hawaiian chicken, 3 oz Mashed potatoes, 1/2 cup Cooked spinach, 1/2 cup Frozen yogurt, 1/2 cup Non-dairy creamer, 2 Tbsp      LEVEL 4 = CHOPPED DIET  -After all the foods in level 3 (soft diet) are passing through well you should advance up to more chopped foods.  -It is still important to cut these foods into small pieces and eat slowly.  Hot Foods Cold Foods  Poultry Cottage cheese  Chopped Swedish meatballs Yogurt  Meat salads (ground or flaked meat) Milk  Flaked fish (tuna) Milkshakes  Poached or scrambled eggs Soft, cold, dry cereal  Souffles and omelets Fruit juices or nectars  Cooked cereals Chopped canned fruit  Chopped [place] toast or pancakes Canned fruit cocktail  Noodles or pasta  (no rice) Pudding, mousse, custard  Cooked vegetables (no frozen peas, corn, or mixed vegetables) Green salad  Canned small sweet peas Ice cream  Creamed soup or vegetable soup Fruit ice, [place] ice  Pureed vegetable soup or alphabet soup Non-dairy creamer  Ground scalloped apples Margarine  Gravies Mayonnaise  Sauces:  Cheese, creamed, barbecue, tomato, white Ketchup  Coffee or tea Mustard   SAMPLE MENU:  LEVEL 4 Breakfast Lunch Dinner  Orange juice, 1/2 cup Oatmeal, 1/2 cup Scrambled eggs with cheese, 1/2 cup Decaffeinated tea, 1 cup Whole milk, 1 cup Non-dairy creamer, 2 Tbsp Ketchup, 1 Tbsp Margarine, 1 tsp Salt, 1/4 tsp Sugar, 2 tsp Pineapple juice, 1/2 cup Ground beef, 3 oz Gravy, 2 Tbsp Mashed potatoes, 1/2 cup Cooked spinach, 1/2 cup Applesauce, 1/2 cup Decaffeinated coffee Whole milk Non-dairy creamer, 2 Tbsp Margarine, 1 tsp Salt, 1/4 tsp Pureed [place], barley soup, 3/4 cup Barbecue chicken, 3 oz Mashed potatoes, 1/2 cup Ground fresh broccoli, 1/2 cup Frozen yogurt, 1/2 cup Decaffeinated tea, 1 cup Non-dairy creamer, 2 Tbsp Margarine, 1 tsp Salt, 1/4 tsp Sugar, 1 tsp    LEVEL 5:  REGULAR FOODS  -Foods in this group are soft, moist, regularly textured foods.   -This level includes meat and breads, which tend to be the hardest things to swallow.   -Eat very slowly, chew well and continue to avoid carbonated drinks. -most people are at this level in 6 weeks  Hot Foods Cold Foods  Baked fish or skinned Soft cheeses - cottage cheese  Souffles and omelets Cream cheese  Eggs Yogurt  Stuffed shells Milk  Spaghetti with meat sauce Milkshakes  Cooked cereal Cold dry cereals (no nuts, dried fruit, coconut)  [place] toast or pancakes Crackers  Buttered toast Fruit juices or nectars  Noodles or pasta (no rice) Canned fruit  Potatoes (all types) Ripe bananas  Soft, cooked vegetables (no corn, lima, or baked beans) Peeled, ripe, fresh fruit  Creamed soups or  vegetable soup Cakes (no nuts, dried fruit, coconut)  Canned chicken noodle soup Plain doughnuts  Gravies Ice cream  Bacon dressing Pudding, mousse, custard  Sauces:  Cheese, creamed, barbecue, tomato, white Fruit ice, [place] ice, sherbet  Decaffeinated tea or coffee Whipped gelatin  Pork chops Regular gelatin   Canned fruited gelatin molds   Sugar, syrup, honey, jam, jelly   Cream   Non-dairy   Margarine   Oil   Mayonnaise   Ketchup   Mustard   TROUBLESHOOTING  IRREGULAR BOWELS  1) Avoid extremes of bowel movements (no bad constipation/diarrhea)  2) Miralax 17gm mixed in 8oz. water or juice-daily. May use BID as needed.  3) Gas-x,Phazyme, etc. as needed for gas & bloating.  4) Soft,bland diet. No spicy,greasy,fried foods.  5) Prilosec over-the-counter as needed  6) May hold gluten/wheat products from diet to see if symptoms improve.  7) May try probiotics (Align, Activa, etc) to help calm the bowels down  7) If symptoms become worse call back immediately.    If you have any questions please call our office at [place]: [phone].

## 2021-08-11 NOTE — Op Note (Signed)
NAME: Jordan Russell, KILBURG MEDICAL RECORD NO: [id number] ACCOUNT NO: [id number] DATE OF BIRTH: Feb 27, 1971 FACILITY: [facility] LOCATION: [id number] PHYSICIAN: Leighton Ruff. Redmond Pulling, MD  Operative Report   DATE OF PROCEDURE: 08/10/2021  PREOPERATIVE DIAGNOSES:   1.  Hiatal hernia. 2.  History of laparoscopic Roux-en-Y gastric bypass in 2001.  POSTOPERATIVE DIAGNOSES:   1.  Hiatal hernia. 2.  History of laparoscopic Roux-en-Y gastric bypass in 2001  PROCEDURES:    1.  Robotic-assisted hiatal hernia repair with mesh. 2.  Robotic lysis of adhesions. 3.  Laparoscopic bilateral TAP block. 4.  Upper GI endoscopy.  SURGEON:  Greer Pickerel, MD  ASSISTANT SURGEON:  1. Dr. Gurney Maxin. 2. Ralene Ok, MD  ANESTHESIA:  General.  ESTIMATED BLOOD LOSS:  40 mL.  DRAINS:  None.  SPECIMENS:  None.  INDICATIONS FOR PROCEDURE:  The patient is a 50 year old female who had a remote history of a laparoscopic Roux-en-Y gastric bypass around 2001 in [place], [place].  She states that she had not really been able to eat normally since around 2019.   She had regained all of her weight back after her gastric bypass, but starting in 2019, after prolonged hospitalization at [facility], she states that she has had significant difficulty eating, tolerating solid foods and even thick liquids.  She has lost by  report 200 pounds since 2019.  Upper endoscopy revealed a 5 cm hiatal hernia and that the gastric pouch was herniated into the mediastinum.  Upper GI confirmed this. The CT imaging also confirmed that her pouch was in her mediastinum.  She desired  surgical repair.  I had a long conversation with the patient regarding risks and benefits including but not limited to bleeding, infection, recurrence, injury to surrounding structures such as the esophagus or bowel, perioperative cardiac and pulmonary  events, blood clot formation, persistent dysphagia, ongoing reflux.  All of her questions were asked and  answered.  DESCRIPTION OF PROCEDURE:  The patient was given thiamine preoperatively because of a chronic thiamine deficiency.  She was also given 5000 units of subcutaneous heparin and oral Tylenol.  She was taken to OR #5 at [facility] and placed supine  on the operating room table.  General endotracheal anesthesia was established.  Sequential compression devices were placed.  She received IV antibiotic prior to skin incision.  A surgical timeout was performed.  Her abdomen was accessed using the Optiview technique.  A small incision was made in the left midclavicular line at the level of the umbilicus.  Then, using a 0-degree, 5 mm laparoscope, I advanced it through the abdominal wall through all layers and  carefully entered the abdominal cavity.  Pneumoperitoneum was smoothly established.  The laparoscope was advanced and the abdominal cavity was surveyed.  There was no evidence of injury to surrounding structures.  The patient had no adhesions to her  anterior abdominal wall.  A bilateral laparoscopic TAP block consisting of Marcaine, Exparel was infiltrated on both sides for postoperative pain relief.  An 8 mm robotic trocar was placed slightly above and to the left of the umbilicus.  Another 8 mm  robotic trocar in the right mid abdomen.  The Optiview trocar was exchanged for an 8 mm robotic trocar and a final 8 mm robotic trocar was placed in the left lateral abdominal wall in the mid axillary line, all under direct visualization.  A 12 mm  assistant trocar was placed in the left lower quadrant under direct visualization.  The patient was placed  in extreme reverse Trendelenburg.  A subxiphoid incision was made and a Nathanson liver retractor was placed to lift up the left lobe of the  liver.  Her Roux limb was adhered to the undersurface of the left lobe of the liver.  The [employer] robot was then brought in and docked.  Anatomy was targeted.  A fenestrated bipolar was placed in arm 1,  camera in arm #2, scissors in arm 3 and a  tip-up grasper in arm 4.  At this point, I went to the robotic console.  My assistant Dr. Kieth Brightly was at the bedside.  He assisted in retraction.  First, the goal was to take down the adhesions of the Roux limb to the left side of the liver.  This was  done with a combination of scissors with &without electrocautery.  Once I had completely taken down and mobilized the Roux limb from the undersurface of the left lobe of the liver, the assistant was able to reposition the liver retractor to further expose the  diaphragm.  This revealed at least a 5 cm hiatal hernia.  It appeared that her entire pouch was in fact in her mediastinum as her preoperative imaging suggested.  We could visualize the Roux limb as well as a candy cane limb.  The candy cane was intimately  adhered to the Roux limb.  The candy cane was facing on the patient's left side.  There were also some omental adhesions to the diaphragm, which were taken down at this point, with the vessel sealer.  I first started mobilizing the herniated contents on  the patient's right side.  The gastrohepatic ligament was incised with the vessel sealer.  We identified the hernia sac on the right.  The right crus was identified and gently retracted.  I started mobilizing, up the right crus, up toward the anterior  mediastinum.  This was a combination of blunt dissection, with and without energy on the vessel sealer.  Came across anteriorly.  We then turned our attention to the left crus.  This is a little bit more challenging just because of the amount of scar  tissue in this area.  It was a little bit unclear what could be hernia sac versus potential pouch.  We had anesthesia place a Ewald tube.  We could visualize the Ewald  tube coming down the mediastinum and we could see the esophagus and the pouch and it went down the Roux limb.  There was some thick scar tissue on the left side.  It appeared to be hernia sac.  It  was not the pouch.  I continued a circumferential mobilization of the pouch and distal esophagus with a combination of  gentle blunt dissection with the vessel sealer with and without energy as needed.  Dr. Kieth Brightly had to leave and so Dr. Rosendo Gros scrubbed in.  We identified the aorta.  I identified both the anterior and posterior vagus nerves.  In order to aid in  circumferential mobilization, I had anesthesia pull back the Ewald tube into the upper esophagus.  I was able to get up into the mediastinum.  I continued to mobilize the distal esophagus and mid esophagus from the left and right pleura.  I did not see  any lung parenchyma.  We continued to mobilize the esophagus posteriorly off the aorta.  I had used the tip-up grasper to hook the gastric pouch in order for me to visualize the posterior contents between the esophagus and the aorta.  We then switched  anteriorly.  I left the console and then went and did an upper endoscopy.  The endoscope was placed in the patient's mouth and gently guided down the esophagus.  She did have some one or two linear breaks at the GE junction.  I was able to enter the  gastric pouch.  We identified the gastrojejunal anastomosis.  I was watching this robotically on the laparoscopic monitors as well.  It appeared that we had gotten the pouch out of the chest and it was in the abdominal cavity.  I went back to the robotic  console and continued some more proximal circumferential mobilization, taking down the avascular attachments and again preserving both the anterior and posterior vagus nerves.  At this time, we had achieved full reduction of the herniated gastric pouch  into the abdominal cavity and 1 cm of intraabdominal esophageal length.  I did not feel I could do any more mobilization posteriorly or laterally of the thoracic esophagus because it was pretty well mobilized, but it was a little tethered anteriorly up retrocardiac.  I then  reapproximated the left and  right crura with four interrupted 0 Ethibond sutures.  We had reduced pneumoperitoneum to 8 mmHg.  The crura were closed not under tension.  I decided to reinforce the cruroplasty with placement of absorbable mesh, a piece of  hiatal hernia Dionne Bucy Bio-A mesh was slightly trimmed by my assistant and placed within the abdominal cavity.  It was then laid over the cruroplasty.  The U-shape was pointing anteriorly.  A suture was placed on the right side of the crus to secure it  to the diaphragm with 0 Ethibond.  I then placed 2 sutures through the mesh into the cruroplasty posteriorly and then the left side of the mesh was then anchored to the left upper diaphragm with two 0 Ethibond sutures as well.  The mesh laid flat.  There  was no redundancy.  I then went and obtained the Olympus endoscope and went back down her esophagus with the endoscope.  There was no evidence of esophageal injury.  The gastric pouch was insufflated.  I was able to cannulate the gastrojejunal  anastomosis.  It did look a little bit irritated from, but there was no evidence of active ulcer.  It preferentially emptied into the Roux limb. Again, I withdrew the scope and looked at the esophagus.  There is no evidence of esophageal injury.  I went  back down and desufflated the pouch and the Roux limb and withdrew the endoscope.  I scrubbed back in.  We inspected the abdominal cavity where the assistant had been inserting instruments and needles and there is no evidence of injury to the bowel.  The  robot was undocked.  The 11 mm assistant trocar site was closed with 2 interrupted 0 Vicryls using a PMI suture passer.  Local was infiltrated in this area.  Pneumoperitoneum was released.  Trocars were removed.  Skin incisions were closed with a 4-0  Monocryl in a subcuticular fashion, followed by the application of Dermabond.  There were no immediate complications.  The patient tolerated the procedure well.  She was taken to the recovery room  in stable condition.   MUK D: 08/10/2021 5:56:58 pm T: 08/11/2021 1:00:00 am  JOB: [id number]/ [id number]

## 2021-08-11 NOTE — Discharge Summary (Signed)
Physician Discharge Summary  Jordan Russell KWI:[id number] DOB: October 15, 1971 DOA: 08/10/2021  PCP: Josetta Huddle, MD  Admit date: 08/10/2021 Discharge date: 08/11/2021  Recommendations for Outpatient Follow-up:     Follow-up Information     Greer Pickerel, MD. Go on 09/02/2021.   Specialty: General Surgery Why: arrive 09/02/2021; arrive at 0830, For wound re-check Contact information: [place] [address] [phone]                Discharge Diagnoses:  1.  Hiatal hernia. 2.  History of laparoscopic Roux-en-Y gastric bypass in 2001 3. Bipolar d/o 4. Fibromyalgia 5. Tobacco use  Surgical Procedure: 1.  Robotic-assisted hiatal hernia repair with mesh. 2.  Robotic lysis of adhesions. 3.  Laparoscopic bilateral TAP block. 4.  Upper GI endoscopy.  Discharge Condition: good Disposition: home   Diet recommendation: full liquid diet + shakes  Filed Weights   08/10/21 1223  Weight: 77.6 kg    History of present illness:  Pt presented repair of hiatal hernia. Her gastric pouch was in her mediastinum causing weight loss, reflux, trouble with PO intake  Hospital Course:  She received IV thiamine preop bc of her chronic thiamine deficiency even though she stated she had been taking daily thiamine, cooper, B12. Underwent above mentioned procedure. Started on clears pod 0. Maintained on perioperative chemical vte prophylaxis. Pod 1 - no fever. No tachycardia. A little soft BP - high 90s but no signs of bleeding. Cbc stable. UGI showed no leak and expected postop appearance. She had some mild nausea and initial sensation of slowly tolerating liquid but she was able to tolerate an entire shake + water+ gingerale (her request) and requested to be discharged. She was felt stable. Her pain was controlled. Ambulated in halls. Dicussed discharge diet and diet progression.   Will have discomfort at LLQ trocar site - stitch in muscle.   BP 94/60 (BP  Location: Left Arm)   Pulse 64   Temp 98.7 F (37.1 C) (Oral)   Resp 18   Ht 5\' 6"  (1.676 m)   Wt 77.6 kg   SpO2 94%   BMI 27.61 kg/m   Gen: alert, NAD, non-toxic appearing Pupils: equal, no scleral icterus Pulm: Lungs clear to auscultation, symmetric chest rise CV: regular rate and rhythm Abd: soft, mild tender, nondistended.  No cellulitis. No incisional hernia Ext: no edema, no calf tenderness Skin: no rash, no jaundice    Discharge Instructions  Discharge Instructions     Call MD for:   Complete by: As directed    Temperature >101   Call MD for:  hives   Complete by: As directed    Call MD for:  persistant dizziness or light-headedness   Complete by: As directed    Call MD for:  persistant nausea and vomiting   Complete by: As directed    Call MD for:  redness, tenderness, or signs of infection (pain, swelling, redness, odor or green/yellow discharge around incision site)   Complete by: As directed    Call MD for:  severe uncontrolled pain   Complete by: As directed    Diet full liquid   Complete by: As directed    Discharge instructions   Complete by: As directed    See [facility] discharge instructions   Increase activity slowly   Complete by: As directed       Allergies as of 08/11/2021       Reactions   Gabapentin Other (See  Comments)   Leg swelling   Sulfasalazine Hives   Lamictal [lamotrigine] Nausea And Vomiting, Other (See Comments)   Tremors, diplopia   Penicillins Hives, Itching   As a child, "breathing, itching problem" Has patient had a PCN reaction causing immediate rash, facial/tongue/throat swelling, SOB or lightheadedness with hypotension: Yes Has patient had a PCN reaction causing severe rash involving mucus membranes or skin necrosis: Yes Has patient had a PCN reaction that required hospitalization No Has patient had a PCN reaction occurring within the last 10 years: No If all of the above answers are "NO", then may proceed with  Cephalosporin use.   Sulfa Antibiotics Hives   Tylenol [acetaminophen]    Due to liver failure   Morphine And Related Itching, Rash        Medication List     STOP taking these medications    ibuprofen 200 MG tablet Commonly known as: ADVIL       TAKE these medications    ALPRAZolam 0.5 MG tablet Commonly known as: XANAX Take 0.5 mg by mouth 3 (three) times daily as needed for anxiety.   dicyclomine 20 MG tablet Commonly known as: BENTYL Take 20 mg by mouth 3 (three) times daily as needed for spasms.   escitalopram 20 MG tablet Commonly known as: LEXAPRO Take 20 mg by mouth in the morning.   esomeprazole 40 MG capsule Commonly known as: NEXIUM Take 40 mg by mouth 2 (two) times daily.   folic acid 1 MG tablet Commonly known as: FOLVITE Take 1 mg by mouth in the morning.   furosemide 20 MG tablet Commonly known as: LASIX Take 20 mg by mouth in the morning.   lactulose 10 GM/15ML solution Commonly known as: CHRONULAC Place 45 mLs (30 g total) into feeding tube every 6 (six) hours. What changed:  when to take this reasons to take this   levothyroxine 88 MCG tablet Commonly known as: SYNTHROID Take 88 mcg by mouth daily before breakfast.   Linzess 290 MCG Caps capsule Generic drug: linaclotide Take 290 mcg by mouth daily before breakfast.   lithium 300 MG tablet Take 300 mg by mouth 2 (two) times daily.   ondansetron 8 MG disintegrating tablet Commonly known as: ZOFRAN-ODT Take 1 tablet (8 mg total) by mouth every 6 (six) hours as needed for nausea.   oxyCODONE 5 MG immediate release tablet Commonly known as: Oxy IR/ROXICODONE Take 1 tablet (5 mg total) by mouth every 6 (six) hours as needed for severe pain or breakthrough pain.   Refresh Optive Mega-3 0.5-1-0.5 % Soln Generic drug: Carboxymeth-Glyc-Polysorb PF Place 1 drop into both eyes 3 (three) times daily as needed (dry/irritated eyes.).   rifaximin 550 MG Tabs tablet Commonly known as:  XIFAXAN Take 550 mg by mouth 2 (two) times daily.   rizatriptan 10 MG tablet Commonly known as: MAXALT Take 10 mg by mouth every 2 (two) hours as needed for migraine.   spironolactone 50 MG tablet Commonly known as: ALDACTONE Take 50 mg by mouth in the morning.   topiramate 25 MG tablet Commonly known as: TOPAMAX Take 25 mg by mouth 2 (two) times daily.   traZODone 100 MG tablet Commonly known as: DESYREL Take 200 mg by mouth at bedtime.        Follow-up Information     Greer Pickerel, MD. Go on 09/02/2021.   Specialty: General Surgery Why: arrive 09/02/2021; arrive at 0830, For wound re-check Contact information: [place]  [id number] [phone]                  The results of significant diagnostics from this hospitalization (including imaging, microbiology, ancillary and laboratory) are listed below for reference.    Significant Diagnostic Studies: DG UGI W SINGLE CM (SOL OR THIN BA)  Result Date: 08/11/2021 CLINICAL DATA:  Postop day 1 for hiatal hernia repair. EXAM: WATER SOLUBLE UPPER GI SERIES TECHNIQUE: Single-column upper GI series was performed using water soluble contrast. CONTRAST:  100 cc of Omnipaque 300 COMPARISON:  CTs of 05/26/2021 FLUOROSCOPY TIME:  Fluoroscopy Time:  1 minute and 36 seconds Radiation Exposure Index (if provided by the fluoroscopic device): 34.3 mGy Number of Acquired Spot Images: 0 FINDINGS: Preprocedure scout film demonstrates subcutaneous emphysema about the right abdominal wall. Cholecystectomy clips. Convex left lumbar spine curvature with presumed phleboliths in the pelvis. Focused, single-contrast exam performed with patient in supine and mild obliquities. Narrowing at the gastroesophageal junction is likely due to fundoplication. Patient is status post gastric bypass with prompt passage of contrast into the jejunum. No evidence of contrast extravasation to suggest postoperative leak. No postoperative  obstruction. IMPRESSION: Expected appearance after hiatal hernia repair and remote gastric bypass. No postoperative complication identified. Electronically Signed   By: Abigail Miyamoto M.D.   On: 08/11/2021 09:26   LONG TERM MONITOR (3-14 DAYS)  Result Date: 07/21/2021 Patch Wear Time:  13 days and 8 hours (2022-08-29T16:55:55-0400 to 2022-09-12T01:42:50-0400) Patient had a min HR of 34 bpm, max HR of 114 bpm, and avg HR of 58 bpm. Predominant underlying rhythm was Sinus Rhythm. 1 run of Supraventricular Tachycardia occurred lasting 8 beats with a max rate of 114 bpm (avg 102 bpm). Idioventricular Rhythm was present. Isolated SVEs were rare (<1.0%), SVE Couplets were rare (<1.0%), and SVE Triplets were rare (<1.0%). Isolated VEs were rare (<1.0%), VE Couplets were rare (<1.0%), and no VE Triplets were present. Ventricular Bigeminy was present. Summary conclusions: 1 run of supraventricular tachycardia only 8 beats at rate of 114 Multiple trigger events showing sinus rhythm   Microbiology: No results found for this or any previous visit (from the past 240 hour(s)).   Labs: Basic Metabolic Panel: Recent Labs  Lab 08/11/21 0348  NA 134*  K 4.9  CL 106  CO2 23  GLUCOSE 144*  BUN 11  CREATININE 0.76  CALCIUM 9.0   Liver Function Tests: No results for input(s): AST, ALT, ALKPHOS, BILITOT, PROT, ALBUMIN in the last 168 hours. No results for input(s): LIPASE, AMYLASE in the last 168 hours. No results for input(s): AMMONIA in the last 168 hours. CBC: Recent Labs  Lab 08/11/21 0348  WBC 7.7  HGB 12.4  HCT 36.8  MCV 100.5*  PLT 175   Cardiac Enzymes: No results for input(s): CKTOTAL, CKMB, CKMBINDEX, TROPONINI in the last 168 hours. BNP: BNP (last 3 results) No results for input(s): BNP in the last 8760 hours.  ProBNP (last 3 results) No results for input(s): PROBNP in the last 8760 hours.  CBG: No results for input(s): GLUCAP in the last 168 hours.  Active Problems:   History of  repair of hiatal hernia   Time coordinating discharge: 20 min  Signed:  [place] [id number] [facility], [place] [phone] 08/11/2021, 4:25 PM

## 2021-08-12 ENCOUNTER — Ambulatory Visit (HOSPITAL_BASED_OUTPATIENT_CLINIC_OR_DEPARTMENT_OTHER): Payer: BC Managed Care – PPO | Admitting: Cardiology

## 2021-08-14 ENCOUNTER — Telehealth: Payer: Self-pay | Admitting: Cardiology

## 2021-08-14 NOTE — Telephone Encounter (Signed)
Spoke to them at [facility] just now. At this time I was told that the case was closed and a peer to peer would have to be completed in order for this to be reconsidered. They only give you 10 days to request a peer to peer review. I was told that this is starting today 08/14/2021. The number to complete this is [phone]. It must be a NP, PA, or MD to complete the peer to peer.   When calling give reference number [id number].

## 2021-08-14 NOTE — Telephone Encounter (Signed)
Dr. ordered an ultrasound picture of your heart these test should be used when there are signs and symptoms of a heart problem, these signs could include a heart murmur or certain abnormal findings on your heart tracing ( EKG) these symptoms could include trouble breathing or fainting, we reviewed the notes we have, the notes do not show that you have such signs or symptoms based on the information we have this test isnt medically necessary. REF# [id number]. Phone# [phone]

## 2021-08-14 NOTE — Addendum Note (Signed)
Addended by: Resa Miner I on: 08/14/2021 03:28 PM   Modules accepted: Orders

## 2021-08-16 ENCOUNTER — Emergency Department (HOSPITAL_BASED_OUTPATIENT_CLINIC_OR_DEPARTMENT_OTHER)
Admission: EM | Admit: 2021-08-16 | Discharge: 2021-08-16 | Disposition: A | Discharge status: Home / Self Care | Payer: BC Managed Care – PPO | Attending: Emergency Medicine | Admitting: Emergency Medicine

## 2021-08-16 ENCOUNTER — Encounter (HOSPITAL_BASED_OUTPATIENT_CLINIC_OR_DEPARTMENT_OTHER): Payer: Self-pay | Admitting: Emergency Medicine

## 2021-08-16 ENCOUNTER — Other Ambulatory Visit: Payer: Self-pay

## 2021-08-16 ENCOUNTER — Emergency Department (HOSPITAL_BASED_OUTPATIENT_CLINIC_OR_DEPARTMENT_OTHER): Payer: BC Managed Care – PPO

## 2021-08-16 DIAGNOSIS — R58 Hemorrhage, not elsewhere classified: Secondary | ICD-10-CM

## 2021-08-16 DIAGNOSIS — F17210 Nicotine dependence, cigarettes, uncomplicated: Secondary | ICD-10-CM | POA: Diagnosis not present

## 2021-08-16 DIAGNOSIS — L7632 Postprocedural hematoma of skin and subcutaneous tissue following other procedure: Secondary | ICD-10-CM | POA: Diagnosis not present

## 2021-08-16 DIAGNOSIS — N189 Chronic kidney disease, unspecified: Secondary | ICD-10-CM | POA: Insufficient documentation

## 2021-08-16 DIAGNOSIS — Y834 Other reconstructive surgery as the cause of abnormal reaction of the patient, or of later complication, without mention of misadventure at the time of the procedure: Secondary | ICD-10-CM | POA: Diagnosis not present

## 2021-08-16 DIAGNOSIS — N9489 Other specified conditions associated with female genital organs and menstrual cycle: Secondary | ICD-10-CM | POA: Insufficient documentation

## 2021-08-16 DIAGNOSIS — Z79899 Other long term (current) drug therapy: Secondary | ICD-10-CM | POA: Insufficient documentation

## 2021-08-16 DIAGNOSIS — J45909 Unspecified asthma, uncomplicated: Secondary | ICD-10-CM | POA: Insufficient documentation

## 2021-08-16 DIAGNOSIS — Z20822 Contact with and (suspected) exposure to covid-19: Secondary | ICD-10-CM | POA: Insufficient documentation

## 2021-08-16 DIAGNOSIS — G8918 Other acute postprocedural pain: Secondary | ICD-10-CM

## 2021-08-16 DIAGNOSIS — E039 Hypothyroidism, unspecified: Secondary | ICD-10-CM | POA: Insufficient documentation

## 2021-08-16 DIAGNOSIS — R109 Unspecified abdominal pain: Secondary | ICD-10-CM | POA: Diagnosis not present

## 2021-08-16 DIAGNOSIS — R1032 Left lower quadrant pain: Secondary | ICD-10-CM | POA: Diagnosis not present

## 2021-08-16 DIAGNOSIS — R197 Diarrhea, unspecified: Secondary | ICD-10-CM

## 2021-08-16 LAB — CBC WITH DIFFERENTIAL/PLATELET
Abs Immature Granulocytes: 0.08 10*3/uL — ABNORMAL HIGH (ref 0.00–0.07)
Basophils Absolute: 0 10*3/uL (ref 0.0–0.1)
Basophils Relative: 1 %
Eosinophils Absolute: 0.3 10*3/uL (ref 0.0–0.5)
Eosinophils Relative: 4 %
HCT: 40.1 % (ref 36.0–46.0)
Hemoglobin: 13.3 g/dL (ref 12.0–15.0)
Immature Granulocytes: 1 %
Lymphocytes Relative: 27 %
Lymphs Abs: 1.8 10*3/uL (ref 0.7–4.0)
MCH: 33.1 pg (ref 26.0–34.0)
MCHC: 33.2 g/dL (ref 30.0–36.0)
MCV: 99.8 fL (ref 80.0–100.0)
Monocytes Absolute: 0.6 10*3/uL (ref 0.1–1.0)
Monocytes Relative: 9 %
Neutro Abs: 4.1 10*3/uL (ref 1.7–7.7)
Neutrophils Relative %: 58 %
Platelets: 272 10*3/uL (ref 150–400)
RBC: 4.02 MIL/uL (ref 3.87–5.11)
RDW: 14.7 % (ref 11.5–15.5)
WBC: 6.9 10*3/uL (ref 4.0–10.5)
nRBC: 0 % (ref 0.0–0.2)

## 2021-08-16 LAB — COMPREHENSIVE METABOLIC PANEL
ALT: 28 U/L (ref 0–44)
AST: 12 U/L — ABNORMAL LOW (ref 15–41)
Albumin: 3.7 g/dL (ref 3.5–5.0)
Alkaline Phosphatase: 141 U/L — ABNORMAL HIGH (ref 38–126)
Anion gap: 9 (ref 5–15)
BUN: 11 mg/dL (ref 6–20)
CO2: 23 mmol/L (ref 22–32)
Calcium: 9.1 mg/dL (ref 8.9–10.3)
Chloride: 105 mmol/L (ref 98–111)
Creatinine, Ser: 0.75 mg/dL (ref 0.44–1.00)
GFR, Estimated: >60 mL/min (ref >60)
Glucose, Bld: 92 mg/dL (ref 70–99)
Potassium: 3.4 mmol/L — ABNORMAL LOW (ref 3.5–5.1)
Sodium: 137 mmol/L (ref 135–145)
Total Bilirubin: 0.4 mg/dL (ref 0.3–1.2)
Total Protein: 5.9 g/dL — ABNORMAL LOW (ref 6.5–8.1)

## 2021-08-16 LAB — RESP PANEL BY RT-PCR (FLU A&B, COVID) ARPGX2
Influenza A by PCR: NEGATIVE
Influenza B by PCR: NEGATIVE
SARS Coronavirus 2 by RT PCR: NEGATIVE

## 2021-08-16 LAB — HCG, SERUM, QUALITATIVE: Preg, Serum: NEGATIVE

## 2021-08-16 MED ORDER — FENTANYL CITRATE PF 50 MCG/ML IJ SOSY
100.0000 ug | PREFILLED_SYRINGE | Freq: Once | INTRAMUSCULAR | Status: AC
Start: 2021-08-16 — End: 2021-08-16
  Administered 2021-08-16: 100 ug via INTRAVENOUS
  Filled 2021-08-16: qty 2

## 2021-08-16 MED ORDER — FENTANYL CITRATE PF 50 MCG/ML IJ SOSY
PREFILLED_SYRINGE | INTRAMUSCULAR | Status: AC
  Filled 2021-08-16: qty 1

## 2021-08-16 MED ORDER — SUCRALFATE 1 GM/10ML PO SUSP: 1.0000 g | Freq: Three times a day (TID) | ORAL | 0 refills | Status: DC

## 2021-08-16 MED ORDER — IOHEXOL 300 MG/ML  SOLN
100.0000 mL | Freq: Once | INTRAMUSCULAR | Status: AC | PRN
  Administered 2021-08-16: 100 mL via INTRAVENOUS

## 2021-08-16 MED ORDER — OXYCODONE HCL 5 MG PO TABS: 5.0000 mg | ORAL_TABLET | Freq: Four times a day (QID) | ORAL | 0 refills | Status: DC | PRN

## 2021-08-16 MED ORDER — DICYCLOMINE HCL 20 MG PO TABS: 20.0000 mg | ORAL_TABLET | Freq: Three times a day (TID) | ORAL | 0 refills | Status: AC

## 2021-08-16 MED ORDER — ONDANSETRON HCL 4 MG/2ML IJ SOLN
4.0000 mg | Freq: Once | INTRAMUSCULAR | Status: AC
  Administered 2021-08-16: 4 mg via INTRAVENOUS
  Filled 2021-08-16: qty 2

## 2021-08-16 MED ORDER — SODIUM CHLORIDE 0.9 % IV SOLN: INTRAVENOUS | Status: DC

## 2021-08-16 MED ORDER — FENTANYL CITRATE PF 50 MCG/ML IJ SOSY
50.0000 ug | PREFILLED_SYRINGE | Freq: Once | INTRAMUSCULAR | Status: AC
Start: 2021-08-16 — End: 2021-08-16
  Administered 2021-08-16: 50 ug via INTRAVENOUS

## 2021-08-16 NOTE — ED Notes (Signed)
Pt verbalizes understanding of discharge instructions. Opportunity for questioning and answers were provided. Armand removed by staff, pt discharged from ED to home. Educated to pick up Rx and f/u with Psychologist, sport and exercise.

## 2021-08-16 NOTE — ED Triage Notes (Signed)
  Patient comes in with abdominal pain that started Friday afternoon.  Patient states she had hiatal hernia repair on 10/10.  Patient states she had scar tissue removal and stomach relocated. Pain 9/10, sharp/tender in LLQ.  Patient states it "feels like something is tearing".

## 2021-08-16 NOTE — ED Notes (Signed)
Patient transported to CT 

## 2021-08-16 NOTE — ED Provider Notes (Signed)
[facility] EMERGENCY DEPT Provider Note   CSN: [id number] Arrival date & time: 08/16/21  0302     History Chief Complaint  Patient presents with  . Abdominal Pain    Jordan Russell is a 50 y.o. female.  The history is provided by the patient.  Abdominal Pain Pain location:  Generalized (but worst in the LLQ) Pain quality: sharp   Pain radiates to:  Does not radiate Pain severity:  Severe Onset quality:  Gradual Duration:  3 days Timing:  Constant Progression:  Unchanged Chronicity:  New Context: previous surgery   Context comment:  Hiatal hernia repair on 08/11/2021 by Dr. Redmond Pulling Relieved by:  Nothing Associated symptoms: diarrhea   Associated symptoms: no anorexia, no constipation, no dysuria, no fever, no nausea, no shortness of breath and no vomiting   Associated symptoms comment:  Diarrhea x 1 day  Risk factors: recent hospitalization   Patient with IBS, Fibromyalgia and previous gastric bypass presents on post op day 5 from hiatal hernia repair by Dr. Redmond Pulling.  No f/c/r.  No urinary symptoms complaints of generalized abdominal pain worst in the LLQ.  No urinary symptoms.      Past Medical History:  Diagnosis Date  . Asthma   . Bipolar 1 disorder ([place])   . Chronic kidney disease   . Depression   . Fibromyalgia   . GAD (generalized anxiety disorder)   . Gastroparesis   . GERD (gastroesophageal reflux disease)   . Hiatal hernia   . History of kidney stones   . History of primary hyperparathyroidism    s/p  bilateral inferior parathyroidectomy 08-03-2010  . Hypotension   . Hypothyroidism   . IBS (irritable bowel syndrome)   . Left ureteral stone   . Migraine   . Pneumonia   . PONV (postoperative nausea and vomiting)    and claustrophobic with mask  . RA (rheumatoid arthritis) ([place])    dr Gavin Pound-- rheumtologist  . Self mutilating behavior   . Thyroid disease   . Urgency of urination   . Wears glasses     Patient Active  Problem List   Diagnosis Date Noted  . History of repair of hiatal hernia 08/10/2021  . Sinus bradycardia 06/29/2021  . Hypotension 06/29/2021  . Wears glasses 06/25/2021  . Urgency of urination 06/25/2021  . Thyroid disease 06/25/2021  . Self mutilating behavior 06/25/2021  . PONV (postoperative nausea and vomiting) 06/25/2021  . Left ureteral stone 06/25/2021  . History of kidney stones 06/25/2021  . Hiatal hernia 06/25/2021  . Gastroparesis 06/25/2021  . GAD (generalized anxiety disorder) 06/25/2021  . Morbid obesity ([place]) 05/22/2020  . Cigarette nicotine dependence 05/22/2020  . Microhematuria 01/04/2019  . Delirium due to multiple etiologies 06/11/2018  . Colitis 06/10/2018  . Sepsis due to Escherichia coli ([place]) 06/10/2018  . Jaundice 06/10/2018  . Hypoalbuminemia 06/10/2018  . HCAP (healthcare-associated pneumonia) 06/09/2018  . Encephalopathy, hepatic   . Acute liver failure without hepatic coma   . E coli bacteremia   . Preop cardiovascular exam   . Severe malnutrition ([place])   . Drug-induced liver injury 06/08/2018  . Elevated LFTs 06/08/2018  . Hyperammonemia ([place]) 06/08/2018  . Anasarca 06/08/2018  . Acute encephalopathy 06/05/2018  . Hypoxia 06/05/2018  . Hyperbilirubinemia 06/05/2018  . Fatty infiltration of liver 06/05/2018  . AKI (acute kidney injury) ([place]) 06/05/2018  . Protein-calorie malnutrition, severe 06/05/2018  . Severe sepsis ([place]) 06/04/2018  . Trochanteric bursitis, left hip 09/08/2017  . Pain  in left hip 09/08/2017  . Carpal tunnel syndrome of left wrist 12/17/2016  . Dysphagia, pharyngoesophageal phase 12/17/2016  . Calculus of kidney 12/17/2016  . Hypothyroidism 12/17/2016  . Hydronephrosis with renal and ureteral calculus obstruction 12/17/2016  . Hydradenitis 12/17/2016  . HPTH (hyperparathyroidism) ([place]) 12/17/2016  . Nocturia 12/17/2016  . Intractable migraine with aura without status migrainosus 12/17/2016  . Pain in left knee  12/17/2016  . Arthralgia of multiple joints 12/17/2016  . Avitaminosis D 12/17/2016  . Anemia, iron deficiency 12/17/2016  . Cutaneous eruption 12/17/2016  . Pain in joint 12/17/2016  . Varicose veins of bilateral lower extremities with pain 06/28/2016  . Adverse effects of medication 03/10/2016  . Migraine 03/10/2016  . IBS (irritable bowel syndrome) 03/10/2016  . Anxiety 03/10/2016  . GERD (gastroesophageal reflux disease) 03/10/2016  . Epigastric pain 06/04/2015  . Pharyngoesophageal dysphagia 02/10/2015  . Bipolar disorder, unspecified ([place]) 08/03/2014  . Rheumatoid arthritis ([place]) 08/03/2014  . Fibromyalgia 08/03/2014  . History of nutritional disorder 08/03/2014  . Hidradenitis 08/03/2014  . History of iron deficiency anemia 08/03/2014  . Chondromalacia 02/26/2014  . Bilateral knee pain 02/05/2014  . Knee pain 02/05/2014  . Vitamin D deficiency 12/14/2013  . Status post bariatric surgery 12/14/2013  . History of gastric bypass 12/14/2013  . Postoperative state 12/14/2013    Past Surgical History:  Procedure Laterality Date  . ABDOMINAL HYSTERECTOMY    . BALLOON DILATION N/A 10/01/2014   Procedure: BALLOON DILATION;  Surgeon: Garlan Fair, MD;  Location: [facility];  Service: Endoscopy;  Laterality: N/A;  . BIOPSY  03/05/2021   Procedure: BIOPSY;  Surgeon: Ronnette Juniper, MD;  Location: [facility];  Service: Gastroenterology;;  . CYSTO/  BILATERAL RETROGRADE PYELOGRAM  11/20/2000  . CYSTO/  RIGHT RETROGRADE PYELOGRAM/  RIGHT URETEROSCOPY/  STENT PLACEMENT  12/16/2006  . CYSTOSCOPY/URETEROSCOPY/HOLMIUM LASER/STENT PLACEMENT Left 11/09/2016   Procedure: CYSTOSCOPY/URETEROSCOPY/HOLMIUM LASER/STENT PLACEMENT;  Surgeon: Nickie Retort, MD;  Location: [facility];  Service: Urology;  Laterality: Left;  90 MINS  [phone]   . ESOPHAGEAL MANOMETRY N/A 12/23/2014   Procedure: ESOPHAGEAL MANOMETRY (EM);  Surgeon: Garlan Fair, MD;  Location: [facility];  Service: Endoscopy;  Laterality: N/A;  . ESOPHAGOGASTRODUODENOSCOPY  last one 03-28-2015  . ESOPHAGOGASTRODUODENOSCOPY (EGD) WITH PROPOFOL N/A 10/01/2014   Procedure: ESOPHAGOGASTRODUODENOSCOPY (EGD) WITH PROPOFOL;  Surgeon: Garlan Fair, MD;  Location: [facility];  Service: Endoscopy;  Laterality: N/A;  . ESOPHAGOGASTRODUODENOSCOPY (EGD) WITH PROPOFOL N/A 03/05/2021   Procedure: ESOPHAGOGASTRODUODENOSCOPY (EGD) WITH PROPOFOL;  Surgeon: Ronnette Juniper, MD;  Location: [facility];  Service: Gastroenterology;  Laterality: N/A;  . EXTRACORPOREAL SHOCK WAVE LITHOTRIPSY  multiple times since age 86  . HOLMIUM LASER APPLICATION Left 03/05/6567   Procedure: HOLMIUM LASER APPLICATION;  Surgeon: Nickie Retort, MD;  Location: [facility];  Service: Urology;  Laterality: Left;  . KNEE ARTHROSCOPY Right 03-04-2014  [facility]   w/ Arthrotomy  . LAPAROSCOPIC ASSISTED VAGINAL HYSTERECTOMY  12/28/2005  . LAPAROSCOPIC CHOLECYSTECTOMY  01/17/2004  . LAPAROSCOPY LEFT OVARIAN CYSTECTOMY/  BILATERAL TUBAL LIGATION  02/26/2003  . LEFT URETEROSCOPIC STONE EXTRACTION /  STENT PLACEMENT  07/09/2002  . NECK EXPLORATION/  BILATERAL INFERIOR PARATHYROIDECTOMY  08/03/2010  . RIGHT URETERAL DILATION/  URETEROSCOPIC STONE EXTRACTION  06/12/2010  . ROUX-EN-Y GASTRIC BYPASS  2001  . SOLYX TRANSURETHRAL SLING/  POSTERIOR PELVIC FLOOR SACROSPINOUS REPAIR  02/21/2009   and Cysto/  Bilateral ureteral stent placement  . TONSILLECTOMY AND ADENOIDECTOMY    .  UPPER GI ENDOSCOPY N/A 08/10/2021   Procedure: UPPER GI ENDOSCOPY;  Surgeon: Greer Pickerel, MD;  Location: [facility];  Service: General;  Laterality: N/A;  . WRIST GANGLION EXCISION Right 01/28/2000  . XI ROBOTIC ASSISTED HIATAL HERNIA REPAIR N/A 08/10/2021   Procedure: XI ROBOTIC ASSISTED HIATAL HERNIA REPAIR WITH MESH, ROBOTIC LYSIS OF ADHESIONS;  Surgeon: Greer Pickerel, MD;  Location: [facility];  Service: General;  Laterality: N/A;     OB History    No obstetric history on file.     Family History  Problem Relation Age of Onset  . Bipolar disorder Son   . Breast cancer Mother   . Breast cancer Sister   . Anxiety disorder Brother   . Depression Brother   . Breast cancer Maternal Aunt   . Breast cancer Maternal Grandmother     Social History   Tobacco Use  . Smoking status: Every Day    Packs/day: 0.25    Years: 20.00    Pack years: 5.00    Types: Cigarettes  . Smokeless tobacco: Never  Vaping Use  . Vaping Use: Never used  Substance Use Topics  . Alcohol use: Yes    Comment: occ  . Drug use: No    Home Medications Prior to Admission medications   Medication Sig Start Date End Date Taking? Authorizing Provider  oxyCODONE (ROXICODONE) 5 MG immediate release tablet Take 1 tablet (5 mg total) by mouth every 6 (six) hours as needed for severe pain. 08/16/21  Yes Eryk Beavers, MD  sucralfate (CARAFATE) 1 GM/10ML suspension Take 10 mLs (1 g total) by mouth 4 (four) times daily -  with meals and at bedtime. 08/16/21  Yes Alann Avey, MD  ALPRAZolam Duanne Moron) 0.5 MG tablet Take 0.5 mg by mouth 3 (three) times daily as needed for anxiety. 11/19/18   [provider]  Carboxymeth-Glyc-Polysorb PF (REFRESH OPTIVE MEGA-3) 0.5-1-0.5 % SOLN Place 1 drop into both eyes 3 (three) times daily as needed (dry/irritated eyes.).    [provider]  dicyclomine (BENTYL) 20 MG tablet Take 1 tablet (20 mg total) by mouth 3 (three) times daily before meals. 08/16/21   Hadley Detloff, MD  escitalopram (LEXAPRO) 20 MG tablet Take 20 mg by mouth in the morning. 02/10/21   [provider]  esomeprazole (NEXIUM) 40 MG capsule Take 40 mg by mouth 2 (two) times daily. 02/10/21   [provider]  folic acid (FOLVITE) 1 MG tablet Take 1 mg by mouth in the morning. 02/04/21   [provider]  furosemide (LASIX) 20 MG tablet Take 20 mg by mouth in the morning. 02/08/19   [provider]  lactulose  (CHRONULAC) 10 GM/15ML solution Place 45 mLs (30 g total) into feeding tube every 6 (six) hours. Patient taking differently: Place 30 g into feeding tube daily as needed for mild constipation, moderate constipation or severe constipation (constipation). 06/09/18   Shelly Coss, MD  levothyroxine (SYNTHROID) 88 MCG tablet Take 88 mcg by mouth daily before breakfast. 12/19/20   [provider]  LINZESS 290 MCG CAPS capsule Take 290 mcg by mouth daily before breakfast. 12/01/20   [provider]  lithium 300 MG tablet Take 300 mg by mouth 2 (two) times daily. 02/01/19   [provider]  ondansetron (ZOFRAN-ODT) 8 MG disintegrating tablet Take 1 tablet (8 mg total) by mouth every 6 (six) hours as needed for nausea. 08/11/21   Greer Pickerel, MD  oxyCODONE (OXY IR/ROXICODONE) 5 MG immediate release tablet  Take 1 tablet (5 mg total) by mouth every 6 (six) hours as needed for severe pain or breakthrough pain. 08/11/21   Meuth, Blaine Hamper, PA-C  rifaximin (XIFAXAN) 550 MG TABS tablet Take 550 mg by mouth 2 (two) times daily.    [provider]  rizatriptan (MAXALT) 10 MG tablet Take 10 mg by mouth every 2 (two) hours as needed for migraine. 11/10/20   [provider]  spironolactone (ALDACTONE) 50 MG tablet Take 50 mg by mouth in the morning. 06/23/18   [provider]  topiramate (TOPAMAX) 25 MG tablet Take 25 mg by mouth 2 (two) times daily. 02/20/21   [provider]  traZODone (DESYREL) 100 MG tablet Take 200 mg by mouth at bedtime. 02/11/21   [provider]    Allergies    Gabapentin, Sulfasalazine, Lamictal [lamotrigine], Penicillins, Sulfa antibiotics, Tylenol [acetaminophen], and Morphine and related  Review of Systems   Review of Systems  Constitutional:  Negative for fever.  HENT:  Negative for facial swelling.   Eyes:  Negative for redness.  Respiratory:  Negative for shortness of breath and wheezing.   Gastrointestinal:   Positive for abdominal pain and diarrhea. Negative for anorexia, constipation, nausea and vomiting.  Genitourinary:  Negative for dysuria.  Musculoskeletal:  Negative for neck stiffness.  Skin:  Negative for rash.  Neurological:  Negative for facial asymmetry.  Psychiatric/Behavioral:  Negative for agitation.   All other systems reviewed and are negative.  Physical Exam Updated Vital Signs BP 104/64   Pulse 60   Temp 98.2 F (36.8 C) (Oral)   Resp 18   Ht 5\' 6"  (1.676 m)   Wt 77.6 kg   SpO2 100%   BMI 27.60 kg/m   Physical Exam Vitals and nursing note reviewed. Exam conducted with a chaperone present.  Constitutional:      Appearance: Normal appearance. She is not ill-appearing or diaphoretic.  HENT:     Head: Normocephalic and atraumatic.     Nose: Nose normal.  Eyes:     Conjunctiva/sclera: Conjunctivae normal.     Pupils: Pupils are equal, round, and reactive to light.  Cardiovascular:     Rate and Rhythm: Normal rate and regular rhythm.     Pulses: Normal pulses.     Heart sounds: Normal heart sounds.  Pulmonary:     Effort: Pulmonary effort is normal.     Breath sounds: Normal breath sounds.  Abdominal:     General: Abdomen is flat. Bowel sounds are normal.     Palpations: Abdomen is soft.     Tenderness: There is no guarding or rebound.    Musculoskeletal:        General: Normal range of motion.     Cervical back: Normal range of motion and neck supple.  Skin:    General: Skin is warm and dry.     Capillary Refill: Capillary refill takes less than 2 seconds.  Neurological:     General: No focal deficit present.     Mental Status: She is alert and oriented to person, place, and time.  Psychiatric:        Mood and Affect: Mood normal.        Behavior: Behavior normal.    ED Results / Procedures / Treatments   Labs (all labs ordered are listed, but only abnormal results are displayed) Labs Reviewed  CBC WITH DIFFERENTIAL/PLATELET - Abnormal; Notable  for the following components:      Result Value  Abs Immature Granulocytes 0.08 (*)    All other components within normal limits  COMPREHENSIVE METABOLIC PANEL - Abnormal; Notable for the following components:   Potassium 3.4 (*)    Total Protein 5.9 (*)    AST 12 (*)    Alkaline Phosphatase 141 (*)    All other components within normal limits  RESP PANEL BY RT-PCR (FLU A&B, COVID) ARPGX2  HCG, SERUM, QUALITATIVE  URINALYSIS, ROUTINE W REFLEX MICROSCOPIC    EKG None  Radiology CT ABDOMEN PELVIS W CONTRAST  Result Date: 08/16/2021 CLINICAL DATA:  Acute abdominal pain.  Hernia repair 08/10/21 EXAM: CT ABDOMEN AND PELVIS WITH CONTRAST TECHNIQUE: Multidetector CT imaging of the abdomen and pelvis was performed using the standard protocol following bolus administration of intravenous contrast. CONTRAST:  146mL OMNIPAQUE IOHEXOL 300 MG/ML  SOLN COMPARISON:  05/25/2021 FINDINGS: Lower chest: Gastric bypass with recent hiatal hernia repair associated edematous GE junction and adjacent fat. No collection noted. The neo stomach is below the diaphragm. No pneumomediastinum. Hepatobiliary: No focal liver abnormality.Cholecystectomy Pancreas: Unremarkable. Spleen: Unremarkable. Adrenals/Urinary Tract: Negative adrenals. No hydronephrosis or stone. Unremarkable bladder. Stomach/Bowel: Gastric bypass and hiatal hernia repair as above. Bowel obstruction Vascular/Lymphatic: No acute vascular abnormality. No mass or adenopathy. Reproductive:No pathologic findings. Other: No ascites or pneumoperitoneum. Musculoskeletal: No acute abnormalities. IMPRESSION: Edematous GE junction and regional fat after recent hiatal hernia repair. No abscess or evidence of perforation. Electronically Signed   By: Jorje Guild M.D.   On: 08/16/2021 04:46    Procedures Procedures   Medications Ordered in ED Medications  0.9 %  sodium chloride infusion (0 mLs Intravenous Stopped 08/16/21 0454)  fentaNYL (SUBLIMAZE)  injection 50 mcg (has no administration in time range)  ondansetron (ZOFRAN) injection 4 mg (4 mg Intravenous Given 08/16/21 0332)  fentaNYL (SUBLIMAZE) injection 100 mcg (100 mcg Intravenous Given 08/16/21 0332)  iohexol (OMNIPAQUE) 300 MG/ML solution 100 mL (100 mLs Intravenous Contrast Given 08/16/21 0411)    ED Course  I have reviewed the triage vital signs and the nursing notes.  Pertinent labs & imaging results that were available during my care of the patient were reviewed by me and considered in my medical decision making (see chart for details).   445 case d/w Dr. Kieth Brightly who assisted in the procedure, no indication for admission. Expected findings on POD 5 on CT scan.   Please give a few more pain medications and have patient follow up.  No additional interventions or medications at this time.    Labs and imaging discussed with patient as well as follow up plan.  Stable for discharge with close follow up.  Call Dr. Redmond Pulling on Monday.     Jordan Russell was evaluated in Emergency Department on 08/16/2021 for the symptoms described in the history of present illness. She was evaluated in the context of the global COVID-19 pandemic, which necessitated consideration that the patient might be at risk for infection with the SARS-CoV-2 virus that causes COVID-19. Institutional protocols and algorithms that pertain to the evaluation of patients at risk for COVID-19 are in a state of rapid change based on information released by regulatory bodies including the CDC and federal and state organizations. These policies and algorithms were followed during the patient's care in the ED.   Final Clinical Impression(s) / ED Diagnoses Final diagnoses:  Diarrhea, unspecified type  Post-operative pain  Ecchymosis   Return for intractable cough, coughing up blood, fevers > 100.4 unrelieved by medication, shortness of breath, intractable vomiting, chest  pain, shortness of breath, weakness,  numbness, changes in speech, facial asymmetry, abdominal pain, passing out, Inability to tolerate liquids or food, cough, altered mental status or any concerns. No signs of systemic illness or infection. The patient is nontoxic-appearing on exam and vital signs are within normal limits.  I have reviewed the triage vital signs and the nursing notes. Pertinent labs & imaging results that were available during my care of the patient were reviewed by me and considered in my medical decision making (see chart for details). After history, exam, and medical workup I feel the patient has been appropriately medically screened and is safe for discharge home. Pertinent diagnoses were discussed with the patient. Patient was given return precautions. Rx / DC Orders ED Discharge Orders          Ordered    dicyclomine (BENTYL) 20 MG tablet  3 times daily before meals        08/16/21 0459    sucralfate (CARAFATE) 1 GM/10ML suspension  3 times daily with meals & bedtime        08/16/21 0459    oxyCODONE (ROXICODONE) 5 MG immediate release tablet  Every 6 hours PRN        08/16/21 [place], Devany Aja, MD 08/16/21 2500

## 2021-08-28 ENCOUNTER — Other Ambulatory Visit: Payer: BC Managed Care – PPO

## 2021-08-31 DIAGNOSIS — R1032 Left lower quadrant pain: Secondary | ICD-10-CM | POA: Diagnosis not present

## 2021-08-31 DIAGNOSIS — K6289 Other specified diseases of anus and rectum: Secondary | ICD-10-CM | POA: Diagnosis not present

## 2021-08-31 DIAGNOSIS — L600 Ingrowing nail: Secondary | ICD-10-CM | POA: Diagnosis not present

## 2021-09-07 DIAGNOSIS — F3181 Bipolar II disorder: Secondary | ICD-10-CM | POA: Diagnosis not present

## 2021-09-14 ENCOUNTER — Ambulatory Visit
Admission: RE | Admit: 2021-09-14 | Discharge: 2021-09-14 | Disposition: A | Discharge status: Home / Self Care | Payer: BC Managed Care – PPO | Source: Ambulatory Visit | Attending: Neurology | Admitting: Neurology

## 2021-09-14 DIAGNOSIS — E639 Nutritional deficiency, unspecified: Secondary | ICD-10-CM

## 2021-09-14 DIAGNOSIS — R251 Tremor, unspecified: Secondary | ICD-10-CM | POA: Diagnosis not present

## 2021-09-14 DIAGNOSIS — R296 Repeated falls: Secondary | ICD-10-CM | POA: Diagnosis not present

## 2021-09-14 DIAGNOSIS — R413 Other amnesia: Secondary | ICD-10-CM | POA: Diagnosis not present

## 2021-09-14 DIAGNOSIS — G319 Degenerative disease of nervous system, unspecified: Secondary | ICD-10-CM | POA: Diagnosis not present

## 2021-09-21 ENCOUNTER — Telehealth: Payer: Self-pay | Admitting: Cardiology

## 2021-09-21 NOTE — Telephone Encounter (Signed)
Pt c/o of Chest Pain: STAT if CP now or developed within 24 hours  1. Are you having CP right now? no  2. Are you experiencing any other symptoms (ex. SOB, nausea, vomiting, sweating)? Jaw pain, sweating, nausea  3. How long have you been experiencing CP? About a month, but got worse the other night  4. Is your CP continuous or coming and going? Comes and goes  5. Have you taken Nitroglycerin? no   Patient states she has had on and off chest pain for about a month. She states it was worse the other night and says the pain went across her back and in her jaw. She says she felt like her teeth would explode.  ?

## 2021-09-22 NOTE — Telephone Encounter (Signed)
Left message for patient to return call.

## 2021-09-22 NOTE — Telephone Encounter (Signed)
Spoke to patient scheduled her to be seen next week.

## 2021-09-22 NOTE — Telephone Encounter (Signed)
Follow Up:     Patient is returning Hayley's call.

## 2021-09-29 ENCOUNTER — Other Ambulatory Visit: Payer: Self-pay

## 2021-09-29 DIAGNOSIS — N189 Chronic kidney disease, unspecified: Secondary | ICD-10-CM | POA: Insufficient documentation

## 2021-09-29 DIAGNOSIS — F329 Major depressive disorder, single episode, unspecified: Secondary | ICD-10-CM | POA: Insufficient documentation

## 2021-09-29 DIAGNOSIS — F32A Depression, unspecified: Secondary | ICD-10-CM | POA: Insufficient documentation

## 2021-10-01 ENCOUNTER — Ambulatory Visit (INDEPENDENT_AMBULATORY_CARE_PROVIDER_SITE_OTHER): Payer: BC Managed Care – PPO | Admitting: Cardiology

## 2021-10-01 ENCOUNTER — Encounter: Payer: Self-pay | Admitting: Cardiology

## 2021-10-01 ENCOUNTER — Other Ambulatory Visit: Payer: Self-pay

## 2021-10-01 VITALS — BP 90/56 | HR 66 | Ht 66.5 in | Wt 171.0 lb

## 2021-10-01 DIAGNOSIS — G629 Polyneuropathy, unspecified: Secondary | ICD-10-CM | POA: Insufficient documentation

## 2021-10-01 DIAGNOSIS — R234 Changes in skin texture: Secondary | ICD-10-CM | POA: Insufficient documentation

## 2021-10-01 DIAGNOSIS — K5904 Chronic idiopathic constipation: Secondary | ICD-10-CM | POA: Insufficient documentation

## 2021-10-01 DIAGNOSIS — I872 Venous insufficiency (chronic) (peripheral): Secondary | ICD-10-CM | POA: Insufficient documentation

## 2021-10-01 DIAGNOSIS — R296 Repeated falls: Secondary | ICD-10-CM | POA: Insufficient documentation

## 2021-10-01 DIAGNOSIS — Z8719 Personal history of other diseases of the digestive system: Secondary | ICD-10-CM | POA: Insufficient documentation

## 2021-10-01 DIAGNOSIS — R0789 Other chest pain: Secondary | ICD-10-CM | POA: Insufficient documentation

## 2021-10-01 DIAGNOSIS — L84 Corns and callosities: Secondary | ICD-10-CM | POA: Insufficient documentation

## 2021-10-01 DIAGNOSIS — G4700 Insomnia, unspecified: Secondary | ICD-10-CM

## 2021-10-01 DIAGNOSIS — H6990 Unspecified Eustachian tube disorder, unspecified ear: Secondary | ICD-10-CM | POA: Insufficient documentation

## 2021-10-01 DIAGNOSIS — M76899 Other specified enthesopathies of unspecified lower limb, excluding foot: Secondary | ICD-10-CM

## 2021-10-01 DIAGNOSIS — R270 Ataxia, unspecified: Secondary | ICD-10-CM | POA: Insufficient documentation

## 2021-10-01 DIAGNOSIS — L299 Pruritus, unspecified: Secondary | ICD-10-CM

## 2021-10-01 DIAGNOSIS — E538 Deficiency of other specified B group vitamins: Secondary | ICD-10-CM

## 2021-10-01 DIAGNOSIS — S50819A Abrasion of unspecified forearm, initial encounter: Secondary | ICD-10-CM | POA: Insufficient documentation

## 2021-10-01 DIAGNOSIS — L259 Unspecified contact dermatitis, unspecified cause: Secondary | ICD-10-CM

## 2021-10-01 DIAGNOSIS — T148XXA Other injury of unspecified body region, initial encounter: Secondary | ICD-10-CM | POA: Insufficient documentation

## 2021-10-01 DIAGNOSIS — R251 Tremor, unspecified: Secondary | ICD-10-CM | POA: Insufficient documentation

## 2021-10-01 DIAGNOSIS — R259 Unspecified abnormal involuntary movements: Secondary | ICD-10-CM

## 2021-10-01 DIAGNOSIS — R5383 Other fatigue: Secondary | ICD-10-CM

## 2021-10-01 DIAGNOSIS — R001 Bradycardia, unspecified: Secondary | ICD-10-CM

## 2021-10-01 DIAGNOSIS — I950 Idiopathic hypotension: Secondary | ICD-10-CM

## 2021-10-01 DIAGNOSIS — K649 Unspecified hemorrhoids: Secondary | ICD-10-CM

## 2021-10-01 DIAGNOSIS — K831 Obstruction of bile duct: Secondary | ICD-10-CM

## 2021-10-01 DIAGNOSIS — R1313 Dysphagia, pharyngeal phase: Secondary | ICD-10-CM | POA: Insufficient documentation

## 2021-10-01 DIAGNOSIS — R252 Cramp and spasm: Secondary | ICD-10-CM | POA: Insufficient documentation

## 2021-10-01 DIAGNOSIS — R413 Other amnesia: Secondary | ICD-10-CM | POA: Insufficient documentation

## 2021-10-01 DIAGNOSIS — E785 Hyperlipidemia, unspecified: Secondary | ICD-10-CM | POA: Insufficient documentation

## 2021-10-01 DIAGNOSIS — R600 Localized edema: Secondary | ICD-10-CM

## 2021-10-01 DIAGNOSIS — N762 Acute vulvitis: Secondary | ICD-10-CM | POA: Insufficient documentation

## 2021-10-01 DIAGNOSIS — R203 Hyperesthesia: Secondary | ICD-10-CM

## 2021-10-01 DIAGNOSIS — R946 Abnormal results of thyroid function studies: Secondary | ICD-10-CM | POA: Insufficient documentation

## 2021-10-01 DIAGNOSIS — L659 Nonscarring hair loss, unspecified: Secondary | ICD-10-CM | POA: Insufficient documentation

## 2021-10-01 DIAGNOSIS — J310 Chronic rhinitis: Secondary | ICD-10-CM | POA: Insufficient documentation

## 2021-10-01 DIAGNOSIS — R410 Disorientation, unspecified: Secondary | ICD-10-CM | POA: Insufficient documentation

## 2021-10-01 HISTORY — DX: Other specified enthesopathies of unspecified lower limb, excluding foot: M76.899

## 2021-10-01 HISTORY — DX: Pruritus, unspecified: L29.9

## 2021-10-01 HISTORY — DX: Insomnia, unspecified: G47.00

## 2021-10-01 HISTORY — DX: Acute vulvitis: N76.2

## 2021-10-01 HISTORY — DX: Hyperlipidemia, unspecified: E78.5

## 2021-10-01 HISTORY — DX: Chronic idiopathic constipation: K59.04

## 2021-10-01 HISTORY — DX: Obstruction of bile duct: K83.1

## 2021-10-01 HISTORY — DX: Repeated falls: R29.6

## 2021-10-01 HISTORY — DX: Unspecified eustachian tube disorder, unspecified ear: H69.90

## 2021-10-01 HISTORY — DX: Other fatigue: R53.83

## 2021-10-01 HISTORY — DX: Venous insufficiency (chronic) (peripheral): I87.2

## 2021-10-01 HISTORY — DX: Chronic rhinitis: J31.0

## 2021-10-01 HISTORY — DX: Unspecified hemorrhoids: K64.9

## 2021-10-01 HISTORY — DX: Localized edema: R60.0

## 2021-10-01 HISTORY — DX: Hyperesthesia: R20.3

## 2021-10-01 HISTORY — DX: Nonscarring hair loss, unspecified: L65.9

## 2021-10-01 HISTORY — DX: Other chest pain: R07.89

## 2021-10-01 HISTORY — DX: Unspecified abnormal involuntary movements: R25.9

## 2021-10-01 HISTORY — DX: Deficiency of other specified B group vitamins: E53.8

## 2021-10-01 HISTORY — DX: Polyneuropathy, unspecified: G62.9

## 2021-10-01 HISTORY — DX: Dysphagia, pharyngeal phase: R13.13

## 2021-10-01 HISTORY — DX: Unspecified contact dermatitis, unspecified cause: L25.9

## 2021-10-01 HISTORY — DX: Ataxia, unspecified: R27.0

## 2021-10-01 HISTORY — DX: Corns and callosities: L84

## 2021-10-01 NOTE — Patient Instructions (Signed)
Medication Instructions:  Your physician recommends that you continue on your current medications as directed. Please refer to the Current Medication list given to you today. *If you need a refill on your cardiac medications before your next appointment, please call your pharmacy*   Lab Work: None If you have labs (blood work) drawn today and your tests are completely normal, you will receive your results only by: [place] (if you have MyChart) OR A paper copy in the mail If you have any lab test that is abnormal or we need to change your treatment, we will call you to review the results.   Testing/Procedures:   Your cardiac CT will be scheduled at one of the below locations:   [facility] [address] [place], [place] [address] [phone]  [place] [address] [place], [place] [address] [phone]  If scheduled at [facility], please arrive at the [facility] main entrance (entrance A) of [facility] 30 minutes prior to test start time. You can use the FREE valet parking offered at the main entrance (encouraged to control the heart rate for the test) Proceed to the [facility] Radiology Department (first floor) to check-in and test prep.  If scheduled at [facility], please arrive 15 mins early for check-in and test prep.  Please follow these instructions carefully (unless otherwise directed):   On the Night Before the Test: Be sure to Drink plenty of water. Do not consume any caffeinated/decaffeinated beverages or chocolate 12 hours prior to your test. Do not take any antihistamines 12 hours prior to your test.  On the Day of the Test: Drink plenty of water until 1 hour prior to the test. Do not eat any food 4 hours prior to the test. You may take your regular medications prior to the test.  HOLD spironolactone the morning of the test  HOLD  Furosemide morning of the test. FEMALES- please wear underwire-free bra if available, avoid dresses & tight clothing        After the Test: Drink plenty of water. After receiving IV contrast, you may experience a mild flushed feeling. This is normal. On occasion, you may experience a mild rash up to 24 hours after the test. This is not dangerous. If this occurs, you can take Benadryl 25 mg and increase your fluid intake. If you experience trouble breathing, this can be serious. If it is severe call 911 IMMEDIATELY. If it is mild, please call our office. If you take any of these medications: Glipizide/Metformin, Avandament, Glucavance, please do not take 48 hours after completing test unless otherwise instructed.  Please allow 2-4 weeks for scheduling of routine cardiac CTs. Some insurance companies require a pre-authorization which may delay scheduling of this test.   For non-scheduling related questions, please contact the cardiac imaging nurse navigator should you have any questions/concerns: Marchia Bond, Cardiac Imaging Nurse Navigator Gordy Clement, Cardiac Imaging Nurse Navigator [facility] Direct Office Dial: [phone]   For scheduling needs, including cancellations and rescheduling, please call [place], [phone].    Follow-Up: At [facility], you and your health needs are our priority.  As part of our continuing mission to provide you with exceptional heart care, we have created designated Provider Care Teams.  These Care Teams include your primary Cardiologist (physician) and Advanced Practice Providers (APPs -  Physician Assistants and Nurse Practitioners) who all work together to provide you  with the care you need, when you need it.  We recommend signing up for the patient portal called "MyChart".  Sign up information is provided on this After Visit Summary.  MyChart is used to connect with patients for Virtual Visits (Telemedicine).   Patients are able to view lab/test results, encounter notes, upcoming appointments, etc.  Non-urgent messages can be sent to your provider as well.   To learn more about what you can do with MyChart, go to [email or web address].    Your next appointment:   5 month(s)  The format for your next appointment:   In Person  Provider:   Jenne Campus, MD    Other Instructions

## 2021-10-01 NOTE — Progress Notes (Signed)
Cardiology Office Note:    Date:  10/01/2021   ID:  Jordan Russell, DOB 27-Apr-1971, MRN [id number]  PCP:  Josetta Huddle, MD  Cardiologist:  Jenne Campus, MD    Referring MD: Josetta Huddle, MD   Chief Complaint  Patient presents with   Chest Pain    History of Present Illness:    Jordan Russell is a 50 y.o. female with complex past medical history.  She does have history of gastric bypass surgery, recently she had a mesh repair of her large hiatal hernia, does have history of bradycardia as well as idiopathic hypotension.  She was evaluated before surgery.  Stress testing which was negative.  However she still complain of having a lot of chest pain.  Pain happen with exercise but also happen at rest lasts up to 5 minutes.  She feels like she have an elephant sitting on her chest.  Walking can make it worse but not always. She did have surgery for her hiatal hernia which apparently was very complicated and lengthy she had a lot of adhesions that being fixed.  Past Medical History:  Diagnosis Date   Asthma    Bipolar 1 disorder ([place])    Chronic kidney disease    Depression    Fibromyalgia    GAD (generalized anxiety disorder)    Gastroparesis    GERD (gastroesophageal reflux disease)    Hiatal hernia    History of kidney stones    History of primary hyperparathyroidism    s/p  bilateral inferior parathyroidectomy 08-03-2010   Hypotension    Hypothyroidism    IBS (irritable bowel syndrome)    Left ureteral stone    Migraine    Pneumonia    PONV (postoperative nausea and vomiting)    and claustrophobic with mask   RA (rheumatoid arthritis) ([place])    dr Gavin Pound-- rheumtologist   Self mutilating behavior    Thyroid disease    Urgency of urination    Wears glasses     Past Surgical History:  Procedure Laterality Date   ABDOMINAL HYSTERECTOMY     BALLOON DILATION N/A 10/01/2014   Procedure: BALLOON DILATION;  Surgeon: Garlan Fair, MD;  Location: [facility];  Service: Endoscopy;  Laterality: N/A;   BIOPSY  03/05/2021   Procedure: BIOPSY;  Surgeon: Ronnette Juniper, MD;  Location: [facility];  Service: Gastroenterology;;   CYSTO/  BILATERAL RETROGRADE PYELOGRAM  11/20/2000   CYSTO/  RIGHT RETROGRADE PYELOGRAM/  RIGHT URETEROSCOPY/  STENT PLACEMENT  12/16/2006   CYSTOSCOPY/URETEROSCOPY/HOLMIUM LASER/STENT PLACEMENT Left 11/09/2016   Procedure: CYSTOSCOPY/URETEROSCOPY/HOLMIUM LASER/STENT PLACEMENT;  Surgeon: Nickie Retort, MD;  Location: [facility];  Service: Urology;  Laterality: Left;  90 MINS  [phone]    ESOPHAGEAL MANOMETRY N/A 12/23/2014   Procedure: ESOPHAGEAL MANOMETRY (EM);  Surgeon: Garlan Fair, MD;  Location: [facility];  Service: Endoscopy;  Laterality: N/A;   ESOPHAGOGASTRODUODENOSCOPY  last one 03-28-2015   ESOPHAGOGASTRODUODENOSCOPY (EGD) WITH PROPOFOL N/A 10/01/2014   Procedure: ESOPHAGOGASTRODUODENOSCOPY (EGD) WITH PROPOFOL;  Surgeon: Garlan Fair, MD;  Location: [facility];  Service: Endoscopy;  Laterality: N/A;   ESOPHAGOGASTRODUODENOSCOPY (EGD) WITH PROPOFOL N/A 03/05/2021   Procedure: ESOPHAGOGASTRODUODENOSCOPY (EGD) WITH PROPOFOL;  Surgeon: Ronnette Juniper, MD;  Location: [facility];  Service: Gastroenterology;  Laterality: N/A;   EXTRACORPOREAL SHOCK WAVE LITHOTRIPSY  multiple times since age 26   HOLMIUM LASER APPLICATION Left 12/09/3149   Procedure: HOLMIUM LASER APPLICATION;  Surgeon: Nickie Retort, MD;  Location: [facility];  Service: Urology;  Laterality: Left;   KNEE ARTHROSCOPY Right 03-04-2014  [facility]   w/ Arthrotomy   LAPAROSCOPIC ASSISTED VAGINAL HYSTERECTOMY  12/28/2005   LAPAROSCOPIC CHOLECYSTECTOMY  01/17/2004   LAPAROSCOPY LEFT OVARIAN CYSTECTOMY/  BILATERAL TUBAL LIGATION  02/26/2003   LEFT URETEROSCOPIC STONE EXTRACTION /  STENT PLACEMENT  07/09/2002   NECK EXPLORATION/  BILATERAL INFERIOR PARATHYROIDECTOMY  08/03/2010   RIGHT URETERAL DILATION/   URETEROSCOPIC STONE EXTRACTION  06/12/2010   ROUX-EN-Y GASTRIC BYPASS  2001   SOLYX TRANSURETHRAL SLING/  POSTERIOR PELVIC FLOOR SACROSPINOUS REPAIR  02/21/2009   and Cysto/  Bilateral ureteral stent placement   TONSILLECTOMY AND ADENOIDECTOMY     UPPER GI ENDOSCOPY N/A 08/10/2021   Procedure: UPPER GI ENDOSCOPY;  Surgeon: Greer Pickerel, MD;  Location: [facility];  Service: General;  Laterality: N/A;   WRIST GANGLION EXCISION Right 01/28/2000   XI ROBOTIC ASSISTED HIATAL HERNIA REPAIR N/A 08/10/2021   Procedure: XI ROBOTIC ASSISTED HIATAL HERNIA REPAIR WITH MESH, ROBOTIC LYSIS OF ADHESIONS;  Surgeon: Greer Pickerel, MD;  Location: [facility];  Service: General;  Laterality: N/A;    Current Medications: Current Meds  Medication Sig   ALPRAZolam (XANAX) 0.5 MG tablet Take 0.5 mg by mouth 3 (three) times daily as needed for anxiety.   Carboxymeth-Glyc-Polysorb PF (REFRESH OPTIVE MEGA-3) 0.5-1-0.5 % SOLN Place 1 drop into both eyes 3 (three) times daily as needed (dry/irritated eyes.).   dicyclomine (BENTYL) 20 MG tablet Take 1 tablet (20 mg total) by mouth 3 (three) times daily before meals.   escitalopram (LEXAPRO) 20 MG tablet Take 20 mg by mouth in the morning.   esomeprazole (NEXIUM) 40 MG capsule Take 40 mg by mouth 2 (two) times daily.   folic acid (FOLVITE) 1 MG tablet Take 1 mg by mouth in the morning.   furosemide (LASIX) 20 MG tablet Take 20 mg by mouth in the morning.   lactulose (CHRONULAC) 10 GM/15ML solution Place 45 mLs (30 g total) into feeding tube every 6 (six) hours. (Patient taking differently: Place 30 g into feeding tube daily as needed for mild constipation, moderate constipation or severe constipation (constipation).)   levothyroxine (SYNTHROID) 88 MCG tablet Take 88 mcg by mouth daily before breakfast.   LINZESS 290 MCG CAPS capsule Take 290 mcg by mouth daily before breakfast.   lithium 300 MG tablet Take 300 mg by mouth 2 (two) times daily.   lurasidone (LATUDA) 20 MG TABS  tablet Take 20 mg by mouth daily.   ondansetron (ZOFRAN-ODT) 8 MG disintegrating tablet Take 1 tablet (8 mg total) by mouth every 6 (six) hours as needed for nausea.   oxyCODONE (OXY IR/ROXICODONE) 5 MG immediate release tablet Take 1 tablet (5 mg total) by mouth every 6 (six) hours as needed for severe pain or breakthrough pain.   QUEtiapine (SEROQUEL) 100 MG tablet Take 100 mg by mouth at bedtime.   rifaximin (XIFAXAN) 550 MG TABS tablet Take 550 mg by mouth 2 (two) times daily.   rizatriptan (MAXALT) 10 MG tablet Take 10 mg by mouth every 2 (two) hours as needed for migraine.   spironolactone (ALDACTONE) 50 MG tablet Take 50 mg by mouth in the morning.   sucralfate (CARAFATE) 1 GM/10ML suspension Take 10 mLs (1 g total) by mouth 4 (four) times daily -  with meals and at bedtime.   topiramate (TOPAMAX) 25 MG tablet Take 25 mg by mouth 2 (two) times daily.   traZODone (DESYREL) 100 MG tablet Take  200 mg by mouth at bedtime.   ziprasidone (GEODON) 80 MG capsule Take 80 mg by mouth 2 (two) times daily.     Allergies:   Gabapentin, Sulfa antibiotics, Sulfasalazine, Lamictal [lamotrigine], Lithium, Penicillins, Tylenol [acetaminophen], Morphine and related, and Sulfur   Social History   Socioeconomic History   Marital status: Married    Spouse name: Not on file   Number of children: 1   Years of education: Not on file   Highest education level: Master's degree (e.g., MA, MS, MEng, MEd, MSW, MBA)  Occupational History   Occupation: disabled   Tobacco Use   Smoking status: Every Day    Packs/day: 0.25    Years: 20.00    Pack years: 5.00    Types: Cigarettes   Smokeless tobacco: Never  Vaping Use   Vaping Use: Never used  Substance and Sexual Activity   Alcohol use: Yes    Comment: occ   Drug use: No   Sexual activity: Not on file  Other Topics Concern   Not on file  Social History Narrative   Right handed   Lives with husband, son and son's best friend in a one story home.   Education: Oceanographer.    Social Determinants of Health   Financial Resource Strain: Not on file  Food Insecurity: Not on file  Transportation Needs: Not on file  Physical Activity: Not on file  Stress: Not on file  Social Connections: Not on file     Family History: The patient's family history includes Anxiety disorder in her brother; Bipolar disorder in her son; Breast cancer in her maternal aunt, maternal grandmother, mother, and sister; Depression in her brother. ROS:   Please see the history of present illness.    All 14 point review of systems negative except as described per history of present illness  EKGs/Labs/Other Studies Reviewed:      Recent Labs: 08/16/2021: ALT 28; BUN 11; Creatinine, Ser 0.75; Hemoglobin 13.3; Platelets 272; Potassium 3.4; Sodium 137  Recent Lipid Panel No results found for: CHOL, TRIG, HDL, CHOLHDL, VLDL, LDLCALC, LDLDIRECT  Physical Exam:    VS:  BP (!) 90/56 (BP Location: Right Arm, Patient Position: Sitting)   Pulse 66   Ht 5' 6.5" (1.689 m)   Wt 171 lb (77.6 kg)   SpO2 98%   BMI 27.19 kg/m     Wt Readings from Last 3 Encounters:  10/01/21 171 lb (77.6 kg)  08/16/21 171 lb (77.6 kg)  08/10/21 171 lb 1.2 oz (77.6 kg)     GEN:  Well nourished, well developed in no acute distress HEENT: Normal NECK: No JVD; No carotid bruits LYMPHATICS: No lymphadenopathy CARDIAC: RRR, no murmurs, no rubs, no gallops RESPIRATORY:  Clear to auscultation without rales, wheezing or rhonchi  ABDOMEN: Soft, non-tender, non-distended MUSCULOSKELETAL:  No edema; No deformity  SKIN: Warm and dry LOWER EXTREMITIES: no swelling NEUROLOGIC:  Alert and oriented x 3 PSYCHIATRIC:  Normal affect   ASSESSMENT:    1. Idiopathic hypotension   2. Atypical chest pain   3. Sinus bradycardia    PLAN:    In order of problems listed above:  Chest pain which is still ongoing.  On top of that she does have hypotension.  She does have some risk factors for having  coronary artery disease, stress test has been done which is negative.  I suspect pain is most likely related to postsurgical week and simply healing but some characteristic of the pain make me very  worried.  We did talk about in length what else can be done about answering the question if this is cardiac related or not.  After long deliberation we decided that cardiac coronary CT angio will be probably most appropriate and I will schedule him to have it done. Status bradycardia denies having any issue right now.  She did wear a Zio patch which showed slowest heart rate 34 average 58 with maximum 114.  1 run of nonsustained supraventricular tachycardia. Status post recent surgery, recovering   Medication Adjustments/Labs and Tests Ordered: Current medicines are reviewed at length with the patient today.  Concerns regarding medicines are outlined above.  No orders of the defined types were placed in this encounter.  Medication changes: No orders of the defined types were placed in this encounter.   Signed, Park Liter, MD, [facility] 10/01/2021 11:19 AM    [place]

## 2021-10-02 ENCOUNTER — Other Ambulatory Visit: Payer: Self-pay | Admitting: Internal Medicine

## 2021-10-02 ENCOUNTER — Ambulatory Visit
Admission: RE | Admit: 2021-10-02 | Discharge: 2021-10-02 | Disposition: A | Discharge status: Home / Self Care | Payer: BC Managed Care – PPO | Source: Ambulatory Visit | Attending: Internal Medicine | Admitting: Internal Medicine

## 2021-10-02 DIAGNOSIS — R61 Generalized hyperhidrosis: Secondary | ICD-10-CM | POA: Diagnosis not present

## 2021-10-02 DIAGNOSIS — R1031 Right lower quadrant pain: Secondary | ICD-10-CM

## 2021-10-02 DIAGNOSIS — R197 Diarrhea, unspecified: Secondary | ICD-10-CM | POA: Diagnosis not present

## 2021-10-02 DIAGNOSIS — R112 Nausea with vomiting, unspecified: Secondary | ICD-10-CM | POA: Diagnosis not present

## 2021-10-02 MED ORDER — IOPAMIDOL (ISOVUE-300) INJECTION 61%
100.0000 mL | Freq: Once | INTRAVENOUS | Status: AC | PRN
  Administered 2021-10-02: 100 mL via INTRAVENOUS

## 2021-10-06 ENCOUNTER — Ambulatory Visit: Payer: BC Managed Care – PPO | Admitting: Cardiology

## 2021-10-09 DIAGNOSIS — R61 Generalized hyperhidrosis: Secondary | ICD-10-CM | POA: Diagnosis not present

## 2021-10-09 DIAGNOSIS — R1031 Right lower quadrant pain: Secondary | ICD-10-CM | POA: Diagnosis not present

## 2021-10-09 DIAGNOSIS — Z23 Encounter for immunization: Secondary | ICD-10-CM | POA: Diagnosis not present

## 2021-10-12 ENCOUNTER — Ambulatory Visit: Payer: BC Managed Care – PPO | Admitting: Neurology

## 2021-10-12 ENCOUNTER — Telehealth (HOSPITAL_COMMUNITY): Payer: Self-pay | Admitting: Emergency Medicine

## 2021-10-12 DIAGNOSIS — R0789 Other chest pain: Secondary | ICD-10-CM

## 2021-10-12 NOTE — Telephone Encounter (Signed)
Reaching out to patient to offer assistance regarding upcoming cardiac imaging study; pt verbalizes understanding of appt date/time, parking situation and where to check in, pre-test NPO status and medications ordered, and verified current allergies; name and call back number provided for further questions should they arise Marchia Bond RN [facility] [phone] office [phone] cell  Getting labs today Holding diuretics x 2 days to support BP for scan/nitro Pt to have a driver for scan Clarise Cruz

## 2021-10-13 ENCOUNTER — Ambulatory Visit: Payer: BC Managed Care – PPO | Admitting: Psychology

## 2021-10-13 ENCOUNTER — Other Ambulatory Visit: Payer: Self-pay

## 2021-10-13 ENCOUNTER — Ambulatory Visit (INDEPENDENT_AMBULATORY_CARE_PROVIDER_SITE_OTHER): Payer: BC Managed Care – PPO | Admitting: Psychology

## 2021-10-13 ENCOUNTER — Encounter: Payer: Self-pay | Admitting: Psychology

## 2021-10-13 DIAGNOSIS — F3132 Bipolar disorder, current episode depressed, moderate: Secondary | ICD-10-CM | POA: Diagnosis not present

## 2021-10-13 DIAGNOSIS — R413 Other amnesia: Secondary | ICD-10-CM

## 2021-10-13 DIAGNOSIS — K7682 Hepatic encephalopathy: Secondary | ICD-10-CM

## 2021-10-13 DIAGNOSIS — E43 Unspecified severe protein-calorie malnutrition: Secondary | ICD-10-CM

## 2021-10-13 DIAGNOSIS — F411 Generalized anxiety disorder: Secondary | ICD-10-CM

## 2021-10-13 DIAGNOSIS — R4189 Other symptoms and signs involving cognitive functions and awareness: Secondary | ICD-10-CM

## 2021-10-13 NOTE — Progress Notes (Signed)
NEUROPSYCHOLOGICAL EVALUATION [place] Department of Neurology  Date of Evaluation: October 13, 2021  Reason for Referral:   MARIJA CALAMARI is a 50 y.o. right-handed Caucasian female referred by Narda Amber, D.O., to characterize her current cognitive functioning and assist with diagnostic clarity and treatment planning in the context of subjective cognitive decline and numerous medical and psychiatric comorbidities.   Assessment and Plan:   Clinical Impression(s): Ms. Adduci pattern of performance is suggestive of performance variability across all aspects of learning and memory. Despite performing low on a visuomotor task assessing cognitive flexibility, all other tasks assessing executive functioning were appropriate. No consistent impairments emerged across any assessed cognitive domain. Performance was appropriate across processing speed, attention/concentration, receptive and expressive language, and visuospatial abilities. While performances in the below average range could represent a decline from previous abilities, there is no testing available for comparison purposes and these may simply represent normal intraindividual variability.   Medically, Ms. Arcos has a very complex medical history with numerous potential contributing factors to subjective cognitive dysfunction. Her recent history of sepsis, delirium, and hepatic encephalopathy in [id number] would certainly create some cognitive dysfunction, especially surrounding attention/concentration, executive functioning, and learning and memory. While some deficits can persist over time, especially executive dysfunction due to delirium, generally these will improve as Ms. Morre improves medically and distances herself from this event. Outside of this, her numerous cardiovascular ailments, fibromyalgia and diffuse chronic pain, thyroid disease, ongoing sleep dysfunction, migraine headaches, and  nutritional deficiencies would further exacerbate day-to-day cognitive dysfunction.   From a psychiatric perspective, Ms. Houseman has a history of bipolar disorder (current episode depressed) and generalized anxiety disorder. Across mood-related questionnaires, she reported acute symptoms of moderate depression and moderate anxiety. Across a more comprehensive personality questionnaire, she elevated numerous clinical subscales, including those surrounding somatic complaints, anxiety, depression, and non-support from others. Her responses across this questionnaire were consistent with a significant depressive experience, generally marked by more physiological symptoms including sleep disturbances, decreased energy, diminished sexual interest, and loss of appetite. She also reported a high degree of preoccupation with physical ailments and complaints. Regarding anxiety, she is likely to be plagued by worry to the degree that her ability to concentrate and attend are significantly compromised. She likely feels a great deal of tension, has difficulties relaxing, and experiences fatigue as a result of high perceived stress. She appears to engage in negative self-evaluation and is likely to be self-critical. She also indicated that current social supports offer little support to help address ongoing distress.   Overall, the most likely culprit for ongoing subjective dysfunction is a combination of the numerous medical and psychiatric factors described above. Current test results suggesting variability across learning and memory are certainly a reasonable finding within this proposed etiology. Due to the sheer volume of possible contributing factors, it is impossible to narrow down which conditions are playing greater roles than others at the present time. However, being more active in addressing psychiatric distress would be an ideal place to start future treatment. Neurologically speaking, recent neuroimaging  suggesting mild to moderate cerebellar atrophy is somewhat unexpected. There is research to suggest that this can occur with prolonged alcohol use/abuse and/or nutritional deficiency; the latter is certainly described in her medical records. This can also be a sign for more rare neurological conditions such as the olivopontocerebellar atrophy subtype of multiple system atrophy (MSA), a spinocerebellar ataxia presentation, a paraneoplastic condition, or other autoimmune condition such as multiple sclerosis (MS).  Imaging did not reveal concerning brain lesions or cancer diagnoses regarding the latter two conditions. Prior neuroimaging also did not reveal anatomical changes in the pons commonly seen in [place]; however, this would not rule out this condition entirely. While these conditions could remain on her differential, many of the factors described above would need to be ameliorated before the risk of underlying neurological contributions could be fully understood. Continued medical monitoring will be important moving forward.   Recommendations: A combination of medication and psychotherapy has been shown to be most effective at treating symptoms of anxiety and depression. As such, Ms. Hulce is encouraged to speak with her prescribing physician regarding medication adjustments to optimally manage these symptoms.   If she is not followed by a psychiatrist, I believe that this would be beneficial. Should her PCP be unable to refer her to a clinician, she could seek one out on her own. The following are resources she could contact to see if they are accepting new patients and take her insurance.  Dr. Modena Morrow - [phone] [facility]) - [facility] [place]) - [phone] Dr. Chucky May [facility]) [phone] [facility] [place]) [phone] [place] ([place]) - [phone] [facility] [place]) - [place], [address] [place], [place] Dr. Garner Nash (neuropsychiatry); Mike Craze; [facility]; 9th Floor; [place], [place]   If not currently utilized, Ms. Scarola is strongly encouraged to start short-term psychotherapy to address symptoms of psychiatric distress. She would benefit from an active and collaborative therapeutic environment, rather than one purely supportive in nature. Recommended treatment modalities include Cognitive Behavioral Therapy (CBT) or Acceptance and Commitment Therapy (ACT).  It will be extremely important for Ms. Greb to appropriate manage nutritional deficiencies as these will worsen cognitive and day-to-day functioning and could be directly responsible for brain atrophy seen on neuroimaging. There have been concerns expressed by her medical providers that Ms. Mayberry stopped taking nutritional supplements or that her adherence is variable at best. Strict adherence will be necessary moving forward.   If other members of her medical team have concerns surrounding an autoimmune condition such as MS, a MRI of her spinal cord would be beneficial in ruling in or out this condition. Her recent brain MRI did not reveal MS-consistent lesions. There are also some instances of a DaTscan being used to assist with a MSA diagnosis. However, this generally has greater utility with the PD subtype of this illness rather than the cerebellar subtype.   Ms. Barcelo is encouraged to attend to lifestyle factors for brain health (e.g., regular physical exercise, good nutrition habits, regular participation in cognitively-stimulating activities, and general stress management techniques), which are likely to have benefits for both emotional adjustment and cognition. In fact, in addition to promoting good general health, regular exercise incorporating aerobic activities  (e.g., brisk walking, jogging, cycling, etc.) has been demonstrated to be a very effective treatment for depression and stress, with similar efficacy rates to both antidepressant medication and psychotherapy. Optimal control of vascular risk factors (including safe cardiovascular exercise and adherence to dietary recommendations) is encouraged. She should continue working with members of her medical team to ensure that all medical ailments are treated in the best way possible.   Continued participation in activities which provide mental stimulation and social interaction is also recommended.  When learning new information, she would benefit from information being broken up into small, manageable pieces. She may also  find it helpful to articulate the material in her own words and in a context to promote encoding at the onset of a new task. This material may need to be repeated multiple times to promote encoding.  Memory can be improved using internal strategies such as rehearsal, repetition, chunking, mnemonics, association, and imagery. External strategies such as written notes in a consistently used memory journal, visual and nonverbal auditory cues such as a calendar on the refrigerator or appointments with alarm, such as on a cell phone, can also help maximize recall.    To address problems with processing speed, she may wish to consider:   -Ensuring that she is alerted when essential material or instructions are being presented   -Adjusting the speed at which new information is presented   -Allowing for more time in comprehending, processing, and responding in conversation  To address problems with fluctuating attention, she may wish to consider:   -Avoiding external distractions when needing to concentrate   -Limiting exposure to fast paced environments with multiple sensory demands   -Writing down complicated information and using checklists   -Attempting and completing one task at a time  (i.e., no multi-tasking)   -Verbalizing aloud each step of a task to maintain focus   -Reducing the amount of information considered at one time  Review of Records:   Ms. Gerbino was seen by [facility] Narda Amber, D.O.) on 12/11/2018 for an evaluation of a right foot drop. Briefly, Ms. Trias was hospitalized for sepsis, hepatic encephalopathy, and drug-induced liver injury (plaquenil/eternacept) from 8/4 - 06/09/2018 at [facility] and transferred to [facility] where she stayed 8/9 - 06/23/2018. Hospitalization was notable for healthcare associated pneumonia, E.coli sepsis, delirium, and liver injury. She was transferred to rehab facility and was noted to have severe generalized weakness, diagnosed with critical illness myopathy. She completed PT and slowly improved where she was able to stand and walk with a walker. During this time, she noticed that her right foot was weak and was having difficulty raising it. She also reported numbness of the right great toe and middle toe. She reported numbness over the right lateral thigh for many years. She also reported chronic low back pain without radicular pain of the legs. She has been using a walker since Spring 2019 because of generalized weakness and frequent falling. She underwent gastric bypass surgery in 2001 and lost 150 lbs. She admitted to having very poor appetite and was eating only one meal per day. Labs were performed; she was noted to be deficient with copper, vitamin B1, and vitamin B12 at that time.   She was seen again by Dr. Posey Pronto on 02/23/2019. She had been compliant with taking her supplements and noted that her right foot paresthesias were worse when she dropped copper down to 2mg  daily. She was reportedly eating better soon after her last visit. However, since the start of the COVID-19 pandemic, her anxiety has increased and she had avoided eating again. Overall, the numbness in her right foot had improved and only involved the toes. She  continued to have weakness of the right foot especially with dorsiflexion and toe extension. Her ability to evert the foot was improved. She did not note any falls. Ms. Rudnicki also reported resting hand tremors which is intermittent and not associated with anxiety. She takes lithium 300mg  twice daily for bipolar disorder.  She was not seen again by Dr. Posey Pronto until 07/30/2021. Starting March 2022, she began having weakness and tingling in both legs  and hands. She also described burning and itching deep inside her legs. She reported painful muscle spasms and cramps of the hands and feet. While previous labs indicated multiple nutrient deficiencies including vitamin B12, vitamin B1, and copper, she reportedly had not been taking supplements for quite some time. She has continued to have poor nutrition and does not eat regular meals. During this appointment, she also described ongoing concerns surrounding memory. Examples included walking into a room and forgetting her intention, as well as getting confused and repeating herself. She also noted being unable to read books due to having trouble recalling the plot. Performance on a brief cognitive screening instrument (MOCA) was 26/30. Ultimately, Ms. Vanderhoff was referred for a comprehensive neuropsychological evaluation to characterize her cognitive abilities and to assist with diagnostic clarity and treatment planning.  EMG on 09/14/2018 revealed severe sensory and motor axonal demyelinating polyneuropathy with chronic denervation, as well as possible superimposed right peroneal neuropathy versus technical issues related to testing. Brain MRI on 03/09/2018 revealed mild generalized volume loss but was otherwise unremarkable. Brain MRI on 06/05/2018 was stable. Head CT on 01/12/2021 was said to reveal stable frontal atrophy. Head CT on 04/29/2021 was negative for any acute intracranial process. Brain MRI on 09/15/2021 revealed mild to moderate cerebellar atrophy.   Past  Medical History:  Diagnosis Date   Abnormal involuntary movement 10/01/2021   Acute liver failure without hepatic coma    AKI (acute kidney injury) 06/05/2018   Alopecia 10/01/2021   Anasarca 06/08/2018   Arthralgia of multiple joints 12/17/2016   Asthma    Ataxia 10/01/2021   Atypical chest pain 10/01/2021   Bilateral knee pain 02/05/2014   Bipolar disorder, unspecified 08/03/2014   Calculus of kidney 12/17/2016   Carpal tunnel syndrome of left wrist 12/17/2016   Cholestasis 10/01/2021   Chondromalacia 02/26/2014   Chronic idiopathic constipation 10/01/2021   Chronic kidney disease    Chronic rhinitis 10/01/2021   Colitis 06/10/2018   Contact dermatitis 10/01/2021   Corn of toe 10/01/2021   Cutaneous eruption 12/17/2016   Delirium due to multiple etiologies 06/11/2018   Drug-induced liver injury 06/08/2018   E coli bacteremia    Edema of lower extremity 10/01/2021   Elevated LFTs 06/08/2018   Encephalopathy, hepatic    Enthesopathy of hip region 10/01/2021   Epigastric pain 06/04/2015   Eustachian tube disorder 10/01/2021   Fatigue 10/01/2021   Fatty infiltration of liver 06/05/2018   Fibromyalgia    Gastroparesis    Generalized anxiety disorder 03/10/2016   GERD (gastroesophageal reflux disease)    HCAP (healthcare-associated pneumonia) 06/09/2018   Hemorrhoid 10/01/2021   Hiatal hernia    Hidradenitis 08/03/2014   History of gastric bypass 12/14/2013   History of nutritional disorder 08/03/2014   HPTH (hyperparathyroidism) 12/17/2016   Hydradenitis 12/17/2016   Hydronephrosis with renal and ureteral calculus obstruction 12/17/2016   Hyperammonemia 06/08/2018   Hyperbilirubinemia 06/05/2018   Hyperesthesia 10/01/2021   Hyperlipidemia 10/01/2021   Hypoalbuminemia 06/10/2018   Hypotension    Hypothyroidism    Hypoxia 06/05/2018   IBS (irritable bowel syndrome)    Insomnia 10/01/2021   Intractable migraine with aura without status migrainosus 12/17/2016    Iron deficiency anemia 12/17/2016   Jaundice 06/10/2018   Left ureteral stone    Major depressive disorder    s/p  bilateral inferior parathyroidectomy 08-03-2010   Microhematuria 01/04/2019   Nocturia 12/17/2016   Pain in left hip 09/08/2017   Peripheral venous insufficiency 10/01/2021   Pharyngeal dysphagia 10/01/2021  Pneumonia    Polyneuropathy 10/01/2021   PONV (postoperative nausea and vomiting)    and claustrophobic with mask   Pruritus 10/01/2021   Recurrent falls 10/01/2021   Rheumatoid arthritis 08/03/2014   Self mutilating behavior    Severe malnutrition    Severe sepsis 06/04/2018   Sinus bradycardia 06/29/2021   Status post bariatric surgery 12/14/2013   Trochanteric bursitis, left hip 09/08/2017   Urgency of urination    Varicose veins of bilateral lower extremities with pain 06/28/2016   Vitamin B12 deficiency (non anemic) 10/01/2021   Vitamin D deficiency 12/14/2013   Vulvitis 10/01/2021   Wears glasses     Past Surgical History:  Procedure Laterality Date   ABDOMINAL HYSTERECTOMY     BALLOON DILATION N/A 10/01/2014   Procedure: BALLOON DILATION;  Surgeon: Garlan Fair, MD;  Location: [facility];  Service: Endoscopy;  Laterality: N/A;   BIOPSY  03/05/2021   Procedure: BIOPSY;  Surgeon: Ronnette Juniper, MD;  Location: [facility];  Service: Gastroenterology;;   CYSTO/  BILATERAL RETROGRADE PYELOGRAM  11/20/2000   CYSTO/  RIGHT RETROGRADE PYELOGRAM/  RIGHT URETEROSCOPY/  STENT PLACEMENT  12/16/2006   CYSTOSCOPY/URETEROSCOPY/HOLMIUM LASER/STENT PLACEMENT Left 11/09/2016   Procedure: CYSTOSCOPY/URETEROSCOPY/HOLMIUM LASER/STENT PLACEMENT;  Surgeon: Nickie Retort, MD;  Location: [facility];  Service: Urology;  Laterality: Left;  90 MINS  [phone]    ESOPHAGEAL MANOMETRY N/A 12/23/2014   Procedure: ESOPHAGEAL MANOMETRY (EM);  Surgeon: Garlan Fair, MD;  Location: [facility];  Service: Endoscopy;  Laterality: N/A;    ESOPHAGOGASTRODUODENOSCOPY  last one 03-28-2015   ESOPHAGOGASTRODUODENOSCOPY (EGD) WITH PROPOFOL N/A 10/01/2014   Procedure: ESOPHAGOGASTRODUODENOSCOPY (EGD) WITH PROPOFOL;  Surgeon: Garlan Fair, MD;  Location: [facility];  Service: Endoscopy;  Laterality: N/A;   ESOPHAGOGASTRODUODENOSCOPY (EGD) WITH PROPOFOL N/A 03/05/2021   Procedure: ESOPHAGOGASTRODUODENOSCOPY (EGD) WITH PROPOFOL;  Surgeon: Ronnette Juniper, MD;  Location: [facility];  Service: Gastroenterology;  Laterality: N/A;   EXTRACORPOREAL SHOCK WAVE LITHOTRIPSY  multiple times since age 52   HOLMIUM LASER APPLICATION Left 01/31/6833   Procedure: HOLMIUM LASER APPLICATION;  Surgeon: Nickie Retort, MD;  Location: [facility];  Service: Urology;  Laterality: Left;   KNEE ARTHROSCOPY Right 03-04-2014  [facility]   w/ Arthrotomy   LAPAROSCOPIC ASSISTED VAGINAL HYSTERECTOMY  12/28/2005   LAPAROSCOPIC CHOLECYSTECTOMY  01/17/2004   LAPAROSCOPY LEFT OVARIAN CYSTECTOMY/  BILATERAL TUBAL LIGATION  02/26/2003   LEFT URETEROSCOPIC STONE EXTRACTION /  STENT PLACEMENT  07/09/2002   NECK EXPLORATION/  BILATERAL INFERIOR PARATHYROIDECTOMY  08/03/2010   RIGHT URETERAL DILATION/  URETEROSCOPIC STONE EXTRACTION  06/12/2010   ROUX-EN-Y GASTRIC BYPASS  2001   SOLYX TRANSURETHRAL SLING/  POSTERIOR PELVIC FLOOR SACROSPINOUS REPAIR  02/21/2009   and Cysto/  Bilateral ureteral stent placement   TONSILLECTOMY AND ADENOIDECTOMY     UPPER GI ENDOSCOPY N/A 08/10/2021   Procedure: UPPER GI ENDOSCOPY;  Surgeon: Greer Pickerel, MD;  Location: [facility];  Service: General;  Laterality: N/A;   WRIST GANGLION EXCISION Right 01/28/2000   XI ROBOTIC ASSISTED HIATAL HERNIA REPAIR N/A 08/10/2021   Procedure: XI ROBOTIC ASSISTED HIATAL HERNIA REPAIR WITH MESH, ROBOTIC LYSIS OF ADHESIONS;  Surgeon: Greer Pickerel, MD;  Location: [facility];  Service: General;  Laterality: N/A;    Current Outpatient Medications:    ALPRAZolam (XANAX) 0.5 MG tablet, Take 0.5 mg by  mouth 3 (three) times daily as needed for anxiety., Disp: , Rfl:    Carboxymeth-Glyc-Polysorb PF (REFRESH OPTIVE MEGA-3) 0.5-1-0.5 % SOLN, Place 1  drop into both eyes 3 (three) times daily as needed (dry/irritated eyes.)., Disp: , Rfl:    dicyclomine (BENTYL) 20 MG tablet, Take 1 tablet (20 mg total) by mouth 3 (three) times daily before meals., Disp: 21 tablet, Rfl: 0   escitalopram (LEXAPRO) 20 MG tablet, Take 20 mg by mouth in the morning., Disp: , Rfl:    esomeprazole (NEXIUM) 40 MG capsule, Take 40 mg by mouth 2 (two) times daily., Disp: , Rfl:    folic acid (FOLVITE) 1 MG tablet, Take 1 mg by mouth in the morning., Disp: , Rfl:    furosemide (LASIX) 20 MG tablet, Take 20 mg by mouth in the morning., Disp: , Rfl:    lactulose (CHRONULAC) 10 GM/15ML solution, Place 45 mLs (30 g total) into feeding tube every 6 (six) hours. (Patient taking differently: Place 30 g into feeding tube daily as needed for mild constipation, moderate constipation or severe constipation (constipation).), Disp: 240 mL, Rfl: 0   levothyroxine (SYNTHROID) 88 MCG tablet, Take 88 mcg by mouth daily before breakfast., Disp: , Rfl:    LINZESS 290 MCG CAPS capsule, Take 290 mcg by mouth daily before breakfast., Disp: , Rfl:    lithium 300 MG tablet, Take 300 mg by mouth 2 (two) times daily., Disp: , Rfl:    lurasidone (LATUDA) 20 MG TABS tablet, Take 20 mg by mouth daily., Disp: , Rfl:    ondansetron (ZOFRAN-ODT) 8 MG disintegrating tablet, Take 1 tablet (8 mg total) by mouth every 6 (six) hours as needed for nausea., Disp: 20 tablet, Rfl: 0   oxyCODONE (OXY IR/ROXICODONE) 5 MG immediate release tablet, Take 1 tablet (5 mg total) by mouth every 6 (six) hours as needed for severe pain or breakthrough pain., Disp: 15 tablet, Rfl: 0   QUEtiapine (SEROQUEL) 100 MG tablet, Take 100 mg by mouth at bedtime., Disp: , Rfl:    rifaximin (XIFAXAN) 550 MG TABS tablet, Take 550 mg by mouth 2 (two) times daily., Disp: , Rfl:    rizatriptan  (MAXALT) 10 MG tablet, Take 10 mg by mouth every 2 (two) hours as needed for migraine., Disp: , Rfl:    spironolactone (ALDACTONE) 50 MG tablet, Take 50 mg by mouth in the morning., Disp: , Rfl:    sucralfate (CARAFATE) 1 GM/10ML suspension, Take 10 mLs (1 g total) by mouth 4 (four) times daily -  with meals and at bedtime., Disp: 420 mL, Rfl: 0   topiramate (TOPAMAX) 25 MG tablet, Take 25 mg by mouth 2 (two) times daily., Disp: , Rfl:    traZODone (DESYREL) 100 MG tablet, Take 200 mg by mouth at bedtime., Disp: , Rfl:    ziprasidone (GEODON) 80 MG capsule, Take 80 mg by mouth 2 (two) times daily., Disp: , Rfl:  No current facility-administered medications for this visit.  Facility-Administered Medications Ordered in Other Visits:    thiamine (B-1) injection 100 mg, 100 mg, Intravenous, Daily, Greer Pickerel, MD  Clinical Interview:   The following information was obtained during a clinical interview with Ms. Tretter prior to cognitive testing.  Cognitive Symptoms: Decreased short-term memory: Endorsed. She described trouble retrieving details from past events or conversations, as well as forgetting tasks she was performing while in the middle of performing them. She also noted trouble reading due to her quickly forgetting previously read information. Difficulties were said to be present prior to 2019. However, during her liver failure and subsequent hospitalization in 2019, memory dysfunction was said to be more severe.  Decreased long-term memory: Endorsed. Following her hospitalization, she described losing memories of her childhood through her 8s.  Decreased attention/concentration: Endorsed. Specifically, she reported notable trouble with increased distractibility.  Reduced processing speed: Endorsed. She stated that she will sometimes not engage in conversation with others due to her being unable to "think quick enough to keep up."  Difficulties with executive functions: Endorsed. She  described significant difficulties with indecision, stating that she has "become very dependant" on her husband even for fairly basic decision making. Outside of this, she noted some trouble with multi-tasking. She denied trouble with impulsivity or any overt personality changes.  Difficulties with emotion regulation: Denied. Difficulties with receptive language: Denied. Difficulties with word finding: Endorsed. Decreased visuoperceptual ability: Endorsed. Specifically, she reported some days where her depth perception is particularly poor to the extent that she will self-limit her driving as she is unable to tell how far away she is from another vehicle.   Trajectory of deficits: As with memory, cognitive dysfunction was present in 2019 prior to her hospitalization. However, dysfunction seemed to notably worsen and not return to her pre-illness baseline following this event. Symptoms were said to come and go rather than represent progressive decline.   Difficulties completing ADLs: Somewhat. She acknowledged some trouble managing her numerous medications, as well as instances where she might not recall if she had taken a medication previously. Her husband provides assistance. Her husband has also taken over financial management and bill paying due to reported difficulties. Ms. Fergeson continues to drive. She noted having good and bad days, with the latter accompanied by depth perception concerns.   Additional Medical History: History of traumatic brain injury/concussion: Denied. However, she noted that during infancy, her head "closed up" faster than typical and that she was regularly monitoring by her pediatrician until around age 55 when this normalized. She reported having an egg-shaped head as a result.  History of stroke: Denied. History of seizure activity: Denied. History of known exposure to toxins: Denied. Symptoms of chronic pain: Endorsed. She reported a history of fibromyalgia and diffuse,  often debilitating chronic pain.  Experience of frequent headaches/migraines: Endorsed. She reported experiencing migraine headache symptoms a couple times per month.  Frequent instances of dizziness/vertigo: Endorsed. Symptoms of dizziness were said to occur often and not only when standing or changing positions quickly. She attributed some of this to having chronically low blood pressure.   Sensory changes: She wears glasses with benefit. She also reported feeling as though her sense of taste had diminished. Other sensory changes/difficulties (e.g., hearing, smell) were denied.  Balance/coordination difficulties: Endorsed. She described her balance as "horrible," noting that she commonly falls at least once per week. She stated that her right side seems weaker and less stable relative to her left. While frequent dizziness influences falling, she noted that overall balance instability seemed to be the greater influence.  Other motor difficulties: Endorsed. She reported tremors in her upper extremities bilaterally. These have been present and gradually worsening since approximately 2017. She also noted instances where her torso will uncontrollably jerk forward and her thumb and finger on her right hand may freeze and/or twitch without warning.  Sleep History: Estimated hours obtained each night: Unclear. At first, she described her sleep as "I don't." With further questioning, she described her sleep as very broken, noting that she likely does not get more than 3-5 hours within a typical 24 hour period.  Difficulties falling asleep: Endorsed. Difficulties staying asleep: Endorsed. Feels rested and refreshed upon  awakening: Denied.  History of snoring: Denied. History of waking up gasping for air: Denied. Witnessed breath cessation while asleep: Denied.  History of vivid dreaming: Denied. Excessive movement while asleep: Endorsed. Generally mild tossing and turning or hitting/kicking behaviors were  said to be present since childhood.  Instances of acting out her dreams: Denied.  Psychiatric/Behavioral Health History: Depression: Endorsed. She acknowledged a history of bipolar I disorder and reported currently being in the midst of a depressive episode. She described herself as "very unhappy" at the present time. Recently, she reported feeling as though her medical providers were so focused on her liver failure that her mental health has been neglected. She did report that she was in the process of meeting with a new psychiatric provider next month to better address these symptoms. While she acknowledged suicidal ideation with a plan many years prior, she denied ever acting on these thoughts. Current suicidal ideation, intent, or plan was denied.  Anxiety: Endorsed. Symptoms of generalized anxiety were said to be longstanding in nature and sometimes quite severe. In particular, she described feeling "out of control" and expressed fears surrounding cognitive decline, as well as declines in her overall health.  Mania: Endorsed. As stated above, she has a history of bipolar I disorder. This is generally managed well with lithium.  Trauma History: Denied. Visual/auditory hallucinations: Endorsed. She reported experiencing visual hallucinations around the time she was dealing with significant liver dysfunction. Medical records that she did experience a bout of delirium while in the hospital. Since then, these symptoms have largely dissipated. However, she did acknowledge hearing indiscriminate voices in her head from time to time.  Delusional thoughts: Denied.  Tobacco: Endorsed. She reported smoking one pack of cigarettes daily.  Alcohol: She denied current alcohol consumption as well as a history of problematic alcohol abuse or dependence.  Recreational drugs: Denied.  Family History: Problem Relation Age of Onset   Bipolar disorder Son    Breast cancer Mother    Breast cancer Sister    Anxiety  disorder Brother    Depression Brother    Breast cancer Maternal Aunt    Breast cancer Maternal Grandmother    This information was confirmed by Ms. Spadafore.  Academic/Vocational History: Highest level of educational attainment: 18 years. 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 Math was noted as a likely relative weakness in earlier academic settings.  History of developmental delay: Denied. History of grade repetition: Denied. Enrollment in special education courses: Denied. History of LD/ADHD: Denied.  Employment: Unemployed. 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   Evaluation Results:   Behavioral Observations: Ms. Gruhn was unaccompanied, arrived to her appointment on time, and was appropriately dressed and groomed. She appeared alert and oriented. Observed gait and station were within normal limits. Gross motor functioning appeared intact upon informal observation and no abnormal movements (e.g., tremors) were noted. Her affect was somewhat flat, subdued, and depressed. She did become tearful when discussing past medical ailments and current memory concerns. Spontaneous speech was fluent and word finding difficulties were not observed during the clinical interview. Thought processes were coherent, organized, and normal in content. Insight into her cognitive difficulties appeared adequate. During testing, her affect was notable flat and she appeared depressed. Sustained attention was appropriate. Task engagement was adequate and she persisted when challenged. Overall, Ms. Kamaka was cooperative with the clinical interview and subsequent  testing procedures.   Adequacy of Effort: The validity of neuropsychological testing is limited by the extent to which the individual being tested may be assumed to have exerted adequate effort during testing.  Ms. Mone expressed her intention to perform to the best of her abilities and exhibited adequate task engagement and persistence. Scores across stand-alone and embedded performance validity measures were within expectation. As such, the results of the current evaluation are believed to be a valid representation of Ms. Simao current cognitive functioning.  Test Results: Ms. Hillman was largely oriented at the time of the current evaluation. She was one day off when stating the current date and incorrectly named the current clinic as [facility].   Intellectual abilities based upon educational and vocational attainment were estimated to be in the average to above average range. Premorbid abilities were estimated to be within the average range based upon a single-word reading test.   Processing speed was below average to average. Basic attention was below average. More complex attention (e.g., working memory) was average. Executive functioning was exceptionally low on a visuomotor sequencing task but average to well above average across all other tasks.  While not directly assessed, receptive language abilities were believed to be intact. Likewise, Ms. Micale did not exhibit any difficulties comprehending task instructions and answered all questions asked of her appropriately. Assessed expressive language (e.g., verbal fluency and confrontation naming) was below average to average.     Assessed visuospatial/visuoconstructional abilities were below average to average.    Learning (i.e., encoding) of novel verbal and visual information was largely well below average. However, she performed in the above average range when learning medication instructions and a name, address, and phone number. Spontaneous delayed recall (i.e., retrieval) of previously learned information was generally commensurate with performance across initial learning trials. Retention rates were 93% across a story learning  task, 60% across a list learning task, and 80% across a shape learning task. Performance across recognition tasks was variable and commensurate with performances across initial learning trials, suggesting some evidence for information consolidation.   Results of emotional screening instruments suggested that recent symptoms of generalized anxiety were in the moderate range, while symptoms of depression were also within the moderate range. Across a comprehensive personality questionnaire, she elevated clinical subscales surrounding symptoms of somatic complaints, anxiety, depression, and non-support from others. A screening instrument assessing recent sleep quality suggested the presence of moderate sleep dysfunction.  Tables of Scores:   Note: This summary of test scores accompanies the interpretive report and should not be considered in isolation without reference to the appropriate sections in the text. Descriptors are based on appropriate normative data and may be adjusted based on clinical judgment. Terms such as "Within Normal Limits" and "Outside Normal Limits" are used when a more specific description of the test score cannot be determined.       Percentile - Normative Descriptor > 98 - Exceptionally High 91-97 - Well Above Average 75-90 - Above Average 25-74 - Average 9-24 - Below Average 2-8 - Well Below Average < 2 - Exceptionally Low       Validity:   DESCRIPTOR       ACS Word Choice: --- --- Within Normal Limits  Dot Counting Test: --- --- Within Normal Limits  NAB EVI: --- --- Within Normal Limits  D-KEFS Color Word Effort Index: --- --- Within Normal Limits       Orientation:      Raw Score Percentile   NAB Orientation,  Form 1 26/29 --- ---       Cognitive Screening:      Raw Score Percentile   SLUMS: 25/30 --- ---       Intellectual Functioning:      Standard Score Percentile   Test of Premorbid Functioning: 101 53 Average       Memory:     NAB Memory Module, Form  1: T Score Percentile   List Learning       Total Trials 1-3 20/36 (36) 8 Well Below Average    List B 5/12 (48) 42 Average    Short Delay Free Recall 5/12 (30) 2 Well Below Average    Long Delay Free Recall 3/12 (22) <1 Exceptionally Low    Retention Percentage 60 (38) 12 Below Average    Recognition Discriminability 2 (29) 2 Well Below Average  Shape Learning       Total Trials 1-3 13/27 (36) 8 Well Below Average    Delayed Recall 4/9 (32) 4 Well Below Average    Retention Percentage 80 (43) 25 Average    Recognition Discriminability 3 (27) 1 Exceptionally Low  Story Learning       Immediate Recall 54/80 (35) 7 Well Below Average    Delayed Recall 28/40 (35) 7 Well Below Average    Retention Percentage 93 (51) 54 Average  Daily Living Memory       Immediate Recall 48/51 (57) 75 Above Average    Delayed Recall 16/17 (57) 75 Above Average    Retention Percentage 94 (52) 58 Average    Recognition Hits 10/10 (58) 79 Above Average       Attention/Executive Function:     Trail Making Test (TMT): Raw Score (T Score) Percentile     Part A 29 secs.,  0 errors (43) 25 Average    Part B 118 secs.,  1 error (29) 2 Exceptionally Low         Scaled Score Percentile   WAIS-IV Coding: 6 9 Below Average       NAB Attention Module, Form 1: T Score Percentile     Digits Forward 39 14 Below Average    Digits Backwards 49 46 Average       D-KEFS Color-Word Interference Test: Raw Score (Scaled Score) Percentile     Color Naming 27 secs. (11) 63 Average    Word Reading 23 secs. (10) 50 Average    Inhibition 59 secs. (10) 50 Average      Total Errors 1 error (11) 63 Average    Inhibition/Switching 69 secs. (10) 50 Average      Total Errors 2 errors (10) 50 Average       D-KEFS Verbal Fluency Test: Raw Score (Scaled Score) Percentile     Letter Total Correct 38 (10) 50 Average    Category Total Correct 40 (11) 63 Average    Category Switching Total Correct 16 (14) 91 Well Above Average     Category Switching Accuracy 15 (13) 84 Above Average      Total Set Loss Errors 0 (13) 84 Above Average      Total Repetition Errors 3 (10) 50 Average       Language:     Verbal Fluency Test: Raw Score (T Score) Percentile     Phonemic Fluency (FAS) 38 (42) 21 Below Average    Animal Fluency 24 (50) 50 Average        NAB Language Module, Form 1: T Score Percentile  Naming 31/31 (53) 62 Average       Visuospatial/Visuoconstruction:      Raw Score Percentile   Clock Drawing: 9/10 --- Within Normal Limits       NAB Spatial Module, Form 1: T Score Percentile     Figure Drawing Copy 43 25 Average        Scaled Score Percentile   WAIS-IV Block Design: 6 9 Below Average  WAIS-IV Matrix Reasoning: 6 9 Below Average       Mood and Personality:      Raw Score Percentile   Beck Depression Inventory - II: 26 --- Moderate  PROMIS Anxiety Questionnaire: 27 --- Moderate       Personality Assessment Inventory: T Score  Percentile     Inconsistency 58 --- Within Normal Limits    Infrequency 59 --- Within Normal Limits    Negative Impression 51 --- Within Normal Limits    Positive Impression 48 --- Within Normal Limits    Somatic Complaints 73 --- Elevated    Anxiety 71 --- Elevated    Anxiety-Related Disorders 55 --- Within Normal Limits    Depression 76 --- Elevated    Mania 28 --- Within Normal Limits    Paranoia 48 --- Within Normal Limits    Schizophrenia 59 --- Within Normal Limits    Borderline Features 55 --- Within Normal Limits    Antisocial Features 38 --- Within Normal Limits    Alcohol Problems 43 --- Within Normal Limits    Drug Problems 42 --- Within Normal Limits    Aggression 42 --- Within Normal Limits    Suicidal Ideation 62 --- Within Normal Limits    Stress 48 --- Within Normal Limits    Non Support 72 --- Elevated    Treatment Rejection 46 --- Within Normal Limits    Dominance 38 --- Within Normal Limits    Warmth 40 --- Within Normal Limits        Additional Questionnaires:      Raw Score Percentile   PROMIS Sleep Disturbance Questionnaire: 33 --- Moderate   Informed Consent and Coding/Compliance:   The current evaluation represents a clinical evaluation for the purposes previously outlined by the referral source and is in no way reflective of a forensic evaluation.   Ms. Nathanson was provided with a verbal description of the nature and purpose of the present neuropsychological evaluation. Also reviewed were the foreseeable risks and/or discomforts and benefits of the procedure, limits of confidentiality, and mandatory reporting requirements of this provider. The patient was given the opportunity to ask questions and receive answers about the evaluation. Oral consent to participate was provided by the patient.   This evaluation was conducted by Christia Reading, Ph.D., ABPP-CN, board certified clinical neuropsychologist. Ms. Berhe completed a clinical interview with Dr. Melvyn Novas, billed as one unit [phone], and 145 minutes of cognitive testing and scoring, billed as one unit [phone] and four additional units [id number]. Psychometrist Milana Kidney, B.S., assisted Dr. Melvyn Novas with test administration and scoring procedures. As a separate and discrete service, Dr. Melvyn Novas spent a total of 190 minutes in interpretation and report writing billed as one unit [phone] and two units 96133.

## 2021-10-13 NOTE — Progress Notes (Signed)
° °  Psychometrician Note   Cognitive testing was administered to Jordan Russell by Milana Kidney, B.S. (psychometrist) under the supervision of Dr. Christia Reading, Ph.D., licensed psychologist on 10/13/21. Jordan Russell did not appear overtly distressed by the testing session per behavioral observation or responses across self-report questionnaires. Rest breaks were offered.    The battery of tests administered was selected by Dr. Christia Reading, Ph.D. with consideration to Jordan Russell's current level of functioning, the nature of her symptoms, emotional and behavioral responses during interview, level of literacy, observed level of motivation/effort, and the nature of the referral question. This battery was communicated to the psychometrist. Communication between Dr. Christia Reading, Ph.D. and the psychometrist was ongoing throughout the evaluation and Dr. Christia Reading, Ph.D. was immediately accessible at all times. Dr. Christia Reading, Ph.D. provided supervision to the psychometrist on the date of this service to the extent necessary to assure the quality of all services provided.    Jordan Russell will return within approximately 1-2 weeks for an interactive feedback session with Jordan Russell at which time her test performances, clinical impressions, and treatment recommendations will be reviewed in detail. Jordan Russell understands she can contact our office should she require our assistance before this time.  A total of 145 minutes of billable time were spent face-to-face with Jordan Russell by the psychometrist. This includes both test administration and scoring time. Billing for these services is reflected in the clinical report generated by Dr. Christia Reading, Ph.D.  This note reflects time spent with the psychometrician and does not include test scores or any clinical interpretations made by Jordan Russell. The full report will follow in a separate note.

## 2021-10-14 ENCOUNTER — Ambulatory Visit (HOSPITAL_COMMUNITY)
Admission: RE | Admit: 2021-10-14 | Discharge: 2021-10-14 | Disposition: A | Discharge status: Home / Self Care | Payer: BC Managed Care – PPO | Source: Ambulatory Visit | Attending: Cardiology | Admitting: Cardiology

## 2021-10-14 DIAGNOSIS — R001 Bradycardia, unspecified: Secondary | ICD-10-CM | POA: Diagnosis not present

## 2021-10-14 DIAGNOSIS — R0789 Other chest pain: Secondary | ICD-10-CM | POA: Diagnosis not present

## 2021-10-14 DIAGNOSIS — I950 Idiopathic hypotension: Secondary | ICD-10-CM | POA: Insufficient documentation

## 2021-10-14 LAB — POCT I-STAT CREATININE: Creatinine, Ser: 0.9 mg/dL (ref 0.44–1.00)

## 2021-10-14 MED ORDER — SODIUM CHLORIDE 0.9 % IV SOLN: INTRAVENOUS | Status: DC

## 2021-10-14 MED ORDER — NITROGLYCERIN 0.4 MG SL SUBL: 0.8000 mg | SUBLINGUAL_TABLET | Freq: Once | SUBLINGUAL | Status: DC

## 2021-10-14 MED ORDER — IOHEXOL 350 MG/ML SOLN
95.0000 mL | Freq: Once | INTRAVENOUS | Status: AC | PRN
  Administered 2021-10-14: 15:00:00 95 mL via INTRAVENOUS

## 2021-10-14 NOTE — Progress Notes (Signed)
Called Fr. Drue Dun regarding pt BP we discussed the NTG dose.  He order  liter of IV fluid, proceed with CT and give 2 NTGd/t the probability of the scan needing to go to [facility]

## 2021-10-20 ENCOUNTER — Other Ambulatory Visit: Payer: Self-pay

## 2021-10-20 ENCOUNTER — Ambulatory Visit (INDEPENDENT_AMBULATORY_CARE_PROVIDER_SITE_OTHER): Payer: BC Managed Care – PPO | Admitting: Psychology

## 2021-10-20 DIAGNOSIS — F3132 Bipolar disorder, current episode depressed, moderate: Secondary | ICD-10-CM | POA: Diagnosis not present

## 2021-10-20 DIAGNOSIS — F411 Generalized anxiety disorder: Secondary | ICD-10-CM | POA: Diagnosis not present

## 2021-10-20 DIAGNOSIS — M797 Fibromyalgia: Secondary | ICD-10-CM

## 2021-10-20 DIAGNOSIS — R413 Other amnesia: Secondary | ICD-10-CM

## 2021-10-20 NOTE — Progress Notes (Signed)
° °  Neuropsychology Feedback Session Tillie Rung. [place] [facility]  Reason for Referral:   KALESE ENSZ is a 50 y.o. right-handed Caucasian female referred by Narda Amber, D.O., to characterize her current cognitive functioning and assist with diagnostic clarity and treatment planning in the context of subjective cognitive decline and numerous medical and psychiatric comorbidities.   Feedback:   Ms. Loyola completed a comprehensive neuropsychological evaluation on 10/13/2021. Please refer to that encounter for the full report and recommendations. Briefly, results suggested performance variability across all aspects of learning and memory. Despite performing low on a visuomotor task assessing cognitive flexibility, all other tasks assessing executive functioning were appropriate. No consistent impairments emerged across any assessed cognitive domain. Overall, the most likely culprit for ongoing subjective dysfunction is a combination of the numerous medical and psychiatric factors. Current test results suggesting variability across learning and memory are certainly a reasonable finding within this proposed etiology. Due to the sheer volume of possible contributing factors, it is impossible to narrow down which conditions are playing greater roles than others at the present time. However, being more active in addressing psychiatric distress would be an ideal place to start future treatment.  Ms. Folmer was unaccompanied during the current feedback session. Content of the current session focused on the results of her neuropsychological evaluation. Ms. Skilton was given the opportunity to ask questions and her questions were answered. She was encouraged to reach out should additional questions arise. A copy of her report was provided at the conclusion of the visit.      25 minutes were spent conducting the current feedback session with Ms. Paro, billed as  one unit [phone].

## 2021-11-16 DIAGNOSIS — J019 Acute sinusitis, unspecified: Secondary | ICD-10-CM | POA: Diagnosis not present

## 2021-11-16 DIAGNOSIS — R051 Acute cough: Secondary | ICD-10-CM | POA: Diagnosis not present

## 2021-12-01 DIAGNOSIS — D81818 Other biotin-dependent carboxylase deficiency: Secondary | ICD-10-CM | POA: Diagnosis not present

## 2021-12-01 DIAGNOSIS — F319 Bipolar disorder, unspecified: Secondary | ICD-10-CM | POA: Diagnosis not present

## 2021-12-01 DIAGNOSIS — F410 Panic disorder [episodic paroxysmal anxiety] without agoraphobia: Secondary | ICD-10-CM | POA: Diagnosis not present

## 2021-12-01 DIAGNOSIS — F5101 Primary insomnia: Secondary | ICD-10-CM | POA: Diagnosis not present

## 2021-12-01 DIAGNOSIS — R1031 Right lower quadrant pain: Secondary | ICD-10-CM | POA: Diagnosis not present

## 2021-12-01 DIAGNOSIS — D509 Iron deficiency anemia, unspecified: Secondary | ICD-10-CM | POA: Diagnosis not present

## 2021-12-01 DIAGNOSIS — F3181 Bipolar II disorder: Secondary | ICD-10-CM | POA: Diagnosis not present

## 2021-12-02 ENCOUNTER — Encounter: Payer: Self-pay | Admitting: Cardiology

## 2021-12-02 ENCOUNTER — Other Ambulatory Visit: Payer: Self-pay

## 2021-12-02 ENCOUNTER — Ambulatory Visit (INDEPENDENT_AMBULATORY_CARE_PROVIDER_SITE_OTHER): Payer: BC Managed Care – PPO | Admitting: Cardiology

## 2021-12-02 ENCOUNTER — Ambulatory Visit: Payer: BC Managed Care – PPO | Admitting: Physician Assistant

## 2021-12-02 VITALS — BP 96/62 | HR 70 | Ht 66.5 in | Wt 174.0 lb

## 2021-12-02 DIAGNOSIS — R0789 Other chest pain: Secondary | ICD-10-CM | POA: Diagnosis not present

## 2021-12-02 DIAGNOSIS — E782 Mixed hyperlipidemia: Secondary | ICD-10-CM

## 2021-12-02 DIAGNOSIS — I872 Venous insufficiency (chronic) (peripheral): Secondary | ICD-10-CM

## 2021-12-02 DIAGNOSIS — I950 Idiopathic hypotension: Secondary | ICD-10-CM | POA: Diagnosis not present

## 2021-12-02 MED ORDER — MIDODRINE HCL 2.5 MG PO TABS: 2.5000 mg | ORAL_TABLET | Freq: Four times a day (QID) | ORAL | 2 refills | Status: DC | PRN

## 2021-12-02 NOTE — Patient Instructions (Signed)
Medication Instructions:  Your physician has recommended you make the following change in your medication:   START: Midodrine 2.5 mg every 6 hours as needed  *If you need a refill on your cardiac medications before your next appointment, please call your pharmacy*   Lab Work: None If you have labs (blood work) drawn today and your tests are completely normal, you will receive your results only by: [place] (if you have MyChart) OR A paper copy in the mail If you have any lab test that is abnormal or we need to change your treatment, we will call you to review the results.   Testing/Procedures: None   Follow-Up: At [facility], you and your health needs are our priority.  As part of our continuing mission to provide you with exceptional heart care, we have created designated Provider Care Teams.  These Care Teams include your primary Cardiologist (physician) and Advanced Practice Providers (APPs -  Physician Assistants and Nurse Practitioners) who all work together to provide you with the care you need, when you need it.  We recommend signing up for the patient portal called "MyChart".  Sign up information is provided on this After Visit Summary.  MyChart is used to connect with patients for Virtual Visits (Telemedicine).  Patients are able to view lab/test results, encounter notes, upcoming appointments, etc.  Non-urgent messages can be sent to your provider as well.   To learn more about what you can do with MyChart, go to [email or web address].    Your next appointment:   3 month(s)  The format for your next appointment:   In Person  Provider:   Jenne Campus, MD    Other Instructions None

## 2021-12-02 NOTE — Progress Notes (Signed)
Cardiology Office Note:    Date:  12/02/2021   ID:  Jordan Russell, DOB May 08, 1971, MRN [id number]  PCP:  Josetta Huddle, MD  Cardiologist:  Jenne Campus, MD    Referring MD: Josetta Huddle, MD   No chief complaint on file. Am still having chest pain and dizziness when I get up  History of Present Illness:    Jordan Russell is a 51 y.o. female  with complex past medical history.  She does have history of gastric bypass surgery, recently she had a mesh repair of her large hiatal hernia, does have history of bradycardia as well as idiopathic hypotension.  She was evaluated before surgery.  Stress testing which was negative.  However she still complain of having a lot of chest pain. Eventually seen by pelvic coronary CT angio.  Her calcium score is 0 and her coronary arteries are normal present no obstructive disease.  We had a long discussion today about this I told her her symptoms of pathology suggest no issue with her heart.  She did have some GI surgery before she is going to follow-up with her GI doctor.  I think that is an appropriate move in terms of low blood pressure is still a situation happen.  I asked her to stop diuretics however her legs become severely swollen she was put back on those medications.  I think we reached the point of medications needed for her hypertension.  Days that she feels very weak tired exhausted she cannot function when she has low blood pressure.  Past Medical History:  Diagnosis Date   Abnormal involuntary movement 10/01/2021   Acute liver failure without hepatic coma    AKI (acute kidney injury) 06/05/2018   Alopecia 10/01/2021   Anasarca 06/08/2018   Arthralgia of multiple joints 12/17/2016   Asthma    Ataxia 10/01/2021   Atypical chest pain 10/01/2021   Bilateral knee pain 02/05/2014   Bipolar disorder, unspecified 08/03/2014   Calculus of kidney 12/17/2016   Carpal tunnel syndrome of left wrist 12/17/2016   Cholestasis 10/01/2021    Chondromalacia 02/26/2014   Chronic idiopathic constipation 10/01/2021   Chronic kidney disease    Chronic rhinitis 10/01/2021   Colitis 06/10/2018   Contact dermatitis 10/01/2021   Corn of toe 10/01/2021   Cutaneous eruption 12/17/2016   Delirium due to multiple etiologies 06/11/2018   Drug-induced liver injury 06/08/2018   E coli bacteremia    Edema of lower extremity 10/01/2021   Elevated LFTs 06/08/2018   Encephalopathy, hepatic    Enthesopathy of hip region 10/01/2021   Epigastric pain 06/04/2015   Eustachian tube disorder 10/01/2021   Fatigue 10/01/2021   Fatty infiltration of liver 06/05/2018   Fibromyalgia    Gastroparesis    Generalized anxiety disorder 03/10/2016   GERD (gastroesophageal reflux disease)    HCAP (healthcare-associated pneumonia) 06/09/2018   Hemorrhoid 10/01/2021   Hiatal hernia    Hidradenitis 08/03/2014   History of gastric bypass 12/14/2013   History of nutritional disorder 08/03/2014   HPTH (hyperparathyroidism) 12/17/2016   Hydradenitis 12/17/2016   Hydronephrosis with renal and ureteral calculus obstruction 12/17/2016   Hyperammonemia 06/08/2018   Hyperbilirubinemia 06/05/2018   Hyperesthesia 10/01/2021   Hyperlipidemia 10/01/2021   Hypoalbuminemia 06/10/2018   Hypotension    Hypothyroidism    Hypoxia 06/05/2018   IBS (irritable bowel syndrome)    Insomnia 10/01/2021   Intractable migraine with aura without status migrainosus 12/17/2016   Iron deficiency anemia 12/17/2016   Jaundice 06/10/2018  Left ureteral stone    Major depressive disorder    s/p  bilateral inferior parathyroidectomy 08-03-2010   Microhematuria 01/04/2019   Nocturia 12/17/2016   Pain in left hip 09/08/2017   Peripheral venous insufficiency 10/01/2021   Pharyngeal dysphagia 10/01/2021   Pneumonia    Polyneuropathy 10/01/2021   PONV (postoperative nausea and vomiting)    and claustrophobic with mask   Pruritus 10/01/2021   Recurrent falls 10/01/2021    Rheumatoid arthritis 08/03/2014   Self mutilating behavior    Severe malnutrition    Severe sepsis 06/04/2018   Sinus bradycardia 06/29/2021   Status post bariatric surgery 12/14/2013   Trochanteric bursitis, left hip 09/08/2017   Urgency of urination    Varicose veins of bilateral lower extremities with pain 06/28/2016   Vitamin B12 deficiency (non anemic) 10/01/2021   Vitamin D deficiency 12/14/2013   Vulvitis 10/01/2021   Wears glasses     Past Surgical History:  Procedure Laterality Date   ABDOMINAL HYSTERECTOMY     BALLOON DILATION N/A 10/01/2014   Procedure: BALLOON DILATION;  Surgeon: Garlan Fair, MD;  Location: [facility];  Service: Endoscopy;  Laterality: N/A;   BIOPSY  03/05/2021   Procedure: BIOPSY;  Surgeon: Ronnette Juniper, MD;  Location: [facility];  Service: Gastroenterology;;   CYSTO/  BILATERAL RETROGRADE PYELOGRAM  11/20/2000   CYSTO/  RIGHT RETROGRADE PYELOGRAM/  RIGHT URETEROSCOPY/  STENT PLACEMENT  12/16/2006   CYSTOSCOPY/URETEROSCOPY/HOLMIUM LASER/STENT PLACEMENT Left 11/09/2016   Procedure: CYSTOSCOPY/URETEROSCOPY/HOLMIUM LASER/STENT PLACEMENT;  Surgeon: Nickie Retort, MD;  Location: [facility];  Service: Urology;  Laterality: Left;  90 MINS  [phone]    ESOPHAGEAL MANOMETRY N/A 12/23/2014   Procedure: ESOPHAGEAL MANOMETRY (EM);  Surgeon: Garlan Fair, MD;  Location: [facility];  Service: Endoscopy;  Laterality: N/A;   ESOPHAGOGASTRODUODENOSCOPY  last one 03-28-2015   ESOPHAGOGASTRODUODENOSCOPY (EGD) WITH PROPOFOL N/A 10/01/2014   Procedure: ESOPHAGOGASTRODUODENOSCOPY (EGD) WITH PROPOFOL;  Surgeon: Garlan Fair, MD;  Location: [facility];  Service: Endoscopy;  Laterality: N/A;   ESOPHAGOGASTRODUODENOSCOPY (EGD) WITH PROPOFOL N/A 03/05/2021   Procedure: ESOPHAGOGASTRODUODENOSCOPY (EGD) WITH PROPOFOL;  Surgeon: Ronnette Juniper, MD;  Location: [facility];  Service: Gastroenterology;  Laterality: N/A;   EXTRACORPOREAL SHOCK WAVE  LITHOTRIPSY  multiple times since age 33   HOLMIUM LASER APPLICATION Left 02/03/9200   Procedure: HOLMIUM LASER APPLICATION;  Surgeon: Nickie Retort, MD;  Location: [facility];  Service: Urology;  Laterality: Left;   KNEE ARTHROSCOPY Right 03-04-2014  [facility]   w/ Arthrotomy   LAPAROSCOPIC ASSISTED VAGINAL HYSTERECTOMY  12/28/2005   LAPAROSCOPIC CHOLECYSTECTOMY  01/17/2004   LAPAROSCOPY LEFT OVARIAN CYSTECTOMY/  BILATERAL TUBAL LIGATION  02/26/2003   LEFT URETEROSCOPIC STONE EXTRACTION /  STENT PLACEMENT  07/09/2002   NECK EXPLORATION/  BILATERAL INFERIOR PARATHYROIDECTOMY  08/03/2010   RIGHT URETERAL DILATION/  URETEROSCOPIC STONE EXTRACTION  06/12/2010   ROUX-EN-Y GASTRIC BYPASS  2001   SOLYX TRANSURETHRAL SLING/  POSTERIOR PELVIC FLOOR SACROSPINOUS REPAIR  02/21/2009   and Cysto/  Bilateral ureteral stent placement   TONSILLECTOMY AND ADENOIDECTOMY     UPPER GI ENDOSCOPY N/A 08/10/2021   Procedure: UPPER GI ENDOSCOPY;  Surgeon: Greer Pickerel, MD;  Location: [facility];  Service: General;  Laterality: N/A;   WRIST GANGLION EXCISION Right 01/28/2000   XI ROBOTIC ASSISTED HIATAL HERNIA REPAIR N/A 08/10/2021   Procedure: XI ROBOTIC ASSISTED HIATAL HERNIA REPAIR WITH MESH, ROBOTIC LYSIS OF ADHESIONS;  Surgeon: Greer Pickerel, MD;  Location: [facility];  Service: General;  Laterality: N/A;    Current Medications: Current Meds  Medication Sig   ALPRAZolam (XANAX) 0.5 MG tablet Take 0.5 mg by mouth 3 (three) times daily as needed for anxiety.   Carboxymeth-Glyc-Polysorb PF (REFRESH OPTIVE MEGA-3) 0.5-1-0.5 % SOLN Place 1 drop into both eyes 3 (three) times daily as needed (dry/irritated eyes.).   dicyclomine (BENTYL) 20 MG tablet Take 1 tablet (20 mg total) by mouth 3 (three) times daily before meals.   escitalopram (LEXAPRO) 20 MG tablet Take 20 mg by mouth in the morning.   esomeprazole (NEXIUM) 40 MG capsule Take 40 mg by mouth 2 (two) times daily.   folic acid (FOLVITE) 1 MG  tablet Take 1 mg by mouth in the morning.   furosemide (LASIX) 20 MG tablet Take 20 mg by mouth in the morning.   lactulose (CHRONULAC) 10 GM/15ML solution Place 45 mLs (30 g total) into feeding tube every 6 (six) hours. (Patient taking differently: Place 30 g into feeding tube daily as needed for mild constipation, moderate constipation or severe constipation (constipation).)   levothyroxine (SYNTHROID) 88 MCG tablet Take 88 mcg by mouth daily before breakfast.   LINZESS 290 MCG CAPS capsule Take 290 mcg by mouth daily before breakfast.   lithium 300 MG tablet Take 300 mg by mouth 2 (two) times daily.   lurasidone (LATUDA) 20 MG TABS tablet Take 20 mg by mouth daily.   ondansetron (ZOFRAN-ODT) 8 MG disintegrating tablet Take 1 tablet (8 mg total) by mouth every 6 (six) hours as needed for nausea.   QUEtiapine (SEROQUEL) 300 MG tablet Take 300 mg by mouth at bedtime.   rifaximin (XIFAXAN) 550 MG TABS tablet Take 550 mg by mouth 2 (two) times daily.   rizatriptan (MAXALT) 10 MG tablet Take 10 mg by mouth every 2 (two) hours as needed for migraine.   spironolactone (ALDACTONE) 50 MG tablet Take 50 mg by mouth in the morning.   topiramate (TOPAMAX) 25 MG tablet Take 25 mg by mouth 2 (two) times daily.     Allergies:   Gabapentin, Sulfa antibiotics, Sulfasalazine, Lamictal [lamotrigine], Lithium, Penicillins, Tylenol [acetaminophen], Morphine and related, and Sulfur   Social History   Socioeconomic History   Marital status: Married    Spouse name: Not on file   Number of children: 1   Years of education: 63   Highest education level: Master's degree (e.g., MA, MS, MEng, MEd, MSW, MBA)  Occupational History   Occupation: disabled   Tobacco Use   Smoking status: Every Day    Packs/day: 1.00    Years: 20.00    Pack years: 20.00    Types: Cigarettes   Smokeless tobacco: Never  Vaping Use   Vaping Use: Never used  Substance and Sexual Activity   Alcohol use: Not Currently    Comment:  very rare   Drug use: No   Sexual activity: Not on file  Other Topics Concern   Not on file  Social History Narrative   Right handed   Lives with husband, son and son's best friend in a one story home.  Education: Oceanographer.    Social Determinants of Health   Financial Resource Strain: Not on file  Food Insecurity: Not on file  Transportation Needs: Not on file  Physical Activity: Not on file  Stress: Not on file  Social Connections: Not on file     Family History: The patient's family history includes Anxiety disorder in her brother; Bipolar disorder in her son; Breast cancer in  her maternal aunt, maternal grandmother, mother, and sister; Depression in her brother. ROS:   Please see the history of present illness.    All 14 point review of systems negative except as described per history of present illness  EKGs/Labs/Other Studies Reviewed:      Recent Labs: 08/16/2021: ALT 28; BUN 11; Hemoglobin 13.3; Platelets 272; Potassium 3.4; Sodium 137 10/14/2021: Creatinine, Ser 0.90  Recent Lipid Panel No results found for: CHOL, TRIG, HDL, CHOLHDL, VLDL, LDLCALC, LDLDIRECT  Physical Exam:    VS:  BP 96/62 (BP Location: Left Arm, Patient Position: Standing)    Pulse 70    Ht 5' 6.5" (1.689 m)    Wt 174 lb (78.9 kg)    SpO2 97%    BMI 27.66 kg/m     Wt Readings from Last 3 Encounters:  12/02/21 174 lb (78.9 kg)  10/01/21 171 lb (77.6 kg)  08/16/21 171 lb (77.6 kg)     GEN:  Well nourished, well developed in no acute distress HEENT: Normal NECK: No JVD; No carotid bruits LYMPHATICS: No lymphadenopathy CARDIAC: RRR, no murmurs, no rubs, no gallops RESPIRATORY:  Clear to auscultation without rales, wheezing or rhonchi  ABDOMEN: Soft, non-tender, non-distended MUSCULOSKELETAL:  No edema; No deformity  SKIN: Warm and dry LOWER EXTREMITIES: no swelling NEUROLOGIC:  Alert and oriented x 3 PSYCHIATRIC:  Normal affect   ASSESSMENT:    1. Atypical chest pain   2. Mixed  hyperlipidemia   3. Idiopathic hypotension   4. Peripheral venous insufficiency    PLAN:    In order of problems listed above:  Chest pain all cardiac work-up negative coronary CT angio show no evidence of any coronary artery disease and overall coronary CT angio is got very high negative predictive value therefore I think we can drop issue of active coronary artery disease.  I think her symptoms are related to GI and she is scheduled to see GI doctor. Orthostatic hypotension I will give her prescription for midodrine 2.5 every 6 hours as needed.  Increasing fluid intake and salt intake will be leading to increased swelling which is contradictory to what she needs. Peripheral venous insufficiency.  Stable now when she takes diuretic.   Medication Adjustments/Labs and Tests Ordered: Current medicines are reviewed at length with the patient today.  Concerns regarding medicines are outlined above.  No orders of the defined types were placed in this encounter.  Medication changes: No orders of the defined types were placed in this encounter.   Signed, Park Liter, MD, [facility] 12/02/2021 10:49 AM    [place]

## 2021-12-04 ENCOUNTER — Ambulatory Visit: Payer: BC Managed Care – PPO | Admitting: Cardiology

## 2021-12-09 ENCOUNTER — Other Ambulatory Visit: Payer: Self-pay

## 2021-12-09 ENCOUNTER — Encounter: Payer: Self-pay | Admitting: Physician Assistant

## 2021-12-09 ENCOUNTER — Ambulatory Visit (INDEPENDENT_AMBULATORY_CARE_PROVIDER_SITE_OTHER): Payer: BC Managed Care – PPO | Admitting: Physician Assistant

## 2021-12-09 DIAGNOSIS — M7061 Trochanteric bursitis, right hip: Secondary | ICD-10-CM

## 2021-12-09 DIAGNOSIS — M7062 Trochanteric bursitis, left hip: Secondary | ICD-10-CM

## 2021-12-09 NOTE — Progress Notes (Signed)
° °  Procedure Note  Patient: Jordan Russell             Date of Birth: 25-Dec-1970           MRN: [id number]             Visit Date: 12/09/2021 HPI: Patient is well-known to Dr. Delilah Shan service comes in today with bilateral hip pain.  She has chronic bilateral hip bursitis.  She is requesting injections both hips.  She denies any radicular symptoms down either leg.  She states the last injections for help for about 3 months.  She denies any groin pain.  She is nondiabetic.  Review of systems: No fevers chills no recent vaccines in the last 2 weeks.  Physical exam: Bilateral hips good range of motion both hips.  Tenderness over the trochanteric region both hips.  Ambulates without any assistive device. Procedures: Visit Diagnoses:  1. Trochanteric bursitis of left hip   2. Trochanteric bursitis, right hip     Large Joint Inj: bilateral greater trochanter on 12/09/2021 5:16 PM Indications: pain Details: 22 G 1.5 in needle, lateral approach  Arthrogram: No  Outcome: tolerated well, no immediate complications Procedure, treatment alternatives, risks and benefits explained, specific risks discussed. Consent was given by the patient. Immediately prior to procedure a time out was called to verify the correct patient, procedure, equipment, support staff and site/side marked as required. Patient was prepped and draped in the usual sterile fashion.   Plan: She will continue to work on IT band stretching.  Follow-up with [place] as needed.  Knows to wait at least 3 months between injections.

## 2022-01-04 DIAGNOSIS — Z5181 Encounter for therapeutic drug level monitoring: Secondary | ICD-10-CM | POA: Diagnosis not present

## 2022-02-09 DIAGNOSIS — F410 Panic disorder [episodic paroxysmal anxiety] without agoraphobia: Secondary | ICD-10-CM | POA: Diagnosis not present

## 2022-02-09 DIAGNOSIS — F3181 Bipolar II disorder: Secondary | ICD-10-CM | POA: Diagnosis not present

## 2022-02-09 DIAGNOSIS — F5101 Primary insomnia: Secondary | ICD-10-CM | POA: Diagnosis not present

## 2022-02-19 DIAGNOSIS — U099 Post covid-19 condition, unspecified: Secondary | ICD-10-CM | POA: Diagnosis not present

## 2022-02-19 DIAGNOSIS — R519 Headache, unspecified: Secondary | ICD-10-CM | POA: Diagnosis not present

## 2022-02-19 DIAGNOSIS — J019 Acute sinusitis, unspecified: Secondary | ICD-10-CM | POA: Diagnosis not present

## 2022-03-01 DIAGNOSIS — N200 Calculus of kidney: Secondary | ICD-10-CM | POA: Diagnosis not present

## 2022-03-02 ENCOUNTER — Other Ambulatory Visit: Payer: Self-pay | Admitting: Internal Medicine

## 2022-03-02 DIAGNOSIS — R748 Abnormal levels of other serum enzymes: Secondary | ICD-10-CM | POA: Diagnosis not present

## 2022-03-02 DIAGNOSIS — I959 Hypotension, unspecified: Secondary | ICD-10-CM | POA: Diagnosis not present

## 2022-03-02 DIAGNOSIS — R58 Hemorrhage, not elsewhere classified: Secondary | ICD-10-CM | POA: Diagnosis not present

## 2022-03-02 DIAGNOSIS — R410 Disorientation, unspecified: Secondary | ICD-10-CM | POA: Diagnosis not present

## 2022-03-02 DIAGNOSIS — L299 Pruritus, unspecified: Secondary | ICD-10-CM | POA: Diagnosis not present

## 2022-03-02 DIAGNOSIS — R112 Nausea with vomiting, unspecified: Secondary | ICD-10-CM | POA: Diagnosis not present

## 2022-03-02 DIAGNOSIS — N200 Calculus of kidney: Secondary | ICD-10-CM | POA: Diagnosis not present

## 2022-03-03 DIAGNOSIS — N200 Calculus of kidney: Secondary | ICD-10-CM | POA: Diagnosis not present

## 2022-03-03 DIAGNOSIS — Z9049 Acquired absence of other specified parts of digestive tract: Secondary | ICD-10-CM | POA: Diagnosis not present

## 2022-03-03 DIAGNOSIS — R748 Abnormal levels of other serum enzymes: Secondary | ICD-10-CM | POA: Diagnosis not present

## 2022-03-11 DIAGNOSIS — R748 Abnormal levels of other serum enzymes: Secondary | ICD-10-CM | POA: Diagnosis not present

## 2022-03-11 DIAGNOSIS — E538 Deficiency of other specified B group vitamins: Secondary | ICD-10-CM | POA: Diagnosis not present

## 2022-03-19 DIAGNOSIS — F410 Panic disorder [episodic paroxysmal anxiety] without agoraphobia: Secondary | ICD-10-CM | POA: Diagnosis not present

## 2022-03-19 DIAGNOSIS — F3181 Bipolar II disorder: Secondary | ICD-10-CM | POA: Diagnosis not present

## 2022-03-19 DIAGNOSIS — F5101 Primary insomnia: Secondary | ICD-10-CM | POA: Diagnosis not present

## 2022-03-19 DIAGNOSIS — F3132 Bipolar disorder, current episode depressed, moderate: Secondary | ICD-10-CM | POA: Diagnosis not present

## 2022-03-23 ENCOUNTER — Other Ambulatory Visit: Payer: Self-pay | Admitting: Cardiology

## 2022-03-23 ENCOUNTER — Other Ambulatory Visit: Payer: Self-pay | Admitting: Internal Medicine

## 2022-03-23 DIAGNOSIS — R109 Unspecified abdominal pain: Secondary | ICD-10-CM

## 2022-03-23 DIAGNOSIS — Z79899 Other long term (current) drug therapy: Secondary | ICD-10-CM | POA: Diagnosis not present

## 2022-03-23 DIAGNOSIS — K5909 Other constipation: Secondary | ICD-10-CM | POA: Diagnosis not present

## 2022-03-24 ENCOUNTER — Ambulatory Visit: Payer: BC Managed Care – PPO | Admitting: Cardiology

## 2022-03-25 ENCOUNTER — Ambulatory Visit: Payer: BC Managed Care – PPO | Admitting: Cardiology

## 2022-04-02 DIAGNOSIS — R1031 Right lower quadrant pain: Secondary | ICD-10-CM | POA: Diagnosis not present

## 2022-04-02 DIAGNOSIS — K5904 Chronic idiopathic constipation: Secondary | ICD-10-CM | POA: Diagnosis not present

## 2022-04-02 DIAGNOSIS — K719 Toxic liver disease, unspecified: Secondary | ICD-10-CM | POA: Diagnosis not present

## 2022-04-06 ENCOUNTER — Ambulatory Visit
Admission: RE | Admit: 2022-04-06 | Discharge: 2022-04-06 | Disposition: A | Discharge status: Home / Self Care | Payer: BC Managed Care – PPO | Source: Ambulatory Visit | Attending: Internal Medicine | Admitting: Internal Medicine

## 2022-04-06 DIAGNOSIS — K429 Umbilical hernia without obstruction or gangrene: Secondary | ICD-10-CM | POA: Diagnosis not present

## 2022-04-06 DIAGNOSIS — Z9889 Other specified postprocedural states: Secondary | ICD-10-CM | POA: Diagnosis not present

## 2022-04-06 DIAGNOSIS — I700 Atherosclerosis of aorta: Secondary | ICD-10-CM | POA: Diagnosis not present

## 2022-04-06 DIAGNOSIS — M419 Scoliosis, unspecified: Secondary | ICD-10-CM | POA: Diagnosis not present

## 2022-04-06 DIAGNOSIS — R109 Unspecified abdominal pain: Secondary | ICD-10-CM

## 2022-04-26 ENCOUNTER — Other Ambulatory Visit: Payer: Self-pay | Admitting: Internal Medicine

## 2022-04-26 ENCOUNTER — Ambulatory Visit
Admission: RE | Admit: 2022-04-26 | Discharge: 2022-04-26 | Disposition: A | Discharge status: Home / Self Care | Payer: BC Managed Care – PPO | Source: Ambulatory Visit | Attending: Internal Medicine | Admitting: Internal Medicine

## 2022-04-26 DIAGNOSIS — M5441 Lumbago with sciatica, right side: Secondary | ICD-10-CM | POA: Diagnosis not present

## 2022-04-26 DIAGNOSIS — Z79899 Other long term (current) drug therapy: Secondary | ICD-10-CM | POA: Diagnosis not present

## 2022-04-26 DIAGNOSIS — M26609 Unspecified temporomandibular joint disorder, unspecified side: Secondary | ICD-10-CM | POA: Diagnosis not present

## 2022-04-26 DIAGNOSIS — R748 Abnormal levels of other serum enzymes: Secondary | ICD-10-CM | POA: Diagnosis not present

## 2022-04-26 DIAGNOSIS — G629 Polyneuropathy, unspecified: Secondary | ICD-10-CM | POA: Diagnosis not present

## 2022-04-26 DIAGNOSIS — M4126 Other idiopathic scoliosis, lumbar region: Secondary | ICD-10-CM | POA: Diagnosis not present

## 2022-04-26 DIAGNOSIS — M545 Low back pain, unspecified: Secondary | ICD-10-CM | POA: Diagnosis not present

## 2022-05-03 DIAGNOSIS — F3181 Bipolar II disorder: Secondary | ICD-10-CM | POA: Diagnosis not present

## 2022-05-03 DIAGNOSIS — F410 Panic disorder [episodic paroxysmal anxiety] without agoraphobia: Secondary | ICD-10-CM | POA: Diagnosis not present

## 2022-05-03 DIAGNOSIS — F5101 Primary insomnia: Secondary | ICD-10-CM | POA: Diagnosis not present

## 2022-05-25 ENCOUNTER — Other Ambulatory Visit: Payer: Self-pay | Admitting: Internal Medicine

## 2022-05-25 ENCOUNTER — Ambulatory Visit
Admission: RE | Admit: 2022-05-25 | Discharge: 2022-05-25 | Disposition: A | Discharge status: Home / Self Care | Payer: BC Managed Care – PPO | Source: Ambulatory Visit | Attending: Internal Medicine | Admitting: Internal Medicine

## 2022-05-25 DIAGNOSIS — S99921A Unspecified injury of right foot, initial encounter: Secondary | ICD-10-CM

## 2022-05-25 DIAGNOSIS — M79671 Pain in right foot: Secondary | ICD-10-CM | POA: Diagnosis not present

## 2022-06-07 ENCOUNTER — Encounter: Payer: Self-pay | Admitting: Physician Assistant

## 2022-06-07 ENCOUNTER — Ambulatory Visit (INDEPENDENT_AMBULATORY_CARE_PROVIDER_SITE_OTHER): Payer: BC Managed Care – PPO | Admitting: Physician Assistant

## 2022-06-07 DIAGNOSIS — M7062 Trochanteric bursitis, left hip: Secondary | ICD-10-CM | POA: Diagnosis not present

## 2022-06-07 DIAGNOSIS — M7061 Trochanteric bursitis, right hip: Secondary | ICD-10-CM | POA: Diagnosis not present

## 2022-06-07 MED ORDER — GABAPENTIN 100 MG PO CAPS: 100.0000 mg | ORAL_CAPSULE | Freq: Three times a day (TID) | ORAL | 0 refills | Status: DC

## 2022-06-07 MED ORDER — METHYLPREDNISOLONE ACETATE 40 MG/ML IJ SUSP
40.0000 mg | INTRAMUSCULAR | Status: AC | PRN
  Administered 2022-06-07: 40 mg via INTRA_ARTICULAR

## 2022-06-07 MED ORDER — LIDOCAINE HCL 1 % IJ SOLN
3.0000 mL | INTRAMUSCULAR | Status: AC | PRN
  Administered 2022-06-07: 3 mL

## 2022-06-07 NOTE — Progress Notes (Signed)
   Procedure Note  Patient: Jordan Russell             Date of Birth: [birth year]           MRN: [id number]             Visit Date: 06/07/2022 HPI: Jordan Russell returns today requesting bilateral choke injections.  She had injections for trochanteric bursitis both hips on 12/09/2021.  She states her pain returned about 3 to 4 weeks ago.  She is had no particular injury to either hip.  She has had no fevers or chills.  She is also asking about taking gabapentin as this has helped her pain in the past she does have neuropathy and falls occasionally due to neuropathy in feet.  She is nondiabetic.  Denies any numbness tingling down either leg.  Physical exam: General well-developed well-nourished female no acute distress.   Bilateral hips: Good range of motion without pain.  Tenderness over the trochanteric region both hips.  Procedures: Visit Diagnoses:  1. Trochanteric bursitis of left hip   2. Trochanteric bursitis, right hip     Large Joint Inj: bilateral greater trochanter on 06/07/2022 5:18 PM Indications: pain Details: 22 G 1.5 in needle, lateral approach  Arthrogram: No  Medications (Right): 3 mL lidocaine 1 %; 40 mg methylPREDNISolone acetate 40 MG/ML Medications (Left): 3 mL lidocaine 1 %; 40 mg methylPREDNISolone acetate 40 MG/ML Outcome: tolerated well, no immediate complications Procedure, treatment alternatives, risks and benefits explained, specific risks discussed. Consent was given by the patient. Immediately prior to procedure a time out was called to verify the correct patient, procedure, equipment, support staff and site/side marked as required. Patient was prepped and draped in the usual sterile fashion.      Plan: She knows to wait at least 3 months between stroke injections.  Did refill her gabapentin at her request.  In the future she will get this from her primary care physician.  Questions were encouraged and answered at length.

## 2022-06-21 ENCOUNTER — Encounter: Payer: BC Managed Care – PPO | Admitting: Psychology

## 2022-07-12 DIAGNOSIS — G43919 Migraine, unspecified, intractable, without status migrainosus: Secondary | ICD-10-CM | POA: Diagnosis not present

## 2022-07-12 DIAGNOSIS — R5383 Other fatigue: Secondary | ICD-10-CM | POA: Diagnosis not present

## 2022-07-12 DIAGNOSIS — Z8719 Personal history of other diseases of the digestive system: Secondary | ICD-10-CM | POA: Diagnosis not present

## 2022-07-12 DIAGNOSIS — G629 Polyneuropathy, unspecified: Secondary | ICD-10-CM | POA: Diagnosis not present

## 2022-07-13 ENCOUNTER — Other Ambulatory Visit: Payer: Self-pay

## 2022-07-13 ENCOUNTER — Emergency Department (HOSPITAL_COMMUNITY)
Admission: EM | Admit: 2022-07-13 | Discharge: 2022-07-14 | Disposition: A | Discharge status: Home / Self Care | Payer: BC Managed Care – PPO | Attending: Emergency Medicine | Admitting: Emergency Medicine

## 2022-07-13 ENCOUNTER — Emergency Department (HOSPITAL_COMMUNITY): Payer: BC Managed Care – PPO

## 2022-07-13 ENCOUNTER — Encounter (HOSPITAL_COMMUNITY): Payer: Self-pay

## 2022-07-13 DIAGNOSIS — R0602 Shortness of breath: Secondary | ICD-10-CM | POA: Insufficient documentation

## 2022-07-13 DIAGNOSIS — R140 Abdominal distension (gaseous): Secondary | ICD-10-CM | POA: Insufficient documentation

## 2022-07-13 DIAGNOSIS — N189 Chronic kidney disease, unspecified: Secondary | ICD-10-CM | POA: Insufficient documentation

## 2022-07-13 DIAGNOSIS — R5383 Other fatigue: Secondary | ICD-10-CM | POA: Diagnosis not present

## 2022-07-13 DIAGNOSIS — R42 Dizziness and giddiness: Secondary | ICD-10-CM | POA: Insufficient documentation

## 2022-07-13 DIAGNOSIS — R1084 Generalized abdominal pain: Secondary | ICD-10-CM | POA: Insufficient documentation

## 2022-07-13 DIAGNOSIS — R29818 Other symptoms and signs involving the nervous system: Secondary | ICD-10-CM | POA: Diagnosis not present

## 2022-07-13 DIAGNOSIS — Z90710 Acquired absence of both cervix and uterus: Secondary | ICD-10-CM | POA: Diagnosis not present

## 2022-07-13 DIAGNOSIS — K5900 Constipation, unspecified: Secondary | ICD-10-CM | POA: Insufficient documentation

## 2022-07-13 DIAGNOSIS — Z79899 Other long term (current) drug therapy: Secondary | ICD-10-CM | POA: Insufficient documentation

## 2022-07-13 DIAGNOSIS — R918 Other nonspecific abnormal finding of lung field: Secondary | ICD-10-CM | POA: Diagnosis not present

## 2022-07-13 DIAGNOSIS — R079 Chest pain, unspecified: Secondary | ICD-10-CM | POA: Diagnosis not present

## 2022-07-13 DIAGNOSIS — R109 Unspecified abdominal pain: Secondary | ICD-10-CM | POA: Diagnosis not present

## 2022-07-13 DIAGNOSIS — R55 Syncope and collapse: Secondary | ICD-10-CM | POA: Diagnosis not present

## 2022-07-13 DIAGNOSIS — R0600 Dyspnea, unspecified: Secondary | ICD-10-CM | POA: Diagnosis not present

## 2022-07-13 DIAGNOSIS — K6389 Other specified diseases of intestine: Secondary | ICD-10-CM | POA: Diagnosis not present

## 2022-07-13 DIAGNOSIS — Z9049 Acquired absence of other specified parts of digestive tract: Secondary | ICD-10-CM | POA: Diagnosis not present

## 2022-07-13 DIAGNOSIS — K3189 Other diseases of stomach and duodenum: Secondary | ICD-10-CM | POA: Diagnosis not present

## 2022-07-13 LAB — URINALYSIS, ROUTINE W REFLEX MICROSCOPIC
Bacteria, UA: NONE SEEN
Bilirubin Urine: NEGATIVE
Glucose, UA: NEGATIVE mg/dL
Hgb urine dipstick: NEGATIVE
Ketones, ur: NEGATIVE mg/dL
Nitrite: NEGATIVE
Protein, ur: NEGATIVE mg/dL
Specific Gravity, Urine: 1.028 (ref 1.005–1.030)
pH: 5 (ref 5.0–8.0)

## 2022-07-13 LAB — CBC WITH DIFFERENTIAL/PLATELET
Abs Immature Granulocytes: 0.01 10*3/uL (ref 0.00–0.07)
Basophils Absolute: 0.1 10*3/uL (ref 0.0–0.1)
Basophils Relative: 1 %
Eosinophils Absolute: 0.2 10*3/uL (ref 0.0–0.5)
Eosinophils Relative: 3 %
HCT: 42.2 % (ref 36.0–46.0)
Hemoglobin: 14.1 g/dL (ref 12.0–15.0)
Immature Granulocytes: 0 %
Lymphocytes Relative: 35 %
Lymphs Abs: 2.1 10*3/uL (ref 0.7–4.0)
MCH: 33.5 pg (ref 26.0–34.0)
MCHC: 33.4 g/dL (ref 30.0–36.0)
MCV: 100.2 fL — ABNORMAL HIGH (ref 80.0–100.0)
Monocytes Absolute: 0.4 10*3/uL (ref 0.1–1.0)
Monocytes Relative: 7 %
Neutro Abs: 3.3 10*3/uL (ref 1.7–7.7)
Neutrophils Relative %: 54 %
Platelets: 240 10*3/uL (ref 150–400)
RBC: 4.21 MIL/uL (ref 3.87–5.11)
RDW: 13.2 % (ref 11.5–15.5)
WBC: 6.2 10*3/uL (ref 4.0–10.5)
nRBC: 0 % (ref 0.0–0.2)

## 2022-07-13 LAB — COMPREHENSIVE METABOLIC PANEL
ALT: 20 U/L (ref 0–44)
AST: 20 U/L (ref 15–41)
Albumin: 4.1 g/dL (ref 3.5–5.0)
Alkaline Phosphatase: 173 U/L — ABNORMAL HIGH (ref 38–126)
Anion gap: 7 (ref 5–15)
BUN: 17 mg/dL (ref 6–20)
CO2: 22 mmol/L (ref 22–32)
Calcium: 9.2 mg/dL (ref 8.9–10.3)
Chloride: 110 mmol/L (ref 98–111)
Creatinine, Ser: 0.91 mg/dL (ref 0.44–1.00)
GFR, Estimated: >60 mL/min (ref >60)
Glucose, Bld: 103 mg/dL — ABNORMAL HIGH (ref 70–99)
Potassium: 4.1 mmol/L (ref 3.5–5.1)
Sodium: 139 mmol/L (ref 135–145)
Total Bilirubin: 0.5 mg/dL (ref 0.3–1.2)
Total Protein: 7 g/dL (ref 6.5–8.1)

## 2022-07-13 LAB — TROPONIN I (HIGH SENSITIVITY)
Troponin I (High Sensitivity): 2 ng/L (ref <18)
Troponin I (High Sensitivity): <2 ng/L (ref <18)

## 2022-07-13 LAB — LIPASE, BLOOD: Lipase: 24 U/L (ref 11–51)

## 2022-07-13 LAB — AMMONIA: Ammonia: 22 umol/L (ref 9–35)

## 2022-07-13 MED ORDER — IOHEXOL 300 MG/ML  SOLN: 100.0000 mL | Freq: Once | INTRAMUSCULAR | Status: DC | PRN

## 2022-07-13 MED ORDER — METAMUCIL 0.52 G PO CAPS: 0.5200 g | ORAL_CAPSULE | Freq: Every day | ORAL | 2 refills | Status: DC

## 2022-07-13 MED ORDER — SODIUM CHLORIDE (PF) 0.9 % IJ SOLN
INTRAMUSCULAR | Status: AC
  Filled 2022-07-13: qty 50

## 2022-07-13 MED ORDER — GLYCERIN (ADULT) 2 G RE SUPP: 1.0000 | RECTAL | 0 refills | Status: DC | PRN

## 2022-07-13 MED ORDER — MAGNESIUM CITRATE PO SOLN: 1.0000 | Freq: Once | ORAL | 0 refills | Status: AC

## 2022-07-13 MED ORDER — POLYETHYLENE GLYCOL 3350 17 G PO PACK: 17.0000 g | PACK | Freq: Every day | ORAL | 0 refills | Status: DC

## 2022-07-13 MED ORDER — IOHEXOL 300 MG/ML  SOLN
100.0000 mL | Freq: Once | INTRAMUSCULAR | Status: AC | PRN
  Administered 2022-07-13: 100 mL via INTRAVENOUS

## 2022-07-13 NOTE — ED Notes (Signed)
Needs IV for CT  

## 2022-07-13 NOTE — Discharge Instructions (Addendum)
There are medications that were sent to your pharmacy to treat constipation. -MiraLAX is a stool softener.  For your initial dose, take 6 to 8 packets mixed in a large drink.  For daily maintenance following bowel cleanout, take 1 packet daily.  Adjust daily dose to maintain daily soft stool. -Magnesium citrate can be taken for bowel cleanout as well. -Psyllium is a fiber supplement.  This can be taken daily to maintain daily soft bulky stools.  When taking, ensure that you stay well-hydrated. -The glycerin suppository is to be taken only as needed if oral regimen is ineffective.  Follow-up with your primary care doctor for ongoing management of your chronic conditions.  Return to the emergency department at any time for any new or worsening symptoms of concern.

## 2022-07-13 NOTE — ED Provider Notes (Signed)
[facility] DEPT Provider Note   CSN: [id number] Arrival date & time: 07/13/22  1857     History  Chief Complaint  Patient presents with   Dizziness   Abdominal Pain    Jordan Russell is a 51 y.o. female.   Dizziness Associated symptoms: shortness of breath   Abdominal Pain Associated symptoms: fatigue and shortness of breath   Patient presents for multiple complaints.  Medical history includes bipolar disorder, IBS, GAD, GERD, fibromyalgia, migraines, depression, CKD, HLD.  She states that she had liver failure in 2019.  She is concerned about a recurrence of this.  She has had abdominal swelling and is concerned that she has had significant weight gain in a short amount of time.  She is worried that this may be from ascites.  She endorses a generalized abdominal pain.  This is her main concern.  Patient states that, although she does have a history of chronic constipation, she has been having bowel movements lately.  Currently she takes Linzess but is not on any other stool regimen.  In addition to her abdominal distention and discomfort, she has had shortness of breath which she attributes to abdominal distention.  She has had several syncopal episodes over the past several weeks.  She has felt fatigued.  Per chart review, her current weight is consistent with her weight earlier this year.  Patient also states that all of her clothes still fit.     Home Medications Prior to Admission medications   Medication Sig Start Date End Date Taking? Authorizing Provider  glycerin adult 2 g suppository Place 1 suppository rectally as needed for constipation. 07/13/22  Yes Godfrey Pick, MD  magnesium citrate SOLN Take 296 mLs (1 Bottle total) by mouth once for 1 dose. 07/14/22 07/14/22 Yes Godfrey Pick, MD  polyethylene glycol (MIRALAX / GLYCOLAX) 17 g packet Take 17 g by mouth daily. For bowel cleanout, mix 6-8 packets in a large Gatorade or similar drink.  07/13/22  Yes Godfrey Pick, MD  psyllium (METAMUCIL) 0.52 g capsule Take 1 capsule (0.52 g total) by mouth daily. 07/13/22  Yes Godfrey Pick, MD  ALPRAZolam Duanne Moron) 0.5 MG tablet Take 0.5 mg by mouth 3 (three) times daily as needed for anxiety. 11/19/18   [provider]  ALPRAZolam Duanne Moron) 1 MG tablet Take 1 mg by mouth 3 (three) times daily as needed. 06/30/22   [provider]  Carboxymeth-Glyc-Polysorb PF (REFRESH OPTIVE MEGA-3) 0.5-1-0.5 % SOLN Place 1 drop into both eyes 3 (three) times daily as needed (dry/irritated eyes.).    [provider]  dicyclomine (BENTYL) 20 MG tablet Take 1 tablet (20 mg total) by mouth 3 (three) times daily before meals. 08/16/21   Palumbo, April, MD  escitalopram (LEXAPRO) 20 MG tablet Take 20 mg by mouth in the morning. 02/10/21   [provider]  esomeprazole (NEXIUM) 40 MG capsule Take 40 mg by mouth 2 (two) times daily. 02/10/21   [provider]  folic acid (FOLVITE) 1 MG tablet Take 1 mg by mouth in the morning. 02/04/21   [provider]  furosemide (LASIX) 20 MG tablet Take 20 mg by mouth in the morning. 02/08/19   [provider]  gabapentin (NEURONTIN) 100 MG capsule Take 1 capsule (100 mg total) by mouth 3 (three) times daily. 06/07/22   Pete Pelt, PA-C  lactulose (CHRONULAC) 10 GM/15ML solution Place 45 mLs (30 g total) into feeding tube every 6 (six) hours. Patient taking differently:  Place 30 g into feeding tube daily as needed for mild constipation, moderate constipation or severe constipation (constipation). 06/09/18   Shelly Coss, MD  levothyroxine (SYNTHROID) 88 MCG tablet Take 88 mcg by mouth daily before breakfast. 12/19/20   [provider]  LINZESS 290 MCG CAPS capsule Take 290 mcg by mouth daily before breakfast. 12/01/20   [provider]  lithium 300 MG tablet Take 300 mg by mouth 2 (two) times daily. 02/01/19   [provider]  losartan-hydrochlorothiazide  (HYZAAR) 100-25 MG tablet Take 1 tablet by mouth daily. 04/18/22   [provider]  lurasidone (LATUDA) 20 MG TABS tablet Take 20 mg by mouth daily.    [provider]  midodrine (PROAMATINE) 2.5 MG tablet Take 1 tablet (2.5 mg total) by mouth every 6 (six) hours as needed (low b/p). 03/23/22   Park Liter, MD  ondansetron (ZOFRAN-ODT) 8 MG disintegrating tablet Take 1 tablet (8 mg total) by mouth every 6 (six) hours as needed for nausea. 08/11/21   Greer Pickerel, MD  QUEtiapine (SEROQUEL) 300 MG tablet Take 300 mg by mouth at bedtime.    [provider]  rifaximin (XIFAXAN) 550 MG TABS tablet Take 550 mg by mouth 2 (two) times daily.    [provider]  rizatriptan (MAXALT) 10 MG tablet Take 10 mg by mouth every 2 (two) hours as needed for migraine. 11/10/20   [provider]  spironolactone (ALDACTONE) 50 MG tablet Take 50 mg by mouth in the morning. 06/23/18   [provider]  topiramate (TOPAMAX) 25 MG tablet Take 25 mg by mouth 2 (two) times daily. 02/20/21   [provider]  VRAYLAR 1.5 MG capsule Take 1.5 mg by mouth. 06/30/22   [provider]      Allergies    Gabapentin, Sulfa antibiotics, Sulfasalazine, Lamictal [lamotrigine], Lithium, Penicillins, Tylenol [acetaminophen], Morphine and related, and Sulfur    Review of Systems   Review of Systems  Constitutional:  Positive for fatigue.  Respiratory:  Positive for shortness of breath.   Gastrointestinal:  Positive for abdominal distention and abdominal pain.  Neurological:  Positive for dizziness and light-headedness.  All other systems reviewed and are negative.   Physical Exam Updated Vital Signs BP 102/72 (BP Location: Left Arm)   Pulse 70   Temp 97.9 F (36.6 C) (Oral)   Resp 18   Ht '5\' 6"'$  (1.676 m)   Wt 79.4 kg   SpO2 99%   BMI 28.25 kg/m  Physical Exam Vitals and nursing note reviewed.  Constitutional:      General: She is not in acute  distress.    Appearance: She is well-developed. She is not ill-appearing, toxic-appearing or diaphoretic.  HENT:     Head: Normocephalic and atraumatic.     Mouth/Throat:     Mouth: Mucous membranes are moist.     Pharynx: Oropharynx is clear.  Eyes:     General: No scleral icterus.    Extraocular Movements: Extraocular movements intact.     Conjunctiva/sclera: Conjunctivae normal.  Cardiovascular:     Rate and Rhythm: Normal rate and regular rhythm.     Heart sounds: No murmur heard. Pulmonary:     Effort: Pulmonary effort is normal. No respiratory distress.     Breath sounds: Normal breath sounds. No wheezing, rhonchi or rales.  Abdominal:     General: There is distension.     Palpations: Abdomen is soft. There is no fluid wave.  Tenderness: There is no abdominal tenderness. There is no guarding or rebound.  Musculoskeletal:        General: No swelling.     Cervical back: Neck supple.  Skin:    General: Skin is warm and dry.     Capillary Refill: Capillary refill takes less than 2 seconds.     Coloration: Skin is not cyanotic or jaundiced.  Neurological:     General: No focal deficit present.     Mental Status: She is alert and oriented to person, place, and time.  Psychiatric:        Mood and Affect: Mood normal.        Behavior: Behavior normal.     ED Results / Procedures / Treatments   Labs (all labs ordered are listed, but only abnormal results are displayed) Labs Reviewed  CBC WITH DIFFERENTIAL/PLATELET - Abnormal; Notable for the following components:      Result Value   MCV 100.2 (*)    All other components within normal limits  COMPREHENSIVE METABOLIC PANEL - Abnormal; Notable for the following components:   Glucose, Bld 103 (*)    Alkaline Phosphatase 173 (*)    All other components within normal limits  URINALYSIS, ROUTINE W REFLEX MICROSCOPIC - Abnormal; Notable for the following components:   Leukocytes,Ua TRACE (*)    All other components within  normal limits  LIPASE, BLOOD  AMMONIA  TROPONIN I (HIGH SENSITIVITY)  TROPONIN I (HIGH SENSITIVITY)    EKG None  Radiology CT CHEST ABDOMEN PELVIS W CONTRAST  Result Date: 07/13/2022 CLINICAL DATA:  Abdominal distention; dyspnea; chest pain EXAM: CT CHEST, ABDOMEN, AND PELVIS WITH CONTRAST TECHNIQUE: Multidetector CT imaging of the chest, abdomen and pelvis was performed following the standard protocol during bolus administration of intravenous contrast. RADIATION DOSE REDUCTION: This exam was performed according to the departmental dose-optimization program which includes automated exposure control, adjustment of the mA and/or kV according to patient size and/or use of iterative reconstruction technique. CONTRAST:  135m OMNIPAQUE IOHEXOL 300 MG/ML  SOLN COMPARISON:  CT 04/06/2022 and 05/25/2021 FINDINGS: CT CHEST FINDINGS Cardiovascular: No significant vascular findings. Normal heart size. No pericardial effusion. Mediastinum/Nodes: Fluid is present throughout the thoracic esophagus. No thoracic adenopathy. Unremarkable trachea and thyroid. Lungs/Pleura: Lungs are clear. No pleural effusion or pneumothorax. 5 mm nodule in the lower right upper lobe (series 5/72). Musculoskeletal: No chest wall mass or suspicious bone lesions identified. CT ABDOMEN PELVIS FINDINGS Hepatobiliary: No focal liver abnormality is seen. Status post cholecystectomy. No biliary dilatation. Pancreas: Unremarkable. No pancreatic ductal dilatation or surrounding inflammatory changes. Spleen: Normal in size without focal abnormality. Adrenals/Urinary Tract: Adrenal glands are unremarkable. Kidneys are normal, without renal calculi, focal lesion, or hydronephrosis. Bladder is unremarkable. Stomach/Bowel: Postoperative changes of Roux-en-Y gastric bypass. Increased distention of the gastric pouch with possible communication with the excluded stomach (see series 3/image [id number] and series 7/image 168). Normal caliber large and small  bowel. Normal appendix. Large colonic stool load. Vascular/Lymphatic: No significant vascular findings are present. No enlarged abdominal or pelvic lymph nodes. Reproductive: Status post hysterectomy. No adnexal masses. Other: No free intraperitoneal fluid or air. Musculoskeletal: No acute or significant osseous findings. IMPRESSION: Frothy fluid is present throughout the intrathoracic esophagus reaching the level of the gastric inlet. Possibly related to reflux. Postoperative changes of Roux-en-Y gastric bypass. Increased size of the gastric pouch since 04/06/2022. The gastric pouch closely abuts the excluded stomach and gastrogastric fistula is not excluded. Consider nonemergent upper GI fluoroscopic exam for  further evaluation. Large colonic stool load. Recommend clinical correlation for constipation. 5 mm nodule in the right upper lobe. No follow-up needed if patient is low-risk (and has no known or suspected primary neoplasm). Non-contrast chest CT can be considered in 12 months if patient is high-risk. This recommendation follows the consensus statement: Guidelines for Management of Incidental Pulmonary Nodules Detected on CT Images: From the Fleischner Society 2017; Radiology 2017; 284:228-243. Electronically Signed   By: Placido Sou M.D.   On: 07/13/2022 22:05   CT Head Wo Contrast  Result Date: 07/13/2022 CLINICAL DATA:  Neuro deficit EXAM: CT HEAD WITHOUT CONTRAST TECHNIQUE: Contiguous axial images were obtained from the base of the skull through the vertex without intravenous contrast. RADIATION DOSE REDUCTION: This exam was performed according to the departmental dose-optimization program which includes automated exposure control, adjustment of the mA and/or kV according to patient size and/or use of iterative reconstruction technique. COMPARISON:  MRI brain dated 09/14/2021 FINDINGS: Brain: No evidence of acute infarction, hemorrhage, hydrocephalus, extra-axial collection or mass lesion/mass  effect. Vascular: No hyperdense vessel or unexpected calcification. Skull: Normal. Negative for fracture or focal lesion. Sinuses/Orbits: The visualized paranasal sinuses are essentially clear. The mastoid air cells are unopacified. Other: None. IMPRESSION: Normal head CT. Electronically Signed   By: Julian Hy M.D.   On: 07/13/2022 21:46    Procedures Procedures    Medications Ordered in ED Medications  iohexol (OMNIPAQUE) 300 MG/ML solution 100 mL (has no administration in time range)  iohexol (OMNIPAQUE) 300 MG/ML solution 100 mL (100 mLs Intravenous Contrast Given 07/13/22 2140)  sodium chloride (PF) 0.9 % injection (  Given by Other 07/13/22 2227)    ED Course/ Medical Decision Making/ A&P                           Medical Decision Making  This patient presents to the ED for concern of abdominal distention and pain, this involves an extensive number of treatment options, and is a complaint that carries with it a high risk of complications and morbidity.  The differential diagnosis includes constipation, ascites, SBO, neoplasm, IBS flare   Co morbidities that complicate the patient evaluation  bipolar disorder, IBS, GAD, GERD, fibromyalgia, migraines, depression, CKD, HLD   Additional history obtained:  Additional history obtained from N/A External records from outside source obtained and reviewed including EMR   Lab Tests:  I Ordered, and personally interpreted labs.  The pertinent results include: Normal hemoglobin, no leukocytosis, normal kidney function, normal electrolytes, normal transaminases and normal bilirubin.  I will and phosphatase is mildly elevated, consistent with prior lab work.  Lipase, troponin, and ammonia are normal.  No evidence of UTI.   Imaging Studies ordered:  I ordered imaging studies including CT head, chest, abdomen, and pelvis I independently visualized and interpreted imaging which showed large stool burden with no other acute  findings I agree with the radiologist interpretation   Cardiac Monitoring: / EKG:  The patient was maintained on a cardiac monitor.  I personally viewed and interpreted the cardiac monitored which showed an underlying rhythm of: Sinus rhythm  Problem List / ED Course / Critical interventions / Medication management  Patient presents to the ED for multiple complaints.  Among these are multiple syncopal episodes, dizziness, generalized weakness, abdominal swelling, abdominal pain, weight gain, shortness of breath, lightheadedness.  Her weight today in the ED is 79.4 kg.  This is consistent with her weight during outpatient visits  earlier this year.  She initially stated that she gained 50 pounds in 6 weeks.  She subsequently stated that she gained 50 pounds in 2 weeks.  Prior to being bedded in the ED, thorough diagnostic work-up was initiated.  EKG shows normal sinus rhythm without any ST segment or T wave abnormalities.  Laboratory work-up is reassuring.  She has a normal hemoglobin and no leukocytosis.  Electrolytes are normal.  She has no elevation in lipase or troponin.  On CT imaging of head, there were no acute findings.  On CT imaging of chest, abdomen, and pelvis, she does have some frothy fluid in the esophagus consistent with reflux as well as a large colonic stool load.  Results were negative for other acute findings.  On assessment, patient is well-appearing.  Her breathing is unlabored.  Her vital signs are normal.  Her abdomen does appear mildly distended.  No fluid wave is present.  She is without tenderness.  Patient was informed of her reassuring work-up results as well as her evidence of constipation on CT scan.  Patient was counseled on bowel regimen for treatment of constipation as well as ongoing regimen to promote daily soft stools.  She was prescribed medications for treatment of her constipation at home.  She was advised to return to the ED for any worsening of symptoms.  She was  discharged in good condition.   Social Determinants of Health:  Has PCP        Final Clinical Impression(s) / ED Diagnoses Final diagnoses:  Constipation, unspecified constipation type    Rx / DC Orders ED Discharge Orders          Ordered    magnesium citrate SOLN   Once        07/13/22 2359    polyethylene glycol (MIRALAX / GLYCOLAX) 17 g packet  Daily        07/13/22 2359    psyllium (METAMUCIL) 0.52 g capsule  Daily        07/13/22 2359    glycerin adult 2 g suppository  As needed        07/13/22 2359              Godfrey Pick, MD 07/14/22 0000

## 2022-07-13 NOTE — ED Provider Triage Note (Signed)
Emergency Medicine Provider Triage Evaluation Note  Jordan Russell , a 51 y.o. female  was evaluated in triage.  Pt complains of multiple syncopal episodes, chest pain, shortness of breath, abdominal distention, 50 pound weight gain in 2 weeks.  Patient has a history of liver cirrhosis, as well as significant amount of other comorbidities.  Complaining of abdominal distention causing shortness of breath, also complaining of intermittent palpitations and chest pain.  Patient saw her primary care provider who wanted to schedule a CT, but patient did not want to wait that long.  Also complaining of generalized weakness, more so on the right side than left.  Review of Systems  Positive: As above Negative: Nausea, vomiting, diarrhea  Physical Exam  BP 120/76 (BP Location: Right Arm)   Pulse 79   Temp 98.1 F (36.7 C) (Oral)   Resp 18   Ht '5\' 6"'$  (1.676 m)   Wt 79.4 kg   SpO2 97%   BMI 28.25 kg/m  Gen:   Awake, no distress   Resp:  Normal effort  MSK:   Moves extremities without difficulty Other:  Ecchymosis noted to bilateral forearms, patient does have a tremor, asterixis negative.  No facial drooping or slurred speech.  Right side is slightly weaker than the left  Medical Decision Making  Medically screening exam initiated at 8:14 PM.  Appropriate orders placed.  Jordan Russell was informed that the remainder of the evaluation will be completed by another provider, this initial triage assessment does not replace that evaluation, and the importance of remaining in the ED until their evaluation is complete.  Patient reports significant increase in abdominal distention.  Will obtain CT chest abdomen pelvis due to abdominal distention, shortness of breath, and chest pain.  Also having unilateral weakness, will obtain head CT.   Roylene Reason, [place] 07/13/22 2018

## 2022-07-13 NOTE — ED Triage Notes (Signed)
Pt reports dizziness, weakness, blurred vision, abd swelling, shob, palpitations, lightheaded, 51 lb weight gain in 6 weeks. Pt reports +LOC x4 in 2 weeks.  Pt reports seeing PCP and ALT was elevated and outpatient CT was scheduled but couldn't wait that long. Pt talking in full sentences, no distress noted.  hx of End stage liver failure

## 2022-07-20 DIAGNOSIS — Z9889 Other specified postprocedural states: Secondary | ICD-10-CM | POA: Diagnosis not present

## 2022-07-20 DIAGNOSIS — R142 Eructation: Secondary | ICD-10-CM | POA: Diagnosis not present

## 2022-07-20 DIAGNOSIS — K5901 Slow transit constipation: Secondary | ICD-10-CM | POA: Diagnosis not present

## 2022-07-30 DIAGNOSIS — R1012 Left upper quadrant pain: Secondary | ICD-10-CM | POA: Diagnosis not present

## 2022-07-30 DIAGNOSIS — Z9889 Other specified postprocedural states: Secondary | ICD-10-CM | POA: Diagnosis not present

## 2022-07-30 DIAGNOSIS — Z9884 Bariatric surgery status: Secondary | ICD-10-CM | POA: Diagnosis not present

## 2022-07-30 DIAGNOSIS — Z8719 Personal history of other diseases of the digestive system: Secondary | ICD-10-CM | POA: Diagnosis not present

## 2022-08-03 ENCOUNTER — Other Ambulatory Visit: Payer: Self-pay | Admitting: General Surgery

## 2022-08-03 DIAGNOSIS — Z8719 Personal history of other diseases of the digestive system: Secondary | ICD-10-CM

## 2022-08-06 ENCOUNTER — Ambulatory Visit
Admission: RE | Admit: 2022-08-06 | Discharge: 2022-08-06 | Disposition: A | Discharge status: Home / Self Care | Payer: BC Managed Care – PPO | Source: Ambulatory Visit | Attending: General Surgery | Admitting: General Surgery

## 2022-08-06 ENCOUNTER — Other Ambulatory Visit: Payer: Self-pay | Admitting: General Surgery

## 2022-08-06 DIAGNOSIS — K219 Gastro-esophageal reflux disease without esophagitis: Secondary | ICD-10-CM | POA: Diagnosis not present

## 2022-08-06 DIAGNOSIS — K224 Dyskinesia of esophagus: Secondary | ICD-10-CM | POA: Diagnosis not present

## 2022-08-06 DIAGNOSIS — Z8719 Personal history of other diseases of the digestive system: Secondary | ICD-10-CM

## 2022-08-19 NOTE — Progress Notes (Signed)
Sent message, via epic in basket, requesting orders in epic from surgeon.  

## 2022-08-23 ENCOUNTER — Ambulatory Visit: Payer: BC Managed Care – PPO | Admitting: Neurology

## 2022-08-24 ENCOUNTER — Ambulatory Visit: Payer: Self-pay | Admitting: General Surgery

## 2022-08-24 DIAGNOSIS — Z9884 Bariatric surgery status: Secondary | ICD-10-CM

## 2022-08-24 NOTE — Patient Instructions (Signed)
SURGICAL WAITING ROOM VISITATION Patients having surgery or a procedure may have no more than 2 support people in the waiting area - these visitors may rotate in the visitor waiting room.   Children under the age of 42 must have an adult with them who is not the patient. If the patient needs to stay at the hospital during part of their recovery, the visitor guidelines for inpatient rooms apply.  PRE-OP VISITATION  Pre-op nurse will coordinate an appropriate time for 1 support person to accompany the patient in pre-op.  This support person may not rotate.  This visitor will be contacted when the time is appropriate for the visitor to come back in the pre-op area.  Please refer to the [facility] website for the visitor guidelines for Inpatients (after your surgery is over and you are in a regular room).  You are not required to quarantine at this time prior to your surgery. However, you must do this: Hand Hygiene often Do NOT share personal items Notify your provider if you are in close contact with someone who has COVID or you develop fever 100.4 or greater, new onset of sneezing, cough, sore throat, shortness of breath or body aches.  If you test positive for Covid or have been in contact with anyone that has tested positive in the last 10 days please notify you surgeon.    Your procedure is scheduled on:  Monday  August 30, 2022  Report to [facility] Main Entrance: [place] entrance where the [employer] is available.   Report to admitting at:08:45  AM  +++++Call this number if you have any questions or problems the morning of surgery [phone]  Do not eat food after Midnight the night prior to your surgery/procedure.  After Midnight you may have the following liquids until  08:00 AM DAY OF SURGERY  Clear Liquid Diet Water Black Coffee (sugar ok, NO MILK/CREAM OR CREAMERS)  Tea (sugar ok, NO MILK/CREAM OR CREAMERS) regular and decaf                             Plain  Jell-O  with no fruit (NO RED)                                           Fruit ices (not with fruit pulp, NO RED)                                     Popsicles (NO RED)                                                                  Juice: apple, WHITE grape, WHITE cranberry Sports drinks like Gatorade or Powerade (NO RED)               FOLLOW BOWEL PREP AND ANY ADDITIONAL PRE OP INSTRUCTIONS YOU RECEIVED FROM YOUR SURGEON'S OFFICE!!!   Oral Hygiene is also important to reduce your risk of infection.        Remember - BRUSH YOUR  TEETH THE MORNING OF SURGERY WITH YOUR REGULAR TOOTHPASTE  Do NOT smoke after Midnight the night before surgery.  Take ONLY these medicines the morning of surgery with A SIP OF WATER: Gabapentin, Topiramate, escitalopram (Lexapro), lithium, Synthroid Ursodiol, xyfaxin                   You may not have any metal on your body including hair pins, jewelry, and body piercing  Do not wear make-up, lotions, powders, perfumes or deodorant  Do not wear nail polish including gel and S&S, artificial / acrylic nails, or any other type of covering on natural nails including finger and toenails. If you have artificial nails, gel coating, etc., that needs to be removed by a nail salon, Please have this removed prior to surgery. Not doing so may mean that your surgery could be cancelled or delayed if the Surgeon or anesthesia staff feels like they are unable to monitor you safely.   Do not shave 48 hours prior to surgery to avoid nicks in your skin which may contribute to postoperative infections.   Patients discharged on the day of surgery will not be allowed to drive home.  Someone NEEDS to stay with you for the first 24 hours after anesthesia.  Do not bring your home medications to the hospital. The Pharmacy will dispense medications listed on your medication list to you during your admission in the Hospital.  Please read over the following fact sheets you were given: IF  YOU HAVE QUESTIONS ABOUT YOUR PRE-OP INSTRUCTIONS, PLEASE CALL [phone]  ([place])   [facility] - Preparing for Surgery Before surgery, you can play an important role.  Because skin is not sterile, your skin needs to be as free of germs as possible.  You can reduce the number of germs on your skin by washing with CHG (chlorahexidine gluconate) soap before surgery.  CHG is an antiseptic cleaner which kills germs and bonds with the skin to continue killing germs even after washing. Please DO NOT use if you have an allergy to CHG or antibacterial soaps.  If your skin becomes reddened/irritated stop using the CHG and inform your nurse when you arrive at Short Stay. Do not shave (including legs and underarms) for at least 48 hours prior to the first CHG shower.  You may shave your face/neck.  Please follow these instructions carefully:  1.  Shower with CHG Soap the night before surgery and the  morning of surgery.  2.  If you choose to wash your hair, wash your hair first as usual with your normal  shampoo.  3.  After you shampoo, rinse your hair and body thoroughly to remove the shampoo.                             4.  Use CHG as you would any other liquid soap.  You can apply chg directly to the skin and wash.  Gently with a scrungie or clean washcloth.  5.  Apply the CHG Soap to your body ONLY FROM THE NECK DOWN.   Do not use on face/ open                           Wound or open sores. Avoid contact with eyes, ears mouth and genitals (private parts).  Wash face,  Genitals (private parts) with your normal soap.             6.  Wash thoroughly, paying special attention to the area where your  surgery  will be performed.  7.  Thoroughly rinse your body with warm water from the neck down.  8.  DO NOT shower/wash with your normal soap after using and rinsing off the CHG Soap.            9.  Pat yourself dry with a clean towel.            10.  Wear clean pajamas.            11.   Place clean sheets on your bed the night of your first shower and do not  sleep with pets.  ON THE DAY OF SURGERY : Do not apply any lotions/deodorants the morning of surgery.  Please wear clean clothes to the hospital/surgery center.    FAILURE TO FOLLOW THESE INSTRUCTIONS MAY RESULT IN THE CANCELLATION OF YOUR SURGERY  PATIENT SIGNATURE_________________________________  NURSE SIGNATURE__________________________________  ________________________________________________________________________

## 2022-08-24 NOTE — Progress Notes (Signed)
Second request: Spoke with triage nurse to request pre op orders in epic

## 2022-08-24 NOTE — Progress Notes (Signed)
COVID Vaccine received:  '[]'$  No '[x]'$  Yes Date of any COVID positive Test in last 90 days:  PCP -  Josetta Huddle, MD Cardiologist - Jenne Campus, MD Rheumatology-  Gavin Pound, MD  Chest x-ray -  EKG -  07-13-22  Epic Stress Test - [phone] Epic ECHO - n/a Cardiac Cath - n/a CT coronary morph.- 10-14-2021 Epic CT Chest/ Abd/ Pelvis- 07-13-22  epic   Pacemaker/ICD device     '[]'$  N/A Spinal Cord Stimulator:'[]'$  No '[]'$  Yes      (Remind patient to bring remote DOS) Other Implants:   Bowel Prep -   History of Sleep Apnea? '[]'$  No '[]'$  Yes   Sleep Study Date:   CPAP used?- '[]'$  No '[]'$  Yes  (Instruct to bring their mask & Tubing)  Does the patient monitor blood sugar? '[]'$  No '[]'$  Yes  '[]'$  N/A Does patient have a [employer] or Dexacom? '[]'$  No '[]'$  Yes   Fasting Blood Sugar Ranges-  Checks Blood Sugar _____ times a day  Blood Thinner Instructions: Aspirin Instructions: Last Dose:  ERAS Protocol Ordered: '[]'$  No  '[x]'$  Yes no drink ordered   Comments: Hx of Roux-en-Y gastric bypass in 2001  Activity level: Patient can / can not climb a flight of stairs without difficulty;  '[]'$  No CP  '[]'$  No SOB,  but would have ______   Patient can / can not perform ADLs without assistance.   Anesthesia review: Hypotension, smoker, PONV, asthma, CKD, Bipolar1  Patient denies shortness of breath, fever, cough and chest pain at PAT appointment.  Patient verbalized understanding and agreement to the Pre-Surgical Instructions that were given to them at this PAT appointment. Patient was also educated of the need to review these PAT instructions again prior to his/her surgery.I reviewed the appropriate phone numbers to call if they have any and questions or concerns.

## 2022-08-26 ENCOUNTER — Encounter (HOSPITAL_COMMUNITY)
Admission: RE | Admit: 2022-08-26 | Discharge: 2022-08-26 | Disposition: A | Discharge status: Home / Self Care | Payer: BC Managed Care – PPO | Source: Ambulatory Visit | Attending: General Surgery | Admitting: General Surgery

## 2022-08-26 ENCOUNTER — Encounter (HOSPITAL_COMMUNITY): Payer: Self-pay

## 2022-08-26 ENCOUNTER — Other Ambulatory Visit: Payer: Self-pay

## 2022-08-26 DIAGNOSIS — Z9884 Bariatric surgery status: Secondary | ICD-10-CM | POA: Insufficient documentation

## 2022-08-26 DIAGNOSIS — Z01818 Encounter for other preprocedural examination: Secondary | ICD-10-CM | POA: Insufficient documentation

## 2022-08-26 LAB — COMPREHENSIVE METABOLIC PANEL
ALT: 24 U/L (ref 0–44)
AST: 22 U/L (ref 15–41)
Albumin: 3.6 g/dL (ref 3.5–5.0)
Alkaline Phosphatase: 219 U/L — ABNORMAL HIGH (ref 38–126)
Anion gap: 6 (ref 5–15)
BUN: 19 mg/dL (ref 6–20)
CO2: 20 mmol/L — ABNORMAL LOW (ref 22–32)
Calcium: 8.6 mg/dL — ABNORMAL LOW (ref 8.9–10.3)
Chloride: 115 mmol/L — ABNORMAL HIGH (ref 98–111)
Creatinine, Ser: 0.68 mg/dL (ref 0.44–1.00)
GFR, Estimated: >60 mL/min (ref >60)
Glucose, Bld: 92 mg/dL (ref 70–99)
Potassium: 4.3 mmol/L (ref 3.5–5.1)
Sodium: 141 mmol/L (ref 135–145)
Total Bilirubin: 0.6 mg/dL (ref 0.3–1.2)
Total Protein: 6.4 g/dL — ABNORMAL LOW (ref 6.5–8.1)

## 2022-08-26 LAB — CBC WITH DIFFERENTIAL/PLATELET
Abs Immature Granulocytes: 0.01 10*3/uL (ref 0.00–0.07)
Basophils Absolute: 0.1 10*3/uL (ref 0.0–0.1)
Basophils Relative: 1 %
Eosinophils Absolute: 0.3 10*3/uL (ref 0.0–0.5)
Eosinophils Relative: 7 %
HCT: 41 % (ref 36.0–46.0)
Hemoglobin: 13.2 g/dL (ref 12.0–15.0)
Immature Granulocytes: 0 %
Lymphocytes Relative: 29 %
Lymphs Abs: 1.4 10*3/uL (ref 0.7–4.0)
MCH: 33.3 pg (ref 26.0–34.0)
MCHC: 32.2 g/dL (ref 30.0–36.0)
MCV: 103.5 fL — ABNORMAL HIGH (ref 80.0–100.0)
Monocytes Absolute: 0.4 10*3/uL (ref 0.1–1.0)
Monocytes Relative: 7 %
Neutro Abs: 2.8 10*3/uL (ref 1.7–7.7)
Neutrophils Relative %: 56 %
Platelets: 255 10*3/uL (ref 150–400)
RBC: 3.96 MIL/uL (ref 3.87–5.11)
RDW: 11.9 % (ref 11.5–15.5)
WBC: 4.9 10*3/uL (ref 4.0–10.5)
nRBC: 0 % (ref 0.0–0.2)

## 2022-08-30 ENCOUNTER — Other Ambulatory Visit: Payer: Self-pay

## 2022-08-30 ENCOUNTER — Observation Stay (HOSPITAL_COMMUNITY)
Admission: RE | Admit: 2022-08-30 | Discharge: 2022-08-31 | Disposition: A | Discharge status: Home / Self Care | Payer: BC Managed Care – PPO | Attending: General Surgery | Admitting: General Surgery

## 2022-08-30 ENCOUNTER — Ambulatory Visit (HOSPITAL_COMMUNITY): Payer: BC Managed Care – PPO | Admitting: Anesthesiology

## 2022-08-30 ENCOUNTER — Encounter (HOSPITAL_COMMUNITY): Payer: Self-pay | Admitting: General Surgery

## 2022-08-30 ENCOUNTER — Encounter (HOSPITAL_COMMUNITY)
Admission: RE | Disposition: A | Discharge status: Home / Self Care | Payer: Self-pay | Source: Home / Self Care | Attending: General Surgery

## 2022-08-30 DIAGNOSIS — J45909 Unspecified asthma, uncomplicated: Secondary | ICD-10-CM | POA: Insufficient documentation

## 2022-08-30 DIAGNOSIS — Z9884 Bariatric surgery status: Secondary | ICD-10-CM | POA: Insufficient documentation

## 2022-08-30 DIAGNOSIS — E039 Hypothyroidism, unspecified: Secondary | ICD-10-CM | POA: Diagnosis not present

## 2022-08-30 DIAGNOSIS — K9589 Other complications of other bariatric procedure: Secondary | ICD-10-CM | POA: Insufficient documentation

## 2022-08-30 DIAGNOSIS — Z9889 Other specified postprocedural states: Secondary | ICD-10-CM | POA: Insufficient documentation

## 2022-08-30 DIAGNOSIS — N189 Chronic kidney disease, unspecified: Secondary | ICD-10-CM | POA: Diagnosis not present

## 2022-08-30 DIAGNOSIS — R109 Unspecified abdominal pain: Secondary | ICD-10-CM | POA: Diagnosis not present

## 2022-08-30 DIAGNOSIS — I129 Hypertensive chronic kidney disease with stage 1 through stage 4 chronic kidney disease, or unspecified chronic kidney disease: Secondary | ICD-10-CM | POA: Diagnosis not present

## 2022-08-30 DIAGNOSIS — K219 Gastro-esophageal reflux disease without esophagitis: Secondary | ICD-10-CM | POA: Diagnosis not present

## 2022-08-30 DIAGNOSIS — Z87891 Personal history of nicotine dependence: Secondary | ICD-10-CM | POA: Diagnosis not present

## 2022-08-30 DIAGNOSIS — R1084 Generalized abdominal pain: Secondary | ICD-10-CM | POA: Diagnosis not present

## 2022-08-30 HISTORY — PX: UPPER GI ENDOSCOPY: SHX6162

## 2022-08-30 HISTORY — PX: LAPAROSCOPY: SHX197

## 2022-08-30 LAB — TYPE AND SCREEN
ABO/RH(D): A POS
Antibody Screen: NEGATIVE

## 2022-08-30 LAB — ABO/RH: ABO/RH(D): A POS

## 2022-08-30 SURGERY — LAPAROSCOPY, DIAGNOSTIC
Anesthesia: General

## 2022-08-30 MED ORDER — DIPHENHYDRAMINE HCL 50 MG/ML IJ SOLN: 12.5000 mg | Freq: Four times a day (QID) | INTRAMUSCULAR | Status: DC | PRN

## 2022-08-30 MED ORDER — BUPIVACAINE LIPOSOME 1.3 % IJ SUSP: 20.0000 mL | Freq: Once | INTRAMUSCULAR | Status: DC

## 2022-08-30 MED ORDER — PANTOPRAZOLE SODIUM 40 MG IV SOLR
40.0000 mg | Freq: Every day | INTRAVENOUS | Status: DC
  Administered 2022-08-30: 40 mg via INTRAVENOUS
  Filled 2022-08-30: qty 10

## 2022-08-30 MED ORDER — ENOXAPARIN SODIUM 40 MG/0.4ML IJ SOSY
40.0000 mg | PREFILLED_SYRINGE | INTRAMUSCULAR | Status: DC
  Administered 2022-08-31: 40 mg via SUBCUTANEOUS
  Filled 2022-08-30: qty 0.4

## 2022-08-30 MED ORDER — LIDOCAINE 2% (20 MG/ML) 5 ML SYRINGE
INTRAMUSCULAR | Status: DC | PRN
  Administered 2022-08-30: 60 mg via INTRAVENOUS

## 2022-08-30 MED ORDER — SUCCINYLCHOLINE CHLORIDE 200 MG/10ML IV SOSY
PREFILLED_SYRINGE | INTRAVENOUS | Status: DC | PRN
  Administered 2022-08-30: 100 mg via INTRAVENOUS

## 2022-08-30 MED ORDER — ALPRAZOLAM 0.5 MG PO TABS: 1.0000 mg | ORAL_TABLET | Freq: Three times a day (TID) | ORAL | Status: DC | PRN

## 2022-08-30 MED ORDER — LACTATED RINGERS IR SOLN
Status: DC | PRN
  Administered 2022-08-30: 1000 mL

## 2022-08-30 MED ORDER — ONDANSETRON HCL 4 MG/2ML IJ SOLN
INTRAMUSCULAR | Status: DC | PRN
  Administered 2022-08-30: 4 mg via INTRAVENOUS

## 2022-08-30 MED ORDER — SPIRONOLACTONE 25 MG PO TABS
50.0000 mg | ORAL_TABLET | Freq: Every day | ORAL | Status: DC
  Filled 2022-08-30: qty 2

## 2022-08-30 MED ORDER — PROCHLORPERAZINE EDISYLATE 10 MG/2ML IJ SOLN: 10.0000 mg | Freq: Four times a day (QID) | INTRAMUSCULAR | Status: DC | PRN

## 2022-08-30 MED ORDER — PHENYLEPHRINE 80 MCG/ML (10ML) SYRINGE FOR IV PUSH (FOR BLOOD PRESSURE SUPPORT)
PREFILLED_SYRINGE | INTRAVENOUS | Status: DC | PRN
  Administered 2022-08-30 (×2): 80 ug via INTRAVENOUS

## 2022-08-30 MED ORDER — TOPIRAMATE 25 MG PO TABS
25.0000 mg | ORAL_TABLET | Freq: Two times a day (BID) | ORAL | Status: DC
  Administered 2022-08-30 – 2022-08-31 (×2): 25 mg via ORAL
  Filled 2022-08-30 (×2): qty 1

## 2022-08-30 MED ORDER — DEXAMETHASONE SODIUM PHOSPHATE 10 MG/ML IJ SOLN
INTRAMUSCULAR | Status: AC
  Filled 2022-08-30: qty 1

## 2022-08-30 MED ORDER — OXYCODONE HCL 5 MG PO TABS
5.0000 mg | ORAL_TABLET | ORAL | Status: DC | PRN
  Administered 2022-08-30: 5 mg via ORAL
  Administered 2022-08-31 (×2): 10 mg via ORAL
  Filled 2022-08-30: qty 1
  Filled 2022-08-30 (×2): qty 2

## 2022-08-30 MED ORDER — SUCCINYLCHOLINE CHLORIDE 200 MG/10ML IV SOSY
PREFILLED_SYRINGE | INTRAVENOUS | Status: AC
  Filled 2022-08-30: qty 10

## 2022-08-30 MED ORDER — FENTANYL CITRATE (PF) 250 MCG/5ML IJ SOLN
INTRAMUSCULAR | Status: DC | PRN
  Administered 2022-08-30: 100 ug via INTRAVENOUS

## 2022-08-30 MED ORDER — ONDANSETRON HCL 4 MG/2ML IJ SOLN
4.0000 mg | Freq: Four times a day (QID) | INTRAMUSCULAR | Status: DC | PRN
  Administered 2022-08-30: 4 mg via INTRAVENOUS
  Filled 2022-08-30: qty 2

## 2022-08-30 MED ORDER — MIDODRINE HCL 5 MG PO TABS: 2.5000 mg | ORAL_TABLET | Freq: Four times a day (QID) | ORAL | Status: DC | PRN

## 2022-08-30 MED ORDER — POLYVINYL ALCOHOL 1.4 % OP SOLN: 1.0000 [drp] | Freq: Three times a day (TID) | OPHTHALMIC | Status: DC | PRN

## 2022-08-30 MED ORDER — CHLORHEXIDINE GLUCONATE CLOTH 2 % EX PADS: 6.0000 | MEDICATED_PAD | Freq: Once | CUTANEOUS | Status: DC

## 2022-08-30 MED ORDER — DICYCLOMINE HCL 20 MG PO TABS: 20.0000 mg | ORAL_TABLET | Freq: Three times a day (TID) | ORAL | Status: DC | PRN

## 2022-08-30 MED ORDER — CHLORHEXIDINE GLUCONATE 0.12 % MT SOLN
15.0000 mL | Freq: Once | OROMUCOSAL | Status: AC
  Administered 2022-08-30: 15 mL via OROMUCOSAL

## 2022-08-30 MED ORDER — ONDANSETRON 4 MG PO TBDP: 4.0000 mg | ORAL_TABLET | Freq: Four times a day (QID) | ORAL | Status: DC | PRN

## 2022-08-30 MED ORDER — QUETIAPINE FUMARATE 50 MG PO TABS
400.0000 mg | ORAL_TABLET | Freq: Every day | ORAL | Status: DC
  Administered 2022-08-30: 400 mg via ORAL
  Filled 2022-08-30: qty 8

## 2022-08-30 MED ORDER — LITHIUM CARBONATE 300 MG PO TABS
300.0000 mg | ORAL_TABLET | Freq: Two times a day (BID) | ORAL | Status: DC
  Administered 2022-08-30: 300 mg via ORAL
  Filled 2022-08-30: qty 1

## 2022-08-30 MED ORDER — DEXAMETHASONE SODIUM PHOSPHATE 10 MG/ML IJ SOLN
INTRAMUSCULAR | Status: DC | PRN
  Administered 2022-08-30: 10 mg via INTRAVENOUS

## 2022-08-30 MED ORDER — ROCURONIUM BROMIDE 10 MG/ML (PF) SYRINGE
PREFILLED_SYRINGE | INTRAVENOUS | Status: DC | PRN
  Administered 2022-08-30: 40 mg via INTRAVENOUS

## 2022-08-30 MED ORDER — ONDANSETRON HCL 4 MG/2ML IJ SOLN
INTRAMUSCULAR | Status: AC
  Filled 2022-08-30: qty 2

## 2022-08-30 MED ORDER — 0.9 % SODIUM CHLORIDE (POUR BTL) OPTIME
TOPICAL | Status: DC | PRN
  Administered 2022-08-30: 1000 mL

## 2022-08-30 MED ORDER — MIDAZOLAM HCL 2 MG/2ML IJ SOLN
INTRAMUSCULAR | Status: AC
  Filled 2022-08-30: qty 2

## 2022-08-30 MED ORDER — LITHIUM CARBONATE 300 MG PO CAPS
300.0000 mg | ORAL_CAPSULE | Freq: Two times a day (BID) | ORAL | Status: DC
  Administered 2022-08-31: 300 mg via ORAL
  Filled 2022-08-30 (×3): qty 1

## 2022-08-30 MED ORDER — DIPHENHYDRAMINE HCL 12.5 MG/5ML PO ELIX: 12.5000 mg | ORAL_SOLUTION | Freq: Four times a day (QID) | ORAL | Status: DC | PRN

## 2022-08-30 MED ORDER — SUGAMMADEX SODIUM 200 MG/2ML IV SOLN
INTRAVENOUS | Status: DC | PRN
  Administered 2022-08-30: 200 mg via INTRAVENOUS

## 2022-08-30 MED ORDER — HYDROMORPHONE HCL 1 MG/ML IJ SOLN
INTRAMUSCULAR | Status: AC
  Filled 2022-08-30: qty 2

## 2022-08-30 MED ORDER — PROPOFOL 10 MG/ML IV BOLUS
INTRAVENOUS | Status: AC
  Filled 2022-08-30: qty 40

## 2022-08-30 MED ORDER — BUPIVACAINE-EPINEPHRINE (PF) 0.25% -1:200000 IJ SOLN
INTRAMUSCULAR | Status: AC
  Filled 2022-08-30: qty 30

## 2022-08-30 MED ORDER — ROCURONIUM BROMIDE 10 MG/ML (PF) SYRINGE
PREFILLED_SYRINGE | INTRAVENOUS | Status: AC
  Filled 2022-08-30: qty 10

## 2022-08-30 MED ORDER — BUPIVACAINE-EPINEPHRINE 0.25% -1:200000 IJ SOLN
INTRAMUSCULAR | Status: DC | PRN
  Administered 2022-08-30: 30 mL

## 2022-08-30 MED ORDER — BUPIVACAINE LIPOSOME 1.3 % IJ SUSP
INTRAMUSCULAR | Status: AC
  Filled 2022-08-30: qty 20

## 2022-08-30 MED ORDER — LACTATED RINGERS IV SOLN: INTRAVENOUS | Status: DC

## 2022-08-30 MED ORDER — STERILE WATER FOR IRRIGATION IR SOLN
Status: DC | PRN
  Administered 2022-08-30: 1000 mL

## 2022-08-30 MED ORDER — FENTANYL CITRATE PF 50 MCG/ML IJ SOSY
25.0000 ug | PREFILLED_SYRINGE | INTRAMUSCULAR | Status: DC | PRN
  Administered 2022-08-31: 50 ug via INTRAVENOUS
  Filled 2022-08-30: qty 1

## 2022-08-30 MED ORDER — PHENYLEPHRINE 80 MCG/ML (10ML) SYRINGE FOR IV PUSH (FOR BLOOD PRESSURE SUPPORT)
PREFILLED_SYRINGE | INTRAVENOUS | Status: AC
  Filled 2022-08-30: qty 10

## 2022-08-30 MED ORDER — AMISULPRIDE (ANTIEMETIC) 5 MG/2ML IV SOLN
INTRAVENOUS | Status: AC
  Filled 2022-08-30: qty 4

## 2022-08-30 MED ORDER — HYDROMORPHONE HCL 1 MG/ML IJ SOLN
0.2500 mg | INTRAMUSCULAR | Status: DC | PRN
  Administered 2022-08-30 (×2): 0.5 mg via INTRAVENOUS

## 2022-08-30 MED ORDER — MIDAZOLAM HCL 5 MG/5ML IJ SOLN
INTRAMUSCULAR | Status: DC | PRN
  Administered 2022-08-30: 2 mg via INTRAVENOUS

## 2022-08-30 MED ORDER — BUPIVACAINE LIPOSOME 1.3 % IJ SUSP
INTRAMUSCULAR | Status: DC | PRN
  Administered 2022-08-30: 20 mL

## 2022-08-30 MED ORDER — CIPROFLOXACIN IN D5W 400 MG/200ML IV SOLN
400.0000 mg | INTRAVENOUS | Status: AC
  Administered 2022-08-30: 400 mg via INTRAVENOUS
  Filled 2022-08-30: qty 200

## 2022-08-30 MED ORDER — ORAL CARE MOUTH RINSE: 15.0000 mL | Freq: Once | OROMUCOSAL | Status: AC

## 2022-08-30 MED ORDER — PROPOFOL 10 MG/ML IV BOLUS
INTRAVENOUS | Status: DC | PRN
  Administered 2022-08-30: 120 mg via INTRAVENOUS

## 2022-08-30 MED ORDER — GABAPENTIN 100 MG PO CAPS
100.0000 mg | ORAL_CAPSULE | Freq: Three times a day (TID) | ORAL | Status: DC
  Administered 2022-08-30 – 2022-08-31 (×3): 100 mg via ORAL
  Filled 2022-08-30 (×3): qty 1

## 2022-08-30 MED ORDER — LEVOTHYROXINE SODIUM 88 MCG PO TABS
88.0000 ug | ORAL_TABLET | Freq: Every day | ORAL | Status: DC
  Administered 2022-08-31: 88 ug via ORAL
  Filled 2022-08-30: qty 1

## 2022-08-30 MED ORDER — FENTANYL CITRATE (PF) 100 MCG/2ML IJ SOLN
INTRAMUSCULAR | Status: AC
  Filled 2022-08-30: qty 2

## 2022-08-30 MED ORDER — SIMETHICONE 80 MG PO CHEW: 40.0000 mg | CHEWABLE_TABLET | Freq: Four times a day (QID) | ORAL | Status: DC | PRN

## 2022-08-30 MED ORDER — CARIPRAZINE HCL 1.5 MG PO CAPS
1.5000 mg | ORAL_CAPSULE | ORAL | Status: DC
  Administered 2022-08-30: 1.5 mg via ORAL
  Filled 2022-08-30: qty 1

## 2022-08-30 MED ORDER — LIDOCAINE HCL (PF) 2 % IJ SOLN
INTRAMUSCULAR | Status: AC
  Filled 2022-08-30: qty 5

## 2022-08-30 MED ORDER — AMISULPRIDE (ANTIEMETIC) 5 MG/2ML IV SOLN
10.0000 mg | Freq: Once | INTRAVENOUS | Status: AC | PRN
  Administered 2022-08-30: 10 mg via INTRAVENOUS

## 2022-08-30 MED ORDER — ESCITALOPRAM OXALATE 20 MG PO TABS
20.0000 mg | ORAL_TABLET | Freq: Every day | ORAL | Status: DC
  Administered 2022-08-31: 20 mg via ORAL
  Filled 2022-08-30: qty 1

## 2022-08-30 MED ORDER — KCL IN DEXTROSE-NACL 20-5-0.45 MEQ/L-%-% IV SOLN
INTRAVENOUS | Status: DC
  Filled 2022-08-30 (×2): qty 1000

## 2022-08-30 SURGICAL SUPPLY — 71 items
ADH SKN CLS APL DERMABOND .7 (GAUZE/BANDAGES/DRESSINGS) ×1
APL PRP STRL LF DISP 70% ISPRP (MISCELLANEOUS) ×1
APPLIER CLIP 5 13 M/L LIGAMAX5 (MISCELLANEOUS)
APPLIER CLIP ROT 10 11.4 M/L (STAPLE)
APR CLP MED LRG 11.4X10 (STAPLE)
APR CLP MED LRG 5 ANG JAW (MISCELLANEOUS)
BAG COUNTER SPONGE SURGICOUNT (BAG) IMPLANT
BAG SPEC RTRVL 10 TROC 200 (ENDOMECHANICALS) ×1
BAG SPNG CNTER NS LX DISP (BAG)
BLADE EXTENDED COATED 6.5IN (ELECTRODE) IMPLANT
BLADE SURG SZ10 CARB STEEL (BLADE) IMPLANT
CELLS DAT CNTRL 66122 CELL SVR (MISCELLANEOUS) IMPLANT
CHLORAPREP W/TINT 26 (MISCELLANEOUS) ×1 IMPLANT
CLIP APPLIE 5 13 M/L LIGAMAX5 (MISCELLANEOUS) IMPLANT
CLIP APPLIE ROT 10 11.4 M/L (STAPLE) IMPLANT
COVER MAYO STAND STRL (DRAPES) IMPLANT
COVER SURGICAL LIGHT HANDLE (MISCELLANEOUS) ×1 IMPLANT
DERMABOND ADVANCED .7 DNX12 (GAUZE/BANDAGES/DRESSINGS) IMPLANT
DRAPE SHEET LG 3/4 BI-LAMINATE (DRAPES) IMPLANT
DRAPE WARM FLUID 44X44 (DRAPES) IMPLANT
ELECT REM PT RETURN 15FT ADLT (MISCELLANEOUS) ×1 IMPLANT
GAUZE SPONGE 4X4 12PLY STRL (GAUZE/BANDAGES/DRESSINGS) ×1 IMPLANT
GLOVE BIO SURGEON STRL SZ7.5 (GLOVE) ×1 IMPLANT
GLOVE INDICATOR 8.0 STRL GRN (GLOVE) ×1 IMPLANT
GOWN STRL REUS W/ TWL XL LVL3 (GOWN DISPOSABLE) ×4 IMPLANT
GOWN STRL REUS W/TWL XL LVL3 (GOWN DISPOSABLE) ×4
GRASPER SUT TROCAR 14GX15 (MISCELLANEOUS) IMPLANT
HANDLE SUCTION POOLE (INSTRUMENTS) IMPLANT
IRRIG SUCT STRYKERFLOW 2 WTIP (MISCELLANEOUS)
IRRIGATION SUCT STRKRFLW 2 WTP (MISCELLANEOUS) IMPLANT
KIT BASIN OR (CUSTOM PROCEDURE TRAY) ×1 IMPLANT
KIT GASTRIC LAVAGE 34FR ADT (SET/KITS/TRAYS/PACK) IMPLANT
KIT TURNOVER KIT A (KITS) IMPLANT
LEGGING LITHOTOMY PAIR STRL (DRAPES) IMPLANT
POUCH RETRIEVAL ECOSAC 10 (ENDOMECHANICALS) IMPLANT
POUCH RETRIEVAL ECOSAC 10MM (ENDOMECHANICALS) ×1
RELOAD STAPLE 60 3.8 GOLD REG (STAPLE) IMPLANT
RELOAD STAPLER GOLD 60MM (STAPLE) ×2 IMPLANT
RETRACTOR WND ALEXIS 18 MED (MISCELLANEOUS) IMPLANT
RTRCTR WOUND ALEXIS 18CM MED (MISCELLANEOUS)
SCISSORS LAP 5X35 DISP (ENDOMECHANICALS) ×1 IMPLANT
SHEARS HARMONIC ACE PLUS 36CM (ENDOMECHANICALS) IMPLANT
SHEARS HARMONIC ACE PLUS 45CM (MISCELLANEOUS) IMPLANT
SLEEVE Z-THREAD 5X100MM (TROCAR) ×1 IMPLANT
SPIKE FLUID TRANSFER (MISCELLANEOUS) ×1 IMPLANT
STAPLER ECHELON LONG 60 440 (INSTRUMENTS) IMPLANT
STAPLER RELOAD GOLD 60MM (STAPLE) ×2
STAPLER VISISTAT 35W (STAPLE) IMPLANT
STRIP CLOSURE SKIN 1/2X4 (GAUZE/BANDAGES/DRESSINGS) IMPLANT
SUCTION POOLE HANDLE (INSTRUMENTS)
SUT MNCRL AB 4-0 PS2 18 (SUTURE) IMPLANT
SUT PDS AB 1 TP1 96 (SUTURE) IMPLANT
SUT PROLENE 2 0 KS (SUTURE) IMPLANT
SUT PROLENE 2 0 SH DA (SUTURE) IMPLANT
SUT SILK 2 0 (SUTURE)
SUT SILK 2 0 SH CR/8 (SUTURE) IMPLANT
SUT SILK 2-0 18XBRD TIE 12 (SUTURE) IMPLANT
SUT SILK 3 0 (SUTURE)
SUT SILK 3 0 SH CR/8 (SUTURE) IMPLANT
SUT SILK 3-0 18XBRD TIE 12 (SUTURE) IMPLANT
SYR BULB IRRIG 60ML STRL (SYRINGE) IMPLANT
SYS LAPSCP GELPORT 120MM (MISCELLANEOUS)
SYSTEM LAPSCP GELPORT 120MM (MISCELLANEOUS) IMPLANT
TOWEL OR 17X26 10 PK STRL BLUE (TOWEL DISPOSABLE) ×1 IMPLANT
TOWEL OR NON WOVEN STRL DISP B (DISPOSABLE) ×1 IMPLANT
TRAY FOLEY MTR SLVR 16FR STAT (SET/KITS/TRAYS/PACK) ×1 IMPLANT
TRAY LAPAROSCOPIC (CUSTOM PROCEDURE TRAY) ×1 IMPLANT
TROCAR 11X100 Z THREAD (TROCAR) IMPLANT
TROCAR XCEL NON-BLD 5MMX100MML (ENDOMECHANICALS) IMPLANT
TROCAR Z-THREAD OPTICAL 5X100M (TROCAR) ×1 IMPLANT
YANKAUER SUCT BULB TIP NO VENT (SUCTIONS) IMPLANT

## 2022-08-30 NOTE — Transfer of Care (Signed)
Immediate Anesthesia Transfer of Care Note  Patient: Jordan Russell  Procedure(s) Performed: LAPAROSCOPY DIAGNOSTIC, LAPAROSCOPIC RESECTION OF CANDY CANE LIMB UPPER GI ENDOSCOPY  Patient Location: PACU  Anesthesia Type:General  Level of Consciousness: oriented, drowsy and patient cooperative  Airway & Oxygen Therapy: Patient Spontanous Breathing and Patient connected to nasal cannula oxygen  Post-op Assessment: Report given to RN and Post -op Vital signs reviewed and stable  Post vital signs: Reviewed  Last Vitals:  Vitals Value Taken Time  BP 110/67 08/30/22 1306  Temp    Pulse 82 08/30/22 1309  Resp 15 08/30/22 1309  SpO2 98 % 08/30/22 1309  Vitals shown include unvalidated device data.  Last Pain:  Vitals:   08/30/22 0923  TempSrc: Oral  PainSc: 3          Complications: No notable events documented.

## 2022-08-30 NOTE — Anesthesia Procedure Notes (Signed)
Procedure Name: Intubation Date/Time: 08/30/2022 11:29 AM  Performed by: Montel Clock, CRNAPre-anesthesia Checklist: Patient identified, Emergency Drugs available, Suction available, Patient being monitored and Timeout performed Patient Re-evaluated:Patient Re-evaluated prior to induction Oxygen Delivery Method: Circle system utilized Preoxygenation: Pre-oxygenation with 100% oxygen Induction Type: IV induction, Rapid sequence and Cricoid Pressure applied Laryngoscope Size: Mac and 3 Grade View: Grade I Tube type: Oral Tube size: 7.0 mm Number of attempts: 1 Airway Equipment and Method: Stylet Placement Confirmation: ETT inserted through vocal cords under direct vision, positive ETCO2 and breath sounds checked- equal and bilateral Secured at: 21 cm Tube secured with: Tape Dental Injury: Teeth and Oropharynx as per pre-operative assessment

## 2022-08-30 NOTE — Op Note (Signed)
08/30/2022  1:02 PM  PATIENT:  Jordan Russell  51 y.o. female  PRE-OPERATIVE DIAGNOSIS:  ABDOMINAL PAIN, REFLUX, HISTORY OF GASTRIC BYPASS & HISTORY OF ROBOTIC HIATAL HERNIA REPAIR WITH MESH  POST-OPERATIVE DIAGNOSIS:  SAME + DILATED CANDY CANE LIMB  PROCEDURE:  Procedure(s): LAPAROSCOPY DIAGNOSTIC, LAPAROSCOPIC RESECTION OF CANDY CANE LIMB UPPER GI ENDOSCOPY  SURGEON:  Surgeon(s): Greer Pickerel, MD  ASSISTANTS: Johnathan Hausen, MD  [facility] Pagat PA- student  ANESTHESIA:   general  DRAINS: none   LOCAL MEDICATIONS USED:  MARCAINE    and OTHER EXPAREL  SPECIMEN:  Source of Specimen:  CANDY CANE LIMB  DISPOSITION OF SPECIMEN:  PATHOLOGY  COUNTS:  YES  EBL: MINIMAL  INDICATION FOR PROCEDURE: 51 year old female who has a very remote history of laparoscopic Roux-en-Y gastric bypass developed a hiatal hernia a few years ago where her gastric pouch herniated into her mediastinum causing pain with eating, regurgitation and heartburn.  She underwent robotic hiatal hernia repair with mesh last October.  Her pouch had herniated into her mediastinum.  She came back to see me with a few month history of pain after eating, left upper quadrant pain, bloating, heartburn and intermittent regurgitation about 15 to 20 minutes after eating.  She also had significant weight gain.  She underwent CT imaging, upper GI.  There was no evidence of a GG fistula.  It appeared that her candycane limb/blind end of the Roux limb preferentially filled upper GI before emptying into the Roux limb.  There is also significant small bowel delay of contrast up to 3 hours to reach her cecum.  We discussed timing of upper endoscopy versus diagnostic laparoscopy and whether or not to do the separately or together.  Patient preferred to do 1 anesthetic for financial reasons.  We discussed that this may just be diagnostic maneuvers today and not therapeutic.  We did discuss possibly resecting her candycane limb if felt to  be contributing to some of her symptoms.  We had discussed on the phone risk and benefits including but not limited to bleeding, infection, injury to surrounding structures, leak, persistent symptoms, blood clot formation, fistula, perioperative cardiac and pulmonary events, persistent reflux.  Discussed that today's procedure would not ameliorate her constipation and intermittent fecal incontinence.  PROCEDURE: Patient was taken to OR to at [facility] placed supine on the operating room table.  General endotracheal anesthesia was established.  Sequential compression devices were placed.  Her left arm was tucked at her side with the appropriate padding.  Abdomen was prepped and draped in standard surgical fashion.  IV antibiotic was administered.  Surgical timeout was performed.  I gained access to the abdomen using the Optiview technique in the left upper quadrant.  I used one of her old incisions the left upper quadrant.  A small incision made.  A 0 degree 5 mm laparoscope through a 5 Miller trocar was advanced through all layers of the abdominal wall and the abdominal cavity was entered.  Pneumoperitoneum was smoothly established up to a patient pressure of 15 mmHg without any changes in patient vital signs.  The laparoscope was advanced and the abdominal cavity was surveilled.  There was no evidence of injury to surrounding structures.  There was an omental band to the left lateral abdominal wall.  I placed 2 additional 5 mm trocars along the left lateral abdominal wall.  A bilateral laparoscopic tap block was performed for postoperative pain relief.  Patient had a redundant sigmoid colon that appeared full  of stool.  The cecum was identified.  The terminal ileum was identified.  Started running the small bowel back proximally using atraumatic bowel graspers.  The distal small bowel and mid small bowel appeared grossly normal.  It was peristalsing.  There was some sections that appeared a little bit  more dilated than other sections.  There was no creeping fat.  The jejunojejunostomy was identified and inspected.  There is no mesenteric defect.  It was a little bit dilated but no more so than what we would typically stay in a long-term chronic gastric bypass.  There was no evidence of intussusception.  The BP ileum was identified and ran back proximally.  I then came back to the jejunojejunostomy and identified the Roux limb and ran it back up toward the upper abdomen.  It was antecolic.  I could visualize the blind end of the Roux limb AKA the candycane limb on the left side of the abdomen it was mildly adhered to the Roux limb.  I decided to put a Nathanson liver retractor in.  This was done through a 5 mm trocar in the subxiphoid position.  We placed the patient in Trendelenburg position.  I ended up placing an additional 5 mm trocar in the right lateral abdominal wall.  At this time I had my assistant clamped the Roux limb and I went to the head of the bed and performed an upper endoscopy.  The Olympus endoscope was placed in the patient's oropharynx and gently glided down her esophagus.  Her esophagus was a little bit patulous.  There is no evidence of esophagitis.  The Z-line was inspected and it was grossly normal.  The Z-line was located at 39 cm.  The endoscope was advanced into the gastric pouch.  The the gastrojejunostomy was identified and it was widely patent.  The gastrojejunostomy was dilated.  There is no exposed staples.  Directly in line with the gastrojejunostomy was the blind end/candycane limb of the Roux limb.  The endoscope went directly into the blind in.  This was confirmed laparoscopically.  The blind end/candycane limb ended up being grossly distended.  The scope was pulled back and I was able to identify the Roux limb and advance the endoscope down the Roux limb which appeared normal.  This was also confirmed laparoscopically as we could visualize the endoscope in the Roux limb.   The bulbous structure on the patient's upper GI preoperatively corresponded with the blind end of the candycane limb.  The bulbous structure/candycane limb preferentially filled on her upper GI and the endoscope preferentially entered the blind end.  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  I had discussed this with the patient preoperatively and we had agreed to do a resection of the candycane limb if intraoperative findings aligned with that.  I scrubbed back in.  I exchanged the mid abdominal 5 mm trocar to a 12 mm trocar.  Using EndoShears we took down the adhesions between the Roux limb and the candycane limb.  There were some adhesions on the left side of the candycane which was mainly omental which were taken down with harmonic scalpel.  We identified the gastrojejunostomy as evident by the staples on the outside of the pouch Roux limb.  I took down some of the mesenteric pedicle to the candycane with harmonic scalpel.  I then obtained the Echelon Ethicon 60 mm stapler with a gold load and clamped across the candycane limb just near  the gastrojejunostomy.  My assistant scrubbed out and placed the endoscope back in the patient's oropharynx and gently glided it down her esophagus into the gastric pouch and was able to cannulate the Roux limb with the surgical stapler clamped across the candycane limb.  Thus confirming that we had not significantly narrowed the gastrojejunostomy too much.  The stapler was fired.  There was still a little bit of intact staple lines today with an additional 60 mm gold load was used to free the last 1 cm.  I then flooded the upper abdomen with saline while my assistant insufflated the gastric pouch and gastrojejunostomy I had the Roux limb clamped.  There was no evidence of bubbles or leakage of air.  All staples appeared intact.  There is no evidence of bleeding along the staple line.  The endoscope was used to decompress the gastric  pouch and the Roux limb and withdrawn.  I placed the candycane specimen in a specimen retrieval bag and brought it out through the 11 mm trocar.  I closed the fascial defect with a single interrupted 0 Vicryl using PMI suture passer.  Local was infiltrated.  Liver retractor was removed.  Trocars were removed and pneumoperitoneum had been released.  Skin incisions were closed with a 4 Monocryl in a subcuticular fashion.  The PA student had scrubbed in and help me close the skin incisions all I supervised.  Dermabond was applied.  All needle, instrument sponge counts were correct x2.  There were no immediate complications.  The patient tolerated the procedure well and was transported to the recovery room in stable condition.  PLAN OF CARE: Admit for overnight observation  PATIENT DISPOSITION:  PACU - hemodynamically stable.   Delay start of Pharmacological VTE agent (>24hrs) due to surgical blood loss or risk of bleeding:  no  Leighton Ruff. Redmond Pulling, MD, FACS General, Bariatric, & Minimally Invasive Surgery [facility], [place]

## 2022-08-30 NOTE — Anesthesia Preprocedure Evaluation (Addendum)
Anesthesia Evaluation  Patient identified by MRN, date of birth, ID band Patient awake    Reviewed: Allergy & Precautions, H&P , NPO status , Patient's Chart, lab work & pertinent test results  History of Anesthesia Complications (+) PONV and history of anesthetic complications  Airway Mallampati: II  TM Distance: >3 FB Neck ROM: Full    Dental no notable dental hx. (+) Chipped, Dental Advisory Given   Pulmonary asthma , Patient abstained from smoking., former smoker,    Pulmonary exam normal breath sounds clear to auscultation       Cardiovascular negative cardio ROS   Rhythm:Regular Rate:Normal     Neuro/Psych  Headaches, Anxiety Depression Bipolar Disorder    GI/Hepatic hiatal hernia, GERD  Medicated,  Endo/Other  Hypothyroidism   Renal/GU negative Renal ROS  negative genitourinary   Musculoskeletal  (+) Arthritis , Osteoarthritis,  Fibromyalgia -  Abdominal   Peds  Hematology  (+) Blood dyscrasia, anemia ,   Anesthesia Other Findings   Reproductive/Obstetrics negative OB ROS                            Anesthesia Physical Anesthesia Plan  ASA: 3  Anesthesia Plan: General   Post-op Pain Management: Toradol IV (intra-op)*   Induction: Intravenous  PONV Risk Score and Plan: 4 or greater and Ondansetron, Dexamethasone and Midazolam  Airway Management Planned: Oral ETT  Additional Equipment:   Intra-op Plan:   Post-operative Plan: Extubation in OR  Informed Consent: I have reviewed the patients History and Physical, chart, labs and discussed the procedure including the risks, benefits and alternatives for the proposed anesthesia with the patient or authorized representative who has indicated his/her understanding and acceptance.     Dental advisory given  Plan Discussed with: CRNA  Anesthesia Plan Comments:         Anesthesia Quick Evaluation

## 2022-08-30 NOTE — Anesthesia Postprocedure Evaluation (Signed)
Anesthesia Post Note  Patient: ASHANI PUMPHREY  Procedure(s) Performed: LAPAROSCOPY DIAGNOSTIC, LAPAROSCOPIC RESECTION OF CANDY CANE LIMB UPPER GI ENDOSCOPY     Patient location during evaluation: PACU Anesthesia Type: General Level of consciousness: awake and alert Pain management: pain level controlled Vital Signs Assessment: post-procedure vital signs reviewed and stable Respiratory status: spontaneous breathing, nonlabored ventilation, respiratory function stable and patient connected to nasal cannula oxygen Cardiovascular status: blood pressure returned to baseline and stable Postop Assessment: no apparent nausea or vomiting Anesthetic complications: no   No notable events documented.  Last Vitals:  Vitals:   08/30/22 1345 08/30/22 1400  BP: 109/69 108/72  Pulse: 76 76  Resp: 10 10  Temp:  36.5 C  SpO2: 99% 97%    Last Pain:  Vitals:   08/30/22 1400  TempSrc:   PainSc: 4                  Mckenze Slone,W. EDMOND

## 2022-08-30 NOTE — H&P (Signed)
CC: here for surgery  Requesting provider: n/a  HPI: Jordan Russell is an 51 y.o. female who is here for diagnostic laparoscopy, upper endoscopy, possible small bowel resection.  Patient has a very remote history of laparoscopic Roux-en-Y gastric bypass at an outside institution.  I saw her last summer with complaints of sensation of food sitting in her lower chest.  She when she would eat food would come back up.  She was not able to tolerate solid foods or thick foods or even thick liquids.  She was also having lower abdominal pain with diarrhea.  Her preoperative imaging showed that her gastric pouch was in her chest to a hiatal hernia.  He also complained of some thing coming back up in her throat if she were to lay flat at night.  She had had manometry back in 2016.  October 2022 she underwent robotic assisted hiatal hernia repair with mesh, robotic lysis of adhesions.  She recovered and a lot of her her foregut symptoms improved.  She came back to see me this fall with complaints of significant weight gain since her hiatal hernia repair.  She also complained of severe abdominal bloating and swelling.  She states that her abdomen will get distended.  She would have pain in her left mid abdomen.  She would alternate between periods of constipation and diarrhea.  At times she would also have fecal incontinence.  She reports a remote history of urinary and fecal incontinence and she had pelvic floor repair many years ago she states.  She states that when she gets abdominal distention that is when she will start having regurgitation about 15 to 30 minutes after eating.  She also complains of heartburn at times.  She has had several CT scans as well as a repeat upper GI.  Upper GI showed no evidence of a gastrogastric fistula.  There is no evidence of anastomotic stricture or leak or ulcer.  There is no evidence of recurrent hernia.  Normal esophageal distensibility with no evidence of mass ulcer  or stricture.  Mild esophageal dysmotility.  There was evidence of reflux to the level of thoracic inlet.  She did have delayed small bowel transit time of 3 hours but no unexpected filling defect, kink, fistula or small bowel dilatation.  On prior CT imaging in September a large colonic stool burden in the gastric pouch was noted to be closely abutting the excluded stomach, frothy fluid was in the intrathoracic esophagus. Past Medical History:  Diagnosis Date   Abnormal involuntary movement 10/01/2021   Acute liver failure without hepatic coma    AKI (acute kidney injury) 06/05/2018   Alopecia 10/01/2021   Anasarca 06/08/2018   Arthralgia of multiple joints 12/17/2016   Asthma    Ataxia 10/01/2021   Atypical chest pain 10/01/2021   Bilateral knee pain 02/05/2014   Bipolar disorder, unspecified 08/03/2014   Calculus of kidney 12/17/2016   Carpal tunnel syndrome of left wrist 12/17/2016   Cholestasis 10/01/2021   Chondromalacia 02/26/2014   Chronic idiopathic constipation 10/01/2021   Chronic kidney disease    Chronic rhinitis 10/01/2021   Colitis 06/10/2018   Contact dermatitis 10/01/2021   Corn of toe 10/01/2021   Cutaneous eruption 12/17/2016   Delirium due to multiple etiologies 06/11/2018   Drug-induced liver injury 06/08/2018   E coli bacteremia    Edema of lower extremity 10/01/2021   Elevated LFTs 06/08/2018   Encephalopathy, hepatic (HCC)    Enthesopathy of hip region 10/01/2021  Epigastric pain 06/04/2015   Eustachian tube disorder 10/01/2021   Fatigue 10/01/2021   Fatty infiltration of liver 06/05/2018   Fibromyalgia    Gastroparesis    Generalized anxiety disorder 03/10/2016   GERD (gastroesophageal reflux disease)    HCAP (healthcare-associated pneumonia) 06/09/2018   Hemorrhoid 10/01/2021   Hiatal hernia    Hidradenitis 08/03/2014   History of gastric bypass 12/14/2013   History of kidney stones    History of nutritional disorder 08/03/2014   HPTH  (hyperparathyroidism) 12/17/2016   Hydradenitis 12/17/2016   Hydronephrosis with renal and ureteral calculus obstruction 12/17/2016   Hyperammonemia 06/08/2018   Hyperbilirubinemia 06/05/2018   Hyperesthesia 10/01/2021   Hyperlipidemia 10/01/2021   Hypoalbuminemia 06/10/2018   Hypotension    Hypothyroidism    Hypoxia 06/05/2018   IBS (irritable bowel syndrome)    Insomnia 10/01/2021   Intractable migraine with aura without status migrainosus 12/17/2016   Iron deficiency anemia 12/17/2016   Jaundice 06/10/2018   Left ureteral stone    Major depressive disorder    s/p  bilateral inferior parathyroidectomy 08-03-2010   Microhematuria 01/04/2019   Nocturia 12/17/2016   Pain in left hip 09/08/2017   Peripheral venous insufficiency 10/01/2021   Pharyngeal dysphagia 10/01/2021   Pneumonia    Polyneuropathy 10/01/2021   PONV (postoperative nausea and vomiting)    and claustrophobic with mask   Pruritus 10/01/2021   Recurrent falls 10/01/2021   Rheumatoid arthritis 08/03/2014   Self mutilating behavior    Severe malnutrition    Severe sepsis 06/04/2018   Sinus bradycardia 06/29/2021   Status post bariatric surgery 12/14/2013   Trochanteric bursitis, left hip 09/08/2017   Urgency of urination    Varicose veins of bilateral lower extremities with pain 06/28/2016   Vitamin B12 deficiency (non anemic) 10/01/2021   Vitamin D deficiency 12/14/2013   Vulvitis 10/01/2021   Wears glasses     Past Surgical History:  Procedure Laterality Date   ABDOMINAL HYSTERECTOMY     BALLOON DILATION N/A 10/01/2014   Procedure: BALLOON DILATION;  Surgeon: Garlan Fair, MD;  Location: [facility];  Service: Endoscopy;  Laterality: N/A;   BIOPSY  03/05/2021   Procedure: BIOPSY;  Surgeon: Ronnette Juniper, MD;  Location: [facility];  Service: Gastroenterology;;   CYSTO/  BILATERAL RETROGRADE PYELOGRAM  11/20/2000   CYSTO/  RIGHT RETROGRADE PYELOGRAM/  RIGHT URETEROSCOPY/  STENT PLACEMENT  12/16/2006    CYSTOSCOPY/URETEROSCOPY/HOLMIUM LASER/STENT PLACEMENT Left 11/09/2016   Procedure: CYSTOSCOPY/URETEROSCOPY/HOLMIUM LASER/STENT PLACEMENT;  Surgeon: Nickie Retort, MD;  Location: [facility];  Service: Urology;  Laterality: Left;  90 MINS  [phone]    ESOPHAGEAL MANOMETRY N/A 12/23/2014   Procedure: ESOPHAGEAL MANOMETRY (EM);  Surgeon: Garlan Fair, MD;  Location: [facility];  Service: Endoscopy;  Laterality: N/A;   ESOPHAGOGASTRODUODENOSCOPY  last one 03-28-2015   ESOPHAGOGASTRODUODENOSCOPY (EGD) WITH PROPOFOL N/A 10/01/2014   Procedure: ESOPHAGOGASTRODUODENOSCOPY (EGD) WITH PROPOFOL;  Surgeon: Garlan Fair, MD;  Location: [facility];  Service: Endoscopy;  Laterality: N/A;   ESOPHAGOGASTRODUODENOSCOPY (EGD) WITH PROPOFOL N/A 03/05/2021   Procedure: ESOPHAGOGASTRODUODENOSCOPY (EGD) WITH PROPOFOL;  Surgeon: Ronnette Juniper, MD;  Location: [facility];  Service: Gastroenterology;  Laterality: N/A;   EXTRACORPOREAL SHOCK WAVE LITHOTRIPSY  multiple times since age 25   HOLMIUM LASER APPLICATION Left 04/04/4033   Procedure: HOLMIUM LASER APPLICATION;  Surgeon: Nickie Retort, MD;  Location: [facility];  Service: Urology;  Laterality: Left;   KNEE ARTHROSCOPY Right 03-04-2014  [facility]   w/ Arthrotomy  LAPAROSCOPIC ASSISTED VAGINAL HYSTERECTOMY  12/28/2005   LAPAROSCOPIC CHOLECYSTECTOMY  01/17/2004   LAPAROSCOPY LEFT OVARIAN CYSTECTOMY/  BILATERAL TUBAL LIGATION  02/26/2003   LEFT URETEROSCOPIC STONE EXTRACTION /  STENT PLACEMENT  07/09/2002   NECK EXPLORATION/  BILATERAL INFERIOR PARATHYROIDECTOMY  08/03/2010   RIGHT URETERAL DILATION/  URETEROSCOPIC STONE EXTRACTION  06/12/2010   ROUX-EN-Y GASTRIC BYPASS  2001   SOLYX TRANSURETHRAL SLING/  POSTERIOR PELVIC FLOOR SACROSPINOUS REPAIR  02/21/2009   and Cysto/  Bilateral ureteral stent placement   TONSILLECTOMY AND ADENOIDECTOMY     UPPER GI ENDOSCOPY N/A 08/10/2021   Procedure: UPPER GI ENDOSCOPY;   Surgeon: Greer Pickerel, MD;  Location: [facility];  Service: General;  Laterality: N/A;   WRIST GANGLION EXCISION Right 01/28/2000   XI ROBOTIC ASSISTED HIATAL HERNIA REPAIR N/A 08/10/2021   Procedure: XI ROBOTIC ASSISTED HIATAL HERNIA REPAIR WITH MESH, ROBOTIC LYSIS OF ADHESIONS;  Surgeon: Greer Pickerel, MD;  Location: [facility];  Service: General;  Laterality: N/A;    Family History  Problem Relation Age of Onset   Bipolar disorder Son    Breast cancer Mother    Breast cancer Sister    Anxiety disorder Brother    Depression Brother    Breast cancer Maternal Aunt    Breast cancer Maternal Grandmother     Social:  reports that she quit smoking about 6 months ago. Her smoking use included cigarettes. She has a 20.00 pack-year smoking history. She has never used smokeless tobacco. She reports that she does not currently use alcohol. She reports that she does not use drugs.  Allergies:  Allergies  Allergen Reactions   Gabapentin Swelling    Leg swelling Liver failure  Pt currently taking gabapentin   Sulfa Antibiotics Hives and Other (See Comments)    Other reaction(s): hives   Sulfasalazine Hives   Lamictal [Lamotrigine] Nausea And Vomiting and Other (See Comments)    Tremors, diplopia   Lithium Other (See Comments)    Vomiting, tremors   Penicillins Hives, Itching and Other (See Comments)    As a child, "breathing, itching problem" Has patient had a PCN reaction causing immediate rash, facial/tongue/throat swelling, SOB or lightheadedness with hypotension: Yes Has patient had a PCN reaction causing severe rash involving mucus membranes or skin necrosis: Yes Has patient had a PCN reaction that required hospitalization No Has patient had a PCN reaction occurring within the last 10 years: No If all of the above answers are "NO", then may proceed with Cephalosporin use.    Tylenol [Acetaminophen]     Due to liver failure    Morphine And Related Itching and Rash   Sulfur Itching     Medications: I have reviewed the patient's current medications.   ROS - all of the below systems have been reviewed with the patient and positives are indicated with bold text General: chills, fever or night sweats Eyes: blurry vision or double vision ENT: epistaxis or sore throat Allergy/Immunology: itchy/watery eyes or nasal congestion Hematologic/Lymphatic: bleeding problems, blood clots or swollen lymph nodes Endocrine: temperature intolerance or unexpected weight changes Breast: new or changing breast lumps or nipple discharge Resp: cough, shortness of breath, or wheezing CV: chest pain or dyspnea on exertion GI: as per HPI GU: dysuria, trouble voiding, or hematuria MSK: joint pain or joint stiffness Neuro: TIA or stroke symptoms Derm: pruritus and skin lesion changes Psych: anxiety and depression  PE Blood pressure 101/62, pulse 72, temperature 97.7 F (36.5 C), temperature source Oral, resp. rate 16,  height 5' 6.5" (1.689 m), weight 92 kg, SpO2 98 %. Constitutional: NAD; conversant; no deformities, obese Eyes: Moist conjunctiva; no lid lag; anicteric; PERRL Neck: Trachea midline; no thyromegaly Lungs: Normal respiratory effort; no tactile fremitus CV: RRR; no palpable thrills; no pitting edema GI: Abd soft, nontender, nondistended; no palpable hepatosplenomegaly MSK: Normal gait; no clubbing/cyanosis Psychiatric: Appropriate affect; alert and oriented x3 Lymphatic: No palpable cervical or axillary lymphadenopathy Skin: No rash, lesions or jaundice  Results for orders placed or performed during the hospital encounter of 08/30/22 (from the past 48 hour(s))  Type and screen Ages     Status: None (Preliminary result)   Collection Time: 08/30/22  9:44 AM  Result Value Ref Range   ABO/RH(D) PENDING    Antibody Screen PENDING    Sample Expiration      09/02/2022,2359 Performed at [facility], [place] [address]  [place], [place] [address]     No results found.  Imaging: Personally reviewed  A/P: MALCOLM HETZ is an 51 y.o. female with  Abdominal bloating and pain Heartburn Fecal incontinence Constipation Remote history of laparoscopic Roux-en-Y gastric bypass History of robotic hiatal hernia repair with mesh and lysis of adhesions October 2022  We have had several extensive conversations.  Plan today is mainly for diagnostic purposes.  Plan will be to do a diagnostic laparoscopy make sure there is not a adhesive band, I get a sense of how dilated her jejunojejunostomy is, see how long her candycane limb is-possibly resect her candycane if it appears she has a long candycane that is dilated.  Perform an upper endoscopy look for evidence of ulcers, esophagitis, get a sense of how wide her gastrojejunostomy is with possible biopsy  We discussed that today is more for work-up and to gather additional information and is more diagnostic than therapeutic.  I think she may have more than 1 thing going on since she has issues with fecal incontinence at times and has a remote history of pelvic floor disorder.  Her upper GI showed a delayed small bowel transit time of 3 hours plus she has significant stool burden on imaging so I think part of her underlying problem may be some type of motility issue  I previously discussed the risk and benefits I offered to do that again today but she declined.  Jordan Ruff. Redmond Pulling, MD, [place], Bariatric, & Minimally Invasive Surgery [facility],  Jordan Redmond Pulling, MD, FACS General, Bariatric, & Minimally Invasive Surgery [facility]

## 2022-08-31 ENCOUNTER — Encounter (HOSPITAL_COMMUNITY): Payer: Self-pay | Admitting: General Surgery

## 2022-08-31 DIAGNOSIS — K9589 Other complications of other bariatric procedure: Secondary | ICD-10-CM | POA: Diagnosis not present

## 2022-08-31 DIAGNOSIS — J45909 Unspecified asthma, uncomplicated: Secondary | ICD-10-CM | POA: Diagnosis not present

## 2022-08-31 DIAGNOSIS — Z9889 Other specified postprocedural states: Secondary | ICD-10-CM | POA: Diagnosis not present

## 2022-08-31 DIAGNOSIS — E039 Hypothyroidism, unspecified: Secondary | ICD-10-CM | POA: Diagnosis not present

## 2022-08-31 DIAGNOSIS — R109 Unspecified abdominal pain: Secondary | ICD-10-CM | POA: Diagnosis not present

## 2022-08-31 DIAGNOSIS — Z87891 Personal history of nicotine dependence: Secondary | ICD-10-CM | POA: Diagnosis not present

## 2022-08-31 DIAGNOSIS — N189 Chronic kidney disease, unspecified: Secondary | ICD-10-CM | POA: Diagnosis not present

## 2022-08-31 DIAGNOSIS — Z9884 Bariatric surgery status: Secondary | ICD-10-CM | POA: Diagnosis not present

## 2022-08-31 DIAGNOSIS — I129 Hypertensive chronic kidney disease with stage 1 through stage 4 chronic kidney disease, or unspecified chronic kidney disease: Secondary | ICD-10-CM | POA: Diagnosis not present

## 2022-08-31 LAB — BASIC METABOLIC PANEL
Anion gap: 7 (ref 5–15)
BUN: 13 mg/dL (ref 6–20)
CO2: 20 mmol/L — ABNORMAL LOW (ref 22–32)
Calcium: 9 mg/dL (ref 8.9–10.3)
Chloride: 112 mmol/L — ABNORMAL HIGH (ref 98–111)
Creatinine, Ser: 0.79 mg/dL (ref 0.44–1.00)
GFR, Estimated: >60 mL/min (ref >60)
Glucose, Bld: 153 mg/dL — ABNORMAL HIGH (ref 70–99)
Potassium: 5.6 mmol/L — ABNORMAL HIGH (ref 3.5–5.1)
Sodium: 139 mmol/L (ref 135–145)

## 2022-08-31 LAB — CBC
HCT: 42.5 % (ref 36.0–46.0)
Hemoglobin: 13.5 g/dL (ref 12.0–15.0)
MCH: 32.8 pg (ref 26.0–34.0)
MCHC: 31.8 g/dL (ref 30.0–36.0)
MCV: 103.4 fL — ABNORMAL HIGH (ref 80.0–100.0)
Platelets: 242 10*3/uL (ref 150–400)
RBC: 4.11 MIL/uL (ref 3.87–5.11)
RDW: 12 % (ref 11.5–15.5)
WBC: 8.9 10*3/uL (ref 4.0–10.5)
nRBC: 0 % (ref 0.0–0.2)

## 2022-08-31 LAB — SURGICAL PATHOLOGY

## 2022-08-31 MED ORDER — ENSURE MAX PROTEIN PO LIQD
11.0000 [oz_av] | Freq: Two times a day (BID) | ORAL | Status: DC
  Administered 2022-08-31: 11 [oz_av] via ORAL

## 2022-08-31 MED ORDER — OXYCODONE HCL 5 MG PO TABS: 5.0000 mg | ORAL_TABLET | Freq: Four times a day (QID) | ORAL | 0 refills | Status: DC | PRN

## 2022-08-31 MED ORDER — ONDANSETRON 8 MG PO TBDP: 8.0000 mg | ORAL_TABLET | Freq: Four times a day (QID) | ORAL | 0 refills | Status: AC | PRN

## 2022-08-31 MED FILL — Lithium Carbonate Cap 300 MG: ORAL | Qty: 1 | Status: AC

## 2022-08-31 NOTE — Discharge Summary (Signed)
Physician Discharge Summary  Jordan Russell ZOX:[id number] DOB: 25-Mar-1971 DOA: 08/30/2022  PCP: Charlane Ferretti, MD  Admit date: 08/30/2022 Discharge date: 08/31/2022  Recommendations for Outpatient Follow-up:    Follow-up Information     Greer Pickerel, MD. Schedule an appointment as soon as possible for a visit in 3 week(s).   Specialty: General Surgery Why: For wound re-check Contact information: [address] [place] [address] [phone]                Discharge Diagnoses:  Principal Problem:   S/P laparoscopy Abdominal pain Regurgitation Remote history of laparoscopic Roux-en-Y gastric bypass History of robotic hiatal hernia repair with mesh October 2022 Severe obesity  Surgical Procedure: LAPAROSCOPY DIAGNOSTIC, LAPAROSCOPIC RESECTION OF CANDY CANE LIMB UPPER GI ENDOSCOPY  Discharge Condition: Good Disposition: Home  Diet recommendation: Bariatric full liquid diet  Filed Weights   08/30/22 0923  Weight: 92 kg     Hospital Course:  Patient was brought in for planned diagnostic laparoscopy, upper endoscopy, possible bowel resection.  During surgery she was found to have a dilated candycane limb of her Roux limb.  The blind end of her Roux limb preferentially field on her preoperative upper GI.  On endoscopy during surgery the endoscope preferentially went down the blind end of her Roux limb.  The gastrojejunostomy was widely patent.  There is no signs of internal hernia.  She underwent resection of the blind end of her Roux limb AKA the candycane limb.  She was kept overnight for observation.  On postop day 1 she was doing well.  She was tolerating a full liquid diet.  She was ambulating.  Her pain was controlled with oral pain medicine.  Her vital signs are stable.  I discussed the intraoperative findings with her using her own preoperative imaging as well as diagrams from the Internet.  I discussed wound care after surgery.  I discussed  her discharge diet.  I discussed what to call for. Morning metabolic panel was hemolyzed I did not feel the need to repeat it.  BP (!) 89/67 (BP Location: Left Arm)   Pulse 77   Temp (!) 97.4 F (36.3 C) (Oral)   Resp 18   Ht 5' 6.5" (1.689 m)   Wt 92 kg   SpO2 100%   BMI 32.25 kg/m   Gen: alert, NAD, non-toxic appearing Pupils: equal, no scleral icterus Pulm: Lungs clear to auscultation, symmetric chest rise CV: regular rate and rhythm Abd: soft, mild approp tender, nondistended.  No cellulitis. No incisional hernia Ext: no edema, no calf tenderness Skin: no rash, no jaundice    Discharge Instructions  Discharge Instructions     Ambulate hourly while awake   Complete by: As directed    Call MD for:  difficulty breathing, headache or visual disturbances   Complete by: As directed    Call MD for:  persistant dizziness or light-headedness   Complete by: As directed    Call MD for:  persistant nausea and vomiting   Complete by: As directed    Call MD for:  redness, tenderness, or signs of infection (pain, swelling, redness, odor or green/yellow discharge around incision site)   Complete by: As directed    Call MD for:  severe uncontrolled pain   Complete by: As directed    Call MD for:  temperature >101 F   Complete by: As directed    Diet bariatric full liquid   Complete by: As directed  Discharge instructions   Complete by: As directed    See bariatric discharge instructions   Incentive spirometry   Complete by: As directed    Perform hourly while awake      Allergies as of 08/31/2022       Reactions   Gabapentin Swelling   Leg swelling Liver failure Pt currently taking gabapentin   Sulfa Antibiotics Hives, Other (See Comments)   Other reaction(s): hives   Sulfasalazine Hives   Lamictal [lamotrigine] Nausea And Vomiting, Other (See Comments)   Tremors, diplopia   Lithium Other (See Comments)   Vomiting, tremors   Penicillins Hives, Itching, Other  (See Comments)   As a child, "breathing, itching problem" Has patient had a PCN reaction causing immediate rash, facial/tongue/throat swelling, SOB or lightheadedness with hypotension: Yes Has patient had a PCN reaction causing severe rash involving mucus membranes or skin necrosis: Yes Has patient had a PCN reaction that required hospitalization No Has patient had a PCN reaction occurring within the last 10 years: No If all of the above answers are "NO", then may proceed with Cephalosporin use.   Tylenol [acetaminophen]    Due to liver failure   Morphine And Related Itching, Rash   Sulfur Itching        Medication List     TAKE these medications    ALPRAZolam 1 MG tablet Commonly known as: XANAX Take 1 mg by mouth 3 (three) times daily as needed for anxiety.   dicyclomine 20 MG tablet Commonly known as: BENTYL Take 1 tablet (20 mg total) by mouth 3 (three) times daily before meals. What changed:  when to take this reasons to take this   escitalopram 20 MG tablet Commonly known as: LEXAPRO Take 20 mg by mouth in the morning.   esomeprazole 40 MG capsule Commonly known as: NEXIUM Take 40 mg by mouth 2 (two) times daily.   furosemide 20 MG tablet Commonly known as: LASIX Take 20 mg by mouth in the morning.   gabapentin 100 MG capsule Commonly known as: NEURONTIN Take 1 capsule (100 mg total) by mouth 3 (three) times daily.   glycerin adult 2 g suppository Place 1 suppository rectally as needed for constipation.   lactulose 10 GM/15ML solution Commonly known as: CHRONULAC Place 45 mLs (30 g total) into feeding tube every 6 (six) hours.   levothyroxine 88 MCG tablet Commonly known as: SYNTHROID Take 88 mcg by mouth daily before breakfast.   Linzess 290 MCG Caps capsule Generic drug: linaclotide Take 290 mcg by mouth daily before breakfast.   lithium 300 MG tablet Take 300 mg by mouth 2 (two) times daily.   MAGNESIUM CITRATE PO Take 1 capsule by mouth  daily.   Metamucil 0.52 g capsule Generic drug: psyllium Take 1 capsule (0.52 g total) by mouth daily.   midodrine 2.5 MG tablet Commonly known as: PROAMATINE Take 1 tablet (2.5 mg total) by mouth every 6 (six) hours as needed (low b/p).   ondansetron 8 MG disintegrating tablet Commonly known as: ZOFRAN-ODT Take 1 tablet (8 mg total) by mouth every 6 (six) hours as needed for nausea.   oxyCODONE 5 MG immediate release tablet Commonly known as: Oxy IR/ROXICODONE Take 1 tablet (5 mg total) by mouth every 6 (six) hours as needed for severe pain.   polyethylene glycol 17 g packet Commonly known as: MIRALAX / GLYCOLAX Take 17 g by mouth daily. For bowel cleanout, mix 6-8 packets in a large Gatorade or similar drink.  QUEtiapine 100 MG tablet Commonly known as: SEROQUEL Take 400-600 mg by mouth at bedtime.   Refresh Optive Mega-3 0.5-1-0.5 % Soln Generic drug: Carboxymeth-Glyc-Polysorb PF Place 1 drop into both eyes 3 (three) times daily as needed (dry/irritated eyes.).   rifaximin 550 MG Tabs tablet Commonly known as: XIFAXAN Take 550 mg by mouth 2 (two) times daily.   rizatriptan 10 MG tablet Commonly known as: MAXALT Take 10 mg by mouth every 2 (two) hours as needed for migraine.   spironolactone 50 MG tablet Commonly known as: ALDACTONE Take 50 mg by mouth in the morning.   topiramate 25 MG tablet Commonly known as: TOPAMAX Take 25 mg by mouth 2 (two) times daily.   ursodiol 500 MG tablet Commonly known as: ACTIGALL Take 500 mg by mouth in the morning and at bedtime.   Vraylar 1.5 MG capsule Generic drug: cariprazine Take 1.5 mg by mouth at bedtime.        Follow-up Information     Greer Pickerel, MD. Schedule an appointment as soon as possible for a visit in 3 week(s).   Specialty: General Surgery Why: For wound re-check Contact information: [address] [place] [address] [phone]                  The results of  significant diagnostics from this hospitalization (including imaging, microbiology, ancillary and laboratory) are listed below for reference.    Significant Diagnostic Studies: DG UGI W SMALL BOWEL  Result Date: 08/06/2022 CLINICAL DATA:  History of gastric bypass surgery in 2001, hiatal hernia repair in 2022, now with recurrent vomiting and left and right lower quadrant postprandial abdominal pain. EXAM: UPPER GI SERIES WITH SMALL BOWEL FOLLOW-THROUGH FLUOROSCOPY: Radiation Exposure Index (as provided by the fluoroscopic device): 130 mGy Kerma TECHNIQUE: Combined double contrast and single contrast upper GI series using effervescent crystals, thick barium, and thin barium. Subsequently, serial images of the small bowel were obtained including spot views of the terminal ileum. COMPARISON:  07/13/2022 CT chest, abdomen and pelvis FINDINGS: Scout radiograph of demonstrates cholecystectomy clips in the right upper quadrant, surgical sutures in the medial left upper quadrant and no evidence of pneumatosis or pneumoperitoneum. No dilated small bowel loops. Diffuse large colonic stool and gas. Mild-to-moderate lumbar spondylosis. Status post Roux-en-Y gastric bypass surgery with intact gastrojejunostomy with no evidence of anastomotic stricture, leak or ulcer. No evidence of gastrogastric fistula to the excluded distal stomach. No evidence of recurrent hiatal hernia. Thoracic esophagus is mildly patulous. Normal esophageal mucosa. Normal esophageal distensibility, with no evidence of esophageal mass, ulcer or stricture. Mild esophageal dysmotility, characterized by intermittent mild weakening of primary peristalsis in the thoracic esophagus. Severe gastroesophageal reflux elicited to the level of the thoracic inlet with water siphon test. Intact enteroenterostomy noted in the right lower abdomen. Delayed small bowel transit time (3 hours). No small bowel fold thickening. No unexpected small bowel dilatation. No  unexpected focal small bowel caliber transition or kink. No small bowel fistula or filling defects. IMPRESSION: 1. Status post Roux-en-Y gastric bypass surgery without complication. No evidence of recurrent hiatal hernia. No evidence of gastrogastric fistula to the excluded distal stomach. 2. Severe gastroesophageal reflux elicited. 3. Mild esophageal dysmotility, compatible with chronic reflux related dysmotility. 4. Delayed small bowel transit (3 hours). No evidence of small bowel obstruction or other acute small bowel pathology. Electronically Signed   By: Ilona Sorrel M.D.   On: 08/06/2022 12:18    Labs: Basic Metabolic Panel: Recent  Labs  Lab 08/26/22 1357 08/31/22 0424  NA 141 139  K 4.3 5.6*  CL 115* 112*  CO2 20* 20*  GLUCOSE 92 153*  BUN 19 13  CREATININE 0.68 0.79  CALCIUM 8.6* 9.0   Liver Function Tests: Recent Labs  Lab 08/26/22 1357  AST 22  ALT 24  ALKPHOS 219*  BILITOT 0.6  PROT 6.4*  ALBUMIN 3.6    CBC: Recent Labs  Lab 08/26/22 1357 08/31/22 0753  WBC 4.9 8.9  NEUTROABS 2.8  --   HGB 13.2 13.5  HCT 41.0 42.5  MCV 103.5* 103.4*  PLT 255 242    CBG: No results for input(s): "GLUCAP" in the last 168 hours.  Principal Problem:   S/P laparoscopy   Time coordinating discharge: 25 min  Signed:  Gayland Curry, MD [facility], [place] [phone] 08/31/2022, 10:39 AM

## 2022-08-31 NOTE — Discharge Instructions (Signed)
GASTRIC BYPASS/SLEEVE  Home Care Instructions   These instructions are to help you care for yourself when you go home.  Call: If you have any problems. Call [phone] and ask for the surgeon on call If you need immediate help, come to the ER at [facility].  Tell the ER staff that you are a new post-op gastric bypass or gastric sleeve patient   Signs and symptoms to report: Severe vomiting or nausea If you cannot keep down clear liquids for longer than 1 day, call your surgeon  Abdominal pain that does not get better after taking your pain medication Fever over 100.4 F with chills Heart beating over 100 beats a minute Shortness of breath at rest Chest pain  Redness, swelling, drainage, or foul odor at incision (surgical) sites  If your incisions open or pull apart Swelling or pain in calf (lower leg) Diarrhea (Loose bowel movements that happen often), frequent watery, uncontrolled bowel movements Constipation, (no bowel movements for 3 days) if this happens: Pick one Milk of Magnesia, 2 tablespoons by mouth, 3 times a day for 2 days if needed Stop taking Milk of Magnesia once you have a bowel movement Call your doctor if constipation continues Or Miralax  (instead of Milk of Magnesia) following the label instructions Stop taking Miralax once you have a bowel movement Call your doctor if constipation continues Anything you think is not normal   Normal side effects after surgery: Unable to sleep at night or unable to focus Irritability or moody Being tearful (crying) or depressed These are common complaints, possibly related to your anesthesia medications that put you to sleep, stress of surgery, and change in lifestyle.  This usually goes away a few weeks after surgery.  If these feelings continue, call your primary care doctor.   Wound Care: You may have surgical glue, steri-strips, or staples over your incisions after surgery Surgical glue:  Looks like a clear film  over your incisions and will wear off a little at a time Steri-strips: Strips of tape over your incisions. You may notice a yellowish color on the skin under the steri-strips. This is used to make the   steri-strips stick better. Do not pull the steri-strips off - let them fall off Staples: Staples may be removed before you leave the hospital If you go home with staples, call [facility], [phone]) [phone] at for an appointment with your surgeon's nurse to have staples removed 10 days after surgery. Showering: You may shower two (2) days after your surgery unless your surgeon tells you differently Wash gently around incisions with warm soapy water, rinse well, and gently pat dry  No tub baths until staples are removed, steri-strips fall off or glue is gone.    Medications: Medications should be liquid or crushed if larger than the size of a dime Extended release pills (medication that release a little bit at a time through the day) should NOT be crushed or cut. (examples include XL, ER, DR, SR) Depending on the size and number of medications you take, you may need to space (take a few throughout the day)/change the time you take your medications so that you do not over-fill your pouch (smaller stomach) Make sure you follow-up with your primary care doctor to make medication changes needed during rapid weight loss and life-style changes If you have diabetes, follow up with the doctor that orders your diabetes medication(s) within one week after surgery and check your blood sugar regularly.  Do not drive while taking prescription pain medication  It is ok to take Tylenol by the bottle instructions with your pain medicine or instead of your pain medicine as needed.  DO NOT TAKE NSAIDS (EXAMPLES OF NSAIDS:  IBUPROFREN/ NAPROXEN)  Diet:                    First 2 Weeks   If you have severe vomiting or nausea and cannot keep down clear liquids lasting longer than 1 day, call your surgeon @  [phone]) Protein Shake Drink at least 2 ounces of shake 5-6 times per day Each serving of protein shakes (usually 8 - 12 ounces) should have: 15 grams of protein  And no more than 5 grams of carbohydrate  Goal for protein each day: Men = 80 grams per day Women = 60 grams per day Protein powder may be added to fluids such as non-fat milk or Lactaid milk or unsweetened Soy/Almond milk (limit to 35 grams added protein powder per serving)  Hydration Slowly increase the amount of water and other clear liquids as tolerated (See Acceptable Fluids) Slowly increase the amount of protein shake as tolerated   Sip fluids slowly and throughout the day.  Do not use straws. May use sugar substitutes in small amounts (no more than 6 - 8 packets per day; i.e. Splenda)  Fluid Goal The first goal is to drink at least 8 ounces of protein shake/drink per day (or as directed by the nutritionist); some examples of protein shakes are [employer], [employer], EAS Edge HP, and [place]jury. See handout from pre-op Bariatric Education Class: Slowly increase the amount of protein shake you drink as tolerated You may find it easier to slowly sip shakes throughout the day It is important to get your proteins in first Your fluid goal is to drink 64 - 100 ounces of fluid daily It may take a few weeks to build up to this 32 oz (or more) should be clear liquids  And  32 oz (or more) should be full liquids (see below for examples) Liquids should not contain sugar, caffeine, or carbonation  Clear Liquids: Water or Sugar-free flavored water (i.e. Fruit H2O, Propel) Decaffeinated coffee or tea (sugar-free) Crystal Lite, Wyler's Lite, Minute Maid Lite Sugar-free Jell-O Bouillon or broth Sugar-free Popsicle:   *Less than 20 calories each; Limit 1 per day  Full Liquids: Protein Shakes/Drinks + 2 choices per day of other full liquids Full liquids must be: No More Than 15 grams of Carbs per serving  No More  Than 3 grams of Fat per serving Strained low-fat cream soup (except Cream of Potato or Tomato) Non-Fat milk Fat-free Lactaid Milk Unsweetened Soy Or Unsweetened Almond Milk Low Sugar yogurt (Dannon Lite & Fit, Greek yogurt; Oikos Triple Zero; Chobani Simply 100; Yoplait 100 calorie [place] - No Fruit on the Bottom)    Vitamins and Minerals Start 1 day after surgery unless otherwise directed by your surgeon 2 Chewable Bariatric Specific Multivitamin / Multimineral Supplement with iron (Example: Bariatric Advantage Multi EA) Chewable Calcium with Vitamin D-3 (Example: 3 Chewable Calcium Plus 600 with Vitamin D-3) Take 500 mg three (3) times a day for a total of 1500 mg each day Do not take all 3 doses of calcium at one time as it may cause constipation, and you can only absorb 500 mg  at a time  Do not mix multivitamins containing iron with calcium supplements; take 2 hours apart Menstruating women and those with  a history of anemia (a blood disease that causes weakness) may need extra iron Talk with your doctor to see if you need more iron Do not stop taking or change any vitamins or minerals until you talk to your dietitian or surgeon Your Dietitian and/or surgeon must approve all vitamin and mineral supplements   Activity and Exercise: Limit your physical activity as instructed by your doctor.  It is important to continue walking at home.  During this time, use these guidelines: Do not lift anything greater than ten (10) pounds for at least two (2) weeks Do not go back to work or drive until Engineer, production says you can You may have sex when you feel comfortable  It is VERY important for female patients to use a reliable birth control method; fertility often increases after surgery  All hormonal birth control will be ineffective for 30 days after surgery due to medications given during surgery a barrier method must be used. Do not get pregnant for at least 18 months Start exercising as soon as  your doctor tells you that you can Make sure your doctor approves any physical activity Start with a simple walking program Walk 5-15 minutes each day, 7 days per week.  Slowly increase until you are walking 30-45 minutes per day Consider joining our [place] program. [phone] or email [email or web address]   Special Instructions Things to remember: Use your CPAP when sleeping if this applies to you  [facility] has two free Bariatric Surgery Support Groups that meet monthly The 3rd Thursday of each month, 6 pm, [place]  The 2nd Friday of each month, 11:45 am in the private dining room in the basement of [place] It is very important to keep all follow up appointments with your surgeon, dietitian, primary care physician, and behavioral health practitioner Routine follow up schedule with your surgeon include appointments at 2-3 weeks, 6-8 weeks, 6 months, and 1 year at a minimum.  Your surgeon may request to see you more often.   After the first year, please follow up with your bariatric surgeon and dietitian at least once a year in order to maintain best weight loss results [facility]: [place]: [phone] Bariatric Nurse Coordinator: [phone]      Reviewed and Endorsed  by [facility] Patient Education Committee, June, 2016 Edits Approved: Aug, 2018

## 2022-08-31 NOTE — Progress Notes (Signed)
Discharge instructions discussed with patient, verbalized agreement and understanding 

## 2022-09-03 ENCOUNTER — Other Ambulatory Visit: Payer: Self-pay | Admitting: Physician Assistant

## 2022-09-05 ENCOUNTER — Encounter (HOSPITAL_COMMUNITY): Payer: Self-pay | Admitting: Family Medicine

## 2022-09-05 ENCOUNTER — Other Ambulatory Visit: Payer: Self-pay

## 2022-09-05 ENCOUNTER — Emergency Department (HOSPITAL_COMMUNITY): Payer: BC Managed Care – PPO

## 2022-09-05 ENCOUNTER — Observation Stay (HOSPITAL_COMMUNITY)
Admission: EM | Admit: 2022-09-05 | Discharge: 2022-09-06 | Disposition: A | Discharge status: Home / Self Care | Payer: BC Managed Care – PPO | Attending: Internal Medicine | Admitting: Internal Medicine

## 2022-09-05 DIAGNOSIS — F319 Bipolar disorder, unspecified: Secondary | ICD-10-CM | POA: Diagnosis present

## 2022-09-05 DIAGNOSIS — R471 Dysarthria and anarthria: Secondary | ICD-10-CM | POA: Insufficient documentation

## 2022-09-05 DIAGNOSIS — G9340 Encephalopathy, unspecified: Secondary | ICD-10-CM | POA: Diagnosis not present

## 2022-09-05 DIAGNOSIS — R4182 Altered mental status, unspecified: Secondary | ICD-10-CM

## 2022-09-05 DIAGNOSIS — R4781 Slurred speech: Secondary | ICD-10-CM | POA: Diagnosis not present

## 2022-09-05 DIAGNOSIS — E039 Hypothyroidism, unspecified: Secondary | ICD-10-CM | POA: Diagnosis not present

## 2022-09-05 DIAGNOSIS — N189 Chronic kidney disease, unspecified: Secondary | ICD-10-CM | POA: Insufficient documentation

## 2022-09-05 DIAGNOSIS — Z87891 Personal history of nicotine dependence: Secondary | ICD-10-CM | POA: Insufficient documentation

## 2022-09-05 DIAGNOSIS — Z955 Presence of coronary angioplasty implant and graft: Secondary | ICD-10-CM | POA: Diagnosis not present

## 2022-09-05 DIAGNOSIS — Z9884 Bariatric surgery status: Secondary | ICD-10-CM | POA: Insufficient documentation

## 2022-09-05 DIAGNOSIS — R1084 Generalized abdominal pain: Secondary | ICD-10-CM | POA: Diagnosis not present

## 2022-09-05 DIAGNOSIS — Z9889 Other specified postprocedural states: Secondary | ICD-10-CM

## 2022-09-05 DIAGNOSIS — R0902 Hypoxemia: Secondary | ICD-10-CM | POA: Diagnosis not present

## 2022-09-05 DIAGNOSIS — R410 Disorientation, unspecified: Secondary | ICD-10-CM | POA: Diagnosis not present

## 2022-09-05 DIAGNOSIS — F411 Generalized anxiety disorder: Secondary | ICD-10-CM | POA: Diagnosis not present

## 2022-09-05 DIAGNOSIS — R109 Unspecified abdominal pain: Secondary | ICD-10-CM | POA: Diagnosis not present

## 2022-09-05 LAB — LITHIUM LEVEL: Lithium Lvl: 0.17 mmol/L — ABNORMAL LOW (ref 0.60–1.20)

## 2022-09-05 LAB — URINALYSIS, ROUTINE W REFLEX MICROSCOPIC
Bacteria, UA: NONE SEEN
Bilirubin Urine: NEGATIVE
Glucose, UA: NEGATIVE mg/dL
Hgb urine dipstick: NEGATIVE
Ketones, ur: NEGATIVE mg/dL
Nitrite: NEGATIVE
Protein, ur: NEGATIVE mg/dL
Specific Gravity, Urine: 1.02 (ref 1.005–1.030)
pH: 5 (ref 5.0–8.0)

## 2022-09-05 LAB — CBC WITH DIFFERENTIAL/PLATELET
Abs Immature Granulocytes: 0.03 10*3/uL (ref 0.00–0.07)
Basophils Absolute: 0.1 10*3/uL (ref 0.0–0.1)
Basophils Relative: 1 %
Eosinophils Absolute: 0.4 10*3/uL (ref 0.0–0.5)
Eosinophils Relative: 7 %
HCT: 40.3 % (ref 36.0–46.0)
Hemoglobin: 12.8 g/dL (ref 12.0–15.0)
Immature Granulocytes: 1 %
Lymphocytes Relative: 24 %
Lymphs Abs: 1.5 10*3/uL (ref 0.7–4.0)
MCH: 33.1 pg (ref 26.0–34.0)
MCHC: 31.8 g/dL (ref 30.0–36.0)
MCV: 104.1 fL — ABNORMAL HIGH (ref 80.0–100.0)
Monocytes Absolute: 0.5 10*3/uL (ref 0.1–1.0)
Monocytes Relative: 9 %
Neutro Abs: 3.7 10*3/uL (ref 1.7–7.7)
Neutrophils Relative %: 58 %
Platelets: 247 10*3/uL (ref 150–400)
RBC: 3.87 MIL/uL (ref 3.87–5.11)
RDW: 12.7 % (ref 11.5–15.5)
WBC: 6.2 10*3/uL (ref 4.0–10.5)
nRBC: 0 % (ref 0.0–0.2)

## 2022-09-05 LAB — COMPREHENSIVE METABOLIC PANEL
ALT: 25 U/L (ref 0–44)
AST: 20 U/L (ref 15–41)
Albumin: 3.5 g/dL (ref 3.5–5.0)
Alkaline Phosphatase: 262 U/L — ABNORMAL HIGH (ref 38–126)
Anion gap: 3 — ABNORMAL LOW (ref 5–15)
BUN: 20 mg/dL (ref 6–20)
CO2: 24 mmol/L (ref 22–32)
Calcium: 8.2 mg/dL — ABNORMAL LOW (ref 8.9–10.3)
Chloride: 116 mmol/L — ABNORMAL HIGH (ref 98–111)
Creatinine, Ser: 0.62 mg/dL (ref 0.44–1.00)
GFR, Estimated: >60 mL/min (ref >60)
Glucose, Bld: 98 mg/dL (ref 70–99)
Potassium: 3.8 mmol/L (ref 3.5–5.1)
Sodium: 143 mmol/L (ref 135–145)
Total Bilirubin: 0.7 mg/dL (ref 0.3–1.2)
Total Protein: 5.9 g/dL — ABNORMAL LOW (ref 6.5–8.1)

## 2022-09-05 LAB — CBG MONITORING, ED: Glucose-Capillary: 84 mg/dL (ref 70–99)

## 2022-09-05 LAB — PROTIME-INR
INR: 1.1 (ref 0.8–1.2)
Prothrombin Time: 14.4 seconds (ref 11.4–15.2)

## 2022-09-05 LAB — RAPID URINE DRUG SCREEN, HOSP PERFORMED
Amphetamines: NOT DETECTED
Barbiturates: NOT DETECTED
Benzodiazepines: POSITIVE — AB
Cocaine: NOT DETECTED
Opiates: NOT DETECTED
Tetrahydrocannabinol: POSITIVE — AB

## 2022-09-05 LAB — BLOOD GAS, VENOUS
Acid-base deficit: 2.9 mmol/L — ABNORMAL HIGH (ref 0.0–2.0)
Bicarbonate: 23.7 mmol/L (ref 20.0–28.0)
O2 Saturation: 52.1 %
Patient temperature: 37.1
pCO2, Ven: 47 mmHg (ref 44–60)
pH, Ven: 7.31 (ref 7.25–7.43)
pO2, Ven: 34 mmHg (ref 32–45)

## 2022-09-05 LAB — AMMONIA: Ammonia: 20 umol/L (ref 9–35)

## 2022-09-05 LAB — ETHANOL: Alcohol, Ethyl (B): <10 mg/dL (ref <10)

## 2022-09-05 MED ORDER — ACETAMINOPHEN 325 MG PO TABS: 650.0000 mg | ORAL_TABLET | Freq: Four times a day (QID) | ORAL | Status: DC | PRN

## 2022-09-05 MED ORDER — DICYCLOMINE HCL 20 MG PO TABS: 20.0000 mg | ORAL_TABLET | Freq: Three times a day (TID) | ORAL | Status: DC | PRN

## 2022-09-05 MED ORDER — POLYETHYLENE GLYCOL 3350 17 G PO PACK: 17.0000 g | PACK | Freq: Every day | ORAL | Status: DC | PRN

## 2022-09-05 MED ORDER — OXYCODONE HCL 5 MG PO TABS
5.0000 mg | ORAL_TABLET | Freq: Once | ORAL | Status: AC
  Administered 2022-09-05: 5 mg via ORAL
  Filled 2022-09-05: qty 1

## 2022-09-05 MED ORDER — ACETAMINOPHEN 650 MG RE SUPP: 650.0000 mg | Freq: Four times a day (QID) | RECTAL | Status: DC | PRN

## 2022-09-05 MED ORDER — SODIUM CHLORIDE 0.9 % IV SOLN
1000.0000 mL | INTRAVENOUS | Status: DC
  Administered 2022-09-05: 1000 mL via INTRAVENOUS

## 2022-09-05 MED ORDER — ENOXAPARIN SODIUM 40 MG/0.4ML IJ SOSY
40.0000 mg | PREFILLED_SYRINGE | INTRAMUSCULAR | Status: DC
  Administered 2022-09-06: 40 mg via SUBCUTANEOUS
  Filled 2022-09-05: qty 0.4

## 2022-09-05 MED ORDER — LINACLOTIDE 145 MCG PO CAPS
290.0000 ug | ORAL_CAPSULE | Freq: Every day | ORAL | Status: DC
  Filled 2022-09-05: qty 2

## 2022-09-05 MED ORDER — LEVOTHYROXINE SODIUM 88 MCG PO TABS
88.0000 ug | ORAL_TABLET | Freq: Every day | ORAL | Status: DC
  Filled 2022-09-05: qty 1

## 2022-09-05 MED ORDER — SODIUM CHLORIDE 0.9 % IV BOLUS (SEPSIS)
1000.0000 mL | Freq: Once | INTRAVENOUS | Status: AC
  Administered 2022-09-05: 1000 mL via INTRAVENOUS

## 2022-09-05 MED ORDER — PANTOPRAZOLE SODIUM 40 MG PO TBEC
40.0000 mg | DELAYED_RELEASE_TABLET | Freq: Two times a day (BID) | ORAL | Status: DC
  Administered 2022-09-06: 40 mg via ORAL
  Filled 2022-09-05: qty 1

## 2022-09-05 NOTE — ED Notes (Signed)
Pt swallowed water with her medication without difficulty. She tolerated well

## 2022-09-05 NOTE — H&P (Signed)
History and Physical    Jordan Russell OYD:[id number] DOB: Aug 13, 1971 DOA: 09/05/2022  PCP: Charlane Ferretti, MD   Patient coming from: Home   Chief Complaint: confusion, slurred speech   HPI: Jordan Russell is a 51 y.o. female with medical history significant for remote gastric bypass, resection of candycane limb of Roux-en-Y bypass on 08/30/2022, history of cognitive decline and neuropathy attributed to nutrient deficiencies, drug-induced liver injury in 2019, bipolar disorder, IBS, and hypothyroidism who now presents emergency department with confusion and slurred speech.  ***   ED Course: Upon arrival to the ED, patient is found to be afebrile and saturating well on room air with stable blood pressure.  Blood work notable for alkaline phosphatase 262, macrocytosis without anemia, normal ammonia level, undetectable ethanol, and low lithium level.  Head CT was normal.  Chest x-ray negative for acute cardiopulmonary disease.  KUB negative for SBO or free air.  MRI brain ordered but not yet performed.  Patient was given a liter of saline and 5 mg oxycodone in the ED.  Review of Systems:  All other systems reviewed and apart from HPI, are negative.  Past Medical History:  Diagnosis Date   Abnormal involuntary movement 10/01/2021   Acute liver failure without hepatic coma    AKI (acute kidney injury) 06/05/2018   Alopecia 10/01/2021   Anasarca 06/08/2018   Arthralgia of multiple joints 12/17/2016   Asthma    Ataxia 10/01/2021   Atypical chest pain 10/01/2021   Bilateral knee pain 02/05/2014   Bipolar disorder, unspecified 08/03/2014   Calculus of kidney 12/17/2016   Carpal tunnel syndrome of left wrist 12/17/2016   Cholestasis 10/01/2021   Chondromalacia 02/26/2014   Chronic idiopathic constipation 10/01/2021   Chronic kidney disease    Chronic rhinitis 10/01/2021   Colitis 06/10/2018   Contact dermatitis 10/01/2021   Corn of toe 10/01/2021   Cutaneous eruption  12/17/2016   Delirium due to multiple etiologies 06/11/2018   Drug-induced liver injury 06/08/2018   E coli bacteremia    Edema of lower extremity 10/01/2021   Elevated LFTs 06/08/2018   Encephalopathy, hepatic (HCC)    Enthesopathy of hip region 10/01/2021   Epigastric pain 06/04/2015   Eustachian tube disorder 10/01/2021   Fatigue 10/01/2021   Fatty infiltration of liver 06/05/2018   Fibromyalgia    Gastroparesis    Generalized anxiety disorder 03/10/2016   GERD (gastroesophageal reflux disease)    HCAP (healthcare-associated pneumonia) 06/09/2018   Hemorrhoid 10/01/2021   Hiatal hernia    Hidradenitis 08/03/2014   History of gastric bypass 12/14/2013   History of kidney stones    History of nutritional disorder 08/03/2014   HPTH (hyperparathyroidism) 12/17/2016   Hydradenitis 12/17/2016   Hydronephrosis with renal and ureteral calculus obstruction 12/17/2016   Hyperammonemia 06/08/2018   Hyperbilirubinemia 06/05/2018   Hyperesthesia 10/01/2021   Hyperlipidemia 10/01/2021   Hypoalbuminemia 06/10/2018   Hypotension    Hypothyroidism    Hypoxia 06/05/2018   IBS (irritable bowel syndrome)    Insomnia 10/01/2021   Intractable migraine with aura without status migrainosus 12/17/2016   Iron deficiency anemia 12/17/2016   Jaundice 06/10/2018   Left ureteral stone    Major depressive disorder    s/p  bilateral inferior parathyroidectomy 08-03-2010   Microhematuria 01/04/2019   Nocturia 12/17/2016   Pain in left hip 09/08/2017   Peripheral venous insufficiency 10/01/2021   Pharyngeal dysphagia 10/01/2021   Pneumonia    Polyneuropathy 10/01/2021   PONV (postoperative nausea and vomiting)  and claustrophobic with mask   Pruritus 10/01/2021   Recurrent falls 10/01/2021   Rheumatoid arthritis 08/03/2014   Self mutilating behavior    Severe malnutrition    Severe sepsis 06/04/2018   Sinus bradycardia 06/29/2021   Status post bariatric surgery 12/14/2013   Trochanteric  bursitis, left hip 09/08/2017   Urgency of urination    Varicose veins of bilateral lower extremities with pain 06/28/2016   Vitamin B12 deficiency (non anemic) 10/01/2021   Vitamin D deficiency 12/14/2013   Vulvitis 10/01/2021   Wears glasses     Past Surgical History:  Procedure Laterality Date   ABDOMINAL HYSTERECTOMY     BALLOON DILATION N/A 10/01/2014   Procedure: BALLOON DILATION;  Surgeon: Garlan Fair, MD;  Location: [facility];  Service: Endoscopy;  Laterality: N/A;   BIOPSY  03/05/2021   Procedure: BIOPSY;  Surgeon: Ronnette Juniper, MD;  Location: [facility];  Service: Gastroenterology;;   CYSTO/  BILATERAL RETROGRADE PYELOGRAM  11/20/2000   CYSTO/  RIGHT RETROGRADE PYELOGRAM/  RIGHT URETEROSCOPY/  STENT PLACEMENT  12/16/2006   CYSTOSCOPY/URETEROSCOPY/HOLMIUM LASER/STENT PLACEMENT Left 11/09/2016   Procedure: CYSTOSCOPY/URETEROSCOPY/HOLMIUM LASER/STENT PLACEMENT;  Surgeon: Nickie Retort, MD;  Location: [facility];  Service: Urology;  Laterality: Left;  90 MINS  [phone]    ESOPHAGEAL MANOMETRY N/A 12/23/2014   Procedure: ESOPHAGEAL MANOMETRY (EM);  Surgeon: Garlan Fair, MD;  Location: [facility];  Service: Endoscopy;  Laterality: N/A;   ESOPHAGOGASTRODUODENOSCOPY  last one 03-28-2015   ESOPHAGOGASTRODUODENOSCOPY (EGD) WITH PROPOFOL N/A 10/01/2014   Procedure: ESOPHAGOGASTRODUODENOSCOPY (EGD) WITH PROPOFOL;  Surgeon: Garlan Fair, MD;  Location: [facility];  Service: Endoscopy;  Laterality: N/A;   ESOPHAGOGASTRODUODENOSCOPY (EGD) WITH PROPOFOL N/A 03/05/2021   Procedure: ESOPHAGOGASTRODUODENOSCOPY (EGD) WITH PROPOFOL;  Surgeon: Ronnette Juniper, MD;  Location: [facility];  Service: Gastroenterology;  Laterality: N/A;   EXTRACORPOREAL SHOCK WAVE LITHOTRIPSY  multiple times since age 23   HOLMIUM LASER APPLICATION Left 05/05/1024   Procedure: HOLMIUM LASER APPLICATION;  Surgeon: Nickie Retort, MD;  Location: [facility];  Service:  Urology;  Laterality: Left;   KNEE ARTHROSCOPY Right 03-04-2014  [facility]   w/ Arthrotomy   LAPAROSCOPIC ASSISTED VAGINAL HYSTERECTOMY  12/28/2005   LAPAROSCOPIC CHOLECYSTECTOMY  01/17/2004   LAPAROSCOPY N/A 08/30/2022   Procedure: LAPAROSCOPY DIAGNOSTIC, LAPAROSCOPIC RESECTION OF CANDY CANE LIMB;  Surgeon: Greer Pickerel, MD;  Location: [facility];  Service: General;  Laterality: N/A;   LAPAROSCOPY LEFT OVARIAN CYSTECTOMY/  BILATERAL TUBAL LIGATION  02/26/2003   LEFT URETEROSCOPIC STONE EXTRACTION /  STENT PLACEMENT  07/09/2002   NECK EXPLORATION/  BILATERAL INFERIOR PARATHYROIDECTOMY  08/03/2010   RIGHT URETERAL DILATION/  URETEROSCOPIC STONE EXTRACTION  06/12/2010   ROUX-EN-Y GASTRIC BYPASS  2001   SOLYX TRANSURETHRAL SLING/  POSTERIOR PELVIC FLOOR SACROSPINOUS REPAIR  02/21/2009   and Cysto/  Bilateral ureteral stent placement   TONSILLECTOMY AND ADENOIDECTOMY     UPPER GI ENDOSCOPY N/A 08/10/2021   Procedure: UPPER GI ENDOSCOPY;  Surgeon: Greer Pickerel, MD;  Location: [facility];  Service: General;  Laterality: N/A;   UPPER GI ENDOSCOPY N/A 08/30/2022   Procedure: UPPER GI ENDOSCOPY;  Surgeon: Greer Pickerel, MD;  Location: [facility];  Service: General;  Laterality: N/A;   WRIST GANGLION EXCISION Right 01/28/2000   XI ROBOTIC ASSISTED HIATAL HERNIA REPAIR N/A 08/10/2021   Procedure: XI ROBOTIC ASSISTED HIATAL HERNIA REPAIR WITH MESH, ROBOTIC LYSIS OF ADHESIONS;  Surgeon: Greer Pickerel, MD;  Location: [facility];  Service: General;  Laterality: N/A;  Social History:   reports that she quit smoking about 7 months ago. Her smoking use included cigarettes. She has a 20.00 pack-year smoking history. She has never used smokeless tobacco. She reports that she does not currently use alcohol. She reports that she does not use drugs.  Allergies  Allergen Reactions   Gabapentin Swelling    Leg swelling Liver failure  Pt currently taking gabapentin   Sulfa Antibiotics Hives and Other (See Comments)    Other  reaction(s): hives   Sulfasalazine Hives   Lamictal [Lamotrigine] Nausea And Vomiting and Other (See Comments)    Tremors, diplopia   Penicillins Hives, Itching and Other (See Comments)    As a child, "breathing, itching problem" Has patient had a PCN reaction causing immediate rash, facial/tongue/throat swelling, SOB or lightheadedness with hypotension: Yes Has patient had a PCN reaction causing severe rash involving mucus membranes or skin necrosis: Yes Has patient had a PCN reaction that required hospitalization No Has patient had a PCN reaction occurring within the last 10 years: No If all of the above answers are "NO", then may proceed with Cephalosporin use.    Tylenol [Acetaminophen]     Due to liver failure    Morphine And Related Itching and Rash   Sulfur Itching    Family History  Problem Relation Age of Onset   Bipolar disorder Son    Breast cancer Mother    Breast cancer Sister    Anxiety disorder Brother    Depression Brother    Breast cancer Maternal Aunt    Breast cancer Maternal Grandmother      Prior to Admission medications   Medication Sig Start Date End Date Taking? Authorizing Provider  ALPRAZolam Duanne Moron) 1 MG tablet Take 1 mg by mouth 3 (three) times daily as needed for anxiety. 06/30/22  Yes [provider]  Carboxymeth-Glyc-Polysorb PF (REFRESH OPTIVE MEGA-3) 0.5-1-0.5 % SOLN Place 1 drop into both eyes 3 (three) times daily as needed (dry/irritated eyes.).   Yes [provider]  dicyclomine (BENTYL) 20 MG tablet Take 1 tablet (20 mg total) by mouth 3 (three) times daily before meals. Patient taking differently: Take 20 mg by mouth 3 (three) times daily as needed for spasms. 08/16/21  Yes Palumbo, April, MD  escitalopram (LEXAPRO) 20 MG tablet Take 20 mg by mouth in the morning. 02/10/21  Yes [provider]  esomeprazole (NEXIUM) 40 MG capsule Take 40 mg by mouth 2 (two) times daily. 02/10/21  Yes [provider]   furosemide (LASIX) 20 MG tablet Take 20 mg by mouth in the morning. 02/08/19  Yes [provider]  gabapentin (NEURONTIN) 100 MG capsule Take 1 capsule (100 mg total) by mouth 3 (three) times daily. 06/07/22  Yes Pete Pelt, PA-C  glycerin adult 2 g suppository Place 1 suppository rectally as needed for constipation. 07/13/22  Yes Godfrey Pick, MD  levothyroxine (SYNTHROID) 88 MCG tablet Take 88 mcg by mouth daily before breakfast. 12/19/20  Yes [provider]  LINZESS 290 MCG CAPS capsule Take 290 mcg by mouth daily before breakfast. 12/01/20  Yes [provider]  lithium 300 MG tablet Take 300 mg by mouth 2 (two) times daily. 02/01/19  Yes [provider]  MAGNESIUM CITRATE PO Take 1 capsule by mouth daily.   Yes [provider]  midodrine (PROAMATINE) 2.5 MG tablet Take 1 tablet (2.5 mg total) by mouth every 6 (six) hours as needed (low b/p). 03/23/22  Yes Park Liter, MD  ondansetron (ZOFRAN-ODT) 8 MG disintegrating tablet Take 1 tablet (8 mg total) by mouth every 6 (six) hours as needed for nausea. 08/31/22  Yes Greer Pickerel, MD  oxyCODONE (OXY IR/ROXICODONE) 5 MG immediate release tablet Take 1 tablet (5 mg total) by mouth every 6 (six) hours as needed for severe pain. 08/31/22  Yes Greer Pickerel, MD  psyllium (METAMUCIL) 0.52 g capsule Take 1 capsule (0.52 g total) by mouth daily. 07/13/22  Yes Godfrey Pick, MD  QUEtiapine (SEROQUEL) 100 MG tablet Take 400-600 mg by mouth at bedtime.   Yes [provider]  rifaximin (XIFAXAN) 550 MG TABS tablet Take 550 mg by mouth 2 (two) times daily.   Yes [provider]  rizatriptan (MAXALT) 10 MG tablet Take 10 mg by mouth every 2 (two) hours as needed for migraine. 11/10/20  Yes [provider]  spironolactone (ALDACTONE) 50 MG tablet Take 50 mg by mouth in the morning. 06/23/18  Yes [provider]  topiramate (TOPAMAX) 25 MG tablet Take 25 mg by mouth 2 (two) times daily.  02/20/21  Yes [provider]  ursodiol (ACTIGALL) 500 MG tablet Take 500 mg by mouth in the morning and at bedtime.   Yes [provider]  VRAYLAR 1.5 MG capsule Take 1.5 mg by mouth at bedtime. 06/30/22  Yes [provider]  lactulose (CHRONULAC) 10 GM/15ML solution Place 45 mLs (30 g total) into feeding tube every 6 (six) hours. Patient not taking: Reported on 08/24/2022 06/09/18   Shelly Coss, MD  polyethylene glycol (MIRALAX / GLYCOLAX) 17 g packet Take 17 g by mouth daily. For bowel cleanout, mix 6-8 packets in a large Gatorade or similar drink. Patient not taking: Reported on 08/24/2022 07/13/22   Godfrey Pick, MD    Physical Exam: Vitals:   09/05/22 2208 09/05/22 2215 09/05/22 2245 09/05/22 2300  BP: 102/66 101/63 116/80 128/87  Pulse: 68 70 74 61  Resp: '16 16 16 16  '$ Temp:      TempSrc:      SpO2: 97% 97% 97% 94%     Constitutional: NAD, calm  Eyes: PERTLA, lids and conjunctivae normal ENMT: Mucous membranes are moist. Posterior pharynx clear of any exudate or lesions.   Neck: supple, no masses  Respiratory: clear to auscultation bilaterally, no wheezing, no crackles. No accessory muscle use.  Cardiovascular: S1 & S2 heard, regular rate and rhythm. No extremity edema. No significant JVD. Abdomen: No distension, no tenderness, soft. Bowel sounds active.  Musculoskeletal: no clubbing / cyanosis. No joint deformity upper and lower extremities.   Skin: no significant rashes, lesions, ulcers. Warm, dry, well-perfused. Neurologic: CN 2-12 grossly intact. Sensation intact, DTR normal. Strength 5/5 in all 4 limbs. Alert and oriented.  Psychiatric: Pleasant. Cooperative.    Labs and Imaging on Admission: I have personally reviewed following labs and imaging studies  CBC: Recent Labs  Lab 08/31/22 0753 09/05/22 1750  WBC 8.9 6.2  NEUTROABS  --  3.7  HGB 13.5 12.8  HCT 42.5 40.3  MCV 103.4* 104.1*  PLT 242 009   Basic Metabolic Panel: Recent Labs   Lab 08/31/22 0424 09/05/22 1750  NA 139 143  K 5.6* 3.8  CL 112* 116*  CO2 20* 24  GLUCOSE 153* 98  BUN 13 20  CREATININE 0.79 0.62  CALCIUM 9.0 8.2*   GFR: Estimated Creatinine Clearance: 94.9 mL/min (by C-G formula based on SCr of 0.62 mg/dL). Liver Function Tests: Recent Labs  Lab 09/05/22 1750  AST 20  ALT 25  ALKPHOS 262*  BILITOT 0.7  PROT 5.9*  ALBUMIN 3.5   No results for input(s): "LIPASE", "AMYLASE" in the last 168 hours. Recent Labs  Lab 09/05/22 1915  AMMONIA 20   Coagulation Profile: Recent Labs  Lab 09/05/22 1750  INR 1.1   Cardiac Enzymes: No results for input(s): "CKTOTAL", "CKMB", "CKMBINDEX", "TROPONINI" in the last 168 hours. BNP (last 3 results) No results for input(s): "PROBNP" in the last 8760 hours. HbA1C: No results for input(s): "HGBA1C" in the last 72 hours. CBG: Recent Labs  Lab 09/05/22 1749  GLUCAP 84   Lipid Profile: No results for input(s): "CHOL", "HDL", "LDLCALC", "TRIG", "CHOLHDL", "LDLDIRECT" in the last 72 hours. Thyroid Function Tests: No results for input(s): "TSH", "T4TOTAL", "FREET4", "T3FREE", "THYROIDAB" in the last 72 hours. Anemia Panel: No results for input(s): "VITAMINB12", "FOLATE", "FERRITIN", "TIBC", "IRON", "RETICCTPCT" in the last 72 hours. Urine analysis:    Component Value Date/Time   COLORURINE YELLOW 09/05/2022 2001   APPEARANCEUR CLEAR 09/05/2022 2001   LABSPEC 1.020 09/05/2022 2001   [place] 5.0 09/05/2022 2001   GLUCOSEU NEGATIVE 09/05/2022 2001   HGBUR NEGATIVE 09/05/2022 2001   BILIRUBINUR NEGATIVE 09/05/2022 2001   [place] NEGATIVE 09/05/2022 2001   PROTEINUR NEGATIVE 09/05/2022 2001   NITRITE NEGATIVE 09/05/2022 2001   LEUKOCYTESUR SMALL (A) 09/05/2022 2001   Sepsis Labs: '@LABRCNTIP'$ (procalcitonin:4,lacticidven:4) )No results found for this or any previous visit (from the past 240 hour(s)).   Radiological Exams on Admission: CT Head Wo Contrast  Result Date:  09/05/2022 CLINICAL DATA:  Altered mental status EXAM: CT HEAD WITHOUT CONTRAST TECHNIQUE: Contiguous axial images were obtained from the base of the skull through the vertex without intravenous contrast. RADIATION DOSE REDUCTION: This exam was performed according to the departmental dose-optimization program which includes automated exposure control, adjustment of the mA and/or kV according to patient size and/or use of iterative reconstruction technique. COMPARISON:  07/13/2022 FINDINGS: Brain: No evidence of acute infarction, hemorrhage, hydrocephalus, extra-axial collection or mass lesion/mass effect. Vascular: No hyperdense vessel or unexpected calcification. Skull: Normal. Negative for fracture or focal lesion. Sinuses/Orbits: The visualized paranasal sinuses are essentially clear. The mastoid air cells are unopacified. Other: None. IMPRESSION: Normal head CT. Electronically Signed   By: Julian Hy M.D.   On: 09/05/2022 19:01   DG Abdomen Acute W/Chest  Result Date: 09/05/2022 CLINICAL DATA:  Status post gastric bypass, abdominal pain EXAM: DG ABDOMEN ACUTE WITH 1 VIEW CHEST COMPARISON:  CT chest abdomen pelvis dated 07/13/2022 FINDINGS: Lungs are clear.  No pleural effusion or pneumothorax. The heart is normal in size. Postsurgical changes in the upper abdomen related to gastric bypass. Nonobstructive bowel gas pattern. No evidence of free air under the diaphragm on the upright view. Visualized osseous structures are within normal limits. IMPRESSION: No evidence of acute cardiopulmonary disease. No evidence of small bowel obstruction or free air. Electronically Signed   By: Julian Hy M.D.   On: 09/05/2022 19:00     Assessment/Plan   1. Acute encephalopathy  - In ED, head CT normal, ammonia normal, lithium level low, EtOH undetectable, pCO2 normal  - DDx includes polypharmacy, delirium d/t pain, nutrient deficiency, CVA  - Hold sedating medications, check MRI brain, UDS, B12, B1,  copper, TSH, and RPR, continue supportive care   2. Bipolar disorder; GAD  - Hold potentially sedating medications for now    3. Hypothyroidism  - Continue Synthroid    DVT prophylaxis: Lovenox  Code Status: Full  Level of Care:  Level of care: Telemetry Family Communication: ***  Disposition Plan:  Patient is from: home  Anticipated d/c is to: TBD Anticipated d/c date is: Possibly as early as 11/5 or 09/06/22  Patient currently: Pending MRI brain and additional lab workup  Consults called: none  Admission status: Observation     Vianne Bulls, MD [facility]  09/05/2022, 11:36 PM

## 2022-09-05 NOTE — ED Triage Notes (Signed)
Pt BIBA from home. Pt had gastric bypass on Monday. Pt became confused with slurred speech today. EMS stated neg on stroke scale.   Given 400 NS and 4 mg zofran  Pt Aox4 upon arrival  BP: 114/62  HR: 74 SPO2: 97 CBG: 103

## 2022-09-05 NOTE — ED Notes (Signed)
Pt ambulatory to bathroom w/ standby assist for safety.  

## 2022-09-05 NOTE — ED Provider Notes (Signed)
[place] DEPT Provider Note   CSN: [id number] Arrival date & time: 09/05/22  1732     History  Chief Complaint  Patient presents with   Altered Mental Status   Hypotension    Jordan Russell is a 51 y.o. female.   Altered Mental Status Patient has history of multiple medical problems including fibromyalgia, irritable bowel syndrome, hypothyroidism, liver failure, rheumatoid arthritis, major depression, chronic kidney disease, hypertension, sepsis.  She has had prior orthopedic surgeries, hysterectomy, laparoscopy and recent bariatric surgery.  Patient had a remote history of Roux-en-Y gastric bypass.  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  Patient states she had gastric bypass surgery on Monday.  Patient states she has been having some persistent pain in her abdomen since her surgery.  She has been taking her pain medications including oxycodone.  Patient states she only took 1 tablet this morning.  Patient also takes Xanax but has not had any today.  Patient however has had trouble with lethargy confusion and slurred speech.  Family noticed the symptoms at least since yesterday, but getting worse.  Patient denies any vomiting.  No fevers.  No other changes in her medications.  No recent falls headache or head injury.  No focal weakness numbness    Home Medications Prior to Admission medications   Medication Sig Start Date End Date Taking? Authorizing Provider  ALPRAZolam Duanne Moron) 1 MG tablet Take 1 mg by mouth 3 (three) times daily as needed for anxiety. 06/30/22  Yes [provider]  Carboxymeth-Glyc-Polysorb PF (REFRESH OPTIVE MEGA-3) 0.5-1-0.5 % SOLN Place 1 drop into both eyes 3 (three) times daily as needed (dry/irritated eyes.).   Yes [provider]  dicyclomine (BENTYL) 20 MG  tablet Take 1 tablet (20 mg total) by mouth 3 (three) times daily before meals. Patient taking differently: Take 20 mg by mouth 3 (three) times daily as needed for spasms. 08/16/21  Yes Palumbo, April, MD  escitalopram (LEXAPRO) 20 MG tablet Take 20 mg by mouth in the morning. 02/10/21  Yes [provider]  esomeprazole (NEXIUM) 40 MG capsule Take 40 mg by mouth 2 (two) times daily. 02/10/21  Yes [provider]  furosemide (LASIX) 20 MG tablet Take 20 mg by mouth in the morning. 02/08/19  Yes [provider]  gabapentin (NEURONTIN) 100 MG capsule Take 1 capsule (100 mg total) by mouth 3 (three) times daily. 06/07/22  Yes Pete Pelt, PA-C  glycerin adult 2 g suppository Place 1 suppository rectally as needed for constipation. 07/13/22  Yes Godfrey Pick, MD  levothyroxine (SYNTHROID) 88 MCG tablet Take 88 mcg by mouth daily before breakfast. 12/19/20  Yes [provider]  LINZESS 290 MCG CAPS capsule Take 290 mcg by mouth daily before breakfast. 12/01/20  Yes [provider]  lithium 300 MG tablet Take 300 mg by mouth 2 (two) times daily. 02/01/19  Yes [provider]  MAGNESIUM CITRATE PO Take 1 capsule by mouth daily.   Yes [provider]  midodrine (PROAMATINE) 2.5 MG tablet Take 1 tablet (2.5 mg total) by mouth every 6 (six) hours as needed (low b/p). 03/23/22  Yes Park Liter, MD  ondansetron (ZOFRAN-ODT) 8 MG disintegrating tablet Take 1 tablet (8 mg total) by mouth every 6 (six) hours as needed for nausea. 08/31/22  Yes Greer Pickerel, MD  oxyCODONE (OXY IR/ROXICODONE) 5 MG immediate release tablet Take 1 tablet (5 mg total) by mouth every 6 (six) hours as needed for severe pain. 08/31/22  Yes Greer Pickerel, MD  psyllium (METAMUCIL) 0.52 g capsule Take 1 capsule (0.52 g total) by mouth daily. 07/13/22  Yes Godfrey Pick, MD  QUEtiapine (SEROQUEL) 100 MG tablet Take 400-600 mg by mouth at bedtime.   Yes [provider]   rifaximin (XIFAXAN) 550 MG TABS tablet Take 550 mg by mouth 2 (two) times daily.   Yes [provider]  rizatriptan (MAXALT) 10 MG tablet Take 10 mg by mouth every 2 (two) hours as needed for migraine. 11/10/20  Yes [provider]  spironolactone (ALDACTONE) 50 MG tablet Take 50 mg by mouth in the morning. 06/23/18  Yes [provider]  topiramate (TOPAMAX) 25 MG tablet Take 25 mg by mouth 2 (two) times daily. 02/20/21  Yes [provider]  ursodiol (ACTIGALL) 500 MG tablet Take 500 mg by mouth in the morning and at bedtime.   Yes [provider]  VRAYLAR 1.5 MG capsule Take 1.5 mg by mouth at bedtime. 06/30/22  Yes [provider]  lactulose (CHRONULAC) 10 GM/15ML solution Place 45 mLs (30 g total) into feeding tube every 6 (six) hours. Patient not taking: Reported on 08/24/2022 06/09/18   Shelly Coss, MD  polyethylene glycol (MIRALAX / GLYCOLAX) 17 g packet Take 17 g by mouth daily. For bowel cleanout, mix 6-8 packets in a large Gatorade or similar drink. Patient not taking: Reported on 08/24/2022 07/13/22   Godfrey Pick, MD      Allergies    Gabapentin, Sulfa antibiotics, Sulfasalazine, Lamictal [lamotrigine], Penicillins, Tylenol [acetaminophen], Morphine and related, and Sulfur    Review of Systems   Review of Systems  Physical Exam Updated Vital Signs BP 124/81   Pulse 65   Temp 98.1 F (36.7 C)   Resp 16   SpO2 91%  Physical Exam Vitals and nursing note reviewed.  Constitutional:      Appearance: She is well-developed. She is not diaphoretic.     Comments: Alert, awake, speech is slow, appears sedated; increased bmi  HENT:     Head: Normocephalic and atraumatic.     Comments: Mucous membranes are dry    Right Ear: External ear normal.     Left Ear: External ear normal.  Eyes:     General: No visual field deficit or scleral icterus.       Right eye: No discharge.        Left eye: No discharge.     Conjunctiva/sclera:  Conjunctivae normal.  Neck:     Trachea: No tracheal deviation.  Cardiovascular:     Rate and Rhythm: Normal rate and regular rhythm.  Pulmonary:     Effort: Pulmonary effort is normal. No respiratory distress.     Breath sounds: Normal breath sounds. No stridor. No wheezing or rales.  Abdominal:     General: Bowel sounds are normal. There is no distension.     Palpations: Abdomen is soft.     Tenderness: There is no abdominal tenderness. There is no guarding or rebound.  Musculoskeletal:        General: No tenderness.     Cervical back: Neck supple.  Skin:    General: Skin is warm and dry.     Findings: No rash.  Neurological:     Mental Status: She is oriented to person, place, and time.     GCS:  GCS eye subscore is 4. GCS verbal subscore is 5. GCS motor subscore is 6.     Cranial Nerves: Dysarthria present. No cranial nerve deficit or facial asymmetry.     Sensory: No sensory deficit.     Motor: No abnormal muscle tone, seizure activity or pronator drift.     Coordination: Coordination normal.     Comments:  able to hold both legs off bed for 5 seconds, sensation intact in all extremities,  no left or right sided neglect, , no nystagmus noted  Speech is slow slightly slurred   Psychiatric:        Mood and Affect: Mood normal.     ED Results / Procedures / Treatments   Labs (all labs ordered are listed, but only abnormal results are displayed) Labs Reviewed  COMPREHENSIVE METABOLIC PANEL - Abnormal; Notable for the following components:      Result Value   Chloride 116 (*)    Calcium 8.2 (*)    Total Protein 5.9 (*)    Alkaline Phosphatase 262 (*)    Anion gap 3 (*)    All other components within normal limits  CBC WITH DIFFERENTIAL/PLATELET - Abnormal; Notable for the following components:   MCV 104.1 (*)    All other components within normal limits  URINALYSIS, ROUTINE W REFLEX MICROSCOPIC - Abnormal; Notable for the following components:   Leukocytes,Ua SMALL  (*)    All other components within normal limits  BLOOD GAS, VENOUS - Abnormal; Notable for the following components:   Acid-base deficit 2.9 (*)    All other components within normal limits  RAPID URINE DRUG SCREEN, HOSP PERFORMED - Abnormal; Notable for the following components:   Benzodiazepines POSITIVE (*)    Tetrahydrocannabinol POSITIVE (*)    All other components within normal limits  LITHIUM LEVEL - Abnormal; Notable for the following components:   Lithium Lvl 0.17 (*)    All other components within normal limits  PROTIME-INR  AMMONIA  ETHANOL  HIV ANTIBODY (ROUTINE TESTING W REFLEX)  CBC  COMPREHENSIVE METABOLIC PANEL  VITAMIN U93  VITAMIN B1  TSH  RPR  COPPER, SERUM  CBG MONITORING, ED    EKG None  Radiology CT Head Wo Contrast  Result Date: 09/05/2022 CLINICAL DATA:  Altered mental status EXAM: CT HEAD WITHOUT CONTRAST TECHNIQUE: Contiguous axial images were obtained from the base of the skull through the vertex without intravenous contrast. RADIATION DOSE REDUCTION: This exam was performed according to the departmental dose-optimization program which includes automated exposure control, adjustment of the mA and/or kV according to patient size and/or use of iterative reconstruction technique. COMPARISON:  07/13/2022 FINDINGS: Brain: No evidence of acute infarction, hemorrhage, hydrocephalus, extra-axial collection or mass lesion/mass effect. Vascular: No hyperdense vessel or unexpected calcification. Skull: Normal. Negative for fracture or focal lesion. Sinuses/Orbits: The visualized paranasal sinuses are essentially clear. The mastoid air cells are unopacified. Other: None. IMPRESSION: Normal head CT. Electronically Signed   By: Julian Hy M.D.   On: 09/05/2022 19:01   DG Abdomen Acute W/Chest  Result Date: 09/05/2022 CLINICAL DATA:  Status post gastric bypass, abdominal pain EXAM: DG ABDOMEN ACUTE WITH 1 VIEW CHEST COMPARISON:  CT chest abdomen pelvis dated  07/13/2022 FINDINGS: Lungs are clear.  No pleural effusion or pneumothorax. The heart is normal in size. Postsurgical changes in the upper abdomen related to gastric bypass. Nonobstructive bowel gas pattern. No evidence of free air under the diaphragm on the upright view. Visualized osseous structures are within normal  limits. IMPRESSION: No evidence of acute cardiopulmonary disease. No evidence of small bowel obstruction or free air. Electronically Signed   By: Julian Hy M.D.   On: 09/05/2022 19:00    Procedures Procedures    Medications Ordered in ED Medications  levothyroxine (SYNTHROID) tablet 88 mcg (has no administration in time range)  dicyclomine (BENTYL) tablet 20 mg (has no administration in time range)  pantoprazole (PROTONIX) EC tablet 40 mg (has no administration in time range)  linaclotide (LINZESS) capsule 290 mcg (has no administration in time range)  enoxaparin (LOVENOX) injection 40 mg (has no administration in time range)  acetaminophen (TYLENOL) tablet 650 mg (has no administration in time range)    Or  acetaminophen (TYLENOL) suppository 650 mg (has no administration in time range)  polyethylene glycol (MIRALAX / GLYCOLAX) packet 17 g (has no administration in time range)  sodium chloride 0.9 % bolus 1,000 mL (0 mLs Intravenous Stopped 09/05/22 2004)  oxyCODONE (Oxy IR/ROXICODONE) immediate release tablet 5 mg (5 mg Oral Given 09/05/22 2334)    ED Course/ Medical Decision Making/ A&P Clinical Course as of 09/05/22 2345  Sun Sep 05, 2022  2021 Patient complaining of persistent speech disturbance.  Head CT without acute findings.  CBC and metabolic panel unremarkable.  Ammonia level and lithium level pending. [JK]  2223 Ammonia level not elevated.  Lithium level not elevated [JK]  4818 CBC and metabolic panel unremarkable. [JK]  2223 Head CT without acute findings [JK]  2300 MRI was ordered at 2030  Was notified that MRI staff has left for the evening. [JK]  2345  Case discussed with Dr. Myna Hidalgo [JK]    Clinical Course User Index [JK] Dorie Rank, MD                           Medical Decision Making Differential diagnosis includes but not limited to, stroke, polypharmacy, metabolic encephalopathy, hyperammonemia  Outside code stroke tPA window  Problems Addressed: Altered mental status, unspecified altered mental status type: acute illness or injury that poses a threat to life or bodily functions Dysarthria: acute illness or injury that poses a threat to life or bodily functions  Amount and/or Complexity of Data Reviewed Labs: ordered. Decision-making details documented in ED Course. Radiology: ordered and independent interpretation performed.  Risk Prescription drug management. Decision regarding hospitalization.  Patient presented to the ED for evaluation of lethargy, altered mental status, difficulty with her speech.  Patient recently hospital for laparoscopic resection of candycane limb.  Patient has been taking her pain medications also on benzodiazepines but denies any excessive use.  Patient has history of multiple medical problems.  No signs of acute electrolyte abnormality.  No hyperammonemia.  No lithium toxicity.  Head CT without acute findings but stroke is still a concern.  Patient is outside any treatment window but I do feel she needs MRI and further work-up.  I will consult the medical service for admission.        Final Clinical Impression(s) / ED Diagnoses Final diagnoses:  Altered mental status, unspecified altered mental status type  Dysarthria    Rx / DC Orders ED Discharge Orders     None         Dorie Rank, MD 09/05/22 2345

## 2022-09-06 ENCOUNTER — Encounter (HOSPITAL_COMMUNITY): Payer: Self-pay | Admitting: Family Medicine

## 2022-09-06 ENCOUNTER — Observation Stay (HOSPITAL_COMMUNITY): Payer: BC Managed Care – PPO

## 2022-09-06 DIAGNOSIS — R4182 Altered mental status, unspecified: Secondary | ICD-10-CM | POA: Diagnosis not present

## 2022-09-06 DIAGNOSIS — G9340 Encephalopathy, unspecified: Secondary | ICD-10-CM | POA: Diagnosis not present

## 2022-09-06 LAB — COMPREHENSIVE METABOLIC PANEL
ALT: 24 U/L (ref 0–44)
AST: 19 U/L (ref 15–41)
Albumin: 3.4 g/dL — ABNORMAL LOW (ref 3.5–5.0)
Alkaline Phosphatase: 271 U/L — ABNORMAL HIGH (ref 38–126)
Anion gap: 6 (ref 5–15)
BUN: 14 mg/dL (ref 6–20)
CO2: 23 mmol/L (ref 22–32)
Calcium: 8.7 mg/dL — ABNORMAL LOW (ref 8.9–10.3)
Chloride: 113 mmol/L — ABNORMAL HIGH (ref 98–111)
Creatinine, Ser: 0.63 mg/dL (ref 0.44–1.00)
GFR, Estimated: >60 mL/min (ref >60)
Glucose, Bld: 95 mg/dL (ref 70–99)
Potassium: 3.9 mmol/L (ref 3.5–5.1)
Sodium: 142 mmol/L (ref 135–145)
Total Bilirubin: 0.6 mg/dL (ref 0.3–1.2)
Total Protein: 6.1 g/dL — ABNORMAL LOW (ref 6.5–8.1)

## 2022-09-06 LAB — CBC
HCT: 43.9 % (ref 36.0–46.0)
Hemoglobin: 13.9 g/dL (ref 12.0–15.0)
MCH: 32.6 pg (ref 26.0–34.0)
MCHC: 31.7 g/dL (ref 30.0–36.0)
MCV: 103.1 fL — ABNORMAL HIGH (ref 80.0–100.0)
Platelets: 213 10*3/uL (ref 150–400)
RBC: 4.26 MIL/uL (ref 3.87–5.11)
RDW: 12.7 % (ref 11.5–15.5)
WBC: 6.5 10*3/uL (ref 4.0–10.5)
nRBC: 0 % (ref 0.0–0.2)

## 2022-09-06 LAB — RPR: RPR Ser Ql: NONREACTIVE

## 2022-09-06 LAB — HIV ANTIBODY (ROUTINE TESTING W REFLEX): HIV Screen 4th Generation wRfx: NONREACTIVE

## 2022-09-06 LAB — VITAMIN B12: Vitamin B-12: 926 pg/mL — ABNORMAL HIGH (ref 180–914)

## 2022-09-06 LAB — TSH: TSH: 0.27 u[IU]/mL — ABNORMAL LOW (ref 0.350–4.500)

## 2022-09-06 MED ORDER — RIFAXIMIN 550 MG PO TABS
550.0000 mg | ORAL_TABLET | Freq: Two times a day (BID) | ORAL | Status: DC
  Administered 2022-09-06: 550 mg via ORAL
  Filled 2022-09-06: qty 1

## 2022-09-06 MED ORDER — SUMATRIPTAN SUCCINATE 50 MG PO TABS: 50.0000 mg | ORAL_TABLET | ORAL | Status: DC | PRN

## 2022-09-06 MED ORDER — OXYCODONE HCL 5 MG PO TABS: 2.5000 mg | ORAL_TABLET | ORAL | 0 refills | Status: DC | PRN

## 2022-09-06 MED ORDER — LITHIUM CARBONATE 300 MG PO CAPS
300.0000 mg | ORAL_CAPSULE | Freq: Two times a day (BID) | ORAL | Status: DC
  Administered 2022-09-06: 300 mg via ORAL
  Filled 2022-09-06: qty 1

## 2022-09-06 MED ORDER — ALPRAZOLAM 0.5 MG PO TABS: 1.0000 mg | ORAL_TABLET | Freq: Three times a day (TID) | ORAL | Status: DC | PRN

## 2022-09-06 MED ORDER — OXYCODONE HCL 5 MG PO TABS: 5.0000 mg | ORAL_TABLET | Freq: Four times a day (QID) | ORAL | Status: DC | PRN

## 2022-09-06 NOTE — Plan of Care (Signed)

## 2022-09-06 NOTE — Progress Notes (Signed)
Mobility Specialist - Progress Note   09/06/22 1252  Mobility  Activity Ambulated independently in hallway  Level of Assistance Independent  Assistive Device None  Distance Ambulated (ft) 500 ft  Activity Response Tolerated well  Mobility Referral Yes  $Mobility charge 1 Mobility   Pt received in bed and agreed to mobility, no c/o pain nor discomfort. Pt returned to bed with all needs met and family in room.   Roderick Pee Mobility Specialist

## 2022-09-09 LAB — VITAMIN B1: Vitamin B1 (Thiamine): 103.3 nmol/L (ref 66.5–200.0)

## 2022-09-09 NOTE — Discharge Summary (Signed)
Physician Discharge Summary   Patient: Jordan Russell MRN: [id number] DOB: 10/03/1971  Admit date:     09/05/2022  Discharge date: 09/06/2022  Discharge Physician: Berle Mull  PCP: Charlane Ferretti, MD  Recommendations at discharge:  Follow up with PCP in 1 week   Follow-up Information     Charlane Ferretti, MD. Schedule an appointment as soon as possible for a visit in 1 week(s).   Specialty: Internal Medicine Contact information: [address] [place] [address] [phone]                Discharge Diagnoses: Principal Problem:   Acute encephalopathy Active Problems:   Bipolar disorder, unspecified   Generalized anxiety disorder   Hypothyroidism   S/P laparoscopy  Assessment and Plan  Acute encephalopathy metabolic? head CT MRI brain normal, ammonia normal, lithium level low, EtOH undetectable, pCO2 normal  - DDx includes polypharmacy, delirium d/t pain, nutrient deficiency, Metabolic work up normal as well.  Pt was educated for the risk fo polypharmacy with various meds she is on.    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  Placing the pt at higher risk of poor outcomes.    Liver cirrhosis Continue home meds  Confirmed with surgery that the pt can take the rifampin  Consultants:  none  Procedures performed:  none  DISCHARGE MEDICATION: Allergies as of 09/06/2022       Reactions   Gabapentin Swelling   Leg swelling Liver failure Pt currently taking gabapentin   Sulfa Antibiotics Hives, Other (See Comments)   Other reaction(s): hives   Sulfasalazine Hives   Lamictal [lamotrigine] Nausea And Vomiting, Other (See Comments)   Tremors, diplopia   Penicillins Hives, Itching, Other (See Comments)   As a child,  "breathing, itching problem" Has patient had a PCN reaction causing immediate rash, facial/tongue/throat swelling, SOB or lightheadedness with hypotension: Yes Has patient had a PCN reaction causing severe rash involving mucus membranes or skin necrosis: Yes Has patient had a PCN reaction that required hospitalization No Has patient had a PCN reaction occurring within the last 10 years: No If all of the above answers are "NO", then may proceed with Cephalosporin use.   Tylenol [acetaminophen]    Due to liver failure   Morphine And Related Itching, Rash   Sulfur Itching        Medication List     TAKE these medications    ALPRAZolam 1 MG tablet Commonly known as: XANAX Take 1 mg by mouth 3 (three) times daily as needed for anxiety.   dicyclomine 20 MG tablet Commonly known as: BENTYL Take 1 tablet (20 mg total) by mouth 3 (three) times daily before meals. What changed:  when to take this reasons to take this   escitalopram 20 MG tablet Commonly known as: LEXAPRO Take 20 mg by mouth in the morning.   esomeprazole 40 MG capsule Commonly known as: NEXIUM Take 40 mg by mouth 2 (two) times daily.   furosemide 20 MG tablet Commonly known as: LASIX Take 20 mg by mouth in the morning.   gabapentin 100 MG capsule Commonly known as: NEURONTIN TAKE 1 CAPSULE (100 MG TOTAL) BY MOUTH THREE TIMES DAILY. What changed: See the new instructions.  glycerin adult 2 g suppository Place 1 suppository rectally as needed for constipation.   lactulose 10 GM/15ML solution Commonly known as: CHRONULAC Place 45 mLs (30 g total) into feeding tube every 6 (six) hours.   levothyroxine 88 MCG tablet Commonly known as: SYNTHROID Take 88 mcg by mouth daily before breakfast.   Linzess 290 MCG Caps capsule Generic drug: linaclotide Take 290 mcg by mouth daily before breakfast.   lithium 300 MG tablet Take 300 mg by mouth 2 (two) times daily.   MAGNESIUM CITRATE PO Take 1 capsule by  mouth daily.   Metamucil 0.52 g capsule Generic drug: psyllium Take 1 capsule (0.52 g total) by mouth daily.   midodrine 2.5 MG tablet Commonly known as: PROAMATINE Take 1 tablet (2.5 mg total) by mouth every 6 (six) hours as needed (low b/p).   ondansetron 8 MG disintegrating tablet Commonly known as: ZOFRAN-ODT Take 1 tablet (8 mg total) by mouth every 6 (six) hours as needed for nausea.   oxyCODONE 5 MG immediate release tablet Commonly known as: Oxy IR/ROXICODONE Take 0.5 tablets (2.5 mg total) by mouth every 4 (four) hours as needed for severe pain or moderate pain. What changed:  how much to take when to take this reasons to take this   polyethylene glycol 17 g packet Commonly known as: MIRALAX / GLYCOLAX Take 17 g by mouth daily. For bowel cleanout, mix 6-8 packets in a large Gatorade or similar drink.   QUEtiapine 100 MG tablet Commonly known as: SEROQUEL Take 400-600 mg by mouth at bedtime.   Refresh Optive Mega-3 0.5-1-0.5 % Soln Generic drug: Carboxymeth-Glyc-Polysorb PF Place 1 drop into both eyes 3 (three) times daily as needed (dry/irritated eyes.).   rifaximin 550 MG Tabs tablet Commonly known as: XIFAXAN Take 550 mg by mouth 2 (two) times daily.   rizatriptan 10 MG tablet Commonly known as: MAXALT Take 10 mg by mouth every 2 (two) hours as needed for migraine.   spironolactone 50 MG tablet Commonly known as: ALDACTONE Take 50 mg by mouth in the morning.   topiramate 25 MG tablet Commonly known as: TOPAMAX Take 25 mg by mouth 2 (two) times daily.   ursodiol 500 MG tablet Commonly known as: ACTIGALL Take 500 mg by mouth in the morning and at bedtime.   Vraylar 1.5 MG capsule Generic drug: cariprazine Take 1.5 mg by mouth at bedtime.       Disposition: Home Diet recommendation: Cardiac diet  Discharge Exam: Vitals:   09/06/22 0028 09/06/22 0120 09/06/22 0454 09/06/22 1214  BP: 110/68 110/68 97/63 104/62  Pulse: 64 64 69 (!) 58  Resp:  '18 18 20 18  '$ Temp: 97.9 F (36.6 C) 97.9 F (36.6 C) 97.9 F (36.6 C) 98.5 F (36.9 C)  TempSrc: Oral Oral Oral Oral  SpO2: 99% 99% 96% 98%  Weight:  92 kg    Height:  '5\' 6"'$  (1.676 m)     General: Appear in no distress; no visible Abnormal Neck Mass Or lumps, Conjunctiva normal Cardiovascular: S1 and S2 Present, no Murmur, Respiratory: good respiratory effort, Bilateral Air entry present and CTA, no Crackles, no wheezes Abdomen: Bowel Sound present, Non tender  Extremities: no Pedal edema Neurology: alert and oriented to time, place, and person no focal deficit Filed Weights   09/06/22 0120  Weight: 92 kg   Condition at discharge: stable  The results of significant diagnostics from this hospitalization (including imaging, microbiology, ancillary and laboratory) are listed below for reference.  Imaging Studies: MR BRAIN WO CONTRAST  Result Date: 09/06/2022 CLINICAL DATA:  Neuro deficit, acute, stroke suspected. Altered mental status. EXAM: MRI HEAD WITHOUT CONTRAST TECHNIQUE: Multiplanar, multiecho pulse sequences of the brain and surrounding structures were obtained without intravenous contrast. COMPARISON:  Head CT 09/05/2022.  MRI 09/14/2021. FINDINGS: Brain: Diffusion imaging does not show any acute or subacute infarction or other cause of restricted diffusion. There is mild generalized volume loss. No evidence of old or acute small or large vessel infarction, mass lesion, hemorrhage, hydrocephalus or extra-axial collection. Vascular: Major vessels at the base of the brain show flow. Skull and upper cervical spine: Negative Sinuses/Orbits: Clear/normal Other: None IMPRESSION: No acute or reversible finding. No cause of the presenting symptoms is identified. Mild generalized volume loss. Electronically Signed   By: Nelson Chimes M.D.   On: 09/06/2022 09:19   CT Head Wo Contrast  Result Date: 09/05/2022 CLINICAL DATA:  Altered mental status EXAM: CT HEAD WITHOUT CONTRAST TECHNIQUE:  Contiguous axial images were obtained from the base of the skull through the vertex without intravenous contrast. RADIATION DOSE REDUCTION: This exam was performed according to the departmental dose-optimization program which includes automated exposure control, adjustment of the mA and/or kV according to patient size and/or use of iterative reconstruction technique. COMPARISON:  07/13/2022 FINDINGS: Brain: No evidence of acute infarction, hemorrhage, hydrocephalus, extra-axial collection or mass lesion/mass effect. Vascular: No hyperdense vessel or unexpected calcification. Skull: Normal. Negative for fracture or focal lesion. Sinuses/Orbits: The visualized paranasal sinuses are essentially clear. The mastoid air cells are unopacified. Other: None. IMPRESSION: Normal head CT. Electronically Signed   By: Julian Hy M.D.   On: 09/05/2022 19:01   DG Abdomen Acute W/Chest  Result Date: 09/05/2022 CLINICAL DATA:  Status post gastric bypass, abdominal pain EXAM: DG ABDOMEN ACUTE WITH 1 VIEW CHEST COMPARISON:  CT chest abdomen pelvis dated 07/13/2022 FINDINGS: Lungs are clear.  No pleural effusion or pneumothorax. The heart is normal in size. Postsurgical changes in the upper abdomen related to gastric bypass. Nonobstructive bowel gas pattern. No evidence of free air under the diaphragm on the upright view. Visualized osseous structures are within normal limits. IMPRESSION: No evidence of acute cardiopulmonary disease. No evidence of small bowel obstruction or free air. Electronically Signed   By: Julian Hy M.D.   On: 09/05/2022 19:00    Microbiology: Results for orders placed or performed during the hospital encounter of 08/16/21  Resp Panel by RT-PCR (Flu A&B, Covid) Nasopharyngeal Swab     Status: None   Collection Time: 08/16/21  3:23 AM   Specimen: Nasopharyngeal Swab; Nasopharyngeal(NP) swabs in vial transport medium  Result Value Ref Range Status   SARS Coronavirus 2 by RT PCR NEGATIVE  NEGATIVE Final    Comment: (NOTE) SARS-CoV-2 target nucleic acids are NOT DETECTED.  The SARS-CoV-2 RNA is generally detectable in upper respiratory specimens during the acute phase of infection. The lowest concentration of SARS-CoV-2 viral copies this assay can detect is 138 copies/mL. A negative result does not preclude SARS-Cov-2 infection and should not be used as the sole basis for treatment or other patient management decisions. A negative result may occur with  improper specimen collection/handling, submission of specimen other than nasopharyngeal swab, presence of viral mutation(s) within the areas targeted by this assay, and inadequate number of viral copies(<138 copies/mL). A negative result must be combined with clinical observations, patient history, and epidemiological information. The expected result is Negative.  Fact Sheet for Patients:  EntrepreneurPulse.com.au  Fact Sheet  for Healthcare Providers:  IncredibleEmployment.be  This test is no t yet approved or cleared by the [place] and  has been authorized for detection and/or diagnosis of SARS-CoV-2 by FDA under an Emergency Use Authorization (EUA). This EUA will remain  in effect (meaning this test can be used) for the duration of the COVID-19 declaration under Section 564(b)(1) of the Act, 21 U.S.C.section 360bbb-3(b)(1), unless the authorization is terminated  or revoked sooner.       Influenza A by PCR NEGATIVE NEGATIVE Final   Influenza B by PCR NEGATIVE NEGATIVE Final    Comment: (NOTE) The Xpert Xpress SARS-CoV-2/FLU/RSV plus assay is intended as an aid in the diagnosis of influenza from Nasopharyngeal swab specimens and should not be used as a sole basis for treatment. Nasal washings and aspirates are unacceptable for Xpert Xpress SARS-CoV-2/FLU/RSV testing.  Fact Sheet for Patients: EntrepreneurPulse.com.au  Fact Sheet for Healthcare  Providers: IncredibleEmployment.be  This test is not yet approved or cleared by the [place] FDA and has been authorized for detection and/or diagnosis of SARS-CoV-2 by FDA under an Emergency Use Authorization (EUA). This EUA will remain in effect (meaning this test can be used) for the duration of the COVID-19 declaration under Section 564(b)(1) of the Act, 21 U.S.C. section 360bbb-3(b)(1), unless the authorization is terminated or revoked.  Performed at [facility], [address] [place], [place] [address]    Labs: CBC: Recent Labs  Lab 09/05/22 1750 09/06/22 0300  WBC 6.2 6.5  NEUTROABS 3.7  --   HGB 12.8 13.9  HCT 40.3 43.9  MCV 104.1* 103.1*  PLT 247 517   Basic Metabolic Panel: Recent Labs  Lab 09/05/22 1750 09/06/22 0602  NA 143 142  K 3.8 3.9  CL 116* 113*  CO2 24 23  GLUCOSE 98 95  BUN 20 14  CREATININE 0.62 0.63  CALCIUM 8.2* 8.7*   Liver Function Tests: Recent Labs  Lab 09/05/22 1750 09/06/22 0602  AST 20 19  ALT 25 24  ALKPHOS 262* 271*  BILITOT 0.7 0.6  PROT 5.9* 6.1*  ALBUMIN 3.5 3.4*   CBG: Recent Labs  Lab 09/05/22 1749  GLUCAP 84    Discharge time spent: greater than 30 minutes.  Signed: Berle Mull, MD [facility] 09/06/2022

## 2022-09-13 DIAGNOSIS — L304 Erythema intertrigo: Secondary | ICD-10-CM | POA: Diagnosis not present

## 2022-09-13 DIAGNOSIS — Z8669 Personal history of other diseases of the nervous system and sense organs: Secondary | ICD-10-CM | POA: Diagnosis not present

## 2022-09-13 DIAGNOSIS — K5909 Other constipation: Secondary | ICD-10-CM | POA: Diagnosis not present

## 2022-09-13 DIAGNOSIS — Z9889 Other specified postprocedural states: Secondary | ICD-10-CM | POA: Diagnosis not present

## 2022-09-15 LAB — COPPER, SERUM: Copper: 144 ug/dL (ref 80–158)

## 2022-10-02 ENCOUNTER — Other Ambulatory Visit: Payer: Self-pay | Admitting: Physician Assistant

## 2022-11-06 ENCOUNTER — Other Ambulatory Visit: Payer: Self-pay | Admitting: Physician Assistant

## 2022-11-30 ENCOUNTER — Ambulatory Visit: Payer: Medicare HMO | Admitting: Neurology

## 2022-12-03 ENCOUNTER — Ambulatory Visit: Payer: Medicare HMO | Admitting: Neurology

## 2022-12-03 ENCOUNTER — Encounter: Payer: Self-pay | Admitting: Neurology

## 2022-12-03 VITALS — BP 105/74 | HR 95 | Ht 66.0 in | Wt 228.0 lb

## 2022-12-03 DIAGNOSIS — G5731 Lesion of lateral popliteal nerve, right lower limb: Secondary | ICD-10-CM | POA: Diagnosis not present

## 2022-12-03 MED ORDER — DULOXETINE HCL 30 MG PO CPEP: 30.0000 mg | ORAL_CAPSULE | Freq: Every day | ORAL | 3 refills | Status: DC

## 2022-12-03 NOTE — Progress Notes (Signed)
Follow-up Visit   Date: 12/03/22   Jordan Russell MRN: [id number] DOB: 08-26-1971   Interim History: Jordan Russell is a 52 y.o. right-handed Caucasian female with hypothyroidism, GERD, hypertension, bipolar depression, hyperparathyroidism, fibromyalgia, rheumatoid arthritis, irritable bowel syndrome, history of gastric bypass with malnutrition and copper deficiency returning to the clinic for follow-up of right peroneal neuropathy, falls and memory loss.  The patient was accompanied to the clinic by self.     History of present illness: In August 2019, she was hospitalized for sepsis, hepatic encephalopathy, and drug-induced liver injury (plaquenil/eternacept) from 8/4 - 06/09/2018 at [facility] and transferred to [facility] where she stayed 8/9 - 06/23/2018.  Hospitalization was notable for healthcare associated pneumonia, E.coli sepsis, delirium, and liver injury.  She was transferred to rehab facility and was noted to have severe generalized weakness, diagnosed with critical illness myopathy.  She completed PT and slowly improved where she was able to stand and able to walk with a walker.  During this time, she noticed that her right foot was weak, specifically, difficulty raising her right foot.  She does not have similar weakness on the left foot.  She has numbness of the right great toe and middle toe.  She has numbness over the right lateral thigh for many years.  She has chronic low back pain, which is localized. She does not have radicular pain of the legs. She admits to crossing her legs.  She had NCS/EMG of the right leg at [facility] which showed axonal neuropathy and overlapping right peroneal neuropathy.    She has been using a walker since Spring 2019 because of generalized weakness and falls.  She underwent gastric bypass surgery in 2001 and lost 150lb.  She admits to having very poor appetite and continues to eat only one meal/daily.  She is active and spends 60-min  on her stationary bike daily.  She has fear of gaining weight.    She has previously been evaluated evaluated at [facility] neurology by Dr. Tommas Olp for leg weakness and cognitive complaints in 2019.  MRI brain dated 03/09/2018 showed mild generalized volume loss, no focal findings and was recommended to undergo neuropsychological testing.   UPDATE 07/30/2021:  She was last seen in 2020 for right peroneal mononeuropathy and multiple nutritional deficiencies.  She was lost to follow-up and presents today with weakness and tingling of the hands and legs, muscle cramps, and memory loss.   Starting March 2022, she began having weakness and tingling in both legs and hands.  She also feels burning and itching deep inside her legs.  She also complains of painful muscle spasms and cramps of the hands and feet. Previously labs indicated multiple nutrient deficiency including vitamin B12, vitamin B1, and copper.   She has not been taking supplements for quite some time. She still has poor nutrition and does not eat regular meals.   She is also having problems with her memory.  She walks into rooms and is confused why she is there.  She is unable to read books anymore, because she is unable to recall the plot.  She gets confused and constant repeating herself.  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  She lives at home with her husband and son.  UPDATE 12/03/2022:  She was last seen here in 07/2021.  Today, she presents with worsening right leg weakness and numbness/tingling.  She is falling 1-2 times per month because her foot feels unsteady  and tends to drag.  Most of the falls have occurred when she is not using a cane.  She has history or right peroneal neuropathy and has completed PT several times over the years with no improvement.  She denies numbness/tingling in the left foot or hands.  She continues to have problems with memory, often repeating herself.  Prior neuropsychological testing from  10/2021 shows cognitive impairment due to medical and psychiatric comorbidities, no evidence of dementia.  Labs including vitamin B12, vitamin B1, and copper are much improved and normal.   Medications:  Current Outpatient Medications on File Prior to Visit  Medication Sig Dispense Refill   ALPRAZolam (XANAX) 1 MG tablet Take 1 mg by mouth 3 (three) times daily as needed for anxiety.     Carboxymeth-Glyc-Polysorb PF (REFRESH OPTIVE MEGA-3) 0.5-1-0.5 % SOLN Place 1 drop into both eyes 3 (three) times daily as needed (dry/irritated eyes.).     dicyclomine (BENTYL) 20 MG tablet Take 1 tablet (20 mg total) by mouth 3 (three) times daily before meals. (Patient taking differently: Take 20 mg by mouth 3 (three) times daily as needed for spasms.) 21 tablet 0   escitalopram (LEXAPRO) 20 MG tablet Take 20 mg by mouth in the morning.     esomeprazole (NEXIUM) 40 MG capsule Take 40 mg by mouth 2 (two) times daily.     furosemide (LASIX) 20 MG tablet Take 20 mg by mouth in the morning.     gabapentin (NEURONTIN) 100 MG capsule TAKE 1 CAPSULE BY MOUTH THREE TIMES A DAY 90 capsule 0   glycerin adult 2 g suppository Place 1 suppository rectally as needed for constipation. 12 suppository 0   levothyroxine (SYNTHROID) 88 MCG tablet Take 88 mcg by mouth daily before breakfast.     LINZESS 290 MCG CAPS capsule Take 290 mcg by mouth daily before breakfast.     lithium 300 MG tablet Take 300 mg by mouth 2 (two) times daily.     MAGNESIUM CITRATE PO Take 1 capsule by mouth daily.     midodrine (PROAMATINE) 2.5 MG tablet Take 1 tablet (2.5 mg total) by mouth every 6 (six) hours as needed (low b/p). 180 tablet 2   ondansetron (ZOFRAN-ODT) 8 MG disintegrating tablet Take 1 tablet (8 mg total) by mouth every 6 (six) hours as needed for nausea. 20 tablet 0   polyethylene glycol (MIRALAX / GLYCOLAX) 17 g packet Take 17 g by mouth daily. For bowel cleanout, mix 6-8 packets in a large Gatorade or similar drink. 30 each 0    psyllium (METAMUCIL) 0.52 g capsule Take 1 capsule (0.52 g total) by mouth daily. 30 capsule 2   QUEtiapine (SEROQUEL) 100 MG tablet Take 400-600 mg by mouth at bedtime.     rifaximin (XIFAXAN) 550 MG TABS tablet Take 550 mg by mouth 2 (two) times daily.     rizatriptan (MAXALT) 10 MG tablet Take 10 mg by mouth every 2 (two) hours as needed for migraine.     spironolactone (ALDACTONE) 50 MG tablet Take 50 mg by mouth in the morning.     topiramate (TOPAMAX) 25 MG tablet Take 25 mg by mouth 2 (two) times daily.     ursodiol (ACTIGALL) 500 MG tablet Take 500 mg by mouth in the morning and at bedtime.     VRAYLAR 1.5 MG capsule Take 1.5 mg by mouth at bedtime.     lactulose (CHRONULAC) 10 GM/15ML solution Place 45 mLs (30 g total) into feeding tube every  6 (six) hours. (Patient not taking: Reported on 08/24/2022) 240 mL 0   oxyCODONE (OXY IR/ROXICODONE) 5 MG immediate release tablet Take 0.5 tablets (2.5 mg total) by mouth every 4 (four) hours as needed for severe pain or moderate pain. (Patient not taking: Reported on 12/03/2022) 15 tablet 0   Current Facility-Administered Medications on File Prior to Visit  Medication Dose Route Frequency Provider Last Rate Last Admin   thiamine (B-1) injection 100 mg  100 mg Intravenous Daily Greer Pickerel, MD        Allergies:  Allergies  Allergen Reactions   Gabapentin Swelling    Leg swelling Liver failure  Pt currently taking gabapentin   Sulfa Antibiotics Hives and Other (See Comments)    Other reaction(s): hives   Sulfasalazine Hives   Lamictal [Lamotrigine] Nausea And Vomiting and Other (See Comments)    Tremors, diplopia   Penicillins Hives, Itching and Other (See Comments)    As a child, "breathing, itching problem" Has patient had a PCN reaction causing immediate rash, facial/tongue/throat swelling, SOB or lightheadedness with hypotension: Yes Has patient had a PCN reaction causing severe rash involving mucus membranes or skin necrosis:  Yes Has patient had a PCN reaction that required hospitalization No Has patient had a PCN reaction occurring within the last 10 years: No If all of the above answers are "NO", then may proceed with Cephalosporin use.    Tylenol [Acetaminophen]     Due to liver failure    Morphine And Related Itching and Rash   Sulfur Itching    Vital Signs:  BP 105/74   Pulse 95   Ht '5\' 6"'$  (1.676 m)   Wt 228 lb (103.4 kg)   SpO2 98%   BMI 36.80 kg/m     Neurological Exam: MENTAL STATUS including orientation to time, place, person, recent and remote memory, attention span and concentration, language, and fund of knowledge is normal.  Speech is not dysarthric.  CRANIAL NERVES:    Pupils equal round and reactive to light.  Normal conjugate, extra-ocular eye movements in all directions of gaze.  No ptosis.  Face is symmetric. Palate elevates symmetrically.  Tongue is midline.  MOTOR:  No atrophy, fasciculations or abnormal movements.  No pronator drift.   Upper Extremity:  Right  Left  Deltoid  5/5   5/5   Biceps  5/5   5/5   Triceps  5/5   5/5   Infraspinatus 5/5  5/5  Medial pectoralis 5/5  5/5  Wrist extensors  5/5   5/5   Wrist flexors  5/5   5/5   Finger extensors  5/5   5/5   Finger flexors  5/5   5/5   Dorsal interossei  5/5   5/5   Abductor pollicis  5/5   5/5   Tone (Ashworth scale)  0  0   Lower Extremity:  Right  Left  Hip flexors  5-/5   5/5   Knee flexors  5/5   5/5   Knee extensors  5/5   5/5   Dorsiflexors  2/5   5/5   Plantarflexors  5/5   5/5   Inversion 4/5  5/5  Eversion 2/5  5/5  Toe extensors  2/5   5/5   Toe flexors  5/5   5/5   Tone (Ashworth scale)  0  0   MSRs:  Reflexes are 2+/4 throughout, 1+/4 at the knees and absent at the ankle bilaterally.  SENSORY:  Absent vibration  at the right ankle, reduced pin prick, temperature and light touch over the right lateral lower leg and dorsum of the foot.   COORDINATION/GAIT:  Normal finger-to- nose-finger.  Intact  rapid alternating movements bilaterally.  Gait is mildly wide-based, slow, unassisted, no steppage, slight intermittent dragging of the right foot  Data: EMG bilateral lower extremities 09/14/2018 performed at [facility]: This is a technically limited study due to the patient's right lower leg edema and active participation on exam.  This is an abnormal study with electrodiagnostic evidence of the following 1.  Severe sensory and motor axonal demyelinating polyneuropathy with chronic denervation 2.  Possible superimposed right peroneal neuropathy versus technical issues related to testing.  The absent right peroneal motor and sensory responses may be secondary to lower extremity edema and inability to properly record potentials.   Component     Latest Ref Rng & Units 12/11/2018  Zinc     60 - 130 mcg/dL 70  Copper     70 - 175 mcg/dL 24 (L)  Folate     >5.9 ng/mL 19.4  Vitamin B1 (Thiamine)     8 - 30 nmol/L 6 (L)  Vitamin B12     211 - 911 pg/mL 297   MRI brain 09/06/2022: No acute or reversible finding. No cause of the presenting symptoms is identified. Mild generalized volume loss.   Neuropsychiatric testing 10/13/2021: Overall, the most likely culprit for ongoing subjective dysfunction is a combination of the numerous medical and psychiatric factors described above.   IMPRESSION/PLAN: Chronic right peroneal mononeuropathy, mild progression.  NCS/EMG of the leg was declined - Continue supportive care - Referral to [facility] for right AFO - Start Cymbalta '30mg'$  daily - Fall precautions discussed History of multiple nutrient deficiency - vitamin B12, vitamin B1, and copper -  improved Memory loss.  Neuropsychological testing consistent with memory changes due to medical and psychiatric comorbidities, patient reassured there no evidence of dementia. Recommend that she continue to work with her psychiatric team to address mood disorder.   Return to clinic in 3 months  Thank  you for allowing me to participate in patient's care.  If I can answer any additional questions, I would be pleased to do so.    Sincerely,    Rosaelena Kemnitz K. Posey Pronto, DO

## 2022-12-03 NOTE — Patient Instructions (Addendum)
Referral to [facility] for right ankle foot orthotic  Start Cymbalta '30mg'$  daily  Return to clinic in 4 months

## 2022-12-10 DIAGNOSIS — E669 Obesity, unspecified: Secondary | ICD-10-CM | POA: Diagnosis not present

## 2022-12-10 DIAGNOSIS — L2082 Flexural eczema: Secondary | ICD-10-CM | POA: Diagnosis not present

## 2022-12-17 DIAGNOSIS — K219 Gastro-esophageal reflux disease without esophagitis: Secondary | ICD-10-CM | POA: Diagnosis not present

## 2022-12-17 DIAGNOSIS — K5909 Other constipation: Secondary | ICD-10-CM | POA: Diagnosis not present

## 2022-12-17 DIAGNOSIS — R110 Nausea: Secondary | ICD-10-CM | POA: Diagnosis not present

## 2022-12-17 DIAGNOSIS — K719 Toxic liver disease, unspecified: Secondary | ICD-10-CM | POA: Diagnosis not present

## 2022-12-19 ENCOUNTER — Other Ambulatory Visit: Payer: Self-pay | Admitting: Physician Assistant

## 2022-12-20 ENCOUNTER — Other Ambulatory Visit: Payer: Self-pay | Admitting: Physician Assistant

## 2022-12-26 ENCOUNTER — Other Ambulatory Visit: Payer: Self-pay | Admitting: Neurology

## 2023-01-06 ENCOUNTER — Encounter: Payer: Self-pay | Admitting: Radiology

## 2023-01-07 DIAGNOSIS — M21371 Foot drop, right foot: Secondary | ICD-10-CM | POA: Diagnosis not present

## 2023-01-25 DIAGNOSIS — K719 Toxic liver disease, unspecified: Secondary | ICD-10-CM | POA: Diagnosis not present

## 2023-02-05 DIAGNOSIS — J029 Acute pharyngitis, unspecified: Secondary | ICD-10-CM | POA: Diagnosis not present

## 2023-02-05 DIAGNOSIS — R0981 Nasal congestion: Secondary | ICD-10-CM | POA: Diagnosis not present

## 2023-02-05 DIAGNOSIS — R059 Cough, unspecified: Secondary | ICD-10-CM | POA: Diagnosis not present

## 2023-02-05 DIAGNOSIS — Z03818 Encounter for observation for suspected exposure to other biological agents ruled out: Secondary | ICD-10-CM | POA: Diagnosis not present

## 2023-02-05 DIAGNOSIS — R509 Fever, unspecified: Secondary | ICD-10-CM | POA: Diagnosis not present

## 2023-02-05 DIAGNOSIS — H9203 Otalgia, bilateral: Secondary | ICD-10-CM | POA: Diagnosis not present

## 2023-02-16 ENCOUNTER — Other Ambulatory Visit: Payer: Self-pay | Admitting: Internal Medicine

## 2023-02-16 DIAGNOSIS — K219 Gastro-esophageal reflux disease without esophagitis: Secondary | ICD-10-CM | POA: Diagnosis not present

## 2023-02-16 DIAGNOSIS — E559 Vitamin D deficiency, unspecified: Secondary | ICD-10-CM | POA: Diagnosis not present

## 2023-02-16 DIAGNOSIS — E039 Hypothyroidism, unspecified: Secondary | ICD-10-CM | POA: Diagnosis not present

## 2023-02-16 DIAGNOSIS — N644 Mastodynia: Secondary | ICD-10-CM

## 2023-02-16 DIAGNOSIS — I700 Atherosclerosis of aorta: Secondary | ICD-10-CM | POA: Diagnosis not present

## 2023-02-16 DIAGNOSIS — R69 Illness, unspecified: Secondary | ICD-10-CM | POA: Diagnosis not present

## 2023-02-16 DIAGNOSIS — K5909 Other constipation: Secondary | ICD-10-CM | POA: Diagnosis not present

## 2023-02-16 DIAGNOSIS — E785 Hyperlipidemia, unspecified: Secondary | ICD-10-CM | POA: Diagnosis not present

## 2023-02-16 DIAGNOSIS — Z0000 Encounter for general adult medical examination without abnormal findings: Secondary | ICD-10-CM | POA: Diagnosis not present

## 2023-02-16 DIAGNOSIS — G43119 Migraine with aura, intractable, without status migrainosus: Secondary | ICD-10-CM | POA: Diagnosis not present

## 2023-02-16 DIAGNOSIS — D509 Iron deficiency anemia, unspecified: Secondary | ICD-10-CM | POA: Diagnosis not present

## 2023-02-16 DIAGNOSIS — G5731 Lesion of lateral popliteal nerve, right lower limb: Secondary | ICD-10-CM | POA: Diagnosis not present

## 2023-02-18 ENCOUNTER — Other Ambulatory Visit: Payer: Self-pay | Admitting: Physician Assistant

## 2023-03-04 DIAGNOSIS — K769 Liver disease, unspecified: Secondary | ICD-10-CM | POA: Diagnosis not present

## 2023-03-04 DIAGNOSIS — K449 Diaphragmatic hernia without obstruction or gangrene: Secondary | ICD-10-CM | POA: Diagnosis not present

## 2023-03-07 ENCOUNTER — Ambulatory Visit
Admission: RE | Admit: 2023-03-07 | Discharge: 2023-03-07 | Disposition: A | Discharge status: Home / Self Care | Payer: Medicare HMO | Source: Ambulatory Visit | Attending: Internal Medicine | Admitting: Internal Medicine

## 2023-03-07 ENCOUNTER — Other Ambulatory Visit: Payer: Self-pay | Admitting: Gastroenterology

## 2023-03-07 ENCOUNTER — Ambulatory Visit: Payer: Medicare HMO

## 2023-03-07 DIAGNOSIS — N644 Mastodynia: Secondary | ICD-10-CM

## 2023-03-07 DIAGNOSIS — K449 Diaphragmatic hernia without obstruction or gangrene: Secondary | ICD-10-CM

## 2023-03-07 DIAGNOSIS — Z1231 Encounter for screening mammogram for malignant neoplasm of breast: Secondary | ICD-10-CM | POA: Diagnosis not present

## 2023-03-25 ENCOUNTER — Emergency Department (HOSPITAL_COMMUNITY)
Admission: EM | Admit: 2023-03-25 | Discharge: 2023-03-25 | Disposition: A | Discharge status: Home / Self Care | Payer: Medicare HMO | Attending: Emergency Medicine | Admitting: Emergency Medicine

## 2023-03-25 ENCOUNTER — Emergency Department (HOSPITAL_COMMUNITY): Payer: Medicare HMO

## 2023-03-25 ENCOUNTER — Encounter (HOSPITAL_COMMUNITY): Payer: Self-pay

## 2023-03-25 ENCOUNTER — Other Ambulatory Visit: Payer: Self-pay

## 2023-03-25 DIAGNOSIS — R0789 Other chest pain: Secondary | ICD-10-CM | POA: Insufficient documentation

## 2023-03-25 DIAGNOSIS — R911 Solitary pulmonary nodule: Secondary | ICD-10-CM | POA: Diagnosis not present

## 2023-03-25 DIAGNOSIS — I10 Essential (primary) hypertension: Secondary | ICD-10-CM | POA: Diagnosis not present

## 2023-03-25 DIAGNOSIS — K5730 Diverticulosis of large intestine without perforation or abscess without bleeding: Secondary | ICD-10-CM | POA: Diagnosis not present

## 2023-03-25 DIAGNOSIS — R0602 Shortness of breath: Secondary | ICD-10-CM | POA: Diagnosis not present

## 2023-03-25 DIAGNOSIS — Z743 Need for continuous supervision: Secondary | ICD-10-CM | POA: Diagnosis not present

## 2023-03-25 DIAGNOSIS — R110 Nausea: Secondary | ICD-10-CM | POA: Diagnosis not present

## 2023-03-25 DIAGNOSIS — R0902 Hypoxemia: Secondary | ICD-10-CM | POA: Diagnosis not present

## 2023-03-25 DIAGNOSIS — R079 Chest pain, unspecified: Secondary | ICD-10-CM | POA: Diagnosis not present

## 2023-03-25 DIAGNOSIS — K7689 Other specified diseases of liver: Secondary | ICD-10-CM | POA: Diagnosis not present

## 2023-03-25 DIAGNOSIS — R918 Other nonspecific abnormal finding of lung field: Secondary | ICD-10-CM | POA: Diagnosis not present

## 2023-03-25 LAB — CBC
HCT: 41.8 % (ref 36.0–46.0)
Hemoglobin: 13.7 g/dL (ref 12.0–15.0)
MCH: 33.9 pg (ref 26.0–34.0)
MCHC: 32.8 g/dL (ref 30.0–36.0)
MCV: 103.5 fL — ABNORMAL HIGH (ref 80.0–100.0)
Platelets: 262 10*3/uL (ref 150–400)
RBC: 4.04 MIL/uL (ref 3.87–5.11)
RDW: 12.2 % (ref 11.5–15.5)
WBC: 4.4 10*3/uL (ref 4.0–10.5)
nRBC: 0 % (ref 0.0–0.2)

## 2023-03-25 LAB — COMPREHENSIVE METABOLIC PANEL
ALT: 27 U/L (ref 0–44)
AST: 26 U/L (ref 15–41)
Albumin: 3.6 g/dL (ref 3.5–5.0)
Alkaline Phosphatase: 198 U/L — ABNORMAL HIGH (ref 38–126)
Anion gap: 10 (ref 5–15)
BUN: 13 mg/dL (ref 6–20)
CO2: 23 mmol/L (ref 22–32)
Calcium: 8.4 mg/dL — ABNORMAL LOW (ref 8.9–10.3)
Chloride: 106 mmol/L (ref 98–111)
Creatinine, Ser: 0.75 mg/dL (ref 0.44–1.00)
GFR, Estimated: >60 mL/min (ref >60)
Glucose, Bld: 119 mg/dL — ABNORMAL HIGH (ref 70–99)
Potassium: 3.6 mmol/L (ref 3.5–5.1)
Sodium: 139 mmol/L (ref 135–145)
Total Bilirubin: 0.7 mg/dL (ref 0.3–1.2)
Total Protein: 5.9 g/dL — ABNORMAL LOW (ref 6.5–8.1)

## 2023-03-25 LAB — TROPONIN I (HIGH SENSITIVITY)
Troponin I (High Sensitivity): 3 ng/L (ref <18)
Troponin I (High Sensitivity): 2 ng/L (ref <18)

## 2023-03-25 LAB — LIPASE, BLOOD: Lipase: 29 U/L (ref 11–51)

## 2023-03-25 MED ORDER — IOHEXOL 350 MG/ML SOLN
75.0000 mL | Freq: Once | INTRAVENOUS | Status: AC | PRN
  Administered 2023-03-25: 75 mL via INTRAVENOUS

## 2023-03-25 MED ORDER — ALUM & MAG HYDROXIDE-SIMETH 200-200-20 MG/5ML PO SUSP: 30.0000 mL | Freq: Once | ORAL | Status: DC

## 2023-03-25 NOTE — ED Provider Triage Note (Signed)
Emergency Medicine Provider Triage Evaluation Note  Jordan Russell , a 51 y.o. female  was evaluated in triage.  Patient started having symptoms of pressure-like pain in the center of her chest rating to her back.  It goes up towards her throat.  She has had intermittent episodes of the last couple months.  She mentioned to her primary doctor who suggested the next time she had an acute episode to come to the ED.  Patient has been feeling some fatigue.  No vomiting no diarrhea.  Patient denies any prior history of coronary artery disease.  No family history of coronary artery disease.  No history of PE or DVT.Marland Kitchen  Review of Systems  Positive: Chest pain Negative: Fever, vomiting  Physical Exam  BP 113/77   Pulse 81   Temp 98.6 F (37 C)   Resp 16   SpO2 95%  Gen:   Awake, no distress   Resp:  Normal effort  MSK:   Moves extremities without difficulty  Other:    Medical Decision Making  Medically screening exam initiated at 12:36 PM.  Appropriate orders placed.  ODETTA DALLEY was informed that the remainder of the evaluation will be completed by another provider, this initial triage assessment does not replace that evaluation, and the importance of remaining in the ED until their evaluation is complete.     Linwood Dibbles, MD 03/25/23 1236

## 2023-03-25 NOTE — ED Triage Notes (Signed)
Pt BIB [facility] from home d/t sudden onset Central CP that started an hr before arrival to ED, does radiate to her back between her shoulder blades & feels like it moves up her esophagus. This has happened 2 other times the past few months. A/Ox4, also been feeling weak the past 2 days mostly with exertion. Not given any ASA, was given 1 Nitro with improvement of pain from 4/10 to 2/10. 12 L from EMS showed peaked T waves with no elevation (per EMS). Hx of liver failure & takes Lithium for chronic reasons. 198/130, 97% on RA, 18g Rt AC.

## 2023-03-25 NOTE — ED Provider Notes (Signed)
[place] EMERGENCY DEPARTMENT AT [facility] Provider Note   CSN: [id number] Arrival date & time: 03/25/23  1153     History  Chief Complaint  Patient presents with   Chest Pain    Jordan Russell is a 52 y.o. female.  Patient presents with burning chest pain and pressure that onset this morning while she was lying on the couch.  Pain was in the center of her chest and radiate through to her mid back associate with nausea and shortness of breath.  She had intermittent episodes similar to this over the past 1 month but never this severe.  Her doctor told her to call the ambulance next time to happen to have it evaluated.  She denies any cardiac history.  Reports negative stress test last year.  Pain is now resolved after nitroglycerin from EMS.  She estimates the burning chest pain and back pain lasted about 45 minutes.  Was associate with shortness of breath and nausea but no vomiting.  No abdominal pain, cough or fever.  No leg pain or leg swelling.  No history of PE or DVT. Reports of chronic liver disease and previous lithium use but no longer on lithium. Also of esophagitis and gastritis and previous gastric bypass and gastroparesis.  She is on a PPI.  States this does not feel similar to her GI issues. EMS reported that she had this pain 2 more times in the previous month but never had it evaluated.  Remains chest pain-free now after receiving nitroglycerin.  The history is provided by the patient.  Chest Pain Associated symptoms: nausea and shortness of breath   Associated symptoms: no abdominal pain, no dizziness, no fever, no headache, no vomiting and no weakness        Home Medications Prior to Admission medications   Medication Sig Start Date End Date Taking? Authorizing Provider  ALPRAZolam Prudy Feeler) 1 MG tablet Take 1 mg by mouth 3 (three) times daily as needed for anxiety. 06/30/22   [provider]  Carboxymeth-Glyc-Polysorb PF (REFRESH OPTIVE  MEGA-3) 0.5-1-0.5 % SOLN Place 1 drop into both eyes 3 (three) times daily as needed (dry/irritated eyes.).    [provider]  dicyclomine (BENTYL) 20 MG tablet Take 1 tablet (20 mg total) by mouth 3 (three) times daily before meals. Patient taking differently: Take 20 mg by mouth 3 (three) times daily as needed for spasms. 08/16/21   Palumbo, April, MD  DULoxetine (CYMBALTA) 30 MG capsule Take 1 capsule (30 mg total) by mouth daily. 12/03/22   Patel, Roxana Hires K, DO  escitalopram (LEXAPRO) 20 MG tablet Take 20 mg by mouth in the morning. 02/10/21   [provider]  esomeprazole (NEXIUM) 40 MG capsule Take 40 mg by mouth 2 (two) times daily. 02/10/21   [provider]  furosemide (LASIX) 20 MG tablet Take 20 mg by mouth in the morning. 02/08/19   [provider]  gabapentin (NEURONTIN) 100 MG capsule TAKE 1 CAPSULE BY MOUTH THREE TIMES A DAY 02/18/23   Kirtland Bouchard, PA-C  glycerin adult 2 g suppository Place 1 suppository rectally as needed for constipation. 07/13/22   Gloris Manchester, MD  lactulose (CHRONULAC) 10 GM/15ML solution Place 45 mLs (30 g total) into feeding tube every 6 (six) hours. Patient not taking: Reported on 08/24/2022 06/09/18   Burnadette Pop, MD  levothyroxine (SYNTHROID) 88 MCG tablet Take 88 mcg by mouth daily before breakfast. 12/19/20   [provider]  LINZESS 290 MCG  CAPS capsule Take 290 mcg by mouth daily before breakfast. 12/01/20   [provider]  lithium 300 MG tablet Take 300 mg by mouth 2 (two) times daily. 02/01/19   [provider]  MAGNESIUM CITRATE PO Take 1 capsule by mouth daily.    [provider]  midodrine (PROAMATINE) 2.5 MG tablet Take 1 tablet (2.5 mg total) by mouth every 6 (six) hours as needed (low b/p). 03/23/22   Georgeanna Lea, MD  ondansetron (ZOFRAN-ODT) 8 MG disintegrating tablet Take 1 tablet (8 mg total) by mouth every 6 (six) hours as needed for nausea. 08/31/22   Gaynelle Adu, MD   oxyCODONE (OXY IR/ROXICODONE) 5 MG immediate release tablet Take 0.5 tablets (2.5 mg total) by mouth every 4 (four) hours as needed for severe pain or moderate pain. Patient not taking: Reported on 12/03/2022 09/06/22   Rolly Salter, MD  polyethylene glycol (MIRALAX / GLYCOLAX) 17 g packet Take 17 g by mouth daily. For bowel cleanout, mix 6-8 packets in a large Gatorade or similar drink. 07/13/22   Gloris Manchester, MD  psyllium (METAMUCIL) 0.52 g capsule Take 1 capsule (0.52 g total) by mouth daily. 07/13/22   Gloris Manchester, MD  QUEtiapine (SEROQUEL) 100 MG tablet Take 400-600 mg by mouth at bedtime.    [provider]  rifaximin (XIFAXAN) 550 MG TABS tablet Take 550 mg by mouth 2 (two) times daily.    [provider]  rizatriptan (MAXALT) 10 MG tablet Take 10 mg by mouth every 2 (two) hours as needed for migraine. 11/10/20   [provider]  spironolactone (ALDACTONE) 50 MG tablet Take 50 mg by mouth in the morning. 06/23/18   [provider]  topiramate (TOPAMAX) 25 MG tablet Take 25 mg by mouth 2 (two) times daily. 02/20/21   [provider]  ursodiol (ACTIGALL) 500 MG tablet Take 500 mg by mouth in the morning and at bedtime.    [provider]  VRAYLAR 1.5 MG capsule Take 1.5 mg by mouth at bedtime. 06/30/22   [provider]      Allergies    Gabapentin, Sulfa antibiotics, Sulfasalazine, Lamictal [lamotrigine], Penicillins, Tylenol [acetaminophen], Morphine and codeine, and Sulfur    Review of Systems   Review of Systems  Constitutional:  Negative for activity change, appetite change and fever.  HENT:  Negative for congestion and rhinorrhea.   Respiratory:  Positive for chest tightness and shortness of breath.   Cardiovascular:  Positive for chest pain.  Gastrointestinal:  Positive for nausea. Negative for abdominal pain and vomiting.  Genitourinary:  Negative for dysuria and hematuria.  Musculoskeletal:  Negative for arthralgias and  myalgias.  Skin:  Negative for rash.  Neurological:  Negative for dizziness, weakness and headaches.   all other systems are negative except as noted in the HPI and PMH.    Physical Exam Updated Vital Signs BP 113/77   Pulse 81   Temp 98.6 F (37 C)   Resp 16   SpO2 95%  Physical Exam Vitals and nursing note reviewed.  Constitutional:      General: She is not in acute distress.    Appearance: She is well-developed.  HENT:     Head: Normocephalic and atraumatic.     Mouth/Throat:     Pharynx: No oropharyngeal exudate.  Eyes:     Conjunctiva/sclera: Conjunctivae normal.     Pupils: Pupils are equal, round, and reactive to light.  Neck:     Comments: No meningismus.  Cardiovascular:     Rate and Rhythm: Normal rate and regular rhythm.     Heart sounds: Normal heart sounds. No murmur heard.    Comments: Equal radial pulses and grip strength Pulmonary:     Effort: Pulmonary effort is normal. No respiratory distress.     Breath sounds: Normal breath sounds.  Abdominal:     Palpations: Abdomen is soft.     Tenderness: There is no abdominal tenderness. There is no guarding or rebound.  Musculoskeletal:        General: No tenderness. Normal range of motion.     Cervical back: Normal range of motion and neck supple.     Comments: Equal DP and PT pulse  Skin:    General: Skin is warm.  Neurological:     Mental Status: She is alert and oriented to person, place, and time.     Cranial Nerves: No cranial nerve deficit.     Motor: No abnormal muscle tone.     Coordination: Coordination normal.     Comments:  5/5 strength throughout. CN 2-12 intact.Equal grip strength.   Psychiatric:        Behavior: Behavior normal.     ED Results / Procedures / Treatments   Labs (all labs ordered are listed, but only abnormal results are displayed) Labs Reviewed  CBC - Abnormal; Notable for the following components:      Result Value   MCV 103.5 (*)    All other components within normal  limits  COMPREHENSIVE METABOLIC PANEL - Abnormal; Notable for the following components:   Glucose, Bld 119 (*)    Calcium 8.4 (*)    Total Protein 5.9 (*)    Alkaline Phosphatase 198 (*)    All other components within normal limits  LIPASE, BLOOD  AMMONIA  LITHIUM LEVEL  TROPONIN I (HIGH SENSITIVITY)  TROPONIN I (HIGH SENSITIVITY)    EKG EKG Interpretation  Date/Time:  Friday Mar 25 2023 11:59:55 EDT Ventricular Rate:  82 PR Interval:  158 QRS Duration: 70 QT Interval:  350 QTC Calculation: 408 R Axis:   -6 Text Interpretation: Normal sinus rhythm Normal ECG When compared with ECG of 13-Jul-2022 19:15, PREVIOUS ECG IS PRESENT No significant change since last tracing Confirmed by Linwood Dibbles [phone]) on 03/25/2023 12:11:55 PM  Radiology CT Angio Chest/Abd/Pel for Dissection W and/or Wo Contrast  Result Date: 03/25/2023 CLINICAL DATA:  Acute aortic syndrome suspected EXAM: CT ANGIOGRAPHY CHEST, ABDOMEN AND PELVIS TECHNIQUE: Non-contrast CT of the chest was initially obtained. Multidetector CT imaging through the chest, abdomen and pelvis was performed using the standard protocol during bolus administration of intravenous contrast. Multiplanar reconstructed images and MIPs were obtained and reviewed to evaluate the vascular anatomy. RADIATION DOSE REDUCTION: This exam was performed according to the departmental dose-optimization program which includes automated exposure control, adjustment of the mA and/or kV according to patient size and/or use of iterative reconstruction technique. CONTRAST:  75mL OMNIPAQUE IOHEXOL 350 MG/ML SOLN COMPARISON:  07/13/2022 FINDINGS: CTA CHEST FINDINGS Cardiovascular: There is no demonstrable mural hematoma in the noncontrast images in thoracic aorta. There is no demonstrable intimal flap in the thoracic aorta in the postcontrast images. Major branches of the thoracic aorta appear patent. There are no intraluminal filling defects in pulmonary artery branches.  Mediastinum/Nodes: No significant lymphadenopathy is seen. There is frothy density in the lumen of thoracic esophagus suggesting possible gastroesophageal reflux. Lungs/Pleura: In image 29 of series 6, there is 3-4 mm nodular density in the anterior right  lower lung field which has not changed significantly. There are no new discrete lung nodules. There is no focal pulmonary consolidation. Small linear densities in the posterior lower lung fields may suggest minimal scarring or subsegmental atelectasis. There is no pleural effusion or pneumothorax. Musculoskeletal: No acute findings are seen. Review of the MIP images confirms the above findings. CTA ABDOMEN AND PELVIS FINDINGS VASCULAR Aorta: There is no dissection or focal aneurysm. Celiac: There is narrowing of the proximal course of celiac. Major branches of celiac appear to be patent. SMA: Unremarkable. Renals: No significant stenosis. Two renal arteries are noted on both sides. IMA: Patent Inflow: Unremarkable Veins: Unremarkable. Review of the MIP images confirms the above findings. NON-VASCULAR Hepatobiliary: There is 1.6 cm hypervascular structure in the liver in image 115. This finding was not distinctly seen on the previous study. There is no dilation of bile ducts. Surgical clips are seen in gallbladder fossa. Pancreas: No focal abnormalities are seen. Spleen: Unremarkable. Adrenals/Urinary Tract: Adrenals are unremarkable. There is no hydronephrosis. There are no renal or ureteral stones. Urinary bladder is unremarkable. Stomach/Bowel: Surgical staples are seen in stomach suggesting previous bariatric surgery. There is no significant small bowel dilation. The appendix is not dilated. There is no significant wall thickening in colon. Scattered diverticula are seen in colon without signs of focal acute diverticulitis. Lymphatic: Unremarkable. Reproductive: Uterus is not seen.  There are no adnexal masses. Other: There is no ascites or pneumoperitoneum.  Umbilical hernia containing fat is seen. Musculoskeletal: No acute findings are seen. Review of the MIP images confirms the above findings. IMPRESSION: There is no evidence of dissection in the thoracic and abdominal aorta. Major branches of thoracic and abdominal aorta are patent. There is narrowing of proximal course of celiac axis. This may be due to extrinsic compression caused by adjacent ligament or atherosclerosis. There is no evidence of pulmonary artery embolism. There is no focal pulmonary consolidation. Small linear densities in the posterior lower lung fields may suggest scarring or subsegmental atelectasis. There is 4 mm nodular density in the anterior right lower lung field with no significant change. No follow-up needed if patient is low-risk.This recommendation follows the consensus statement: Guidelines for Management of Incidental Pulmonary Nodules Detected on CT Images: From the Fleischner Society 2017; Radiology 2017; 284:228-243. There is narrowing of proximal course of celiac axis which may be due to extrinsic compression by adjacent ligamentous structure or atherosclerotic narrowing. No significant atherosclerotic changes are noted in thoracic and abdominal aorta. There is no evidence of intestinal obstruction or pneumoperitoneum. There is no hydronephrosis. There is possible new 1.6 cm hypervascular structure in the right lobe of liver which may suggest if flash filling hemangioma or some other space-occupying lesion. Follow-up MRI in nonemergent setting may be considered. Postsurgical changes are noted in stomach. Possible gastroesophageal reflux. Diverticulosis of colon without signs of focal acute diverticulitis. Electronically Signed   By: Ernie Avena M.D.   On: 03/25/2023 15:37   DG Chest 2 View  Result Date: 03/25/2023 CLINICAL DATA:  Chest pain EXAM: CHEST - 2 VIEW COMPARISON:  CT chest dated July 13, 2022 FINDINGS: The heart size and mediastinal contours are within  normal limits. Both lungs are clear. The visualized skeletal structures are unremarkable. IMPRESSION: No active cardiopulmonary disease. Electronically Signed   By: Larose Hires D.O.   On: 03/25/2023 13:17    Procedures Procedures    Medications Ordered in ED Medications  alum & mag hydroxide-simeth (MAALOX/MYLANTA) 200-200-20 MG/5ML suspension 30 mL (has no  administration in time range)    ED Course/ Medical Decision Making/ A&P                             Medical Decision Making Amount and/or Complexity of Data Reviewed Independent Historian: EMS Labs: ordered. Decision-making details documented in ED Course. Radiology: ordered and independent interpretation performed. Decision-making details documented in ED Course. ECG/medicine tests: ordered and independent interpretation performed. Decision-making details documented in ED Course.  Risk Prescription drug management.  Episode of chest pain radiating to the back associate with nausea and shortness of breath, now resolved after nitroglycerin.  EKG on arrival shows no acute ST elevation.  Chart review shows patient had CT coronary study in 2022 with 0 calcium score  Patient chest pain-free after GI cocktail.  Troponin negative.  LFTs and lipase are normal.  Low suspicion for ACS given recent normal CT coronary study in 2022.  Will trend troponin.  Given ongoing chest pain with radiation to the back, CT angiogram will be obtained to rule for aortic dissection or other acute aortic pathology.  Remains chest pain-free.  If CTA is reassuring anticipate outpatient follow-up with her cardiologist.  Continue PPI, avoid alcohol, caffeine, NSAID medications, spicy foods.  Addendum: CTA negative for aortic dissection.  Does show several incidental findings including hemangioma of the liver, lung nodule, celiac artery compression with need for outpatient follow-up.  Results discussed with patient at bedside.  Troponin negative x 2.   Remains chest pain-free.  Continue PPI.  Follow-up with cardiology, vascular surgery as well as PCP for lung nodule and hemangioma of liver findings.         Final Clinical Impression(s) / ED Diagnoses Final diagnoses:  Atypical chest pain  Lung nodule    Rx / DC Orders ED Discharge Orders     None         Christabel Camire, Jeannett Senior, MD 03/25/23 1708

## 2023-03-25 NOTE — Discharge Instructions (Addendum)
There is no evidence of heart attack or blood clot in the morning.  Your CT scan did show several incidental findings including a lung nodule which should be checked again in 6 months, a collection of blood vessels in your liver which needs to have an MRI.  There is also some compression of the artery in your stomach called a celiac artery.  you can follow-up with the vascular surgeon regarding this. But this does not appear to be causing your symptoms today.  Follow-up with your cardiologist for recheck next week.  Return to the ED with exertional chest pain, pain associate with shortness of breath, nausea, vomiting, sweating, other concerns.  Continue your stomach medications as prescribed.  Avoid alcohol, caffeine, NSAID medications, spicy foods

## 2023-03-30 ENCOUNTER — Other Ambulatory Visit: Payer: Self-pay | Admitting: Gastroenterology

## 2023-03-30 DIAGNOSIS — K7689 Other specified diseases of liver: Secondary | ICD-10-CM

## 2023-03-30 DIAGNOSIS — K869 Disease of pancreas, unspecified: Secondary | ICD-10-CM

## 2023-04-01 ENCOUNTER — Other Ambulatory Visit: Payer: Medicare HMO

## 2023-04-04 ENCOUNTER — Ambulatory Visit: Payer: Medicare HMO | Admitting: Neurology

## 2023-04-04 ENCOUNTER — Other Ambulatory Visit: Payer: Self-pay | Admitting: Physician Assistant

## 2023-04-04 ENCOUNTER — Encounter: Payer: Self-pay | Admitting: Neurology

## 2023-04-04 VITALS — BP 107/84 | HR 96 | Ht 66.0 in | Wt 232.0 lb

## 2023-04-04 DIAGNOSIS — R1013 Epigastric pain: Secondary | ICD-10-CM | POA: Diagnosis not present

## 2023-04-04 DIAGNOSIS — G319 Degenerative disease of nervous system, unspecified: Secondary | ICD-10-CM | POA: Diagnosis not present

## 2023-04-04 DIAGNOSIS — F17200 Nicotine dependence, unspecified, uncomplicated: Secondary | ICD-10-CM | POA: Diagnosis not present

## 2023-04-04 DIAGNOSIS — K769 Liver disease, unspecified: Secondary | ICD-10-CM | POA: Diagnosis not present

## 2023-04-04 DIAGNOSIS — G5731 Lesion of lateral popliteal nerve, right lower limb: Secondary | ICD-10-CM

## 2023-04-04 DIAGNOSIS — E892 Postprocedural hypoparathyroidism: Secondary | ICD-10-CM | POA: Diagnosis not present

## 2023-04-04 DIAGNOSIS — E639 Nutritional deficiency, unspecified: Secondary | ICD-10-CM | POA: Diagnosis not present

## 2023-04-04 DIAGNOSIS — I771 Stricture of artery: Secondary | ICD-10-CM | POA: Diagnosis not present

## 2023-04-04 DIAGNOSIS — M069 Rheumatoid arthritis, unspecified: Secondary | ICD-10-CM | POA: Diagnosis not present

## 2023-04-04 DIAGNOSIS — R911 Solitary pulmonary nodule: Secondary | ICD-10-CM | POA: Diagnosis not present

## 2023-04-04 MED ORDER — DULOXETINE HCL 30 MG PO CPEP: 30.0000 mg | ORAL_CAPSULE | Freq: Two times a day (BID) | ORAL | 3 refills | Status: DC

## 2023-04-04 NOTE — Patient Instructions (Signed)
Continue to use brace  Increase Cymbalta to 30mg  twice daily  Return to clinic in 1 year

## 2023-04-04 NOTE — Progress Notes (Signed)
Follow-up Visit   Date: 04/04/23   Jordan Russell MRN: [id number] DOB: February 09, 1971   Interim History: Jordan Russell is a 52 y.o. right-handed Caucasian female with hypothyroidism, GERD, hypertension, bipolar depression, hyperparathyroidism, fibromyalgia, rheumatoid arthritis, irritable bowel syndrome, history of gastric bypass with malnutrition and copper deficiency returning to the clinic for follow-up of right peroneal neuropathy, falls and memory loss.  The patient was accompanied to the clinic by self.     History of present illness: In August 2019, she was hospitalized for sepsis, hepatic encephalopathy, and drug-induced liver injury (plaquenil/eternacept) from 8/4 - 06/09/2018 at [facility] and transferred to [facility] where she stayed 8/9 - 06/23/2018.  Hospitalization was notable for healthcare associated pneumonia, E.coli sepsis, delirium, and liver injury.  She was transferred to rehab facility and was noted to have severe generalized weakness, diagnosed with critical illness myopathy.  She completed PT and slowly improved where she was able to stand and able to walk with a walker.  During this time, she noticed that her right foot was weak, specifically, difficulty raising her right foot.  She does not have similar weakness on the left foot.  She has numbness of the right great toe and middle toe.  She has numbness over the right lateral thigh for many years.  She has chronic low back pain, which is localized. She does not have radicular pain of the legs. She admits to crossing her legs.  She had NCS/EMG of the right leg at [facility] which showed axonal neuropathy and overlapping right peroneal neuropathy.    She has been using a walker since Spring 2019 because of generalized weakness and falls.  She underwent gastric bypass surgery in 2001 and lost 150lb.  She admits to having very poor appetite and continues to eat only one meal/daily.  She is active and spends 60-min  on her stationary bike daily.  She has fear of gaining weight.    She has previously been evaluated evaluated at [facility] neurology by Dr. Canary Brim for leg weakness and cognitive complaints in 2019.  MRI brain dated 03/09/2018 showed mild generalized volume loss, no focal findings and was recommended to undergo neuropsychological testing.   UPDATE 07/30/2021:  She was last seen in 2020 for right peroneal mononeuropathy and multiple nutritional deficiencies.  She was lost to follow-up and presents today with weakness and tingling of the hands and legs, muscle cramps, and memory loss.   Starting March 2022, she began having weakness and tingling in both legs and hands.  She also feels burning and itching deep inside her legs.  She also complains of painful muscle spasms and cramps of the hands and feet. Previously labs indicated multiple nutrient deficiency including vitamin B12, vitamin B1, and copper.   She has not been taking supplements for quite some time. She still has poor nutrition and does not eat regular meals.   She is also having problems with her memory.  She walks into rooms and is confused why she is there.  She is unable to read books anymore, because she is unable to recall the plot.  She gets confused and constant repeating herself.  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  She lives at home with her husband and son.  UPDATE 12/03/2022:  She was last seen here in 07/2021.  Today, she presents with worsening right leg weakness and numbness/tingling.  She is falling 1-2 times per month because her foot feels unsteady  and tends to drag.  Most of the falls have occurred when she is not using a cane.  She has history or right peroneal neuropathy and has completed PT several times over the years with no improvement.  She denies numbness/tingling in the left foot or hands.  She continues to have problems with memory, often repeating herself.  Prior neuropsychological testing from  10/2021 shows cognitive impairment due to medical and psychiatric comorbidities, no evidence of dementia.  Labs including vitamin B12, vitamin B1, and copper are much improved and normal.   UPDATE 04/04/2023:  She is here for follow-up visit.  She has noticed improved pain since starting Cymbalta 30mg /d.  She is also using a brace which prevents falls.  Overall, she feels that she is doing much better.  No new complaints.   Medications:  Current Outpatient Medications on File Prior to Visit  Medication Sig Dispense Refill   ALPRAZolam (XANAX) 1 MG tablet Take 1 mg by mouth 3 (three) times daily as needed for anxiety.     Carboxymeth-Glyc-Polysorb PF (REFRESH OPTIVE MEGA-3) 0.5-1-0.5 % SOLN Place 1 drop into both eyes 3 (three) times daily as needed (dry/irritated eyes.).     dicyclomine (BENTYL) 20 MG tablet Take 1 tablet (20 mg total) by mouth 3 (three) times daily before meals. (Patient taking differently: Take 20 mg by mouth 3 (three) times daily as needed for spasms.) 21 tablet 0   DULoxetine (CYMBALTA) 30 MG capsule Take 1 capsule (30 mg total) by mouth daily. 30 capsule 3   esomeprazole (NEXIUM) 40 MG capsule Take 40 mg by mouth 2 (two) times daily.     furosemide (LASIX) 20 MG tablet Take 20 mg by mouth in the morning.     gabapentin (NEURONTIN) 100 MG capsule TAKE 1 CAPSULE BY MOUTH THREE TIMES A DAY (Patient taking differently: Take 100 mg by mouth 3 (three) times daily.) 90 capsule 0   glycerin adult 2 g suppository Place 1 suppository rectally as needed for constipation. (Patient not taking: Reported on 03/25/2023) 12 suppository 0   lactulose (CHRONULAC) 10 GM/15ML solution Place 45 mLs (30 g total) into feeding tube every 6 (six) hours. (Patient not taking: Reported on 08/24/2022) 240 mL 0   levothyroxine (SYNTHROID) 88 MCG tablet Take 88 mcg by mouth daily before breakfast.     LINZESS 290 MCG CAPS capsule Take 290 mcg by mouth daily before breakfast.     MAGNESIUM CITRATE PO Take 250 mg  by mouth daily.     midodrine (PROAMATINE) 2.5 MG tablet Take 1 tablet (2.5 mg total) by mouth every 6 (six) hours as needed (low b/p). 180 tablet 2   ondansetron (ZOFRAN-ODT) 8 MG disintegrating tablet Take 1 tablet (8 mg total) by mouth every 6 (six) hours as needed for nausea. 20 tablet 0   oxyCODONE (OXY IR/ROXICODONE) 5 MG immediate release tablet Take 0.5 tablets (2.5 mg total) by mouth every 4 (four) hours as needed for severe pain or moderate pain. (Patient not taking: Reported on 12/03/2022) 15 tablet 0   polyethylene glycol (MIRALAX / GLYCOLAX) 17 g packet Take 17 g by mouth daily. For bowel cleanout, mix 6-8 packets in a large Gatorade or similar drink. (Patient not taking: Reported on 03/25/2023) 30 each 0   psyllium (METAMUCIL) 0.52 g capsule Take 1 capsule (0.52 g total) by mouth daily. (Patient not taking: Reported on 03/25/2023) 30 capsule 2   QUEtiapine (SEROQUEL) 100 MG tablet Take 400-600 mg by mouth at bedtime.  rifaximin (XIFAXAN) 550 MG TABS tablet Take 550 mg by mouth 2 (two) times daily.     rizatriptan (MAXALT) 10 MG tablet Take 10 mg by mouth every 2 (two) hours as needed for migraine.     spironolactone (ALDACTONE) 50 MG tablet Take 50 mg by mouth in the morning.     ursodiol (ACTIGALL) 500 MG tablet Take 500 mg by mouth in the morning and at bedtime.     VRAYLAR 1.5 MG capsule Take 1.5 mg by mouth at bedtime.     Current Facility-Administered Medications on File Prior to Visit  Medication Dose Route Frequency Provider Last Rate Last Admin   thiamine (B-1) injection 100 mg  100 mg Intravenous Daily Gaynelle Adu, MD        Allergies:  Allergies  Allergen Reactions   Gabapentin Swelling    Leg swelling Liver failure  Pt currently taking gabapentin   Sulfa Antibiotics Hives and Other (See Comments)    Other reaction(s): hives   Sulfasalazine Hives   Lamictal [Lamotrigine] Nausea And Vomiting and Other (See Comments)    Tremors, diplopia   Penicillins Hives,  Itching and Other (See Comments)    As a child, "breathing, itching problem" Has patient had a PCN reaction causing immediate rash, facial/tongue/throat swelling, SOB or lightheadedness with hypotension: Yes Has patient had a PCN reaction causing severe rash involving mucus membranes or skin necrosis: Yes Has patient had a PCN reaction that required hospitalization No Has patient had a PCN reaction occurring within the last 10 years: No If all of the above answers are "NO", then may proceed with Cephalosporin use.    Tylenol [Acetaminophen]     Due to liver failure    Morphine And Codeine Itching and Rash   Sulfur Itching    Vital Signs:  BP 107/84   Pulse 96   Ht 5\' 6"  (1.676 m)   Wt 232 lb (105.2 kg)   SpO2 92%   BMI 37.45 kg/m     Neurological Exam: MENTAL STATUS including orientation to time, place, person, recent and remote memory, attention span and concentration, language, and fund of knowledge is normal.  Speech is not dysarthric.  CRANIAL NERVES:    Normal conjugate, extra-ocular eye movements in all directions of gaze.  No ptosis.  Face is symmetric.  MOTOR:  No atrophy, fasciculations or abnormal movements.  No pronator drift.   Upper Extremity:  Right  Left  Deltoid  5/5   5/5   Biceps  5/5   5/5   Triceps  5/5   5/5   Infraspinatus 5/5  5/5  Medial pectoralis 5/5  5/5  Wrist extensors  5/5   5/5   Wrist flexors  5/5   5/5   Finger extensors  5/5   5/5   Finger flexors  5/5   5/5   Dorsal interossei  5/5   5/5   Abductor pollicis  5/5   5/5   Tone (Ashworth scale)  0  0   Lower Extremity:  Right  Left  Hip flexors  5-/5   5/5   Knee flexors  5/5   5/5   Knee extensors  5/5   5/5   Dorsiflexors  2/5   5/5   Plantarflexors  5/5   5/5   Inversion 4/5  5/5  Eversion 2/5  5/5  Toe extensors  2/5   5/5   Toe flexors  5/5   5/5   Tone (Ashworth scale)  0  0   MSRs:  Reflexes are 2+/4 throughout, 1+/4 at the knees and absent at the ankle  bilaterally.  SENSORY:  Absent vibration at the right ankle, reduced pin prick, temperature and light touch over the right lateral lower leg.   COORDINATION/GAIT:   Gait is mildly wide-based, assisted with AFO, stable.  Data: EMG bilateral lower extremities 09/14/2018 performed at [facility]: This is a technically limited study due to the patient's right lower leg edema and active participation on exam.  This is an abnormal study with electrodiagnostic evidence of the following 1.  Severe sensory and motor axonal demyelinating polyneuropathy with chronic denervation 2.  Possible superimposed right peroneal neuropathy versus technical issues related to testing.  The absent right peroneal motor and sensory responses may be secondary to lower extremity edema and inability to properly record potentials.   Component     Latest Ref Rng & Units 12/11/2018  Zinc     60 - 130 mcg/dL 70  Copper     70 - 161 mcg/dL 24 (L)  Folate     >0.9 ng/mL 19.4  Vitamin B1 (Thiamine)     8 - 30 nmol/L 6 (L)  Vitamin B12     211 - 911 pg/mL 297   MRI brain 09/06/2022: No acute or reversible finding. No cause of the presenting symptoms is identified. Mild generalized volume loss.   Neuropsychiatric testing 10/13/2021: Overall, the most likely culprit for ongoing subjective dysfunction is a combination of the numerous medical and psychiatric factors described above.   IMPRESSION/PLAN: Chronic right peroneal mononeuropathy, mild progression.  NCS/EMG of the leg was declined  - Increase Cymbalta 30mg  twice daily  - Continue supportive care  - Continue to use R AFO - Fall precautions discussed  History of multiple nutrient deficiency - vitamin B12, vitamin B1, and copper -  improved and stable.   Return to clinic in 1 year  Thank you for allowing me to participate in patient's care.  If I can answer any additional questions, I would be pleased to do so.    Sincerely,    Kierria Feigenbaum K. Allena Katz,  DO

## 2023-04-06 ENCOUNTER — Ambulatory Visit: Payer: Medicare HMO | Admitting: Vascular Surgery

## 2023-04-06 ENCOUNTER — Encounter: Payer: Self-pay | Admitting: Vascular Surgery

## 2023-04-06 VITALS — BP 100/74 | HR 95 | Temp 98.4°F | Resp 20 | Ht 66.0 in | Wt 231.0 lb

## 2023-04-06 DIAGNOSIS — I774 Celiac artery compression syndrome: Secondary | ICD-10-CM | POA: Diagnosis not present

## 2023-04-06 NOTE — Progress Notes (Signed)
Patient ID: Jordan Russell, female   DOB: [birth year], 52 y.o.   MRN: [id number]  Reason for Consult: New Patient (Initial Visit)   Referred by Thana Ates, MD  Subjective:     HPI:  Jordan Russell is a 52 y.o. female with history of gastric bypass and states that now she is in liver failure and has been for several years.  More recently she was having back pain radiating from the chest she was evaluated with CT scan.  She states that on more than 1 occasion she has had epigastric pain with eating and she has had some difficulty eating since that time but again this may go back several months given her history of gastric bypass with revision surgery last October.  She has never had any vascular procedures to speak of denies any previous issues with pain with eating or food fear.  Her weight has really been stable over the past several months.  Past Medical History:  Diagnosis Date   Abnormal involuntary movement 10/01/2021   Acute liver failure without hepatic coma    AKI (acute kidney injury) 06/05/2018   Alopecia 10/01/2021   Anasarca 06/08/2018   Arthralgia of multiple joints 12/17/2016   Asthma    Ataxia 10/01/2021   Atypical chest pain 10/01/2021   Bilateral knee pain 02/05/2014   Bipolar disorder, unspecified 08/03/2014   Calculus of kidney 12/17/2016   Carpal tunnel syndrome of left wrist 12/17/2016   Cholestasis 10/01/2021   Chondromalacia 02/26/2014   Chronic idiopathic constipation 10/01/2021   Chronic kidney disease    Chronic rhinitis 10/01/2021   Colitis 06/10/2018   Contact dermatitis 10/01/2021   Corn of toe 10/01/2021   Cutaneous eruption 12/17/2016   Delirium due to multiple etiologies 06/11/2018   Drug-induced liver injury 06/08/2018   E coli bacteremia    Edema of lower extremity 10/01/2021   Elevated LFTs 06/08/2018   Encephalopathy, hepatic (HCC)    Enthesopathy of hip region 10/01/2021   Epigastric pain 06/04/2015   Eustachian tube  disorder 10/01/2021   Fatigue 10/01/2021   Fatty infiltration of liver 06/05/2018   Fibromyalgia    Gastroparesis    Generalized anxiety disorder 03/10/2016   GERD (gastroesophageal reflux disease)    HCAP (healthcare-associated pneumonia) 06/09/2018   Hemorrhoid 10/01/2021   Hiatal hernia    Hidradenitis 08/03/2014   History of gastric bypass 12/14/2013   History of kidney stones    History of nutritional disorder 08/03/2014   HPTH (hyperparathyroidism) 12/17/2016   Hydradenitis 12/17/2016   Hydronephrosis with renal and ureteral calculus obstruction 12/17/2016   Hyperammonemia 06/08/2018   Hyperbilirubinemia 06/05/2018   Hyperesthesia 10/01/2021   Hyperlipidemia 10/01/2021   Hypoalbuminemia 06/10/2018   Hypotension    Hypothyroidism    Hypoxia 06/05/2018   IBS (irritable bowel syndrome)    Insomnia 10/01/2021   Intractable migraine with aura without status migrainosus 12/17/2016   Iron deficiency anemia 12/17/2016   Jaundice 06/10/2018   Left ureteral stone    Major depressive disorder    s/p  bilateral inferior parathyroidectomy 08-03-2010   Microhematuria 01/04/2019   Nocturia 12/17/2016   Pain in left hip 09/08/2017   Peripheral venous insufficiency 10/01/2021   Pharyngeal dysphagia 10/01/2021   Pneumonia    Polyneuropathy 10/01/2021   PONV (postoperative nausea and vomiting)    and claustrophobic with mask   Pruritus 10/01/2021   Recurrent falls 10/01/2021   Rheumatoid arthritis 08/03/2014   Self mutilating behavior    Severe malnutrition  Severe sepsis 06/04/2018   Sinus bradycardia 06/29/2021   Status post bariatric surgery 12/14/2013   Trochanteric bursitis, left hip 09/08/2017   Urgency of urination    Varicose veins of bilateral lower extremities with pain 06/28/2016   Vitamin B12 deficiency (non anemic) 10/01/2021   Vitamin D deficiency 12/14/2013   Vulvitis 10/01/2021   Wears glasses    Family History  Problem Relation Age of Onset   Breast  cancer Mother    Anxiety disorder Brother    Depression Brother    Breast cancer Maternal Grandmother    Bipolar disorder Son    Breast cancer Maternal Aunt    Past Surgical History:  Procedure Laterality Date   ABDOMINAL HYSTERECTOMY     BALLOON DILATION N/A 10/01/2014   Procedure: BALLOON DILATION;  Surgeon: Charolett Bumpers, MD;  Location: [facility];  Service: Endoscopy;  Laterality: N/A;   BIOPSY  03/05/2021   Procedure: BIOPSY;  Surgeon: Kerin Salen, MD;  Location: [facility];  Service: Gastroenterology;;   CYSTO/  BILATERAL RETROGRADE PYELOGRAM  11/20/2000   CYSTO/  RIGHT RETROGRADE PYELOGRAM/  RIGHT URETEROSCOPY/  STENT PLACEMENT  12/16/2006   CYSTOSCOPY/URETEROSCOPY/HOLMIUM LASER/STENT PLACEMENT Left 11/09/2016   Procedure: CYSTOSCOPY/URETEROSCOPY/HOLMIUM LASER/STENT PLACEMENT;  Surgeon: Hildred Laser, MD;  Location: [facility];  Service: Urology;  Laterality: Left;  90 MINS  [phone]    ESOPHAGEAL MANOMETRY N/A 12/23/2014   Procedure: ESOPHAGEAL MANOMETRY (EM);  Surgeon: Charolett Bumpers, MD;  Location: [facility];  Service: Endoscopy;  Laterality: N/A;   ESOPHAGOGASTRODUODENOSCOPY  last one 03-28-2015   ESOPHAGOGASTRODUODENOSCOPY (EGD) WITH PROPOFOL N/A 10/01/2014   Procedure: ESOPHAGOGASTRODUODENOSCOPY (EGD) WITH PROPOFOL;  Surgeon: Charolett Bumpers, MD;  Location: [facility];  Service: Endoscopy;  Laterality: N/A;   ESOPHAGOGASTRODUODENOSCOPY (EGD) WITH PROPOFOL N/A 03/05/2021   Procedure: ESOPHAGOGASTRODUODENOSCOPY (EGD) WITH PROPOFOL;  Surgeon: Kerin Salen, MD;  Location: [facility];  Service: Gastroenterology;  Laterality: N/A;   EXTRACORPOREAL SHOCK WAVE LITHOTRIPSY  multiple times since age 81   HOLMIUM LASER APPLICATION Left 11/09/2016   Procedure: HOLMIUM LASER APPLICATION;  Surgeon: Hildred Laser, MD;  Location: [facility];  Service: Urology;  Laterality: Left;   KNEE ARTHROSCOPY Right 03-04-2014  [facility]   w/ Arthrotomy    LAPAROSCOPIC ASSISTED VAGINAL HYSTERECTOMY  12/28/2005   LAPAROSCOPIC CHOLECYSTECTOMY  01/17/2004   LAPAROSCOPY N/A 08/30/2022   Procedure: LAPAROSCOPY DIAGNOSTIC, LAPAROSCOPIC RESECTION OF CANDY CANE LIMB;  Surgeon: Gaynelle Adu, MD;  Location: [facility];  Service: General;  Laterality: N/A;   LAPAROSCOPY LEFT OVARIAN CYSTECTOMY/  BILATERAL TUBAL LIGATION  02/26/2003   LEFT URETEROSCOPIC STONE EXTRACTION /  STENT PLACEMENT  07/09/2002   NECK EXPLORATION/  BILATERAL INFERIOR PARATHYROIDECTOMY  08/03/2010   RIGHT URETERAL DILATION/  URETEROSCOPIC STONE EXTRACTION  06/12/2010   ROUX-EN-Y GASTRIC BYPASS  2001   SOLYX TRANSURETHRAL SLING/  POSTERIOR PELVIC FLOOR SACROSPINOUS REPAIR  02/21/2009   and Cysto/  Bilateral ureteral stent placement   TONSILLECTOMY AND ADENOIDECTOMY     UPPER GI ENDOSCOPY N/A 08/10/2021   Procedure: UPPER GI ENDOSCOPY;  Surgeon: Gaynelle Adu, MD;  Location: [facility];  Service: General;  Laterality: N/A;   UPPER GI ENDOSCOPY N/A 08/30/2022   Procedure: UPPER GI ENDOSCOPY;  Surgeon: Gaynelle Adu, MD;  Location: [facility];  Service: General;  Laterality: N/A;   WRIST GANGLION EXCISION Right 01/28/2000   XI ROBOTIC ASSISTED HIATAL HERNIA REPAIR N/A 08/10/2021   Procedure: XI ROBOTIC ASSISTED HIATAL HERNIA REPAIR WITH MESH, ROBOTIC LYSIS OF ADHESIONS;  Surgeon: Gaynelle Adu, MD;  Location: [facility];  Service: General;  Laterality: N/A;    Short Social History:  Social History   Tobacco Use   Smoking status: Former    Packs/day: 1.00    Years: 20.00    Additional pack years: 0.00    Total pack years: 20.00    Types: Cigarettes    Quit date: 01/30/2022    Years since quitting: 1.1   Smokeless tobacco: Never  Substance Use Topics   Alcohol use: Not Currently    Comment: very rare    Allergies  Allergen Reactions   Gabapentin Swelling    Leg swelling Liver failure  Pt currently taking gabapentin   Sulfa Antibiotics Hives and Other (See Comments)    Other  reaction(s): hives   Sulfasalazine Hives   Lamictal [Lamotrigine] Nausea And Vomiting and Other (See Comments)    Tremors, diplopia   Penicillins Hives, Itching and Other (See Comments)    As a child, "breathing, itching problem" Has patient had a PCN reaction causing immediate rash, facial/tongue/throat swelling, SOB or lightheadedness with hypotension: Yes Has patient had a PCN reaction causing severe rash involving mucus membranes or skin necrosis: Yes Has patient had a PCN reaction that required hospitalization No Has patient had a PCN reaction occurring within the last 10 years: No If all of the above answers are "NO", then may proceed with Cephalosporin use.    Tylenol [Acetaminophen]     Due to liver failure    Morphine And Codeine Itching and Rash   Sulfur Itching    Current Outpatient Medications  Medication Sig Dispense Refill   ALPRAZolam (XANAX) 1 MG tablet Take 1 mg by mouth 3 (three) times daily as needed for anxiety.     Carboxymeth-Glyc-Polysorb PF (REFRESH OPTIVE MEGA-3) 0.5-1-0.5 % SOLN Place 1 drop into both eyes 3 (three) times daily as needed (dry/irritated eyes.).     dicyclomine (BENTYL) 20 MG tablet Take 1 tablet (20 mg total) by mouth 3 (three) times daily before meals. (Patient taking differently: Take 20 mg by mouth 3 (three) times daily as needed for spasms.) 21 tablet 0   DULoxetine (CYMBALTA) 30 MG capsule Take 1 capsule (30 mg total) by mouth 2 (two) times daily. 180 capsule 3   esomeprazole (NEXIUM) 40 MG capsule Take 40 mg by mouth 2 (two) times daily.     furosemide (LASIX) 20 MG tablet Take 20 mg by mouth in the morning.     gabapentin (NEURONTIN) 100 MG capsule TAKE 1 CAPSULE BY MOUTH THREE TIMES A DAY (Patient taking differently: Take 100 mg by mouth 3 (three) times daily.) 90 capsule 0   levothyroxine (SYNTHROID) 88 MCG tablet Take 88 mcg by mouth daily before breakfast.     LINZESS 290 MCG CAPS capsule Take 290 mcg by mouth daily before breakfast.      MAGNESIUM CITRATE PO Take 250 mg by mouth daily.     midodrine (PROAMATINE) 2.5 MG tablet Take 1 tablet (2.5 mg total) by mouth every 6 (six) hours as needed (low b/p). 180 tablet 2   ondansetron (ZOFRAN-ODT) 8 MG disintegrating tablet Take 1 tablet (8 mg total) by mouth every 6 (six) hours as needed for nausea. 20 tablet 0   polyethylene glycol (MIRALAX / GLYCOLAX) 17 g packet Take 17 g by mouth daily. For bowel cleanout, mix 6-8 packets in a large Gatorade or similar drink. 30 each 0   QUEtiapine (SEROQUEL) 100 MG tablet Take 400-600 mg by mouth  at bedtime.     rifaximin (XIFAXAN) 550 MG TABS tablet Take 550 mg by mouth 2 (two) times daily.     rizatriptan (MAXALT) 10 MG tablet Take 10 mg by mouth every 2 (two) hours as needed for migraine.     spironolactone (ALDACTONE) 50 MG tablet Take 50 mg by mouth in the morning.     ursodiol (ACTIGALL) 500 MG tablet Take 500 mg by mouth in the morning and at bedtime.     VRAYLAR 1.5 MG capsule Take 1.5 mg by mouth at bedtime.     No current facility-administered medications for this visit.   Facility-Administered Medications Ordered in Other Visits  Medication Dose Route Frequency Provider Last Rate Last Admin   thiamine (B-1) injection 100 mg  100 mg Intravenous Daily Gaynelle Adu, MD        Review of Systems  Constitutional:  Constitutional negative. HENT: HENT negative.  Eyes: Eyes negative.  Cardiovascular: Positive for chest pain.  GI: Positive for abdominal pain.  Musculoskeletal: Positive for back pain.  Skin: Skin negative.  Hematologic: Hematologic/lymphatic negative.  Psychiatric: Psychiatric negative.        Objective:  Objective   Vitals:   04/06/23 1203  BP: 100/74  Pulse: 95  Resp: 20  Temp: 98.4 F (36.9 C)  SpO2: 93%     Physical Exam HENT:     Head: Normocephalic.     Mouth/Throat:     Mouth: Mucous membranes are moist.  Eyes:     Pupils: Pupils are equal, round, and reactive to light.  Cardiovascular:      Rate and Rhythm: Normal rate.     Pulses: Normal pulses.  Pulmonary:     Effort: Pulmonary effort is normal.  Abdominal:     General: Abdomen is flat.     Palpations: There is no mass.  Musculoskeletal:        General: Normal range of motion.     Cervical back: Normal range of motion.     Right lower leg: No edema.     Left lower leg: No edema.  Skin:    General: Skin is warm.     Capillary Refill: Capillary refill takes less than 2 seconds.  Neurological:     General: No focal deficit present.     Mental Status: She is alert.  Psychiatric:        Mood and Affect: Mood normal.        Thought Content: Thought content normal.        Judgment: Judgment normal.     Data: CTA IMPRESSION: There is no evidence of dissection in the thoracic and abdominal aorta. Major branches of thoracic and abdominal aorta are patent. There is narrowing of proximal course of celiac axis. This may be due to extrinsic compression caused by adjacent ligament or atherosclerosis.   There is no evidence of pulmonary artery embolism. There is no focal pulmonary consolidation. Small linear densities in the posterior lower lung fields may suggest scarring or subsegmental atelectasis.   There is 4 mm nodular density in the anterior right lower lung field with no significant change. No follow-up needed if patient is low-risk.This recommendation follows the consensus statement: Guidelines for Management of Incidental Pulmonary Nodules Detected on CT Images: From the Fleischner Society 2017; Radiology 2017; 284:228-243.   There is narrowing of proximal course of celiac axis which may be due to extrinsic compression by adjacent ligamentous structure or atherosclerotic narrowing. No significant atherosclerotic changes are noted in  thoracic and abdominal aorta.   There is no evidence of intestinal obstruction or pneumoperitoneum. There is no hydronephrosis.   There is possible new 1.6 cm  hypervascular structure in the right lobe of liver which may suggest if flash filling hemangioma or some other space-occupying lesion. Follow-up MRI in nonemergent setting may be considered.   Postsurgical changes are noted in stomach. Possible gastroesophageal reflux. Diverticulosis of colon without signs of focal acute diverticulitis.       Assessment/Plan:    51 year old female with history as above does have some concern for median arcuate ligament syndrome with epigastric pain and eating and the CT scan is certainly suggestive.  I discussed with her that she would be difficult repair given her history of gastric bypass and revision but then we can start with mesenteric duplex with inspiration and expiration to evaluate for clinically significant median arcuate ligament syndrome and if in fact she does have suggestion with ultrasound we can refer her to [facility] for consideration of surgical intervention.  All questions were answered she demonstrates good understanding today.     Maeola Harman MD [facility]

## 2023-04-07 ENCOUNTER — Other Ambulatory Visit: Payer: Self-pay

## 2023-04-07 DIAGNOSIS — I774 Celiac artery compression syndrome: Secondary | ICD-10-CM

## 2023-04-11 NOTE — Progress Notes (Unsigned)
Cardiology Clinic Note   Date: 04/13/2023 ID: SHUNTELL OSTDIEK, DOB 1971-07-02, MRN [id number]  Primary Cardiologist:  Gypsy Balsam, MD  Patient Profile    Jordan Russell is a 52 y.o. female who presents to the clinic today for hospital follow up.   Past medical history significant for: Chest pain. Nuclear stress test 07/08/2021: Normal, low risk study. Coronary CTA 10/14/2021: Calcium score 0. Bradycardia. 14-day ZIO 07/21/2021: Min HR 34 bpm, max HR 114 bpm, average HR 58 bpm.  1 run of SVT lasting 8 beats, max rate 114 bpm, average rate 102 bpm.  Triggered events associated with sinus rhythm. Peripheral venous insufficiency. Hypotension. Migraines. GERD. Hypothyroidism. Hyperparathyroidism. Obesity. S/p gastric bypass ~1999. Tobacco abuse. Quit smoking 04/09/2023.    History of Present Illness    Jordan Russell was first evaluated by Dr. Bing Matter on 06/29/2021 for hypotension and bradycardia at the request of Dr. Andrey Campanile.  She was pending surgery to repair a problem with her esophagus and a hiatal hernia.  She underwent nuclear stress test for risk stratification which was a normal low risk study.  Patient was last seen in the office by Dr. Bing Matter on 12/02/2021 for continued chest pain and positional dizziness.  Chest pain was felt to be related to GI issues and she was instructed to follow with them.  She was provided with a prescription for midodrine 2.5 mg every 6 hours as needed.  Increasing sodium and fluid contraindicated secondary to lower extremity edema.  Today, patient reports 4 weeks ago while sitting on her couch she developed squeezing pain in her epigastrium radiating to right jaw with associated shortness of breath lasting 15 to 20 minutes.  It resolved on its own and did not return until 2 weeks ago when she had a similar episode while in the shower.  She was evaluated in the ER.  Troponin negative x 2.  EKG with no ischemic changes.  Pain relieved  with GI cocktail.  CTA chest abdomen pelvis showed no acute abdominal findings.  This chest pain is identical to pain she had previously when she saw Dr. Bing Matter initially.  She underwent 2 GI surgeries in 2023.  She reports continued episodes of similar pain in the epigastrium.  The last 2 episodes scared her as they were more intense than previous.  She is anticipating needing more GI surgery and has testing for further evaluation coming up.  She has liver disease which causes lower extremity edema which she manages with diuretics and elevation.  Lower extremities are typically normal in the morning and edema will progress throughout the day if she is unable to elevate.  She reports chronic weakness/fatigue.  Activities are somewhat limited secondary to weakness and fatigue as well as bilateral neuropathy and dropfoot on the right.  She does stay active doing light to moderate household chores, yard work, and other activities close to home.    ROS: All other systems reviewed and are otherwise negative except as noted in History of Present Illness.  Studies Reviewed    ECG performed in the ED on 03/25/2023 is reviewed and shows NSR, 82 bpm.  Significant change from prior EKG 07/13/2022.          Physical Exam    VS:  BP 126/76   Pulse 66   Ht 5\' 7"  (1.702 m)   Wt 232 lb 12.8 oz (105.6 kg)   SpO2 98%   BMI 36.46 kg/m  , BMI Body mass index is 36.46 kg/m.  GEN: Well nourished, well developed, in no acute distress. Neck: No JVD or carotid bruits. Cardiac: RRR. No murmurs. No rubs or gallops.   Respiratory:  Respirations regular and unlabored. Clear to auscultation without rales, wheezing or rhonchi. GI: Soft, nontender, nondistended. Extremities: Radials/DP/PT 2+ and equal bilaterally. No clubbing or cyanosis.  Trace edema bilateral lower extremities.  Brace in place for dropfoot on the right. Skin: Warm and dry, no rash. Neuro: Strength intact.  Assessment & Plan    Chronic atypical  chest pain/epigastric pain.  Coronary CTA December 2022 showed calcium score of 0.  Patient reports 2 episodes of squeezing epigastric chest pain recently.  Last episode 2 weeks ago caused her to call EMS and she was transported to the ER.  Chest pain resolved with GI cocktail.  It was felt symptoms were related to her GI issues and she has testing coming up for further evaluation.  She reports pain she had that brought her to the ER was identical to pain she has had in the past just more intense.  Given negative nuclear stress testing and coronary CTA with calcium score of 0 as well as pain identical to prior pain related to GI issues and do not feel further cardiac testing is warranted.  Patient is in full agreement.  Directed to contact the office if she has recurrence of pain or there is a change in character or quality of the pain. Bradycardia.  14-day ZIO September 2022 showed 1 run of SVT lasting 8 beats.  Triggered events associated with sinus rhythm.  Patient denies palpitations.  Heart rate today 66 bpm. Chronic lower extremity edema/peripheral venous insufficiency.  Patient reports chronic lower extremity edema secondary to liver disease.  She manages edema with diuretics and elevation.  She reports lower extremities are normal upon awakening and edema will slowly progress throughout the day if she is not able to elevate her legs.  Trace lower extremity edema bilaterally on exam.  Continue spironolactone and Lasix. Preoperative cardiovascular risk assessment.  Patient is anticipating further GI surgeries.  According to the RCRI, patient has a 0.9% risk of MACE. Patient reports activity equivalent to 6.86 METS (per DASI).  Based on ACC/AHA guidelines, Jordan Russell would be at acceptable risk for the planned procedure without further cardiovascular testing.     Disposition: Return as needed.         Signed, Etta Grandchild. Venus Gilles, DNP, NP-C

## 2023-04-13 ENCOUNTER — Ambulatory Visit: Payer: Medicare HMO | Attending: Student | Admitting: Student

## 2023-04-13 ENCOUNTER — Encounter: Payer: Self-pay | Admitting: Student

## 2023-04-13 VITALS — BP 126/76 | HR 66 | Ht 67.0 in | Wt 232.8 lb

## 2023-04-13 DIAGNOSIS — G8929 Other chronic pain: Secondary | ICD-10-CM

## 2023-04-13 DIAGNOSIS — R600 Localized edema: Secondary | ICD-10-CM

## 2023-04-13 DIAGNOSIS — R1013 Epigastric pain: Secondary | ICD-10-CM | POA: Diagnosis not present

## 2023-04-13 DIAGNOSIS — R001 Bradycardia, unspecified: Secondary | ICD-10-CM

## 2023-04-13 DIAGNOSIS — Z01810 Encounter for preprocedural cardiovascular examination: Secondary | ICD-10-CM | POA: Diagnosis not present

## 2023-04-13 DIAGNOSIS — R079 Chest pain, unspecified: Secondary | ICD-10-CM

## 2023-04-13 DIAGNOSIS — I872 Venous insufficiency (chronic) (peripheral): Secondary | ICD-10-CM

## 2023-04-13 NOTE — Patient Instructions (Signed)
Medication Instructions:  Your physician recommends that you continue on your current medications as directed. Please refer to the Current Medication list given to you today.  *If you need a refill on your cardiac medications before your next appointment, please call your pharmacy*   Lab Work: NONE If you have labs (blood work) drawn today and your tests are completely normal, you will receive your results only by: MyChart Message (if you have MyChart) OR A paper copy in the mail If you have any lab test that is abnormal or we need to change your treatment, we will call you to review the results.   Testing/Procedures: NONE   Follow-Up: At [facility], you and your health needs are our priority.  As part of our continuing mission to provide you with exceptional heart care, we have created designated Provider Care Teams.  These Care Teams include your primary Cardiologist (physician) and Advanced Practice Providers (APPs -  Physician Assistants and Nurse Practitioners) who all work together to provide you with the care you need, when you need it.  We recommend signing up for the patient portal called "MyChart".  Sign up information is provided on this After Visit Summary.  MyChart is used to connect with patients for Virtual Visits (Telemedicine).  Patients are able to view lab/test results, encounter notes, upcoming appointments, etc.  Non-urgent messages can be sent to your provider as well.   To learn more about what you can do with MyChart, go to [email or web address].    Your next appointment:   As Needed   Provider:   Gypsy Balsam, MD

## 2023-04-15 ENCOUNTER — Ambulatory Visit
Admission: RE | Admit: 2023-04-15 | Discharge: 2023-04-15 | Disposition: A | Discharge status: Home / Self Care | Payer: Medicare HMO | Source: Ambulatory Visit | Attending: Gastroenterology | Admitting: Gastroenterology

## 2023-04-15 ENCOUNTER — Other Ambulatory Visit: Payer: Self-pay | Admitting: Gastroenterology

## 2023-04-15 DIAGNOSIS — K2100 Gastro-esophageal reflux disease with esophagitis, without bleeding: Secondary | ICD-10-CM | POA: Diagnosis not present

## 2023-04-15 DIAGNOSIS — K449 Diaphragmatic hernia without obstruction or gangrene: Secondary | ICD-10-CM

## 2023-05-04 ENCOUNTER — Encounter: Payer: Self-pay | Admitting: Gastroenterology

## 2023-05-06 DIAGNOSIS — R197 Diarrhea, unspecified: Secondary | ICD-10-CM | POA: Diagnosis not present

## 2023-05-07 ENCOUNTER — Ambulatory Visit
Admission: RE | Admit: 2023-05-07 | Discharge: 2023-05-07 | Disposition: A | Discharge status: Home / Self Care | Payer: Medicare HMO | Source: Ambulatory Visit | Attending: Gastroenterology | Admitting: Gastroenterology

## 2023-05-07 DIAGNOSIS — K869 Disease of pancreas, unspecified: Secondary | ICD-10-CM

## 2023-05-07 DIAGNOSIS — K7689 Other specified diseases of liver: Secondary | ICD-10-CM

## 2023-05-07 DIAGNOSIS — K769 Liver disease, unspecified: Secondary | ICD-10-CM | POA: Diagnosis not present

## 2023-05-07 MED ORDER — GADOPICLENOL 0.5 MMOL/ML IV SOLN
10.0000 mL | Freq: Once | INTRAVENOUS | Status: AC | PRN
  Administered 2023-05-07: 10 mL via INTRAVENOUS

## 2023-05-18 ENCOUNTER — Ambulatory Visit: Payer: Medicare HMO | Admitting: Vascular Surgery

## 2023-05-18 ENCOUNTER — Ambulatory Visit (HOSPITAL_COMMUNITY)
Admission: RE | Admit: 2023-05-18 | Discharge: 2023-05-18 | Disposition: A | Discharge status: Home / Self Care | Payer: Medicare HMO | Source: Ambulatory Visit | Attending: Vascular Surgery | Admitting: Vascular Surgery

## 2023-05-18 ENCOUNTER — Encounter: Payer: Self-pay | Admitting: Vascular Surgery

## 2023-05-18 VITALS — BP 91/64 | HR 78 | Temp 97.9°F | Resp 20 | Ht 67.0 in | Wt 234.0 lb

## 2023-05-18 DIAGNOSIS — I774 Celiac artery compression syndrome: Secondary | ICD-10-CM

## 2023-05-18 NOTE — Progress Notes (Signed)
Patient ID: Jordan Russell, female   DOB: 27-Aug-1971, 52 y.o.   MRN: [id number]  Reason for Consult: Follow-up   Referred by Thana Ates, MD  Subjective:     HPI:  Jordan Russell is a 52 y.o. female extensive abdominal pain remote gastric bypass and more recent paraesophageal hernia which had recurred.  I saw her just over 1 month ago with chest and back pain and difficulty eating.  A CT scan suggested median arcuate ligament syndrome she is now back with mesenteric duplex.  She states that her pain is unchanged.  She has not lost any weight.  Continues to have difficulty with eating.  Past Medical History:  Diagnosis Date   Abnormal involuntary movement 10/01/2021   Acute liver failure without hepatic coma    AKI (acute kidney injury) 06/05/2018   Alopecia 10/01/2021   Anasarca 06/08/2018   Arthralgia of multiple joints 12/17/2016   Asthma    Ataxia 10/01/2021   Atypical chest pain 10/01/2021   Bilateral knee pain 02/05/2014   Bipolar disorder, unspecified 08/03/2014   Calculus of kidney 12/17/2016   Carpal tunnel syndrome of left wrist 12/17/2016   Cholestasis 10/01/2021   Chondromalacia 02/26/2014   Chronic idiopathic constipation 10/01/2021   Chronic kidney disease    Chronic rhinitis 10/01/2021   Colitis 06/10/2018   Contact dermatitis 10/01/2021   Corn of toe 10/01/2021   Cutaneous eruption 12/17/2016   Delirium due to multiple etiologies 06/11/2018   Drug-induced liver injury 06/08/2018   E coli bacteremia    Edema of lower extremity 10/01/2021   Elevated LFTs 06/08/2018   Encephalopathy, hepatic (HCC)    Enthesopathy of hip region 10/01/2021   Epigastric pain 06/04/2015   Eustachian tube disorder 10/01/2021   Fatigue 10/01/2021   Fatty infiltration of liver 06/05/2018   Fibromyalgia    Gastroparesis    Generalized anxiety disorder 03/10/2016   GERD (gastroesophageal reflux disease)    HCAP (healthcare-associated pneumonia) 06/09/2018    Hemorrhoid 10/01/2021   Hiatal hernia    Hidradenitis 08/03/2014   History of gastric bypass 12/14/2013   History of kidney stones    History of nutritional disorder 08/03/2014   HPTH (hyperparathyroidism) 12/17/2016   Hydradenitis 12/17/2016   Hydronephrosis with renal and ureteral calculus obstruction 12/17/2016   Hyperammonemia 06/08/2018   Hyperbilirubinemia 06/05/2018   Hyperesthesia 10/01/2021   Hyperlipidemia 10/01/2021   Hypoalbuminemia 06/10/2018   Hypotension    Hypothyroidism    Hypoxia 06/05/2018   IBS (irritable bowel syndrome)    Insomnia 10/01/2021   Intractable migraine with aura without status migrainosus 12/17/2016   Iron deficiency anemia 12/17/2016   Jaundice 06/10/2018   Left ureteral stone    Major depressive disorder    s/p  bilateral inferior parathyroidectomy 08-03-2010   Microhematuria 01/04/2019   Nocturia 12/17/2016   Pain in left hip 09/08/2017   Peripheral venous insufficiency 10/01/2021   Pharyngeal dysphagia 10/01/2021   Pneumonia    Polyneuropathy 10/01/2021   PONV (postoperative nausea and vomiting)    and claustrophobic with mask   Pruritus 10/01/2021   Recurrent falls 10/01/2021   Rheumatoid arthritis 08/03/2014   Self mutilating behavior    Severe malnutrition    Severe sepsis 06/04/2018   Sinus bradycardia 06/29/2021   Status post bariatric surgery 12/14/2013   Trochanteric bursitis, left hip 09/08/2017   Urgency of urination    Varicose veins of bilateral lower extremities with pain 06/28/2016   Vitamin B12 deficiency (non anemic) 10/01/2021  Vitamin D deficiency 12/14/2013   Vulvitis 10/01/2021   Wears glasses    Family History  Problem Relation Age of Onset   Breast cancer Mother    Anxiety disorder Brother    Depression Brother    Breast cancer Maternal Grandmother    Bipolar disorder Son    Breast cancer Maternal Aunt    Past Surgical History:  Procedure Laterality Date   ABDOMINAL HYSTERECTOMY     BALLOON  DILATION N/A 10/01/2014   Procedure: BALLOON DILATION;  Surgeon: Charolett Bumpers, MD;  Location: [facility];  Service: Endoscopy;  Laterality: N/A;   BIOPSY  03/05/2021   Procedure: BIOPSY;  Surgeon: Kerin Salen, MD;  Location: [facility];  Service: Gastroenterology;;   CYSTO/  BILATERAL RETROGRADE PYELOGRAM  11/20/2000   CYSTO/  RIGHT RETROGRADE PYELOGRAM/  RIGHT URETEROSCOPY/  STENT PLACEMENT  12/16/2006   CYSTOSCOPY/URETEROSCOPY/HOLMIUM LASER/STENT PLACEMENT Left 11/09/2016   Procedure: CYSTOSCOPY/URETEROSCOPY/HOLMIUM LASER/STENT PLACEMENT;  Surgeon: Hildred Laser, MD;  Location: [facility];  Service: Urology;  Laterality: Left;  90 MINS  [phone]    ESOPHAGEAL MANOMETRY N/A 12/23/2014   Procedure: ESOPHAGEAL MANOMETRY (EM);  Surgeon: Charolett Bumpers, MD;  Location: [facility];  Service: Endoscopy;  Laterality: N/A;   ESOPHAGOGASTRODUODENOSCOPY  last one 03-28-2015   ESOPHAGOGASTRODUODENOSCOPY (EGD) WITH PROPOFOL N/A 10/01/2014   Procedure: ESOPHAGOGASTRODUODENOSCOPY (EGD) WITH PROPOFOL;  Surgeon: Charolett Bumpers, MD;  Location: [facility];  Service: Endoscopy;  Laterality: N/A;   ESOPHAGOGASTRODUODENOSCOPY (EGD) WITH PROPOFOL N/A 03/05/2021   Procedure: ESOPHAGOGASTRODUODENOSCOPY (EGD) WITH PROPOFOL;  Surgeon: Kerin Salen, MD;  Location: [facility];  Service: Gastroenterology;  Laterality: N/A;   EXTRACORPOREAL SHOCK WAVE LITHOTRIPSY  multiple times since age 47   HOLMIUM LASER APPLICATION Left 11/09/2016   Procedure: HOLMIUM LASER APPLICATION;  Surgeon: Hildred Laser, MD;  Location[facility];  Service: Urology;  Laterality: Left;   KNEE ARTHROSCOPY Right 03-04-2014  [facility]   w/ Arthrotomy   LAPAROSCOPIC ASSISTED VAGINAL HYSTERECTOMY  12/28/2005   LAPAROSCOPIC CHOLECYSTECTOMY  01/17/2004   LAPAROSCOPY N/A 08/30/2022   Procedure: LAPAROSCOPY DIAGNOSTIC, LAPAROSCOPIC RESECTION OF CANDY CANE LIMB;  Surgeon: Gaynelle Adu, MD;  Location: [facility];  Service: General;  Laterality: N/A;   LAPAROSCOPY LEFT OVARIAN CYSTECTOMY/  BILATERAL TUBAL LIGATION  02/26/2003   LEFT URETEROSCOPIC STONE EXTRACTION /  STENT PLACEMENT  07/09/2002   NECK EXPLORATION/  BILATERAL INFERIOR PARATHYROIDECTOMY  08/03/2010   RIGHT URETERAL DILATION/  URETEROSCOPIC STONE EXTRACTION  06/12/2010   ROUX-EN-Y GASTRIC BYPASS  2001   SOLYX TRANSURETHRAL SLING/  POSTERIOR PELVIC FLOOR SACROSPINOUS REPAIR  02/21/2009   and Cysto/  Bilateral ureteral stent placement   TONSILLECTOMY AND ADENOIDECTOMY     UPPER GI ENDOSCOPY N/A 08/10/2021   Procedure: UPPER GI ENDOSCOPY;  Surgeon: Gaynelle Adu, MD;  Location: [facility];  Service: General;  Laterality: N/A;   UPPER GI ENDOSCOPY N/A 08/30/2022   Procedure: UPPER GI ENDOSCOPY;  Surgeon: Gaynelle Adu, MD;  Location: [facility];  Service: General;  Laterality: N/A;   WRIST GANGLION EXCISION Right 01/28/2000   XI ROBOTIC ASSISTED HIATAL HERNIA REPAIR N/A 08/10/2021   Procedure: XI ROBOTIC ASSISTED HIATAL HERNIA REPAIR WITH MESH, ROBOTIC LYSIS OF ADHESIONS;  Surgeon: Gaynelle Adu, MD;  Location: [facility];  Service: General;  Laterality: N/A;    Short Social History:  Social History   Tobacco Use   Smoking status: Former    Current packs/day: 0.00    Average packs/day: 1 pack/day for 20.0 years (  20.0 ttl pk-yrs)    Types: Cigarettes    Start date: 01/30/2002    Quit date: 01/30/2022    Years since quitting: 1.2   Smokeless tobacco: Never  Substance Use Topics   Alcohol use: Not Currently    Comment: very rare    Allergies  Allergen Reactions   Sulfa Antibiotics Hives and Other (See Comments)    Other reaction(s): hives   Sulfasalazine Hives   Lamictal [Lamotrigine] Nausea And Vomiting and Other (See Comments)    Tremors, diplopia   Penicillins Hives, Itching and Other (See Comments)    As a child, "breathing, itching problem" Has patient had a PCN reaction causing immediate rash, facial/tongue/throat swelling, SOB or  lightheadedness with hypotension: Yes Has patient had a PCN reaction causing severe rash involving mucus membranes or skin necrosis: Yes Has patient had a PCN reaction that required hospitalization No Has patient had a PCN reaction occurring within the last 10 years: No If all of the above answers are "NO", then may proceed with Cephalosporin use.    Tylenol [Acetaminophen]     Due to liver failure    Morphine And Codeine Itching and Rash   Sulfur Itching    Current Outpatient Medications  Medication Sig Dispense Refill   ALPRAZolam (XANAX) 1 MG tablet Take 1 mg by mouth 3 (three) times daily as needed for anxiety.     Carboxymeth-Glyc-Polysorb PF (REFRESH OPTIVE MEGA-3) 0.5-1-0.5 % SOLN Place 1 drop into both eyes 3 (three) times daily as needed (dry/irritated eyes.).     dicyclomine (BENTYL) 20 MG tablet Take 1 tablet (20 mg total) by mouth 3 (three) times daily before meals. (Patient taking differently: Take 20 mg by mouth 3 (three) times daily as needed for spasms.) 21 tablet 0   DULoxetine (CYMBALTA) 30 MG capsule Take 1 capsule (30 mg total) by mouth 2 (two) times daily. 180 capsule 3   esomeprazole (NEXIUM) 40 MG capsule Take 40 mg by mouth 2 (two) times daily.     furosemide (LASIX) 20 MG tablet Take 20 mg by mouth in the morning.     gabapentin (NEURONTIN) 100 MG capsule TAKE 1 CAPSULE BY MOUTH THREE TIMES A DAY (Patient taking differently: Take 100 mg by mouth 3 (three) times daily.) 90 capsule 0   levothyroxine (SYNTHROID) 88 MCG tablet Take 88 mcg by mouth daily before breakfast.     LINZESS 290 MCG CAPS capsule Take 290 mcg by mouth daily before breakfast.     MAGNESIUM CITRATE PO Take 250 mg by mouth daily.     midodrine (PROAMATINE) 2.5 MG tablet Take 1 tablet (2.5 mg total) by mouth every 6 (six) hours as needed (low b/p). 180 tablet 2   ondansetron (ZOFRAN-ODT) 8 MG disintegrating tablet Take 1 tablet (8 mg total) by mouth every 6 (six) hours as needed for nausea. 20 tablet  0   polyethylene glycol (MIRALAX / GLYCOLAX) 17 g packet Take 17 g by mouth daily. For bowel cleanout, mix 6-8 packets in a large Gatorade or similar drink. 30 each 0   QUEtiapine (SEROQUEL) 100 MG tablet Take 400-600 mg by mouth at bedtime.     rifaximin (XIFAXAN) 550 MG TABS tablet Take 550 mg by mouth 2 (two) times daily.     rizatriptan (MAXALT) 10 MG tablet Take 10 mg by mouth every 2 (two) hours as needed for migraine.     spironolactone (ALDACTONE) 50 MG tablet Take 50 mg by mouth in the morning.  ursodiol (ACTIGALL) 500 MG tablet Take 500 mg by mouth in the morning and at bedtime.     VRAYLAR 1.5 MG capsule Take 1.5 mg by mouth at bedtime.     No current facility-administered medications for this visit.   Facility-Administered Medications Ordered in Other Visits  Medication Dose Route Frequency Provider Last Rate Last Admin   thiamine (B-1) injection 100 mg  100 mg Intravenous Daily Gaynelle Adu, MD        Review of Systems  Constitutional:  Constitutional negative. HENT: HENT negative.  Eyes: Eyes negative.  Respiratory: Respiratory negative.  Cardiovascular: Cardiovascular negative.  GI: Positive for abdominal pain.  Musculoskeletal: Musculoskeletal negative.  Skin: Skin negative.  Neurological: Neurological negative. Hematologic: Hematologic/lymphatic negative.  Psychiatric: Psychiatric negative.        Objective:  Objective   Vitals:   05/18/23 0919  BP: 91/64  Pulse: 78  Resp: 20  Temp: 97.9 F (36.6 C)  SpO2: 94%  Weight: 234 lb (106.1 kg)  Height: 5\' 7"  (1.702 m)   Body mass index is 36.65 kg/m.  Physical Exam Constitutional:      Appearance: She is obese.  HENT:     Head: Normocephalic.     Nose: Nose normal.  Eyes:     Pupils: Pupils are equal, round, and reactive to light.  Cardiovascular:     Rate and Rhythm: Normal rate.     Pulses: Normal pulses.  Pulmonary:     Effort: Pulmonary effort is normal.  Abdominal:     General: Abdomen  is flat.     Palpations: Abdomen is soft.  Musculoskeletal:     Cervical back: Normal range of motion.  Skin:    General: Skin is warm.     Capillary Refill: Capillary refill takes less than 2 seconds.  Neurological:     General: No focal deficit present.     Mental Status: She is alert.  Psychiatric:        Mood and Affect: Mood normal.        Behavior: Behavior normal.        Thought Content: Thought content normal.        Judgment: Judgment normal.     Data: Duplex Findings:  +----------------------+--------+--------+------+--------+  Mesenteric           PSV cm/sEDV cm/sPlaqueComments  +----------------------+--------+--------+------+--------+  Aorta Prox               92                           +----------------------+--------+--------+------+--------+  Aorta Mid                90                           +----------------------+--------+--------+------+--------+  Aorta Distal            114                           +----------------------+--------+--------+------+--------+  Celiac Artery Origin    141                           +----------------------+--------+--------+------+--------+  Celiac Artery Proximal  151      47                   +----------------------+--------+--------+------+--------+  SMA Proximal            258      49                   +----------------------+--------+--------+------+--------+  SMA Mid                 241      36                   +----------------------+--------+--------+------+--------+  SMA Distal              193      38                   +----------------------+--------+--------+------+--------+  CHA                    163      29                   +----------------------+--------+--------+------+--------+  Splenic                138      47                   +----------------------+--------+--------+------+--------+           Summary:  Mesenteric:   Normal Superior Mesenteric artery, Celiac artery , Splenic artery and  Hepatic  artery findings.  The Celiac artery was assessed with normal respiration, deep inspiration,  and  full exhalation without evidence of hemodynamically significant stenosis  or  change in waveform.  IMA not able to be visualized due to bowel gas.      Assessment/Plan:    14 female with abdominal pain and with history of paraesophageal repair gastric bypass with CT evidence of median arcuate ligament syndrome.  This was not corroborated on today's mesenteric duplex with inspiration and expiration.  As such I have suggested she should look for other causes of abdominal pain outside of median arcuate ligament syndrome and she is to follow-up with her GI doctor next month.  She can see me on an as-needed basis.     Maeola Harman MD [facility]

## 2023-06-22 ENCOUNTER — Telehealth: Payer: Self-pay | Admitting: Neurology

## 2023-06-22 NOTE — Telephone Encounter (Signed)
Called patient and informed her that she will

## 2023-06-22 NOTE — Telephone Encounter (Signed)
Patient left message stating that she is having involuntary movements in her hands and legs, patient wants to make an appointment to come in. I will call patient back to schedule if need be

## 2023-06-22 NOTE — Telephone Encounter (Signed)
Called patietn and ifnroemd her that she will need an

## 2023-06-22 NOTE — Telephone Encounter (Signed)
She will need an office visit. OK to add on 9/4 + wait list.

## 2023-06-22 NOTE — Telephone Encounter (Signed)
Called patient and informed her of Dr. Eliane Decree advice that she will need a f/u and that we can add her to 07/06/23. Patient is aware that someone from the front will give her a call to get her scheduled

## 2023-07-06 ENCOUNTER — Encounter: Payer: Self-pay | Admitting: Neurology

## 2023-07-06 ENCOUNTER — Other Ambulatory Visit: Payer: Medicare HMO

## 2023-07-06 ENCOUNTER — Ambulatory Visit: Payer: Medicare HMO | Admitting: Neurology

## 2023-07-06 ENCOUNTER — Telehealth: Payer: Self-pay

## 2023-07-06 VITALS — BP 109/74 | HR 96 | Ht 67.0 in | Wt 241.0 lb

## 2023-07-06 DIAGNOSIS — R4189 Other symptoms and signs involving cognitive functions and awareness: Secondary | ICD-10-CM | POA: Diagnosis not present

## 2023-07-06 DIAGNOSIS — E639 Nutritional deficiency, unspecified: Secondary | ICD-10-CM

## 2023-07-06 DIAGNOSIS — R413 Other amnesia: Secondary | ICD-10-CM

## 2023-07-06 LAB — VITAMIN B12: Vitamin B-12: 756 pg/mL (ref 211–911)

## 2023-07-06 LAB — MAGNESIUM: Magnesium: 1.9 mg/dL (ref 1.5–2.5)

## 2023-07-06 LAB — COMPREHENSIVE METABOLIC PANEL
ALT: 33 U/L (ref 0–35)
AST: 30 U/L (ref 0–37)
Albumin: 4 g/dL (ref 3.5–5.2)
Alkaline Phosphatase: 258 U/L — ABNORMAL HIGH (ref 39–117)
BUN: 19 mg/dL (ref 6–23)
CO2: 25 meq/L (ref 19–32)
Calcium: 8.9 mg/dL (ref 8.4–10.5)
Chloride: 110 meq/L (ref 96–112)
Creatinine, Ser: 0.81 mg/dL (ref 0.40–1.20)
GFR: 82.85 mL/min (ref >60.00)
Glucose, Bld: 80 mg/dL (ref 70–99)
Potassium: 4.5 meq/L (ref 3.5–5.1)
Sodium: 140 meq/L (ref 135–145)
Total Bilirubin: 0.4 mg/dL (ref 0.2–1.2)
Total Protein: 6.1 g/dL (ref 6.0–8.3)

## 2023-07-06 LAB — TSH: TSH: 1.58 u[IU]/mL (ref 0.35–5.50)

## 2023-07-06 NOTE — Telephone Encounter (Signed)
Yes, we can keep it for now.  If her symptoms get better by then, she can always call and reschedule.

## 2023-07-06 NOTE — Telephone Encounter (Signed)
Called patient and informed her that she may keep her appointment that is scheduled in December. If her symptoms get better by then, she can always call and reschedule.   Patient verbalized understanding and had no further questions or concerns.

## 2023-07-06 NOTE — Progress Notes (Signed)
Follow-up Visit   Date: 07/06/23   Jordan Russell MRN: [id number] DOB: 23-Mar-1971   Interim History: Jordan Russell is a 52 y.o. right-handed Caucasian female with hypothyroidism, GERD, hypertension, bipolar depression, hyperparathyroidism, fibromyalgia, rheumatoid arthritis, irritable bowel syndrome, history of gastric bypass with malnutrition and copper deficiency returning to the clinic for follow-up of involuntary muscle twitches and muscle cramps.  She is followed here for right peroneal neuropathy, falls and memory loss.  The patient was accompanied to the clinic by self.     History of present illness: In August 2019, she was hospitalized for sepsis, hepatic encephalopathy, and drug-induced liver injury (plaquenil/eternacept) from 8/4 - 06/09/2018 at [facility] and transferred to [facility] where she stayed 8/9 - 06/23/2018.  Hospitalization was notable for healthcare associated pneumonia, E.coli sepsis, delirium, and liver injury.  She was transferred to rehab facility and was noted to have severe generalized weakness, diagnosed with critical illness myopathy.  She completed PT and slowly improved where she was able to stand and able to walk with a walker.  During this time, she noticed that her right foot was weak, specifically, difficulty raising her right foot.  She admits to crossing her legs.  She had NCS/EMG of the right leg at [facility] which showed axonal neuropathy and overlapping right peroneal neuropathy.  She has been using a walker since Spring 2019.  She underwent gastric bypass surgery in 2001 and lost 150lb.  She admits to having very poor appetite and continues to eat only one meal/daily.  She is active and spends 60-min on her stationary bike daily.  She has fear of gaining weight.    She has previously been evaluated evaluated at [facility] neurolog[facility] by Dr. Canary Brim for leg weakness and cognitive complaints in 2019.  MRI brain dated 03/09/2018 showed mild  generalized volume loss, no focal findings and was recommended to undergo neuropsychological testing.   UPDATE 07/30/2021:  She was last seen in 2020 for right peroneal mononeuropathy and multiple nutritional deficiencies.  She was lost to follow-up and presents today with weakness and tingling of the hands and legs, muscle cramps, and memory loss.   Starting March 2022, she began having weakness and tingling in both legs and hands.  She also feels burning and itching deep inside her legs.  She also complains of painful muscle spasms and cramps of the hands and feet. Previously labs indicated multiple nutrient deficiency including vitamin B12, vitamin B1, and copper.   She has not been taking supplements for quite some time. She still has poor nutrition and does not eat regular meals.   She is also having problems with her memory.  She walks into rooms and is confused why she is there.  She is unable to read books anymore, because she is unable to recall the plot.  She gets confused and constant repeating herself.  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  She lives at home with her husband and son.  UPDATE 12/03/2022:  She was last seen here in 07/2021.  Today, she presents with worsening right leg weakness and numbness/tingling.  She is falling 1-2 times per month because her foot feels unsteady and tends to drag.  Most of the falls have occurred when she is not using a cane.  She has history or right peroneal neuropathy and has completed PT several times over the years with no improvement.  She denies numbness/tingling in the left foot or hands.  She continues to have problems with memory, often repeating herself.  Prior neuropsychological testing from 10/2021 shows cognitive impairment due to medical and psychiatric comorbidities, no evidence of dementia.  Labs including vitamin B12, vitamin B1, and copper are much improved and normal.   UPDATE 04/04/2023:  She is here for follow-up visit.   She has noticed improved pain since starting Cymbalta 30mg /d.  She is also using a brace which prevents falls.  Overall, she feels that she is doing much better.  No new complaints.   UPDATE 07/06/2023:  She is here for follow-up visit with new complaints of involuntary muscle movements.  This started around May.  She has spells where her painful cramps of the hands and feet.  She also has episodic jerks of the hands and legs.  It occurs almost daily.  She was prescribed gabapentin 100mg  TID for hip pain by orthopaedics in April.  She is followed by GI for drug-induced liver injury and takes rifaximin. She has not had any recent labs checked.   Medications:  Current Outpatient Medications on File Prior to Visit  Medication Sig Dispense Refill   ALPRAZolam (XANAX) 1 MG tablet Take 1 mg by mouth 3 (three) times daily as needed for anxiety.     Carboxymeth-Glyc-Polysorb PF (REFRESH OPTIVE MEGA-3) 0.5-1-0.5 % SOLN Place 1 drop into both eyes 3 (three) times daily as needed (dry/irritated eyes.).     dicyclomine (BENTYL) 20 MG tablet Take 1 tablet (20 mg total) by mouth 3 (three) times daily before meals. (Patient taking differently: Take 20 mg by mouth 3 (three) times daily as needed for spasms.) 21 tablet 0   DULoxetine (CYMBALTA) 30 MG capsule Take 1 capsule (30 mg total) by mouth 2 (two) times daily. 180 capsule 3   esomeprazole (NEXIUM) 40 MG capsule Take 40 mg by mouth 2 (two) times daily.     furosemide (LASIX) 20 MG tablet Take 20 mg by mouth in the morning.     gabapentin (NEURONTIN) 100 MG capsule TAKE 1 CAPSULE BY MOUTH THREE TIMES A DAY (Patient taking differently: Take 100 mg by mouth 3 (three) times daily.) 90 capsule 0   levothyroxine (SYNTHROID) 88 MCG tablet Take 88 mcg by mouth daily before breakfast.     LINZESS 290 MCG CAPS capsule Take 290 mcg by mouth daily before breakfast.     MAGNESIUM CITRATE PO Take 250 mg by mouth daily.     midodrine (PROAMATINE) 2.5 MG tablet Take 1 tablet  (2.5 mg total) by mouth every 6 (six) hours as needed (low b/p). 180 tablet 2   ondansetron (ZOFRAN-ODT) 8 MG disintegrating tablet Take 1 tablet (8 mg total) by mouth every 6 (six) hours as needed for nausea. 20 tablet 0   polyethylene glycol (MIRALAX / GLYCOLAX) 17 g packet Take 17 g by mouth daily. For bowel cleanout, mix 6-8 packets in a large Gatorade or similar drink. 30 each 0   QUEtiapine (SEROQUEL) 100 MG tablet Take 400-600 mg by mouth at bedtime.     rifaximin (XIFAXAN) 550 MG TABS tablet Take 550 mg by mouth 2 (two) times daily.     rizatriptan (MAXALT) 10 MG tablet Take 10 mg by mouth every 2 (two) hours as needed for migraine.     spironolactone (ALDACTONE) 50 MG tablet Take 50 mg by mouth in the morning.     ursodiol (ACTIGALL) 500 MG tablet Take 500 mg by mouth in the morning and at bedtime.     VRAYLAR 1.5  MG capsule Take 1.5 mg by mouth at bedtime.     Current Facility-Administered Medications on File Prior to Visit  Medication Dose Route Frequency Provider Last Rate Last Admin   thiamine (B-1) injection 100 mg  100 mg Intravenous Daily Gaynelle Adu, MD        Allergies:  Allergies  Allergen Reactions   Sulfa Antibiotics Hives and Other (See Comments)    Other reaction(s): hives   Sulfasalazine Hives   Lamictal [Lamotrigine] Nausea And Vomiting and Other (See Comments)    Tremors, diplopia   Penicillins Hives, Itching and Other (See Comments)    As a child, "breathing, itching problem" Has patient had a PCN reaction causing immediate rash, facial/tongue/throat swelling, SOB or lightheadedness with hypotension: Yes Has patient had a PCN reaction causing severe rash involving mucus membranes or skin necrosis: Yes Has patient had a PCN reaction that required hospitalization No Has patient had a PCN reaction occurring within the last 10 years: No If all of the above answers are "NO", then may proceed with Cephalosporin use.    Tylenol [Acetaminophen]     Due to liver  failure    Morphine And Codeine Itching and Rash   Sulfur Itching    Vital Signs:  BP 109/74   Pulse 96   Ht 5\' 7"  (1.702 m)   Wt 241 lb (109.3 kg)   SpO2 97%   BMI 37.75 kg/m     Neurological Exam: MENTAL STATUS including orientation to time, place, person, recent and remote memory, attention span and concentration, language, and fund of knowledge is normal.  Speech is not dysarthric.  CRANIAL NERVES:    Normal conjugate, extra-ocular eye movements in all directions of gaze.  No ptosis.  Face is symmetric.  MOTOR:  No atrophy or fasciculations.  No myoclonus or asterixis on exam.  She does have mild bilateral postural hand tremor bilaterally.  No pronator drift.   Upper Extremity:  Right  Left  Deltoid  5/5   5/5   Biceps  5/5   5/5   Triceps  5/5   5/5   Wrist extensors  5/5   5/5   Wrist flexors  5/5   5/5   Finger extensors  5/5   5/5   Finger flexors  5/5   5/5   Dorsal interossei  5/5   5/5   Abductor pollicis  5/5   5/5   Tone (Ashworth scale)  0  0   Lower Extremity:  Right  Left  Hip flexors  5-/5   5/5   Knee flexors  5/5   5/5   Knee extensors  5/5   5/5   Dorsiflexors  2/5   5/5   Plantarflexors  5/5   5/5   Inversion 4/5  5/5  Eversion 3/5  5/5  Toe extensors  2/5   5/5   Toe flexors  5/5   5/5   Tone (Ashworth scale)  0  0   MSRs:  Reflexes are 2+/4 throughout, 1+/4 at the knees and absent at the ankle bilaterally.    SENSORY:  Absent vibration at the right ankle, reduced pin prick, temperature and light touch over the right lateral lower leg.   COORDINATION/GAIT:   Gait is mildly wide-based, assisted with AFO, stable.  Data: EMG bilateral lower extremities 09/14/2018 performed at [facility]: This is a technically limited study due to the patient's right lower leg edema and active participation on exam.  This is an abnormal  study with electrodiagnostic evidence of the following 1.  Severe sensory and motor axonal demyelinating  polyneuropathy with chronic denervation 2.  Possible superimposed right peroneal neuropathy versus technical issues related to testing.  The absent right peroneal motor and sensory responses may be secondary to lower extremity edema and inability to properly record potentials.   Component     Latest Ref Rng & Units 12/11/2018  Zinc     60 - 130 mcg/dL 70  Copper     70 - 696 mcg/dL 24 (L)  Folate     >2.9 ng/mL 19.4  Vitamin B1 (Thiamine)     8 - 30 nmol/L 6 (L)  Vitamin B12     211 - 911 pg/mL 297   MRI brain 09/06/2022: No acute or reversible finding. No cause of the presenting symptoms is identified. Mild generalized volume loss.   Neuropsychiatric testing 10/13/2021: Overall, the most likely culprit for ongoing subjective dysfunction is a combination of the numerous medical and psychiatric factors described above.   IMPRESSION/PLAN: Myoclonic jerks, possible medication-induced.  This is a potential side effect of gabapentin and given the lack of benefit for pain, I suggest that she stop the medication.  Muscle cramps - Check CMP, magnesium, TSH, vitamin B12  3. Chronic right peroneal mononeuropathy, mild progression.    - Continue Cymbalta 30mg  twice daily  - Continue supportive care  - Continue to use R AFO - Fall precautions discussed  History of multiple nutrient deficiency - vitamin B12, vitamin B1, and copper -  improved and stable.  Return to clinic in 3 months  Thank you for allowing me to participate in patient's care.  If I can answer any additional questions, I would be pleased to do so.    Sincerely,    Dorris Vangorder K. Allena Katz, DO

## 2023-07-06 NOTE — Telephone Encounter (Signed)
Patient is wanting to know if she should keep her December appointment?

## 2023-07-06 NOTE — Patient Instructions (Signed)
Stop gabapentin  Check labs

## 2023-07-07 ENCOUNTER — Telehealth: Payer: Self-pay | Admitting: Neurology

## 2023-07-07 NOTE — Telephone Encounter (Signed)
See result note. Spoke to patient and Dr. Marca Ancona is apart of [facility] and is able to see results. Patient is aware of this.

## 2023-07-07 NOTE — Telephone Encounter (Signed)
Patient called the nurse line, left message asking if her Labs could be sent to her liver specialist , Dr.Karki -[phone]

## 2023-07-19 DIAGNOSIS — H6122 Impacted cerumen, left ear: Secondary | ICD-10-CM | POA: Diagnosis not present

## 2023-07-19 DIAGNOSIS — N760 Acute vaginitis: Secondary | ICD-10-CM | POA: Diagnosis not present

## 2023-07-29 DIAGNOSIS — Z0100 Encounter for examination of eyes and vision without abnormal findings: Secondary | ICD-10-CM | POA: Diagnosis not present

## 2023-07-29 DIAGNOSIS — H524 Presbyopia: Secondary | ICD-10-CM | POA: Diagnosis not present

## 2023-07-29 DIAGNOSIS — K719 Toxic liver disease, unspecified: Secondary | ICD-10-CM | POA: Diagnosis not present

## 2023-08-05 ENCOUNTER — Other Ambulatory Visit: Payer: Self-pay | Admitting: Internal Medicine

## 2023-08-05 ENCOUNTER — Other Ambulatory Visit: Payer: Self-pay | Admitting: Physician Assistant

## 2023-08-05 DIAGNOSIS — R229 Localized swelling, mass and lump, unspecified: Secondary | ICD-10-CM | POA: Diagnosis not present

## 2023-08-05 DIAGNOSIS — R300 Dysuria: Secondary | ICD-10-CM | POA: Diagnosis not present

## 2023-08-09 ENCOUNTER — Other Ambulatory Visit: Payer: Medicare HMO

## 2023-08-10 ENCOUNTER — Ambulatory Visit
Admission: RE | Admit: 2023-08-10 | Discharge: 2023-08-10 | Disposition: A | Discharge status: Home / Self Care | Payer: Medicare HMO | Source: Ambulatory Visit | Attending: Physician Assistant | Admitting: Physician Assistant

## 2023-08-10 DIAGNOSIS — K449 Diaphragmatic hernia without obstruction or gangrene: Secondary | ICD-10-CM | POA: Diagnosis not present

## 2023-08-10 DIAGNOSIS — K219 Gastro-esophageal reflux disease without esophagitis: Secondary | ICD-10-CM | POA: Diagnosis not present

## 2023-08-10 DIAGNOSIS — R300 Dysuria: Secondary | ICD-10-CM

## 2023-09-06 ENCOUNTER — Ambulatory Visit: Payer: Medicare HMO | Admitting: Neurology

## 2023-10-17 DIAGNOSIS — K5904 Chronic idiopathic constipation: Secondary | ICD-10-CM | POA: Diagnosis not present

## 2023-10-17 DIAGNOSIS — K719 Toxic liver disease, unspecified: Secondary | ICD-10-CM | POA: Diagnosis not present

## 2023-10-17 DIAGNOSIS — K219 Gastro-esophageal reflux disease without esophagitis: Secondary | ICD-10-CM | POA: Diagnosis not present

## 2023-10-17 DIAGNOSIS — R140 Abdominal distension (gaseous): Secondary | ICD-10-CM | POA: Diagnosis not present

## 2023-10-24 ENCOUNTER — Ambulatory Visit: Payer: Medicare HMO | Admitting: Neurology

## 2023-10-30 ENCOUNTER — Other Ambulatory Visit: Payer: Self-pay | Admitting: Neurology

## 2023-11-01 DIAGNOSIS — J0191 Acute recurrent sinusitis, unspecified: Secondary | ICD-10-CM | POA: Diagnosis not present

## 2023-11-22 ENCOUNTER — Encounter: Payer: Self-pay | Admitting: Neurology

## 2023-11-22 ENCOUNTER — Ambulatory Visit: Payer: Medicare HMO | Admitting: Neurology

## 2023-11-22 DIAGNOSIS — J0191 Acute recurrent sinusitis, unspecified: Secondary | ICD-10-CM | POA: Diagnosis not present

## 2023-11-24 DIAGNOSIS — J0140 Acute pansinusitis, unspecified: Secondary | ICD-10-CM | POA: Diagnosis not present

## 2023-12-06 DIAGNOSIS — Z111 Encounter for screening for respiratory tuberculosis: Secondary | ICD-10-CM | POA: Diagnosis not present

## 2023-12-06 DIAGNOSIS — M25561 Pain in right knee: Secondary | ICD-10-CM | POA: Diagnosis not present

## 2023-12-14 ENCOUNTER — Other Ambulatory Visit: Payer: Self-pay | Admitting: Gastroenterology

## 2023-12-14 DIAGNOSIS — K769 Liver disease, unspecified: Secondary | ICD-10-CM

## 2023-12-16 ENCOUNTER — Encounter: Payer: Self-pay | Admitting: Gastroenterology

## 2023-12-29 DIAGNOSIS — Z9889 Other specified postprocedural states: Secondary | ICD-10-CM | POA: Diagnosis not present

## 2023-12-29 DIAGNOSIS — K219 Gastro-esophageal reflux disease without esophagitis: Secondary | ICD-10-CM | POA: Diagnosis not present

## 2023-12-29 DIAGNOSIS — R1310 Dysphagia, unspecified: Secondary | ICD-10-CM | POA: Diagnosis not present

## 2023-12-29 DIAGNOSIS — K5909 Other constipation: Secondary | ICD-10-CM | POA: Diagnosis not present

## 2023-12-29 DIAGNOSIS — Z8719 Personal history of other diseases of the digestive system: Secondary | ICD-10-CM | POA: Diagnosis not present

## 2023-12-29 DIAGNOSIS — Z9884 Bariatric surgery status: Secondary | ICD-10-CM | POA: Diagnosis not present

## 2023-12-29 DIAGNOSIS — E66812 Obesity, class 2: Secondary | ICD-10-CM | POA: Diagnosis not present

## 2024-01-02 ENCOUNTER — Ambulatory Visit
Admission: RE | Admit: 2024-01-02 | Discharge: 2024-01-02 | Disposition: A | Discharge status: Home / Self Care | Payer: Medicare HMO | Source: Ambulatory Visit | Attending: Gastroenterology

## 2024-01-02 DIAGNOSIS — K7689 Other specified diseases of liver: Secondary | ICD-10-CM | POA: Diagnosis not present

## 2024-01-02 DIAGNOSIS — K760 Fatty (change of) liver, not elsewhere classified: Secondary | ICD-10-CM | POA: Diagnosis not present

## 2024-01-02 DIAGNOSIS — K769 Liver disease, unspecified: Secondary | ICD-10-CM

## 2024-01-02 DIAGNOSIS — K828 Other specified diseases of gallbladder: Secondary | ICD-10-CM | POA: Diagnosis not present

## 2024-01-02 MED ORDER — GADOPICLENOL 0.5 MMOL/ML IV SOLN
10.0000 mL | Freq: Once | INTRAVENOUS | Status: AC | PRN
  Administered 2024-01-02: 10 mL via INTRAVENOUS

## 2024-01-11 DIAGNOSIS — R519 Headache, unspecified: Secondary | ICD-10-CM | POA: Diagnosis not present

## 2024-01-11 DIAGNOSIS — J0140 Acute pansinusitis, unspecified: Secondary | ICD-10-CM | POA: Diagnosis not present

## 2024-01-11 DIAGNOSIS — Y92009 Unspecified place in unspecified non-institutional (private) residence as the place of occurrence of the external cause: Secondary | ICD-10-CM | POA: Diagnosis not present

## 2024-01-11 DIAGNOSIS — W19XXXA Unspecified fall, initial encounter: Secondary | ICD-10-CM | POA: Diagnosis not present

## 2024-01-11 DIAGNOSIS — S0990XA Unspecified injury of head, initial encounter: Secondary | ICD-10-CM | POA: Diagnosis not present

## 2024-01-23 DIAGNOSIS — M79604 Pain in right leg: Secondary | ICD-10-CM | POA: Diagnosis not present

## 2024-01-30 ENCOUNTER — Ambulatory Visit: Admitting: Physician Assistant

## 2024-01-30 ENCOUNTER — Other Ambulatory Visit (INDEPENDENT_AMBULATORY_CARE_PROVIDER_SITE_OTHER): Payer: Self-pay

## 2024-01-30 ENCOUNTER — Encounter: Payer: Self-pay | Admitting: Physician Assistant

## 2024-01-30 DIAGNOSIS — M25551 Pain in right hip: Secondary | ICD-10-CM | POA: Diagnosis not present

## 2024-01-30 MED ORDER — OXYCODONE HCL 5 MG PO CAPS: 5.0000 mg | ORAL_CAPSULE | Freq: Four times a day (QID) | ORAL | 0 refills | Status: DC | PRN

## 2024-01-30 NOTE — Progress Notes (Signed)
 Office Visit Note   Patient: Jordan Russell           Date of Birth: 03/08/1971           MRN: [id number] Visit Date: 01/30/2024              Requested by: Thana Ates, MD [address] [place],  [place] [address] PCP: Thana Ates, MD   Assessment & Plan: Visit Diagnoses:  1. Pain in right hip     Plan: Jordan Russell is a pleasant 53 year old woman who comes in today with a chief complaint of 1 week of severe right groin pain.  She said this happened after she sustained a fall.  She denies any paresthesias or back pain.  The pain is focally in her groin.  She did have a little bruising felt that she felt a pop.  She cannot move her legs side-to-side she has significant difficulties bearing weight and is using a walker.  Unfortunately she cannot take ibuprofen or other anti-inflammatories because she has had gastric bypass surgery, she cannot take Tylenol because she has a history of liver failure not alcohol related.  She is clearly barely walking and she is actually gotten worse.  She is got some osteoporosis no definite fracture but I would like to get a CT scan to ensure this.  If she does not have a fracture would refer her to physical therapy and with close.  I did call her in some oxycodone plain which she should take sparingly.  Unfortunately because of her liver failure and a gastric bypass she cannot take Tylenol or ibuprofen Profen but has been doing this because she has had so much pain follow-up  Follow-Up Instructions: Return in about 2 weeks (around 02/13/2024).   Orders:  Orders Placed This Encounter  Procedures   XR HIP UNILAT W OR W/O PELVIS 2-3 VIEWS RIGHT   CT HIP RIGHT WO CONTRAST   Meds ordered this encounter  Medications   oxycodone (OXY-IR) 5 MG capsule    Sig: Take 1 capsule (5 mg total) by mouth every 6 (six) hours as needed.    Dispense:  30 capsule    Refill:  0      Procedures: No procedures performed   Clinical Data: No  additional findings.   Subjective: No chief complaint on file.   HPI patient is a pleasant 53 year old woman who comes in today with a chief complaint of right hip pain.  She said she fell last Saturday.  She went to an urgent care they told her it was bruised.  She said is the pain is in the groin into the thigh she cannot move the leg side-to-side she cannot bear weight she has a history of liver failure but is taking Tylenol and ibuprofen  Review of Systems  All other systems reviewed and are negative.    Objective: Vital Signs: There were no vitals taken for this visit.  Physical Exam Constitutional:      Appearance: Normal appearance.  Pulmonary:     Effort: Pulmonary effort is normal.  Skin:    General: Skin is warm and dry.  Neurological:     General: No focal deficit present.     Mental Status: She is alert and oriented to person, place, and time.  Psychiatric:        Mood and Affect: Mood normal.        Behavior: Behavior normal.     Ortho Exam  Examination of her hip she has barely bearing weight.  She has no redness no erythema she cannot hold a straight leg raise secondary to pain.  She says she does have a baseline foot drop on her right side which been there for a long time no tenderness in her spine no radicular findings she does have significant pain with manipulation of her hip which is reproduced in the groin Specialty Comments:  No specialty comments available.  Imaging: XR HIP UNILAT W OR W/O PELVIS 2-3 VIEWS RIGHT Result Date: 01/30/2024 X-rays of her right hip do not demonstrate any fracture no dislocation.  She does have some osteoporosis she does also have moderate degenerative changes of the femoral acetabular joint    PMFS History: Patient Active Problem List   Diagnosis Date Noted   Acute encephalopathy 09/05/2022   S/P laparoscopy 08/30/2022   Abnormal involuntary movement 10/01/2021   Alopecia 10/01/2021   Cholestasis 10/01/2021    Changes in skin texture 10/01/2021   Ataxia 10/01/2021   Enthesopathy of hip region 10/01/2021   Contact dermatitis 10/01/2021   Chronic rhinitis 10/01/2021   Chronic idiopathic constipation 10/01/2021   Hyperlipidemia 10/01/2021   Hyperesthesia 10/01/2021   Hemorrhoid 10/01/2021   Fatigue 10/01/2021   Eustachian tube disorder 10/01/2021   Pruritus 10/01/2021   Polyneuropathy 10/01/2021   Pharyngeal dysphagia 10/01/2021   Peripheral venous insufficiency 10/01/2021   Edema of lower extremity 10/01/2021   Insomnia 10/01/2021   Vitamin B12 deficiency (non anemic) 10/01/2021   Recurrent falls 10/01/2021   Vulvitis 10/01/2021   Atypical chest pain 10/01/2021   Major depressive disorder 09/29/2021   Chronic kidney disease 09/29/2021   Sinus bradycardia 06/29/2021   Hypotension 06/29/2021   Wears glasses 06/25/2021   Urgency of urination 06/25/2021   Self mutilating behavior 06/25/2021   PONV (postoperative nausea and vomiting) 06/25/2021   Left ureteral stone 06/25/2021   Hiatal hernia 06/25/2021   Gastroparesis 06/25/2021   Morbid obesity (HCC) 05/22/2020   Cigarette nicotine dependence 05/22/2020   Microhematuria 01/04/2019   Colitis 06/10/2018   Hypoalbuminemia 06/10/2018   HCAP (healthcare-associated pneumonia) 06/09/2018   Encephalopathy, hepatic (HCC)    E coli bacteremia    Severe malnutrition    Drug-induced liver injury 06/08/2018   Elevated LFTs 06/08/2018   Hyperammonemia 06/08/2018   Anasarca 06/08/2018   Hypoxia 06/05/2018   Hyperbilirubinemia 06/05/2018   Fatty infiltration of liver 06/05/2018   Severe sepsis 06/04/2018   Trochanteric bursitis, left hip 09/08/2017   Pain in right hip 09/08/2017   Carpal tunnel syndrome of left wrist 12/17/2016   Calculus of kidney 12/17/2016   Hypothyroidism 12/17/2016   Hydronephrosis with renal and ureteral calculus obstruction 12/17/2016   Hydradenitis 12/17/2016   HPTH (hyperparathyroidism) 12/17/2016   Nocturia  12/17/2016   Intractable migraine with aura without status migrainosus 12/17/2016   Arthralgia of multiple joints 12/17/2016   Iron deficiency anemia 12/17/2016   Varicose veins of bilateral lower extremities with pain 06/28/2016   IBS (irritable bowel syndrome) 03/10/2016   Generalized anxiety disorder 03/10/2016   GERD (gastroesophageal reflux disease) 03/10/2016   Epigastric pain 06/04/2015   Bipolar disorder, unspecified 08/03/2014   Rheumatoid arthritis 08/03/2014   Fibromyalgia 08/03/2014   History of nutritional disorder 08/03/2014   Hidradenitis 08/03/2014   Chondromalacia 02/26/2014   Bilateral knee pain 02/05/2014   Vitamin D deficiency 12/14/2013   Status post bariatric surgery 12/14/2013   Past Medical History:  Diagnosis Date   Abnormal involuntary movement 10/01/2021   Acute  liver failure without hepatic coma    AKI (acute kidney injury) 06/05/2018   Alopecia 10/01/2021   Anasarca 06/08/2018   Arthralgia of multiple joints 12/17/2016   Asthma    Ataxia 10/01/2021   Atypical chest pain 10/01/2021   Bilateral knee pain 02/05/2014   Bipolar disorder, unspecified 08/03/2014   Calculus of kidney 12/17/2016   Carpal tunnel syndrome of left wrist 12/17/2016   Cholestasis 10/01/2021   Chondromalacia 02/26/2014   Chronic idiopathic constipation 10/01/2021   Chronic kidney disease    Chronic rhinitis 10/01/2021   Colitis 06/10/2018   Contact dermatitis 10/01/2021   Corn of toe 10/01/2021   Cutaneous eruption 12/17/2016   Delirium due to multiple etiologies 06/11/2018   Drug-induced liver injury 06/08/2018   E coli bacteremia    Edema of lower extremity 10/01/2021   Elevated LFTs 06/08/2018   Encephalopathy, hepatic (HCC)    Enthesopathy of hip region 10/01/2021   Epigastric pain 06/04/2015   Eustachian tube disorder 10/01/2021   Fatigue 10/01/2021   Fatty infiltration of liver 06/05/2018   Fibromyalgia    Gastroparesis    Generalized anxiety disorder  03/10/2016   GERD (gastroesophageal reflux disease)    HCAP (healthcare-associated pneumonia) 06/09/2018   Hemorrhoid 10/01/2021   Hiatal hernia    Hidradenitis 08/03/2014   History of gastric bypass 12/14/2013   History of kidney stones    History of nutritional disorder 08/03/2014   HPTH (hyperparathyroidism) 12/17/2016   Hydradenitis 12/17/2016   Hydronephrosis with renal and ureteral calculus obstruction 12/17/2016   Hyperammonemia 06/08/2018   Hyperbilirubinemia 06/05/2018   Hyperesthesia 10/01/2021   Hyperlipidemia 10/01/2021   Hypoalbuminemia 06/10/2018   Hypotension    Hypothyroidism    Hypoxia 06/05/2018   IBS (irritable bowel syndrome)    Insomnia 10/01/2021   Intractable migraine with aura without status migrainosus 12/17/2016   Iron deficiency anemia 12/17/2016   Jaundice 06/10/2018   Left ureteral stone    Major depressive disorder    s/p  bilateral inferior parathyroidectomy 08-03-2010   Microhematuria 01/04/2019   Nocturia 12/17/2016   Pain in left hip 09/08/2017   Peripheral venous insufficiency 10/01/2021   Pharyngeal dysphagia 10/01/2021   Pneumonia    Polyneuropathy 10/01/2021   PONV (postoperative nausea and vomiting)    and claustrophobic with mask   Pruritus 10/01/2021   Recurrent falls 10/01/2021   Rheumatoid arthritis 08/03/2014   Self mutilating behavior    Severe malnutrition    Severe sepsis 06/04/2018   Sinus bradycardia 06/29/2021   Status post bariatric surgery 12/14/2013   Trochanteric bursitis, left hip 09/08/2017   Urgency of urination    Varicose veins of bilateral lower extremities with pain 06/28/2016   Vitamin B12 deficiency (non anemic) 10/01/2021   Vitamin D deficiency 12/14/2013   Vulvitis 10/01/2021   Wears glasses     Family History  Problem Relation Age of Onset   Breast cancer Mother    Anxiety disorder Brother    Depression Brother    Breast cancer Maternal Grandmother    Bipolar disorder Son    Breast cancer  Maternal Aunt     Past Surgical History:  Procedure Laterality Date   ABDOMINAL HYSTERECTOMY     BALLOON DILATION N/A 10/01/2014   Procedure: BALLOON DILATION;  Surgeon: Charolett Bumpers, MD;  Location: [facility];  Service: Endoscopy;  Laterality: N/A;   BIOPSY  03/05/2021   Procedure: BIOPSY;  Surgeon: Kerin Salen, MD;  Location: [facility];  Service: Gastroenterology;;   CYSTO/  BILATERAL  RETROGRADE PYELOGRAM  11/20/2000   CYSTO/  RIGHT RETROGRADE PYELOGRAM/  RIGHT URETEROSCOPY/  STENT PLACEMENT  12/16/2006   CYSTOSCOPY/URETEROSCOPY/HOLMIUM LASER/STENT PLACEMENT Left 11/09/2016   Procedure: CYSTOSCOPY/URETEROSCOPY/HOLMIUM LASER/STENT PLACEMENT;  Surgeon: Hildred Laser, MD;  Location: [facility];  Service: Urology;  Laterality: Left;  90 MINS  [phone]    ESOPHAGEAL MANOMETRY N/A 12/23/2014   Procedure: ESOPHAGEAL MANOMETRY (EM);  Surgeon: Charolett Bumpers, MD;  Location: [facility];  Service: Endoscopy;  Laterality: N/A;   ESOPHAGOGASTRODUODENOSCOPY  last one 03-28-2015   ESOPHAGOGASTRODUODENOSCOPY (EGD) WITH PROPOFOL N/A 10/01/2014   Procedure: ESOPHAGOGASTRODUODENOSCOPY (EGD) WITH PROPOFOL;  Surgeon: Charolett Bumpers, MD;  Location: [facility];  Service: Endoscopy;  Laterality: N/A;   ESOPHAGOGASTRODUODENOSCOPY (EGD) WITH PROPOFOL N/A 03/05/2021   Procedure: ESOPHAGOGASTRODUODENOSCOPY (EGD) WITH PROPOFOL;  Surgeon: Kerin Salen, MD;  Location: [facility];  Service: Gastroenterology;  Laterality: N/A;   EXTRACORPOREAL SHOCK WAVE LITHOTRIPSY  multiple times since age 7   HOLMIUM LASER APPLICATION Left 11/09/2016   Procedure: HOLMIUM LASER APPLICATION;  Surgeon: Hildred Laser, MD;  Location: [facility];  Service: Urology;  Laterality: Left;   KNEE ARTHROSCOPY Right 03-04-2014  [facility]   w/ Arthrotomy   LAPAROSCOPIC ASSISTED VAGINAL HYSTERECTOMY  12/28/2005   LAPAROSCOPIC CHOLECYSTECTOMY  01/17/2004   LAPAROSCOPY N/A 08/30/2022   Procedure:  LAPAROSCOPY DIAGNOSTIC, LAPAROSCOPIC RESECTION OF CANDY CANE LIMB;  Surgeon: Gaynelle Adu, MD;  Location: [facility];  Service: General;  Laterality: N/A;   LAPAROSCOPY LEFT OVARIAN CYSTECTOMY/  BILATERAL TUBAL LIGATION  02/26/2003   LEFT URETEROSCOPIC STONE EXTRACTION /  STENT PLACEMENT  07/09/2002   NECK EXPLORATION/  BILATERAL INFERIOR PARATHYROIDECTOMY  08/03/2010   RIGHT URETERAL DILATION/  URETEROSCOPIC STONE EXTRACTION  06/12/2010   ROUX-EN-Y GASTRIC BYPASS  2001   SOLYX TRANSURETHRAL SLING/  POSTERIOR PELVIC FLOOR SACROSPINOUS REPAIR  02/21/2009   and Cysto/  Bilateral ureteral stent placement   TONSILLECTOMY AND ADENOIDECTOMY     UPPER GI ENDOSCOPY N/A 08/10/2021   Procedure: UPPER GI ENDOSCOPY;  Surgeon: Gaynelle Adu, MD;  Location: [facility];  Service: General;  Laterality: N/A;   UPPER GI ENDOSCOPY N/A 08/30/2022   Procedure: UPPER GI ENDOSCOPY;  Surgeon: Gaynelle Adu, MD;  Location: [facility];  Service: General;  Laterality: N/A;   WRIST GANGLION EXCISION Right 01/28/2000   XI ROBOTIC ASSISTED HIATAL HERNIA REPAIR N/A 08/10/2021   Procedure: XI ROBOTIC ASSISTED HIATAL HERNIA REPAIR WITH MESH, ROBOTIC LYSIS OF ADHESIONS;  Surgeon: Gaynelle Adu, MD;  Location: [facility];  Service: General;  Laterality: N/A;   Social History   Occupational History   Occupation: disabled   Tobacco Use   Smoking status: Former    Current packs/day: 0.00    Average packs/day: 1 pack/day for 20.0 years (20.0 ttl pk-yrs)    Types: Cigarettes    Start date: 01/30/2002    Quit date: 01/30/2022    Years since quitting: 2.0   Smokeless tobacco: Never  Vaping Use   Vaping status: Every Day   Start date: 01/30/2022   Substances: Nicotine, Flavoring   Devices: Vuuse  Substance and Sexual Activity   Alcohol use: Not Currently    Comment: very rare   Drug use: No   Sexual activity: Not on file

## 2024-01-31 ENCOUNTER — Encounter: Payer: Self-pay | Admitting: Physician Assistant

## 2024-02-02 ENCOUNTER — Ambulatory Visit
Admission: RE | Admit: 2024-02-02 | Discharge: 2024-02-02 | Discharge status: Home / Self Care | Source: Ambulatory Visit | Attending: Physician Assistant

## 2024-02-02 DIAGNOSIS — R296 Repeated falls: Secondary | ICD-10-CM | POA: Diagnosis not present

## 2024-02-02 DIAGNOSIS — M25551 Pain in right hip: Secondary | ICD-10-CM

## 2024-02-02 DIAGNOSIS — M1611 Unilateral primary osteoarthritis, right hip: Secondary | ICD-10-CM | POA: Diagnosis not present

## 2024-02-08 ENCOUNTER — Ambulatory Visit: Admitting: Orthopaedic Surgery

## 2024-02-10 ENCOUNTER — Other Ambulatory Visit: Payer: Self-pay | Admitting: Physician Assistant

## 2024-02-10 ENCOUNTER — Other Ambulatory Visit: Payer: Self-pay | Admitting: Neurology

## 2024-02-10 MED ORDER — OXYCODONE HCL 5 MG PO CAPS: 5.0000 mg | ORAL_CAPSULE | Freq: Four times a day (QID) | ORAL | 0 refills | Status: DC | PRN

## 2024-02-13 ENCOUNTER — Other Ambulatory Visit: Payer: Self-pay

## 2024-02-13 ENCOUNTER — Encounter: Payer: Self-pay | Admitting: Orthopaedic Surgery

## 2024-02-13 ENCOUNTER — Ambulatory Visit: Admitting: Orthopaedic Surgery

## 2024-02-13 VITALS — Ht 67.0 in | Wt 256.0 lb

## 2024-02-13 DIAGNOSIS — M25551 Pain in right hip: Secondary | ICD-10-CM

## 2024-02-13 DIAGNOSIS — M7061 Trochanteric bursitis, right hip: Secondary | ICD-10-CM

## 2024-02-13 NOTE — Progress Notes (Signed)
 The patient is a 53 year old who comes in today to go over CT scan of her right hip.  She had an acute fall about 3 weeks ago landing hard on that right hip.  She is now only able to ambulate with a walker.  Due to significant liver disease and other issues she cannot take Tylenol or other anti-inflammatories.  She is also someone who is obese.  She is ambulating slowly with a walker.  Plain films of her right hip were reviewed and were normal.  She was sent for CT scan to make sure there was no acute fracture.  I was able to review the CT scan and she does have a osteophyte off of the superior lateral edge of the acetabulum but no evidence of fracture.  The joint space was well-maintained.  On exam she hurts quite a bit over the lateral aspect of the right hip.  There is also a little bit of groin pain.  I did recommend a steroid injection over the lateral aspect of her right hip which she agreed to and tolerated well.  At this point I would like to send her for MRI of her right hip to assess the cartilage and to rule out a fracture or other tendon or ligamentous tear.  When she comes in to go over the MRI of her right hip she does need a weight and BMI calculation.

## 2024-02-22 ENCOUNTER — Encounter: Payer: Self-pay | Admitting: Orthopaedic Surgery

## 2024-02-23 ENCOUNTER — Other Ambulatory Visit: Payer: Self-pay | Admitting: Orthopaedic Surgery

## 2024-02-23 MED ORDER — TRAMADOL HCL 50 MG PO TABS: 50.0000 mg | ORAL_TABLET | Freq: Three times a day (TID) | ORAL | 0 refills | Status: DC | PRN

## 2024-02-26 ENCOUNTER — Ambulatory Visit
Admission: RE | Admit: 2024-02-26 | Discharge: 2024-02-26 | Disposition: A | Discharge status: Home / Self Care | Source: Ambulatory Visit | Attending: Orthopaedic Surgery | Admitting: Orthopaedic Surgery

## 2024-02-26 DIAGNOSIS — M25551 Pain in right hip: Secondary | ICD-10-CM

## 2024-02-26 DIAGNOSIS — M7601 Gluteal tendinitis, right hip: Secondary | ICD-10-CM | POA: Diagnosis not present

## 2024-02-26 DIAGNOSIS — M1611 Unilateral primary osteoarthritis, right hip: Secondary | ICD-10-CM | POA: Diagnosis not present

## 2024-02-26 DIAGNOSIS — M7061 Trochanteric bursitis, right hip: Secondary | ICD-10-CM

## 2024-02-27 ENCOUNTER — Ambulatory Visit: Admitting: Orthopaedic Surgery

## 2024-03-01 ENCOUNTER — Encounter: Payer: Self-pay | Admitting: Orthopaedic Surgery

## 2024-03-01 ENCOUNTER — Ambulatory Visit: Admitting: Orthopaedic Surgery

## 2024-03-01 DIAGNOSIS — M25551 Pain in right hip: Secondary | ICD-10-CM

## 2024-03-01 MED ORDER — OXYCODONE HCL 5 MG PO TABS: 5.0000 mg | ORAL_TABLET | Freq: Four times a day (QID) | ORAL | 0 refills | Status: DC | PRN

## 2024-03-01 NOTE — Progress Notes (Signed)
 The patient is a 53 year old female who is returning for follow-up after having a MRI of her right hip.  Over a month ago she had a hard mechanical fall and x-rays were obtained of the right hip and that showed no fracture.  She had had some chronic pain over the trochanteric area of her hip but she was having a lot more pain with weightbearing around the hip.  A CT scan was then ordered and the CT scan showed no obvious fracture.  Given the severity of her pain we sent her for MRI of her right hip and she is here for review of this today.  She is still needing to ambulate with a walker and has a significant out of pain as it relates to her right hip.  She is someone who is morbidly obese.  She does smoke.  She is not a diabetic.  She did let me know that she had menopause at a very early age and was told she had osteoporosis but she is not on any medications for osteoporosis.  The MRI is reviewed with her.  There is a stress response in the proximal femur near the insertion of the iliopsoas tendon just below the lesser trochanteric area.  The cortex of the bone looks intact looking at the MRI as well as a CT scan.  She does have chronic tendinosis of the gluteus minimus and medius tendons and there is muscle atrophy.  These are chronically inflamed.  Given the stress response something in her femur I feel that this is more of a stress fracture.  I want her to keep limiting her weightbearing and taking stress off her hip using only a walker.  I would like to send her to [facility] to [facility] clinic but I also need to see the patient back in 2 weeks with an AP and lateral of her right proximal femur.  We need to see the tip of the greater trochanter down to the mid femur area.  This can be done supine and does not need to be done standing at all.  It just needs to be the proximal femur not her pelvis.  I will send in some hydrocodone  for pain.  She will be careful with any type of high impact activities  in the interim.

## 2024-03-02 DIAGNOSIS — E559 Vitamin D deficiency, unspecified: Secondary | ICD-10-CM | POA: Diagnosis not present

## 2024-03-02 DIAGNOSIS — E785 Hyperlipidemia, unspecified: Secondary | ICD-10-CM | POA: Diagnosis not present

## 2024-03-02 DIAGNOSIS — K719 Toxic liver disease, unspecified: Secondary | ICD-10-CM | POA: Diagnosis not present

## 2024-03-02 DIAGNOSIS — E039 Hypothyroidism, unspecified: Secondary | ICD-10-CM | POA: Diagnosis not present

## 2024-03-05 ENCOUNTER — Ambulatory Visit (INDEPENDENT_AMBULATORY_CARE_PROVIDER_SITE_OTHER): Admitting: Physician Assistant

## 2024-03-05 ENCOUNTER — Other Ambulatory Visit: Payer: Self-pay | Admitting: Physician Assistant

## 2024-03-05 ENCOUNTER — Encounter: Payer: Self-pay | Admitting: Physician Assistant

## 2024-03-05 DIAGNOSIS — M810 Age-related osteoporosis without current pathological fracture: Secondary | ICD-10-CM | POA: Diagnosis not present

## 2024-03-05 DIAGNOSIS — M8000XA Age-related osteoporosis with current pathological fracture, unspecified site, initial encounter for fracture: Secondary | ICD-10-CM | POA: Insufficient documentation

## 2024-03-05 NOTE — Progress Notes (Signed)
 Office Visit Note   Patient: Jordan Russell           Date of Birth: April 30, 1971           MRN: [id number] Visit Date: 03/05/2024              Requested by: [name], MD [address] [place],  [place] [address] PCP: [name], MD   Assessment & Plan: Visit Diagnoses:  1. Age-related osteoporosis without current pathological fracture   2. Osteoporosis with current pathological fracture, unspecified osteoporosis type, initial encounter     Plan: Patient is a pleasant 53 year old woman referred from Dr. [name] for evaluation of osteoporosis.  He is currently treating her for a stress fracture of the right hip.  She did have a mechanical fall but it was a ground-level.  She does take calcium.  She has no history of heart disease or stroke.  In the last year.  She has no history of cancer.  No history of kidney disease she has had a history of an ulcer and gastric bypass surgery.  She does have severe reflux as well.  She also has a history of liver failure.  No history of epilepsy or seizures.  She has significant risk factor and that she did undergo menopause at age 19.  She supplements with 1000 mg of calcium daily but does not take in much vitamin D she did not do hormone replacement therapy because she had a familial history of breast cancer.  She is a current smoker and uses a vape.  She is currently not doing any exercise as she is recovering from her hip injury.  She does not drink.  She does go to the dentist on a regular basis she does not think her family history of any type of fragility fracture.  Given her current fracture and her history of early menopause and smoking I have recommended a bone density scan.  Will also obtain a vitamin D.  Talked about risk factors with her as well as possible treatments.  Was over 30 minutes of evaluation both prior to and seeing her and answering her questions.  Will follow-up once the bone density is obtained.  Follow-Up  Instructions: After bone density  Orders:  Orders Placed This Encounter  Procedures   DG Bone Density   Vitamin D (25 hydroxy)   No orders of the defined types were placed in this encounter.     Procedures: No procedures performed   Clinical Data: No additional findings.   Subjective: No chief complaint on file.   HPI patient is a pleasant 53 year old woman patient of Dr. [name].  Referred for evaluation for osteoporosis.  She does have a current stress fracture of her right hip.  She also is high risk secondary to smoking and early menopause at the age of 68  Review of Systems  All other systems reviewed and are negative.    Objective: Vital Signs: There were no vitals taken for this visit.  Physical Exam Constitutional:      Appearance: Normal appearance.  Pulmonary:     Effort: Pulmonary effort is normal.  Skin:    General: Skin is warm and dry.  Neurological:     General: No focal deficit present.     Mental Status: She is alert and oriented to person, place, and time.       Specialty Comments:  No specialty comments available.  Imaging: No results found.  PMFS History: Patient Active Problem List   Diagnosis Date Noted   Age-related osteoporosis without current pathological fracture 03/05/2024   Acute encephalopathy 09/05/2022   S/P laparoscopy 08/30/2022   Abnormal involuntary movement 10/01/2021   Alopecia 10/01/2021   Cholestasis 10/01/2021   Changes in skin texture 10/01/2021   Ataxia 10/01/2021   Enthesopathy of hip region 10/01/2021   Contact dermatitis 10/01/2021   Chronic rhinitis 10/01/2021   Chronic idiopathic constipation 10/01/2021   Hyperlipidemia 10/01/2021   Hyperesthesia 10/01/2021   Hemorrhoid 10/01/2021   Fatigue 10/01/2021   Eustachian tube disorder 10/01/2021   Pruritus 10/01/2021   Polyneuropathy 10/01/2021   Pharyngeal dysphagia 10/01/2021   Peripheral venous insufficiency 10/01/2021   Edema of lower  extremity 10/01/2021   Insomnia 10/01/2021   Vitamin B12 deficiency (non anemic) 10/01/2021   Recurrent falls 10/01/2021   Vulvitis 10/01/2021   Atypical chest pain 10/01/2021   Major depressive disorder 09/29/2021   Chronic kidney disease 09/29/2021   Sinus bradycardia 06/29/2021   Hypotension 06/29/2021   Wears glasses 06/25/2021   Urgency of urination 06/25/2021   Self mutilating behavior 06/25/2021   PONV (postoperative nausea and vomiting) 06/25/2021   Left ureteral stone 06/25/2021   Hiatal hernia 06/25/2021   Gastroparesis 06/25/2021   Morbid obesity (HCC) 05/22/2020   Cigarette nicotine dependence 05/22/2020   Microhematuria 01/04/2019   Colitis 06/10/2018   Hypoalbuminemia 06/10/2018   HCAP (healthcare-associated pneumonia) 06/09/2018   Encephalopathy, hepatic (HCC)    E coli bacteremia    Severe malnutrition    Drug-induced liver injury 06/08/2018   Elevated LFTs 06/08/2018   Hyperammonemia 06/08/2018   Anasarca 06/08/2018   Hypoxia 06/05/2018   Hyperbilirubinemia 06/05/2018   Fatty infiltration of liver 06/05/2018   Severe sepsis 06/04/2018   Trochanteric bursitis, left hip 09/08/2017   Pain in right hip 09/08/2017   Carpal tunnel syndrome of left wrist 12/17/2016   Calculus of kidney 12/17/2016   Hypothyroidism 12/17/2016   Hydronephrosis with renal and ureteral calculus obstruction 12/17/2016   Hydradenitis 12/17/2016   HPTH (hyperparathyroidism) 12/17/2016   Nocturia 12/17/2016   Intractable migraine with aura without status migrainosus 12/17/2016   Arthralgia of multiple joints 12/17/2016   Iron deficiency anemia 12/17/2016   Varicose veins of bilateral lower extremities with pain 06/28/2016   IBS (irritable bowel syndrome) 03/10/2016   Generalized anxiety disorder 03/10/2016   GERD (gastroesophageal reflux disease) 03/10/2016   Epigastric pain 06/04/2015   Bipolar disorder, unspecified 08/03/2014   Rheumatoid arthritis 08/03/2014   Fibromyalgia  08/03/2014   History of nutritional disorder 08/03/2014   Hidradenitis 08/03/2014   Chondromalacia 02/26/2014   Bilateral knee pain 02/05/2014   Vitamin D deficiency 12/14/2013   Status post bariatric surgery 12/14/2013   Past Medical History:  Diagnosis Date   Abnormal involuntary movement 10/01/2021   Acute liver failure without hepatic coma    AKI (acute kidney injury) 06/05/2018   Alopecia 10/01/2021   Anasarca 06/08/2018   Arthralgia of multiple joints 12/17/2016   Asthma    Ataxia 10/01/2021   Atypical chest pain 10/01/2021   Bilateral knee pain 02/05/2014   Bipolar disorder, unspecified 08/03/2014   Calculus of kidney 12/17/2016   Carpal tunnel syndrome of left wrist 12/17/2016   Cholestasis 10/01/2021   Chondromalacia 02/26/2014   Chronic idiopathic constipation 10/01/2021   Chronic kidney disease    Chronic rhinitis 10/01/2021   Colitis 06/10/2018   Contact dermatitis 10/01/2021   Corn of toe 10/01/2021   Cutaneous eruption 12/17/2016   Delirium  due to multiple etiologies 06/11/2018   Drug-induced liver injury 06/08/2018   E coli bacteremia    Edema of lower extremity 10/01/2021   Elevated LFTs 06/08/2018   Encephalopathy, hepatic (HCC)    Enthesopathy of hip region 10/01/2021   Epigastric pain 06/04/2015   Eustachian tube disorder 10/01/2021   Fatigue 10/01/2021   Fatty infiltration of liver 06/05/2018   Fibromyalgia    Gastroparesis    Generalized anxiety disorder 03/10/2016   GERD (gastroesophageal reflux disease)    HCAP (healthcare-associated pneumonia) 06/09/2018   Hemorrhoid 10/01/2021   Hiatal hernia    Hidradenitis 08/03/2014   History of gastric bypass 12/14/2013   History of kidney stones    History of nutritional disorder 08/03/2014   HPTH (hyperparathyroidism) 12/17/2016   Hydradenitis 12/17/2016   Hydronephrosis with renal and ureteral calculus obstruction 12/17/2016   Hyperammonemia 06/08/2018   Hyperbilirubinemia 06/05/2018    Hyperesthesia 10/01/2021   Hyperlipidemia 10/01/2021   Hypoalbuminemia 06/10/2018   Hypotension    Hypothyroidism    Hypoxia 06/05/2018   IBS (irritable bowel syndrome)    Insomnia 10/01/2021   Intractable migraine with aura without status migrainosus 12/17/2016   Iron deficiency anemia 12/17/2016   Jaundice 06/10/2018   Left ureteral stone    Major depressive disorder    s/p  bilateral inferior parathyroidectomy 08-03-2010   Microhematuria 01/04/2019   Nocturia 12/17/2016   Pain in left hip 09/08/2017   Peripheral venous insufficiency 10/01/2021   Pharyngeal dysphagia 10/01/2021   Pneumonia    Polyneuropathy 10/01/2021   PONV (postoperative nausea and vomiting)    and claustrophobic with mask   Pruritus 10/01/2021   Recurrent falls 10/01/2021   Rheumatoid arthritis 08/03/2014   Self mutilating behavior    Severe malnutrition    Severe sepsis 06/04/2018   Sinus bradycardia 06/29/2021   Status post bariatric surgery 12/14/2013   Trochanteric bursitis, left hip 09/08/2017   Urgency of urination    Varicose veins of bilateral lower extremities with pain 06/28/2016   Vitamin B12 deficiency (non anemic) 10/01/2021   Vitamin D deficiency 12/14/2013   Vulvitis 10/01/2021   Wears glasses     Family History  Problem Relation Age of Onset   Breast cancer Mother    Anxiety disorder Brother    Depression Brother    Breast cancer Maternal Grandmother    Bipolar disorder Son    Breast cancer Maternal Aunt     Past Surgical History:  Procedure Laterality Date   ABDOMINAL HYSTERECTOMY     BALLOON DILATION N/A 10/01/2014   Procedure: BALLOON DILATION;  Surgeon: Garrett Kallman, MD;  Location: [facility];  Service: Endoscopy;  Laterality: N/A;   BIOPSY  03/05/2021   Procedure: BIOPSY;  Surgeon: Genell Ken, MD;  Location: [facility];  Service: Gastroenterology;;   CYSTO/  BILATERAL RETROGRADE PYELOGRAM  11/20/2000   CYSTO/  RIGHT RETROGRADE PYELOGRAM/  RIGHT URETEROSCOPY/   STENT PLACEMENT  12/16/2006   CYSTOSCOPY/URETEROSCOPY/HOLMIUM LASER/STENT PLACEMENT Left 11/09/2016   Procedure: CYSTOSCOPY/URETEROSCOPY/HOLMIUM LASER/STENT PLACEMENT;  Surgeon: Bart Born, MD;  Location: [facility];  Service: Urology;  Laterality: Left;  90 MINS  [phone]    ESOPHAGEAL MANOMETRY N/A 12/23/2014   Procedure: ESOPHAGEAL MANOMETRY (EM);  Surgeon: Garrett Kallman, MD;  Location: [facility];  Service: Endoscopy;  Laterality: N/A;   ESOPHAGOGASTRODUODENOSCOPY  last one 03-28-2015   ESOPHAGOGASTRODUODENOSCOPY (EGD) WITH PROPOFOL  N/A 10/01/2014   Procedure: ESOPHAGOGASTRODUODENOSCOPY (EGD) WITH PROPOFOL ;  Surgeon: Garrett Kallman, MD;  Location: [facility];  Service: Endoscopy;  Laterality: N/A;   ESOPHAGOGASTRODUODENOSCOPY (EGD) WITH PROPOFOL  N/A 03/05/2021   Procedure: ESOPHAGOGASTRODUODENOSCOPY (EGD) WITH PROPOFOL ;  Surgeon: Genell Ken, MD;  Location: [facility];  Service: Gastroenterology;  Laterality: N/A;   EXTRACORPOREAL SHOCK WAVE LITHOTRIPSY  multiple times since age 30   HOLMIUM LASER APPLICATION Left 11/09/2016   Procedure: HOLMIUM LASER APPLICATION;  Surgeon: Bart Born, MD;  Location: [facility];  Service: Urology;  Laterality: Left;   KNEE ARTHROSCOPY Right 03-04-2014  [facility]   w/ Arthrotomy   LAPAROSCOPIC ASSISTED VAGINAL HYSTERECTOMY  12/28/2005   LAPAROSCOPIC CHOLECYSTECTOMY  01/17/2004   LAPAROSCOPY N/A 08/30/2022   Procedure: LAPAROSCOPY DIAGNOSTIC, LAPAROSCOPIC RESECTION OF CANDY CANE LIMB;  Surgeon: Aldean Hummingbird, MD;  Location: [facility];  Service: General;  Laterality: N/A;   LAPAROSCOPY LEFT OVARIAN CYSTECTOMY/  BILATERAL TUBAL LIGATION  02/26/2003   LEFT URETEROSCOPIC STONE EXTRACTION /  STENT PLACEMENT  07/09/2002   NECK EXPLORATION/  BILATERAL INFERIOR PARATHYROIDECTOMY  08/03/2010   RIGHT URETERAL DILATION/  URETEROSCOPIC STONE EXTRACTION  06/12/2010   ROUX-EN-Y GASTRIC BYPASS  2001   SOLYX TRANSURETHRAL  SLING/  POSTERIOR PELVIC FLOOR SACROSPINOUS REPAIR  02/21/2009   and Cysto/  Bilateral ureteral stent placement   TONSILLECTOMY AND ADENOIDECTOMY     UPPER GI ENDOSCOPY N/A 08/10/2021   Procedure: UPPER GI ENDOSCOPY;  Surgeon: Aldean Hummingbird, MD;  Location: [facility];  Service: General;  Laterality: N/A;   UPPER GI ENDOSCOPY N/A 08/30/2022   Procedure: UPPER GI ENDOSCOPY;  Surgeon: Aldean Hummingbird, MD;  Location: [facility];  Service: General;  Laterality: N/A;   WRIST GANGLION EXCISION Right 01/28/2000   XI ROBOTIC ASSISTED HIATAL HERNIA REPAIR N/A 08/10/2021   Procedure: XI ROBOTIC ASSISTED HIATAL HERNIA REPAIR WITH MESH, ROBOTIC LYSIS OF ADHESIONS;  Surgeon: Aldean Hummingbird, MD;  Location: [facility];  Service: General;  Laterality: N/A;   Social History   Occupational History   Occupation: disabled   Tobacco Use   Smoking status: Former    Current packs/day: 0.00    Average packs/day: 1 pack/day for 20.0 years (20.0 ttl pk-yrs)    Types: Cigarettes    Start date: 01/30/2002    Quit date: 01/30/2022    Years since quitting: 2.0   Smokeless tobacco: Never  Vaping Use   Vaping status: Every Day   Start date: 01/30/2022   Substances: Nicotine, Flavoring   Devices: Vuuse  Substance and Sexual Activity   Alcohol  use: Not Currently    Comment: very rare   Drug use: No   Sexual activity: Not on file

## 2024-03-08 LAB — EXTRA SPECIMEN

## 2024-03-08 LAB — HOUSE ACCOUNT TRACKING

## 2024-03-08 LAB — VITAMIN D 25 HYDROXY (VIT D DEFICIENCY, FRACTURES): Vit D, 25-Hydroxy: 53 ng/mL (ref 30–100)

## 2024-03-14 ENCOUNTER — Encounter: Payer: Self-pay | Admitting: Orthopaedic Surgery

## 2024-03-14 ENCOUNTER — Ambulatory Visit: Admitting: Orthopaedic Surgery

## 2024-03-14 ENCOUNTER — Other Ambulatory Visit (INDEPENDENT_AMBULATORY_CARE_PROVIDER_SITE_OTHER): Payer: Self-pay

## 2024-03-14 DIAGNOSIS — M8000XA Age-related osteoporosis with current pathological fracture, unspecified site, initial encounter for fracture: Secondary | ICD-10-CM

## 2024-03-14 DIAGNOSIS — M8000XD Age-related osteoporosis with current pathological fracture, unspecified site, subsequent encounter for fracture with routine healing: Secondary | ICD-10-CM | POA: Diagnosis not present

## 2024-03-14 NOTE — Progress Notes (Signed)
 The patient comes in today because we needed to obtain repeat x-rays of her proximal femur on the right side due to a stress response near the lesser trochanter.  This was seen on MRI.  She continues to have significant pain.  2 views of the right femur are seen today and there is still no cortical irregularity of the bone around the proximal femur that is worrisome.  She still needs to go slow with his right hip and offload it using a walker.  We will see her back in a month with a repeat 2 views of her right proximal femur and hip.  This needs to be done supine.

## 2024-03-15 ENCOUNTER — Other Ambulatory Visit: Payer: Self-pay | Admitting: Orthopaedic Surgery

## 2024-03-15 MED ORDER — TRAMADOL HCL 50 MG PO TABS: 50.0000 mg | ORAL_TABLET | Freq: Four times a day (QID) | ORAL | 0 refills | Status: DC | PRN

## 2024-03-15 MED ORDER — TIZANIDINE HCL 4 MG PO TABS: 4.0000 mg | ORAL_TABLET | Freq: Two times a day (BID) | ORAL | 1 refills | Status: DC | PRN

## 2024-03-19 ENCOUNTER — Ambulatory Visit: Admitting: Orthopaedic Surgery

## 2024-03-19 ENCOUNTER — Ambulatory Visit (HOSPITAL_BASED_OUTPATIENT_CLINIC_OR_DEPARTMENT_OTHER)
Admission: RE | Admit: 2024-03-19 | Discharge: 2024-03-19 | Disposition: A | Discharge status: Home / Self Care | Source: Ambulatory Visit | Attending: Physician Assistant | Admitting: Physician Assistant

## 2024-03-19 DIAGNOSIS — M810 Age-related osteoporosis without current pathological fracture: Secondary | ICD-10-CM | POA: Insufficient documentation

## 2024-03-19 DIAGNOSIS — Z780 Asymptomatic menopausal state: Secondary | ICD-10-CM | POA: Diagnosis not present

## 2024-03-22 ENCOUNTER — Other Ambulatory Visit: Payer: Self-pay | Admitting: Orthopaedic Surgery

## 2024-03-23 ENCOUNTER — Ambulatory Visit: Admitting: Physician Assistant

## 2024-03-23 DIAGNOSIS — M8000XD Age-related osteoporosis with current pathological fracture, unspecified site, subsequent encounter for fracture with routine healing: Secondary | ICD-10-CM | POA: Diagnosis not present

## 2024-03-23 NOTE — Progress Notes (Signed)
 Office Visit Note   Patient: Jordan Russell           Date of Birth: 31-Mar-1971           MRN: [id number] Visit Date: 03/23/2024              Requested by: [name], MD [address] [place],  [place] [address] PCP: [name], MD  Chief Complaint  Patient presents with   Right Leg - Pain      HPI: Jordan Russell is a pleasant 53 year old woman who is a patient of Dr. [name].  I have also seen her in my osteoporosis clinic.  She is currently seeing Dr. [name] for a osteoporotic related stress fracture of her right hip.  She is still having significant pain.  She is due to see Dr. [name] in a few weeks.  She is looking for ideas on how she can better strengthen her leg without it hurting so much.  She does have access to a stationary bicycle at home but it does bother her quite a bit.  She tries to walk is much as she can with her walker.  She also is wondering about her osteoporosis follow-up.  Assessment & Plan: Visit Diagnoses: Right leg pain, osteoporosis  Plan: I did review with her her recent bone density scan it is -2.8 which places her in the osteoporosis category she is extremely high risk for recurrent fracture currently she she is have a osteoporotic fracture in her right femur.  Had discussed at her last visit several options.  Based on her bone density and her current fracture and her young age I am mending Evenity.  She cannot do Fosamax because she does have severe reflux.  Will follow that with Prolia we will go forward with authorization of Evenity  Follow-Up Instructions: No follow-ups on file.   Ortho Exam  Patient is alert, oriented, no adenopathy, well-dressed, normal affect, normal respiratory effort. Patient is ambulating with a walker her pain is slightly worse but no additional swelling or deformity  Imaging: No results found. No images are attached to the encounter.  Labs: Lab Results  Component Value Date   ESRSEDRATE 5  06/04/2018   REPTSTATUS 06/13/2018 FINAL 06/08/2018   CULT  06/08/2018    NO GROWTH 5 DAYS Performed at [facility], [address] [place], [place] [address]    [facility] ESCHERICHIA COLI 06/04/2018     Lab Results  Component Value Date   ALBUMIN  4.0 07/06/2023   ALBUMIN  3.6 03/25/2023   ALBUMIN  3.4 (L) 09/06/2022    Lab Results  Component Value Date   MG 1.9 07/06/2023   Lab Results  Component Value Date   VD25OH 53 03/05/2024    No results found for: "PREALBUMIN"    Latest Ref Rng & Units 03/25/2023   12:46 PM 09/06/2022    3:00 AM 09/05/2022    5:50 PM  CBC EXTENDED  WBC 4.0 - 10.5 K/uL 4.4  6.5  6.2   RBC 3.87 - 5.11 MIL/uL 4.04  4.26  3.87   Hemoglobin 12.0 - 15.0 g/dL 82.9  56.2  13.0   HCT 36.0 - 46.0 % 41.8  43.9  40.3   Platelets 150 - 400 K/uL 262  213  247   NEUT# 1.7 - 7.7 K/uL   3.7   Lymph# 0.7 - 4.0 K/uL   1.5      There is no height or weight on  file to calculate BMI.  Orders:  No orders of the defined types were placed in this encounter.  No orders of the defined types were placed in this encounter.    Procedures: No procedures performed  Clinical Data: No additional findings.  ROS:  All other systems negative, except as noted in the HPI. Review of Systems  Objective: Vital Signs: There were no vitals taken for this visit.  Specialty Comments:  No specialty comments available.  PMFS History: Patient Active Problem List   Diagnosis Date Noted   Osteoporosis with pathological fracture 03/05/2024   Acute encephalopathy 09/05/2022   S/P laparoscopy 08/30/2022   Abnormal involuntary movement 10/01/2021   Alopecia 10/01/2021   Cholestasis 10/01/2021   Changes in skin texture 10/01/2021   Ataxia 10/01/2021   Enthesopathy of hip region 10/01/2021   Contact dermatitis 10/01/2021   Chronic rhinitis 10/01/2021   Chronic idiopathic constipation 10/01/2021   Hyperlipidemia 10/01/2021   Hyperesthesia 10/01/2021   Hemorrhoid  10/01/2021   Fatigue 10/01/2021   Eustachian tube disorder 10/01/2021   Pruritus 10/01/2021   Polyneuropathy 10/01/2021   Pharyngeal dysphagia 10/01/2021   Peripheral venous insufficiency 10/01/2021   Edema of lower extremity 10/01/2021   Insomnia 10/01/2021   Vitamin B12 deficiency (non anemic) 10/01/2021   Recurrent falls 10/01/2021   Vulvitis 10/01/2021   Atypical chest pain 10/01/2021   Major depressive disorder 09/29/2021   Chronic kidney disease 09/29/2021   Sinus bradycardia 06/29/2021   Hypotension 06/29/2021   Wears glasses 06/25/2021   Urgency of urination 06/25/2021   Self mutilating behavior 06/25/2021   PONV (postoperative nausea and vomiting) 06/25/2021   Left ureteral stone 06/25/2021   Hiatal hernia 06/25/2021   Gastroparesis 06/25/2021   Morbid obesity (HCC) 05/22/2020   Cigarette nicotine dependence 05/22/2020   Microhematuria 01/04/2019   Colitis 06/10/2018   Hypoalbuminemia 06/10/2018   HCAP (healthcare-associated pneumonia) 06/09/2018   Encephalopathy, hepatic (HCC)    E coli bacteremia    Severe malnutrition    Drug-induced liver injury 06/08/2018   Elevated LFTs 06/08/2018   Hyperammonemia 06/08/2018   Anasarca 06/08/2018   Hypoxia 06/05/2018   Hyperbilirubinemia 06/05/2018   Fatty infiltration of liver 06/05/2018   Severe sepsis 06/04/2018   Trochanteric bursitis, left hip 09/08/2017   Pain in right hip 09/08/2017   Carpal tunnel syndrome of left wrist 12/17/2016   Calculus of kidney 12/17/2016   Hypothyroidism 12/17/2016   Hydronephrosis with renal and ureteral calculus obstruction 12/17/2016   Hydradenitis 12/17/2016   HPTH (hyperparathyroidism) 12/17/2016   Nocturia 12/17/2016   Intractable migraine with aura without status migrainosus 12/17/2016   Arthralgia of multiple joints 12/17/2016   Iron deficiency anemia 12/17/2016   Varicose veins of bilateral lower extremities with pain 06/28/2016   IBS (irritable bowel syndrome) 03/10/2016    Generalized anxiety disorder 03/10/2016   GERD (gastroesophageal reflux disease) 03/10/2016   Epigastric pain 06/04/2015   Bipolar disorder, unspecified 08/03/2014   Rheumatoid arthritis 08/03/2014   Fibromyalgia 08/03/2014   History of nutritional disorder 08/03/2014   Hidradenitis 08/03/2014   Chondromalacia 02/26/2014   Bilateral knee pain 02/05/2014   Vitamin D  deficiency 12/14/2013   Status post bariatric surgery 12/14/2013   Past Medical History:  Diagnosis Date   Abnormal involuntary movement 10/01/2021   Acute liver failure without hepatic coma    AKI (acute kidney injury) 06/05/2018   Alopecia 10/01/2021   Anasarca 06/08/2018   Arthralgia of multiple joints 12/17/2016   Asthma    Ataxia 10/01/2021   Atypical  chest pain 10/01/2021   Bilateral knee pain 02/05/2014   Bipolar disorder, unspecified 08/03/2014   Calculus of kidney 12/17/2016   Carpal tunnel syndrome of left wrist 12/17/2016   Cholestasis 10/01/2021   Chondromalacia 02/26/2014   Chronic idiopathic constipation 10/01/2021   Chronic kidney disease    Chronic rhinitis 10/01/2021   Colitis 06/10/2018   Contact dermatitis 10/01/2021   Corn of toe 10/01/2021   Cutaneous eruption 12/17/2016   Delirium due to multiple etiologies 06/11/2018   Drug-induced liver injury 06/08/2018   E coli bacteremia    Edema of lower extremity 10/01/2021   Elevated LFTs 06/08/2018   Encephalopathy, hepatic (HCC)    Enthesopathy of hip region 10/01/2021   Epigastric pain 06/04/2015   Eustachian tube disorder 10/01/2021   Fatigue 10/01/2021   Fatty infiltration of liver 06/05/2018   Fibromyalgia    Gastroparesis    Generalized anxiety disorder 03/10/2016   GERD (gastroesophageal reflux disease)    HCAP (healthcare-associated pneumonia) 06/09/2018   Hemorrhoid 10/01/2021   Hiatal hernia    Hidradenitis 08/03/2014   History of gastric bypass 12/14/2013   History of kidney stones    History of nutritional disorder  08/03/2014   HPTH (hyperparathyroidism) 12/17/2016   Hydradenitis 12/17/2016   Hydronephrosis with renal and ureteral calculus obstruction 12/17/2016   Hyperammonemia 06/08/2018   Hyperbilirubinemia 06/05/2018   Hyperesthesia 10/01/2021   Hyperlipidemia 10/01/2021   Hypoalbuminemia 06/10/2018   Hypotension    Hypothyroidism    Hypoxia 06/05/2018   IBS (irritable bowel syndrome)    Insomnia 10/01/2021   Intractable migraine with aura without status migrainosus 12/17/2016   Iron deficiency anemia 12/17/2016   Jaundice 06/10/2018   Left ureteral stone    Major depressive disorder    s/p  bilateral inferior parathyroidectomy 08-03-2010   Microhematuria 01/04/2019   Nocturia 12/17/2016   Pain in left hip 09/08/2017   Peripheral venous insufficiency 10/01/2021   Pharyngeal dysphagia 10/01/2021   Pneumonia    Polyneuropathy 10/01/2021   PONV (postoperative nausea and vomiting)    and claustrophobic with mask   Pruritus 10/01/2021   Recurrent falls 10/01/2021   Rheumatoid arthritis 08/03/2014   Self mutilating behavior    Severe malnutrition    Severe sepsis 06/04/2018   Sinus bradycardia 06/29/2021   Status post bariatric surgery 12/14/2013   Trochanteric bursitis, left hip 09/08/2017   Urgency of urination    Varicose veins of bilateral lower extremities with pain 06/28/2016   Vitamin B12 deficiency (non anemic) 10/01/2021   Vitamin D  deficiency 12/14/2013   Vulvitis 10/01/2021   Wears glasses     Family History  Problem Relation Age of Onset   Breast cancer Mother    Anxiety disorder Brother    Depression Brother    Breast cancer Maternal Grandmother    Bipolar disorder Son    Breast cancer Maternal Aunt     Past Surgical History:  Procedure Laterality Date   ABDOMINAL HYSTERECTOMY     BALLOON DILATION N/A 10/01/2014   Procedure: BALLOON DILATION;  Surgeon: Garrett Kallman, MD;  Location: [facility];  Service: Endoscopy;  Laterality: N/A;   BIOPSY  03/05/2021    Procedure: BIOPSY;  Surgeon: Genell Ken, MD;  Location: [facility];  Service: Gastroenterology;;   CYSTO/  BILATERAL RETROGRADE PYELOGRAM  11/20/2000   CYSTO/  RIGHT RETROGRADE PYELOGRAM/  RIGHT URETEROSCOPY/  STENT PLACEMENT  12/16/2006   CYSTOSCOPY/URETEROSCOPY/HOLMIUM LASER/STENT PLACEMENT Left 11/09/2016   Procedure: CYSTOSCOPY/URETEROSCOPY/HOLMIUM LASER/STENT PLACEMENT;  Surgeon: Bart Born, MD;  Location: [facility];  Service: Urology;  Laterality: Left;  90 MINS  [phone]    ESOPHAGEAL MANOMETRY N/A 12/23/2014   Procedure: ESOPHAGEAL MANOMETRY (EM);  Surgeon: Garrett Kallman, MD;  Location: [facility];  Service: Endoscopy;  Laterality: N/A;   ESOPHAGOGASTRODUODENOSCOPY  last one 03-28-2015   ESOPHAGOGASTRODUODENOSCOPY (EGD) WITH PROPOFOL  N/A 10/01/2014   Procedure: ESOPHAGOGASTRODUODENOSCOPY (EGD) WITH PROPOFOL ;  Surgeon: Garrett Kallman, MD;  Location: [facility];  Service: Endoscopy;  Laterality: N/A;   ESOPHAGOGASTRODUODENOSCOPY (EGD) WITH PROPOFOL  N/A 03/05/2021   Procedure: ESOPHAGOGASTRODUODENOSCOPY (EGD) WITH PROPOFOL ;  Surgeon: Genell Ken, MD;  Location: [facility];  Service: Gastroenterology;  Laterality: N/A;   EXTRACORPOREAL SHOCK WAVE LITHOTRIPSY  multiple times since age 32   HOLMIUM LASER APPLICATION Left 11/09/2016   Procedure: HOLMIUM LASER APPLICATION;  Surgeon: Bart Born, MD;  Location[facility];  Service: Urology;  Laterality: Left;   KNEE ARTHROSCOPY Right 03-04-2014  [facility]   w/ Arthrotomy   LAPAROSCOPIC ASSISTED VAGINAL HYSTERECTOMY  12/28/2005   LAPAROSCOPIC CHOLECYSTECTOMY  01/17/2004   LAPAROSCOPY N/A 08/30/2022   Procedure: LAPAROSCOPY DIAGNOSTIC, LAPAROSCOPIC RESECTION OF CANDY CANE LIMB;  Surgeon: Aldean Hummingbird, MD;  Location: [facility];  Service: General;  Laterality: N/A;   LAPAROSCOPY LEFT OVARIAN CYSTECTOMY/  BILATERAL TUBAL LIGATION  02/26/2003   LEFT URETEROSCOPIC STONE EXTRACTION /  STENT  PLACEMENT  07/09/2002   NECK EXPLORATION/  BILATERAL INFERIOR PARATHYROIDECTOMY  08/03/2010   RIGHT URETERAL DILATION/  URETEROSCOPIC STONE EXTRACTION  06/12/2010   ROUX-EN-Y GASTRIC BYPASS  2001   SOLYX TRANSURETHRAL SLING/  POSTERIOR PELVIC FLOOR SACROSPINOUS REPAIR  02/21/2009   and Cysto/  Bilateral ureteral stent placement   TONSILLECTOMY AND ADENOIDECTOMY     UPPER GI ENDOSCOPY N/A 08/10/2021   Procedure: UPPER GI ENDOSCOPY;  Surgeon: Aldean Hummingbird, MD;  Location: [facility];  Service: General;  Laterality: N/A;   UPPER GI ENDOSCOPY N/A 08/30/2022   Procedure: UPPER GI ENDOSCOPY;  Surgeon: Aldean Hummingbird, MD;  Location: [facility];  Service: General;  Laterality: N/A;   WRIST GANGLION EXCISION Right 01/28/2000   XI ROBOTIC ASSISTED HIATAL HERNIA REPAIR N/A 08/10/2021   Procedure: XI ROBOTIC ASSISTED HIATAL HERNIA REPAIR WITH MESH, ROBOTIC LYSIS OF ADHESIONS;  Surgeon: Aldean Hummingbird, MD;  Location: [facility];  Service: General;  Laterality: N/A;   Social History   Occupational History   Occupation: disabled   Tobacco Use   Smoking status: Former    Current packs/day: 0.00    Average packs/day: 1 pack/day for 20.0 years (20.0 ttl pk-yrs)    Types: Cigarettes    Start date: 01/30/2002    Quit date: 01/30/2022    Years since quitting: 2.1   Smokeless tobacco: Never  Vaping Use   Vaping status: Every Day   Start date: 01/30/2022   Substances: Nicotine, Flavoring   Devices: Vuuse  Substance and Sexual Activity   Alcohol  use: Not Currently    Comment: very rare   Drug use: No   Sexual activity: Not on file

## 2024-03-24 ENCOUNTER — Other Ambulatory Visit: Payer: Self-pay | Admitting: Orthopaedic Surgery

## 2024-03-27 DIAGNOSIS — S0990XA Unspecified injury of head, initial encounter: Secondary | ICD-10-CM | POA: Diagnosis not present

## 2024-03-27 DIAGNOSIS — H532 Diplopia: Secondary | ICD-10-CM | POA: Diagnosis not present

## 2024-03-28 ENCOUNTER — Ambulatory Visit: Admitting: Orthopaedic Surgery

## 2024-03-28 DIAGNOSIS — M7989 Other specified soft tissue disorders: Secondary | ICD-10-CM | POA: Diagnosis not present

## 2024-03-28 DIAGNOSIS — W19XXXA Unspecified fall, initial encounter: Secondary | ICD-10-CM | POA: Diagnosis not present

## 2024-03-28 DIAGNOSIS — Z8719 Personal history of other diseases of the digestive system: Secondary | ICD-10-CM | POA: Diagnosis not present

## 2024-03-28 DIAGNOSIS — S99911A Unspecified injury of right ankle, initial encounter: Secondary | ICD-10-CM | POA: Diagnosis not present

## 2024-04-01 ENCOUNTER — Encounter: Payer: Self-pay | Admitting: Orthopaedic Surgery

## 2024-04-02 NOTE — Telephone Encounter (Signed)
 Scheduled patient with MaryAnne tomorrow at 1:30pm

## 2024-04-03 ENCOUNTER — Other Ambulatory Visit (INDEPENDENT_AMBULATORY_CARE_PROVIDER_SITE_OTHER): Payer: Self-pay

## 2024-04-03 ENCOUNTER — Ambulatory Visit: Payer: Medicare HMO | Admitting: Neurology

## 2024-04-03 ENCOUNTER — Other Ambulatory Visit: Payer: Self-pay

## 2024-04-03 ENCOUNTER — Ambulatory Visit: Admitting: Physician Assistant

## 2024-04-03 ENCOUNTER — Encounter: Payer: Self-pay | Admitting: Physician Assistant

## 2024-04-03 ENCOUNTER — Other Ambulatory Visit (INDEPENDENT_AMBULATORY_CARE_PROVIDER_SITE_OTHER)

## 2024-04-03 DIAGNOSIS — M8000XD Age-related osteoporosis with current pathological fracture, unspecified site, subsequent encounter for fracture with routine healing: Secondary | ICD-10-CM | POA: Diagnosis not present

## 2024-04-03 DIAGNOSIS — S93491A Sprain of other ligament of right ankle, initial encounter: Secondary | ICD-10-CM | POA: Diagnosis not present

## 2024-04-03 DIAGNOSIS — M25572 Pain in left ankle and joints of left foot: Secondary | ICD-10-CM | POA: Diagnosis not present

## 2024-04-03 DIAGNOSIS — M25571 Pain in right ankle and joints of right foot: Secondary | ICD-10-CM | POA: Diagnosis not present

## 2024-04-03 DIAGNOSIS — S92411A Displaced fracture of proximal phalanx of right great toe, initial encounter for closed fracture: Secondary | ICD-10-CM

## 2024-04-03 DIAGNOSIS — S93621A Sprain of tarsometatarsal ligament of right foot, initial encounter: Secondary | ICD-10-CM | POA: Diagnosis not present

## 2024-04-03 NOTE — Addendum Note (Signed)
 Addended by: Georgann Kim on: 04/03/2024 03:42 PM   Modules accepted: Orders

## 2024-04-03 NOTE — Progress Notes (Signed)
 Office Visit Note   Patient: Jordan Russell           Date of Birth: [birth year]           MRN: [id number] Visit Date: 04/03/2024              Requested by: [name], MD [address] [place],  [place] [address] PCP: [name], MD   Assessment & Plan: Visit Diagnoses:  Right ankle pain Lisfranc sprain right foot  Plan: Patient comes in in a regular shoe with significant swelling in her right ankle and foot.  She is 9 days status post a folding dorsiflexion injury to her right foot.  She does have a history of a foot drop.  She was seen by 2 urgent cares she said they did not give her any indication of what was wrong.  She does have a history of osteoporosis so she is seen in my osteoporosis clinic she has a significant amount of swelling and some plantar ecchymosis.  She does not have a lot of tenderness over the lateral malleolus a little bit medially but where she is more focally tender is over the base of the first metatarsal and the Lisfranc complex.  Reviewed the case with Dr. Vaughn Georges.  Agreed we will go forward with a CAT scan of the foot stat.  If she indeed has a Lisfranc injury I will review these with Dr. Julio Ohm for now she will go into a short cam walker boot try to reduce weightbearing is much as possible I told her the importance of elevating her foot.  If she can I like her to take 1 baby aspirin a day and do some ankle pumps.  She has a palpable dorsalis pedis pulse  Follow-Up Instructions: No follow-ups on file.   Orders:  Orders Placed This Encounter  Procedures   XR Ankle Complete Left   XR Foot Complete Left   XR Ankle Complete Right   XR Foot Complete Right   No orders of the defined types were placed in this encounter.     Procedures: No procedures performed   Clinical Data: No additional findings.   Subjective: No chief complaint on file.   HPI patient is a 53 year old woman have seen before in osteoporosis clinic she is also  a patient of Dr. [name].  She is 9 days status post falling and injuring her right foot and ankle.  She has been to see to primary care doctors in urgent care she comes in without a boot or any immobilization she does say that she was told she had a fracture  Review of Systems  All other systems reviewed and are negative.    Objective: Vital Signs: There were no vitals taken for this visit.  Physical Exam Constitutional:      Appearance: Normal appearance.  Pulmonary:     Effort: Pulmonary effort is normal.  Skin:    General: Skin is warm and dry.  Neurological:     General: No focal deficit present.     Mental Status: She is alert and oriented to person, place, and time.  Psychiatric:        Mood and Affect: Mood normal.        Behavior: Behavior normal.     Ortho Exam Examination of her right foot she has moderate plus soft tissue swelling across the midfoot.  Compartments are soft and compressible she is neurovascularly intact she does have exquisite  pain to palpation over the Lisfranc complex.  She does have some plantar ecchymosis.  She has some swelling over the ankle but minimally tender over the lateral malleolus mildly tender over the medial malleolus she has brisk capillary refill Specialty Comments:  No specialty comments available.  Imaging: XR Foot Complete Right Result Date: 04/03/2024 Radiographs of the foot demonstrate significant soft tissue swelling.  She has a intra-articular base of the first metatarsal fracture cannot rule out widening of the Lisfranc complex  XR Ankle Complete Right Result Date: 04/03/2024 Three-view radiographs of the right ankle moderate soft tissue swelling.  Well-maintained alignment cannot rule out a nondisplaced fracture of the distal fibula    PMFS History: Patient Active Problem List   Diagnosis Date Noted   Lisfranc's sprain, right, initial encounter 04/03/2024   Osteoporosis with pathological fracture 03/05/2024   Acute  encephalopathy 09/05/2022   S/P laparoscopy 08/30/2022   Abnormal involuntary movement 10/01/2021   Alopecia 10/01/2021   Cholestasis 10/01/2021   Changes in skin texture 10/01/2021   Ataxia 10/01/2021   Enthesopathy of hip region 10/01/2021   Contact dermatitis 10/01/2021   Chronic rhinitis 10/01/2021   Chronic idiopathic constipation 10/01/2021   Hyperlipidemia 10/01/2021   Hyperesthesia 10/01/2021   Hemorrhoid 10/01/2021   Fatigue 10/01/2021   Eustachian tube disorder 10/01/2021   Pruritus 10/01/2021   Polyneuropathy 10/01/2021   Pharyngeal dysphagia 10/01/2021   Peripheral venous insufficiency 10/01/2021   Edema of lower extremity 10/01/2021   Insomnia 10/01/2021   Vitamin B12 deficiency (non anemic) 10/01/2021   Recurrent falls 10/01/2021   Vulvitis 10/01/2021   Atypical chest pain 10/01/2021   Major depressive disorder 09/29/2021   Chronic kidney disease 09/29/2021   Sinus bradycardia 06/29/2021   Hypotension 06/29/2021   Wears glasses 06/25/2021   Urgency of urination 06/25/2021   Self mutilating behavior 06/25/2021   PONV (postoperative nausea and vomiting) 06/25/2021   Left ureteral stone 06/25/2021   Hiatal hernia 06/25/2021   Gastroparesis 06/25/2021   Morbid obesity (HCC) 05/22/2020   Cigarette nicotine dependence 05/22/2020   Microhematuria 01/04/2019   Colitis 06/10/2018   Hypoalbuminemia 06/10/2018   HCAP (healthcare-associated pneumonia) 06/09/2018   Encephalopathy, hepatic (HCC)    E coli bacteremia    Severe malnutrition    Drug-induced liver injury 06/08/2018   Elevated LFTs 06/08/2018   Hyperammonemia 06/08/2018   Anasarca 06/08/2018   Hypoxia 06/05/2018   Hyperbilirubinemia 06/05/2018   Fatty infiltration of liver 06/05/2018   Severe sepsis 06/04/2018   Trochanteric bursitis, left hip 09/08/2017   Pain in right hip 09/08/2017   Carpal tunnel syndrome of left wrist 12/17/2016   Calculus of kidney 12/17/2016   Hypothyroidism 12/17/2016    Hydronephrosis with renal and ureteral calculus obstruction 12/17/2016   Hydradenitis 12/17/2016   HPTH (hyperparathyroidism) 12/17/2016   Nocturia 12/17/2016   Intractable migraine with aura without status migrainosus 12/17/2016   Arthralgia of multiple joints 12/17/2016   Iron deficiency anemia 12/17/2016   Varicose veins of bilateral lower extremities with pain 06/28/2016   IBS (irritable bowel syndrome) 03/10/2016   Generalized anxiety disorder 03/10/2016   GERD (gastroesophageal reflux disease) 03/10/2016   Epigastric pain 06/04/2015   Bipolar disorder, unspecified 08/03/2014   Rheumatoid arthritis 08/03/2014   Fibromyalgia 08/03/2014   History of nutritional disorder 08/03/2014   Hidradenitis 08/03/2014   Chondromalacia 02/26/2014   Bilateral knee pain 02/05/2014   Vitamin D  deficiency 12/14/2013   Status post bariatric surgery 12/14/2013   Past Medical History:  Diagnosis Date  Abnormal involuntary movement 10/01/2021   Acute liver failure without hepatic coma    AKI (acute kidney injury) 06/05/2018   Alopecia 10/01/2021   Anasarca 06/08/2018   Arthralgia of multiple joints 12/17/2016   Asthma    Ataxia 10/01/2021   Atypical chest pain 10/01/2021   Bilateral knee pain 02/05/2014   Bipolar disorder, unspecified 08/03/2014   Calculus of kidney 12/17/2016   Carpal tunnel syndrome of left wrist 12/17/2016   Cholestasis 10/01/2021   Chondromalacia 02/26/2014   Chronic idiopathic constipation 10/01/2021   Chronic kidney disease    Chronic rhinitis 10/01/2021   Colitis 06/10/2018   Contact dermatitis 10/01/2021   Corn of toe 10/01/2021   Cutaneous eruption 12/17/2016   Delirium due to multiple etiologies 06/11/2018   Drug-induced liver injury 06/08/2018   E coli bacteremia    Edema of lower extremity 10/01/2021   Elevated LFTs 06/08/2018   Encephalopathy, hepatic (HCC)    Enthesopathy of hip region 10/01/2021   Epigastric pain 06/04/2015   Eustachian tube  disorder 10/01/2021   Fatigue 10/01/2021   Fatty infiltration of liver 06/05/2018   Fibromyalgia    Gastroparesis    Generalized anxiety disorder 03/10/2016   GERD (gastroesophageal reflux disease)    HCAP (healthcare-associated pneumonia) 06/09/2018   Hemorrhoid 10/01/2021   Hiatal hernia    Hidradenitis 08/03/2014   History of gastric bypass 12/14/2013   History of kidney stones    History of nutritional disorder 08/03/2014   HPTH (hyperparathyroidism) 12/17/2016   Hydradenitis 12/17/2016   Hydronephrosis with renal and ureteral calculus obstruction 12/17/2016   Hyperammonemia 06/08/2018   Hyperbilirubinemia 06/05/2018   Hyperesthesia 10/01/2021   Hyperlipidemia 10/01/2021   Hypoalbuminemia 06/10/2018   Hypotension    Hypothyroidism    Hypoxia 06/05/2018   IBS (irritable bowel syndrome)    Insomnia 10/01/2021   Intractable migraine with aura without status migrainosus 12/17/2016   Iron deficiency anemia 12/17/2016   Jaundice 06/10/2018   Left ureteral stone    Major depressive disorder    s/p  bilateral inferior parathyroidectomy 08-03-2010   Microhematuria 01/04/2019   Nocturia 12/17/2016   Pain in left hip 09/08/2017   Peripheral venous insufficiency 10/01/2021   Pharyngeal dysphagia 10/01/2021   Pneumonia    Polyneuropathy 10/01/2021   PONV (postoperative nausea and vomiting)    and claustrophobic with mask   Pruritus 10/01/2021   Recurrent falls 10/01/2021   Rheumatoid arthritis 08/03/2014   Self mutilating behavior    Severe malnutrition    Severe sepsis 06/04/2018   Sinus bradycardia 06/29/2021   Status post bariatric surgery 12/14/2013   Trochanteric bursitis, left hip 09/08/2017   Urgency of urination    Varicose veins of bilateral lower extremities with pain 06/28/2016   Vitamin B12 deficiency (non anemic) 10/01/2021   Vitamin D  deficiency 12/14/2013   Vulvitis 10/01/2021   Wears glasses     Family History  Problem Relation Age of Onset   Breast  cancer Mother    Anxiety disorder Brother    Depression Brother    Breast cancer Maternal Grandmother    Bipolar disorder Son    Breast cancer Maternal Aunt     Past Surgical History:  Procedure Laterality Date   ABDOMINAL HYSTERECTOMY     BALLOON DILATION N/A 10/01/2014   Procedure: BALLOON DILATION;  Surgeon: Garrett Kallman, MD;  Location: [facility];  Service: Endoscopy;  Laterality: N/A;   BIOPSY  03/05/2021   Procedure: BIOPSY;  Surgeon: Genell Ken, MD;  Location: [facility];  Service: Gastroenterology;;   CYSTO/  BILATERAL RETROGRADE PYELOGRAM  11/20/2000   CYSTO/  RIGHT RETROGRADE PYELOGRAM/  RIGHT URETEROSCOPY/  STENT PLACEMENT  12/16/2006   CYSTOSCOPY/URETEROSCOPY/HOLMIUM LASER/STENT PLACEMENT Left 11/09/2016   Procedure: CYSTOSCOPY/URETEROSCOPY/HOLMIUM LASER/STENT PLACEMENT;  Surgeon: Bart Born, MD;  Location: [facility];  Service: Urology;  Laterality: Left;  90 MINS  [phone]    ESOPHAGEAL MANOMETRY N/A 12/23/2014   Procedure: ESOPHAGEAL MANOMETRY (EM);  Surgeon: Garrett Kallman, MD;  Location: [facility];  Service: Endoscopy;  Laterality: N/A;   ESOPHAGOGASTRODUODENOSCOPY  last one 03-28-2015   ESOPHAGOGASTRODUODENOSCOPY (EGD) WITH PROPOFOL  N/A 10/01/2014   Procedure: ESOPHAGOGASTRODUODENOSCOPY (EGD) WITH PROPOFOL ;  Surgeon: Garrett Kallman, MD;  Location: [facility];  Service: Endoscopy;  Laterality: N/A;   ESOPHAGOGASTRODUODENOSCOPY (EGD) WITH PROPOFOL  N/A 03/05/2021   Procedure: ESOPHAGOGASTRODUODENOSCOPY (EGD) WITH PROPOFOL ;  Surgeon: Genell Ken, MD;  Location: [facility];  Service: Gastroenterology;  Laterality: N/A;   EXTRACORPOREAL SHOCK WAVE LITHOTRIPSY  multiple times since age 51   HOLMIUM LASER APPLICATION Left 11/09/2016   Procedure: HOLMIUM LASER APPLICATION;  Surgeon: Bart Born, MD;  Location: [facility];  Service: Urology;  Laterality: Left;   KNEE ARTHROSCOPY Right 03-04-2014  [facility]   w/  Arthrotomy   LAPAROSCOPIC ASSISTED VAGINAL HYSTERECTOMY  12/28/2005   LAPAROSCOPIC CHOLECYSTECTOMY  01/17/2004   LAPAROSCOPY N/A 08/30/2022   Procedure: LAPAROSCOPY DIAGNOSTIC, LAPAROSCOPIC RESECTION OF CANDY CANE LIMB;  Surgeon: Aldean Hummingbird, MD;  Location: [facility];  Service: General;  Laterality: N/A;   LAPAROSCOPY LEFT OVARIAN CYSTECTOMY/  BILATERAL TUBAL LIGATION  02/26/2003   LEFT URETEROSCOPIC STONE EXTRACTION /  STENT PLACEMENT  07/09/2002   NECK EXPLORATION/  BILATERAL INFERIOR PARATHYROIDECTOMY  08/03/2010   RIGHT URETERAL DILATION/  URETEROSCOPIC STONE EXTRACTION  06/12/2010   ROUX-EN-Y GASTRIC BYPASS  2001   SOLYX TRANSURETHRAL SLING/  POSTERIOR PELVIC FLOOR SACROSPINOUS REPAIR  02/21/2009   and Cysto/  Bilateral ureteral stent placement   TONSILLECTOMY AND ADENOIDECTOMY     UPPER GI ENDOSCOPY N/A 08/10/2021   Procedure: UPPER GI ENDOSCOPY;  Surgeon: Aldean Hummingbird, MD;  Location: [facility];  Service: General;  Laterality: N/A;   UPPER GI ENDOSCOPY N/A 08/30/2022   Procedure: UPPER GI ENDOSCOPY;  Surgeon: Aldean Hummingbird, MD;  Location: [facility];  Service: General;  Laterality: N/A;   WRIST GANGLION EXCISION Right 01/28/2000   XI ROBOTIC ASSISTED HIATAL HERNIA REPAIR N/A 08/10/2021   Procedure: XI ROBOTIC ASSISTED HIATAL HERNIA REPAIR WITH MESH, ROBOTIC LYSIS OF ADHESIONS;  Surgeon: Aldean Hummingbird, MD;  Location: [facility];  Service: General;  Laterality: N/A;   Social History   Occupational History   Occupation: disabled   Tobacco Use   Smoking status: Former    Current packs/day: 0.00    Average packs/day: 1 pack/day for 20.0 years (20.0 ttl pk-yrs)    Types: Cigarettes    Start date: 01/30/2002    Quit date: 01/30/2022    Years since quitting: 2.1   Smokeless tobacco: Never  Vaping Use   Vaping status: Every Day   Start date: 01/30/2022   Substances: Nicotine, Flavoring   Devices: Vuuse  Substance and Sexual Activity   Alcohol  use: Not Currently    Comment: very rare   Drug use: No    Sexual activity: Not on file

## 2024-04-04 ENCOUNTER — Ambulatory Visit
Admission: RE | Admit: 2024-04-04 | Discharge: 2024-04-04 | Discharge status: Home / Self Care | Source: Ambulatory Visit | Attending: Physician Assistant

## 2024-04-04 ENCOUNTER — Encounter: Payer: Self-pay | Admitting: Physician Assistant

## 2024-04-04 DIAGNOSIS — S93621A Sprain of tarsometatarsal ligament of right foot, initial encounter: Secondary | ICD-10-CM

## 2024-04-04 DIAGNOSIS — M25572 Pain in left ankle and joints of left foot: Secondary | ICD-10-CM

## 2024-04-04 NOTE — Addendum Note (Signed)
 Addended by: Nayely Dingus W III on: 04/04/2024 08:18 AM   Modules accepted: Level of Service

## 2024-04-04 NOTE — Progress Notes (Signed)
   Patient: Jordan Russell             Date of Birth: 1971-06-08           MRN: [id number]             Visit Date: 04/03/2024  Attending Physician Addendum:   I have independently interviewed and examined the patient myself. I have discussed the above with the original author and agree with their documentation. My edits for correction/addition/clarification have been made, see any changes above and below.   In summary, pleasant 53 year-old female who presents with plantarflexion-based injury in settting of chronic foot drop with x-ray evidence of proximal metatarsal fracture of the great toe as well as concern for Lis-franc injury with significant swelling and plantar foot ecchymosis. She does have concominant osteoporosis placing her at higher risk. Surprisingly she has been WBAT, will place her into a CAM walker boot and recommend offloading with walker or knee scooter. Will obtain CT-scan of foot to evaluate for Lis-franc displacement/injury and further quantify her separate metatarsal fracture. Will have patient follow-up with Dr. Julio Ohm for further management. Once acute fracture treated, will plan for treatment with [facility].  Shauna Del, DO Primary Care Sports Medicine Physician  [facility]  Visit Diagnoses:  1. Displaced fracture of proximal phalanx of right great toe, initial encounter for closed fracture   2. Pain in left ankle and joints of left foot   3. Lisfranc's sprain, right, initial encounter   4. Osteoporosis with current pathological fracture with routine healing, subsequent encounter

## 2024-04-06 ENCOUNTER — Other Ambulatory Visit: Payer: Self-pay | Admitting: Physician Assistant

## 2024-04-06 MED ORDER — TRAMADOL HCL 50 MG PO TABS: 50.0000 mg | ORAL_TABLET | Freq: Four times a day (QID) | ORAL | 0 refills | Status: DC | PRN

## 2024-04-11 ENCOUNTER — Telehealth: Payer: Self-pay | Admitting: Physician Assistant

## 2024-04-11 NOTE — Telephone Encounter (Signed)
 Patient called returning Norma Beckers called back about making an appointment for the foot and ankle but she has an appointment already with Julio Ohm. CB#[phone]

## 2024-04-16 ENCOUNTER — Ambulatory Visit: Admitting: Physician Assistant

## 2024-04-16 ENCOUNTER — Telehealth: Payer: Self-pay

## 2024-04-18 ENCOUNTER — Ambulatory Visit: Admitting: Orthopaedic Surgery

## 2024-04-19 ENCOUNTER — Ambulatory Visit: Admitting: Physician Assistant

## 2024-04-19 ENCOUNTER — Encounter: Payer: Self-pay | Admitting: Orthopedic Surgery

## 2024-04-19 ENCOUNTER — Ambulatory Visit: Admitting: Orthopedic Surgery

## 2024-04-19 DIAGNOSIS — S92314A Nondisplaced fracture of first metatarsal bone, right foot, initial encounter for closed fracture: Secondary | ICD-10-CM

## 2024-04-24 ENCOUNTER — Telehealth: Payer: Self-pay | Admitting: Sports Medicine

## 2024-04-24 NOTE — Telephone Encounter (Signed)
 [facility] called and said that they have two prescriptions and needs the clinicals and need medication request for the medication and the provider. CB#[phone]

## 2024-04-26 NOTE — Progress Notes (Signed)
 Office Visit Note   Patient: Jordan Russell           Date of Birth: [birth year]           MRN: [id number] Visit Date: 04/19/2024              Requested by: Dwight Trula SQUIBB, MD [address] [place],  [place] [address] PCP: Dwight Trula SQUIBB, MD  Chief Complaint  Patient presents with   Right Foot - Pain      HPI: Patient is a 53 year old woman who is seen for initial evaluation for right foot injury.  CT scan was obtained June 4.  Patient states she fell at home about 3 weeks ago.  Patient states she has pain along the first metatarsal with radiating pain to her ankle.  Assessment & Plan: Visit Diagnoses:  1. Closed nondisplaced fracture of first metatarsal bone of right foot, initial encounter     Plan: Will continue with immobilization and weightbearing as tolerated.  Three-view radiographs of the right foot at follow-up.  Follow-Up Instructions: No follow-ups on file.   Ortho Exam  Patient is alert, oriented, no adenopathy, well-dressed, normal affect, normal respiratory effort. Examination patient does have a foot drop on the right she has equinus contracture of the right Achilles with dorsiflexion 20 degrees short of neutral.  She has pain with attempted dorsiflexion of the ankle.  She is tender to palpation along the first metatarsal.  Review of the CT scan shows a metaphyseal fractures of the base of the first metatarsal this is not intra-articular there is no widening of the Lisfranc joint.    Imaging: No results found. No images are attached to the encounter.  Labs: Lab Results  Component Value Date   ESRSEDRATE 5 06/04/2018   REPTSTATUS 06/13/2018 FINAL 06/08/2018   CULT  06/08/2018    NO GROWTH 5 DAYS Performed at [facility], [address], [place], [place] [address]    [facility] ESCHERICHIA COLI 06/04/2018     Lab Results  Component Value Date   ALBUMIN  4.0 07/06/2023   ALBUMIN  3.6 03/25/2023   ALBUMIN  3.4 (L) 09/06/2022     Lab Results  Component Value Date   MG 1.9 07/06/2023   Lab Results  Component Value Date   VD25OH 53 03/05/2024    No results found for: PREALBUMIN    Latest Ref Rng & Units 03/25/2023   12:46 PM 09/06/2022    3:00 AM 09/05/2022    5:50 PM  CBC EXTENDED  WBC 4.0 - 10.5 K/uL 4.4  6.5  6.2   RBC 3.87 - 5.11 MIL/uL 4.04  4.26  3.87   Hemoglobin 12.0 - 15.0 g/dL 86.2  86.0  87.1   HCT 36.0 - 46.0 % 41.8  43.9  40.3   Platelets 150 - 400 K/uL 262  213  247   NEUT# 1.7 - 7.7 K/uL   3.7   Lymph# 0.7 - 4.0 K/uL   1.5      There is no height or weight on file to calculate BMI.  Orders:  No orders of the defined types were placed in this encounter.  No orders of the defined types were placed in this encounter.    Procedures: No procedures performed  Clinical Data: No additional findings.  ROS:  All other systems negative, except as noted in the HPI. Review of Systems  Objective: Vital Signs: There were no vitals taken for this visit.  Specialty  Comments:  No specialty comments available.  PMFS History: Patient Active Problem List   Diagnosis Date Noted   Lisfranc's sprain, right, initial encounter 04/03/2024   Osteoporosis with pathological fracture 03/05/2024   Acute encephalopathy 09/05/2022   S/P laparoscopy 08/30/2022   Abnormal involuntary movement 10/01/2021   Alopecia 10/01/2021   Cholestasis 10/01/2021   Changes in skin texture 10/01/2021   Ataxia 10/01/2021   Enthesopathy of hip region 10/01/2021   Contact dermatitis 10/01/2021   Chronic rhinitis 10/01/2021   Chronic idiopathic constipation 10/01/2021   Hyperlipidemia 10/01/2021   Hyperesthesia 10/01/2021   Hemorrhoid 10/01/2021   Fatigue 10/01/2021   Eustachian tube disorder 10/01/2021   Pruritus 10/01/2021   Polyneuropathy 10/01/2021   Pharyngeal dysphagia 10/01/2021   Peripheral venous insufficiency 10/01/2021   Edema of lower extremity 10/01/2021   Insomnia 10/01/2021   Vitamin  B12 deficiency (non anemic) 10/01/2021   Recurrent falls 10/01/2021   Vulvitis 10/01/2021   Atypical chest pain 10/01/2021   Major depressive disorder 09/29/2021   Chronic kidney disease 09/29/2021   Sinus bradycardia 06/29/2021   Hypotension 06/29/2021   Wears glasses 06/25/2021   Urgency of urination 06/25/2021   Self mutilating behavior 06/25/2021   PONV (postoperative nausea and vomiting) 06/25/2021   Left ureteral stone 06/25/2021   Hiatal hernia 06/25/2021   Gastroparesis 06/25/2021   Morbid obesity (HCC) 05/22/2020   Cigarette nicotine dependence 05/22/2020   Microhematuria 01/04/2019   Colitis 06/10/2018   Hypoalbuminemia 06/10/2018   HCAP (healthcare-associated pneumonia) 06/09/2018   Encephalopathy, hepatic (HCC)    E coli bacteremia    Severe malnutrition    Drug-induced liver injury 06/08/2018   Elevated LFTs 06/08/2018   Hyperammonemia 06/08/2018   Anasarca 06/08/2018   Hypoxia 06/05/2018   Hyperbilirubinemia 06/05/2018   Fatty infiltration of liver 06/05/2018   Severe sepsis 06/04/2018   Trochanteric bursitis, left hip 09/08/2017   Pain in right hip 09/08/2017   Carpal tunnel syndrome of left wrist 12/17/2016   Calculus of kidney 12/17/2016   Hypothyroidism 12/17/2016   Hydronephrosis with renal and ureteral calculus obstruction 12/17/2016   Hydradenitis 12/17/2016   HPTH (hyperparathyroidism) 12/17/2016   Nocturia 12/17/2016   Intractable migraine with aura without status migrainosus 12/17/2016   Arthralgia of multiple joints 12/17/2016   Iron deficiency anemia 12/17/2016   Varicose veins of bilateral lower extremities with pain 06/28/2016   IBS (irritable bowel syndrome) 03/10/2016   Generalized anxiety disorder 03/10/2016   GERD (gastroesophageal reflux disease) 03/10/2016   Epigastric pain 06/04/2015   Bipolar disorder, unspecified 08/03/2014   Rheumatoid arthritis 08/03/2014   Fibromyalgia 08/03/2014   History of nutritional disorder 08/03/2014    Hidradenitis 08/03/2014   Chondromalacia 02/26/2014   Bilateral knee pain 02/05/2014   Vitamin D  deficiency 12/14/2013   Status post bariatric surgery 12/14/2013   Past Medical History:  Diagnosis Date   Abnormal involuntary movement 10/01/2021   Acute liver failure without hepatic coma    AKI (acute kidney injury) 06/05/2018   Alopecia 10/01/2021   Anasarca 06/08/2018   Arthralgia of multiple joints 12/17/2016   Asthma    Ataxia 10/01/2021   Atypical chest pain 10/01/2021   Bilateral knee pain 02/05/2014   Bipolar disorder, unspecified 08/03/2014   Calculus of kidney 12/17/2016   Carpal tunnel syndrome of left wrist 12/17/2016   Cholestasis 10/01/2021   Chondromalacia 02/26/2014   Chronic idiopathic constipation 10/01/2021   Chronic kidney disease    Chronic rhinitis 10/01/2021   Colitis 06/10/2018   Contact dermatitis 10/01/2021  Corn of toe 10/01/2021   Cutaneous eruption 12/17/2016   Delirium due to multiple etiologies 06/11/2018   Drug-induced liver injury 06/08/2018   E coli bacteremia    Edema of lower extremity 10/01/2021   Elevated LFTs 06/08/2018   Encephalopathy, hepatic (HCC)    Enthesopathy of hip region 10/01/2021   Epigastric pain 06/04/2015   Eustachian tube disorder 10/01/2021   Fatigue 10/01/2021   Fatty infiltration of liver 06/05/2018   Fibromyalgia    Gastroparesis    Generalized anxiety disorder 03/10/2016   GERD (gastroesophageal reflux disease)    HCAP (healthcare-associated pneumonia) 06/09/2018   Hemorrhoid 10/01/2021   Hiatal hernia    Hidradenitis 08/03/2014   History of gastric bypass 12/14/2013   History of kidney stones    History of nutritional disorder 08/03/2014   HPTH (hyperparathyroidism) 12/17/2016   Hydradenitis 12/17/2016   Hydronephrosis with renal and ureteral calculus obstruction 12/17/2016   Hyperammonemia 06/08/2018   Hyperbilirubinemia 06/05/2018   Hyperesthesia 10/01/2021   Hyperlipidemia 10/01/2021    Hypoalbuminemia 06/10/2018   Hypotension    Hypothyroidism    Hypoxia 06/05/2018   IBS (irritable bowel syndrome)    Insomnia 10/01/2021   Intractable migraine with aura without status migrainosus 12/17/2016   Iron deficiency anemia 12/17/2016   Jaundice 06/10/2018   Left ureteral stone    Major depressive disorder    s/p  bilateral inferior parathyroidectomy 08-03-2010   Microhematuria 01/04/2019   Nocturia 12/17/2016   Pain in left hip 09/08/2017   Peripheral venous insufficiency 10/01/2021   Pharyngeal dysphagia 10/01/2021   Pneumonia    Polyneuropathy 10/01/2021   PONV (postoperative nausea and vomiting)    and claustrophobic with mask   Pruritus 10/01/2021   Recurrent falls 10/01/2021   Rheumatoid arthritis 08/03/2014   Self mutilating behavior    Severe malnutrition    Severe sepsis 06/04/2018   Sinus bradycardia 06/29/2021   Status post bariatric surgery 12/14/2013   Trochanteric bursitis, left hip 09/08/2017   Urgency of urination    Varicose veins of bilateral lower extremities with pain 06/28/2016   Vitamin B12 deficiency (non anemic) 10/01/2021   Vitamin D  deficiency 12/14/2013   Vulvitis 10/01/2021   Wears glasses     Family History  Problem Relation Age of Onset   Breast cancer Mother    Anxiety disorder Brother    Depression Brother    Breast cancer Maternal Grandmother    Bipolar disorder Son    Breast cancer Maternal Aunt     Past Surgical History:  Procedure Laterality Date   ABDOMINAL HYSTERECTOMY     BALLOON DILATION N/A 10/01/2014   Procedure: BALLOON DILATION;  Surgeon: Gladis MARLA Louder, MD;  Location: [facility];  Service: Endoscopy;  Laterality: N/A;   BIOPSY  03/05/2021   Procedure: BIOPSY;  Surgeon: Saintclair Jasper, MD;  Location: [facility];  Service: Gastroenterology;;   CYSTO/  BILATERAL RETROGRADE PYELOGRAM  11/20/2000   CYSTO/  RIGHT RETROGRADE PYELOGRAM/  RIGHT URETEROSCOPY/  STENT PLACEMENT  12/16/2006    CYSTOSCOPY/URETEROSCOPY/HOLMIUM LASER/STENT PLACEMENT Left 11/09/2016   Procedure: CYSTOSCOPY/URETEROSCOPY/HOLMIUM LASER/STENT PLACEMENT;  Surgeon: Redell Lynwood Napoleon, MD;  Location: [facility];  Service: Urology;  Laterality: Left;  [address]  [phone]    ESOPHAGEAL MANOMETRY N/A 12/23/2014   Procedure: ESOPHAGEAL MANOMETRY (EM);  Surgeon: Gladis MARLA Louder, MD;  Location: [facility];  Service: Endoscopy;  Laterality: N/A;   ESOPHAGOGASTRODUODENOSCOPY  last one 03-28-2015   ESOPHAGOGASTRODUODENOSCOPY (EGD) WITH PROPOFOL  N/A 10/01/2014   Procedure: ESOPHAGOGASTRODUODENOSCOPY (EGD) WITH  PROPOFOL ;  Surgeon: Gladis MARLA Louder, MD;  Location: [facility];  Service: Endoscopy;  Laterality: N/A;   ESOPHAGOGASTRODUODENOSCOPY (EGD) WITH PROPOFOL  N/A 03/05/2021   Procedure: ESOPHAGOGASTRODUODENOSCOPY (EGD) WITH PROPOFOL ;  Surgeon: Saintclair Jasper, MD;  Location: [facility];  Service: Gastroenterology;  Laterality: N/A;   EXTRACORPOREAL SHOCK WAVE LITHOTRIPSY  multiple times since age 38   HOLMIUM LASER APPLICATION Left 11/09/2016   Procedure: HOLMIUM LASER APPLICATION;  Surgeon: Redell Lynwood Napoleon, MD;  Location: [facility];  Service: Urology;  Laterality: Left;   KNEE ARTHROSCOPY Right 03-04-2014  [facility]   w/ Arthrotomy   LAPAROSCOPIC ASSISTED VAGINAL HYSTERECTOMY  12/28/2005   LAPAROSCOPIC CHOLECYSTECTOMY  01/17/2004   LAPAROSCOPY N/A 08/30/2022   Procedure: LAPAROSCOPY DIAGNOSTIC, LAPAROSCOPIC RESECTION OF CANDY CANE LIMB;  Surgeon: Tanda Locus, MD;  Location: [facility];  Service: General;  Laterality: N/A;   LAPAROSCOPY LEFT OVARIAN CYSTECTOMY/  BILATERAL TUBAL LIGATION  02/26/2003   LEFT URETEROSCOPIC STONE EXTRACTION /  STENT PLACEMENT  07/09/2002   NECK EXPLORATION/  BILATERAL INFERIOR PARATHYROIDECTOMY  08/03/2010   RIGHT URETERAL DILATION/  URETEROSCOPIC STONE EXTRACTION  06/12/2010   ROUX-EN-Y GASTRIC BYPASS  2001   SOLYX TRANSURETHRAL SLING/  POSTERIOR PELVIC FLOOR  SACROSPINOUS REPAIR  02/21/2009   and Cysto/  Bilateral ureteral stent placement   TONSILLECTOMY AND ADENOIDECTOMY     UPPER GI ENDOSCOPY N/A 08/10/2021   Procedure: UPPER GI ENDOSCOPY;  Surgeon: Tanda Locus, MD;  Location: [facility];  Service: General;  Laterality: N/A;   UPPER GI ENDOSCOPY N/A 08/30/2022   Procedure: UPPER GI ENDOSCOPY;  Surgeon: Tanda Locus, MD;  Location: [facility];  Service: General;  Laterality: N/A;   WRIST GANGLION EXCISION Right 01/28/2000   XI ROBOTIC ASSISTED HIATAL HERNIA REPAIR N/A 08/10/2021   Procedure: XI ROBOTIC ASSISTED HIATAL HERNIA REPAIR WITH MESH, ROBOTIC LYSIS OF ADHESIONS;  Surgeon: Tanda Locus, MD;  Location: [facility];  Service: General;  Laterality: N/A;   Social History   Occupational History   Occupation: disabled   Tobacco Use   Smoking status: Former    Current packs/day: 0.00    Average packs/day: 1 pack/day for 20.0 years (20.0 ttl pk-yrs)    Types: Cigarettes    Start date: 01/30/2002    Quit date: 01/30/2022    Years since quitting: 2.2   Smokeless tobacco: Never  Vaping Use   Vaping status: Every Day   Start date: 01/30/2022   Substances: Nicotine, Flavoring   Devices: Vuuse  Substance and Sexual Activity   Alcohol  use: Not Currently    Comment: very rare   Drug use: No   Sexual activity: Not on file

## 2024-05-01 ENCOUNTER — Ambulatory Visit: Admitting: Neurology

## 2024-05-17 ENCOUNTER — Ambulatory Visit: Admitting: Physician Assistant

## 2024-05-17 ENCOUNTER — Other Ambulatory Visit: Payer: Self-pay

## 2024-05-17 ENCOUNTER — Other Ambulatory Visit: Payer: Self-pay | Admitting: Orthopedic Surgery

## 2024-05-17 DIAGNOSIS — S92314A Nondisplaced fracture of first metatarsal bone, right foot, initial encounter for closed fracture: Secondary | ICD-10-CM

## 2024-05-18 ENCOUNTER — Encounter: Payer: Self-pay | Admitting: Physician Assistant

## 2024-05-18 NOTE — Progress Notes (Signed)
 Office Visit Note   Patient: Jordan Russell           Date of Birth: 02-11-1971           MRN: [id number] Visit Date: 05/17/2024              Requested by: Dwight Trula SQUIBB, MD [address] [place],  [place] [address] PCP: Dwight Trula SQUIBB, MD  Chief Complaint  Patient presents with   Right Foot - Follow-up      HPI: Patient is a 53 year old woman who is seen for initial evaluation for right foot injury. CT scan was obtained June 4. Patient states she fell at home about 3 weeks ago. She sustained a closed first MT/second MT non displaced fractures.  She was placed in a CAM boot with WBAT status.  She uses a walker for balance and support.  Assessment & Plan: Visit Diagnoses:  1. Closed nondisplaced fracture of first metatarsal bone of right foot, initial encounter     Plan: She will start weaning out of the CAM boot as tolerates and into a regular shoe.  F/u in 4 weeks for repeat x ray.  Elevation for edema PRN.  Follow-Up Instructions: Return in about 4 weeks (around 06/14/2024) for repeat x ray right foot 3 views.   Ortho Exam  Patient is alert, oriented, no adenopathy, well-dressed, normal affect, normal respiratory effort. Minimal edema in the right LE.  Neutral dorsiflexion of the ankle.  NTTP at the base of the first and second MT.      Imaging: X ray shows appearence of new bone healing at the base of the first TM and second MT without separation of the Lisfranc ligament.  Labs: Lab Results  Component Value Date   ESRSEDRATE 5 06/04/2018   REPTSTATUS 06/13/2018 FINAL 06/08/2018   CULT  06/08/2018    NO GROWTH 5 DAYS Performed at [facility], [address], [place], [place] [address]    [facility] ESCHERICHIA COLI 06/04/2018     Lab Results  Component Value Date   ALBUMIN  4.0 07/06/2023   ALBUMIN  3.6 03/25/2023   ALBUMIN  3.4 (L) 09/06/2022    Lab Results  Component Value Date   MG 1.9 07/06/2023   Lab Results  Component Value  Date   VD25OH 53 03/05/2024    No results found for: PREALBUMIN    Latest Ref Rng & Units 03/25/2023   12:46 PM 09/06/2022    3:00 AM 09/05/2022    5:50 PM  CBC EXTENDED  WBC 4.0 - 10.5 K/uL 4.4  6.5  6.2   RBC 3.87 - 5.11 MIL/uL 4.04  4.26  3.87   Hemoglobin 12.0 - 15.0 g/dL 86.2  86.0  87.1   HCT 36.0 - 46.0 % 41.8  43.9  40.3   Platelets 150 - 400 K/uL 262  213  247   NEUT# 1.7 - 7.7 K/uL   3.7   Lymph# 0.7 - 4.0 K/uL   1.5      There is no height or weight on file to calculate BMI.  Orders:  No orders of the defined types were placed in this encounter.  No orders of the defined types were placed in this encounter.    Procedures: No procedures performed  Clinical Data: No additional findings.  ROS:  All other systems negative, except as noted in the HPI. Review of Systems  Objective: Vital Signs: There were no vitals taken for this visit.  Specialty Comments:  No specialty comments available.  PMFS History: Patient Active Problem List   Diagnosis Date Noted   Lisfranc's sprain, right, initial encounter 04/03/2024   Osteoporosis with pathological fracture 03/05/2024   Acute encephalopathy 09/05/2022   S/P laparoscopy 08/30/2022   Abnormal involuntary movement 10/01/2021   Alopecia 10/01/2021   Cholestasis 10/01/2021   Changes in skin texture 10/01/2021   Ataxia 10/01/2021   Enthesopathy of hip region 10/01/2021   Contact dermatitis 10/01/2021   Chronic rhinitis 10/01/2021   Chronic idiopathic constipation 10/01/2021   Hyperlipidemia 10/01/2021   Hyperesthesia 10/01/2021   Hemorrhoid 10/01/2021   Fatigue 10/01/2021   Eustachian tube disorder 10/01/2021   Pruritus 10/01/2021   Polyneuropathy 10/01/2021   Pharyngeal dysphagia 10/01/2021   Peripheral venous insufficiency 10/01/2021   Edema of lower extremity 10/01/2021   Insomnia 10/01/2021   Vitamin B12 deficiency (non anemic) 10/01/2021   Recurrent falls 10/01/2021   Vulvitis 10/01/2021    Atypical chest pain 10/01/2021   Major depressive disorder 09/29/2021   Chronic kidney disease 09/29/2021   Sinus bradycardia 06/29/2021   Hypotension 06/29/2021   Wears glasses 06/25/2021   Urgency of urination 06/25/2021   Self mutilating behavior 06/25/2021   PONV (postoperative nausea and vomiting) 06/25/2021   Left ureteral stone 06/25/2021   Hiatal hernia 06/25/2021   Gastroparesis 06/25/2021   Morbid obesity (HCC) 05/22/2020   Cigarette nicotine dependence 05/22/2020   Microhematuria 01/04/2019   Colitis 06/10/2018   Hypoalbuminemia 06/10/2018   HCAP (healthcare-associated pneumonia) 06/09/2018   Encephalopathy, hepatic (HCC)    E coli bacteremia    Severe malnutrition    Drug-induced liver injury 06/08/2018   Elevated LFTs 06/08/2018   Hyperammonemia 06/08/2018   Anasarca 06/08/2018   Hypoxia 06/05/2018   Hyperbilirubinemia 06/05/2018   Fatty infiltration of liver 06/05/2018   Severe sepsis 06/04/2018   Trochanteric bursitis, left hip 09/08/2017   Pain in right hip 09/08/2017   Carpal tunnel syndrome of left wrist 12/17/2016   Calculus of kidney 12/17/2016   Hypothyroidism 12/17/2016   Hydronephrosis with renal and ureteral calculus obstruction 12/17/2016   Hydradenitis 12/17/2016   HPTH (hyperparathyroidism) 12/17/2016   Nocturia 12/17/2016   Intractable migraine with aura without status migrainosus 12/17/2016   Arthralgia of multiple joints 12/17/2016   Iron deficiency anemia 12/17/2016   Varicose veins of bilateral lower extremities with pain 06/28/2016   IBS (irritable bowel syndrome) 03/10/2016   Generalized anxiety disorder 03/10/2016   GERD (gastroesophageal reflux disease) 03/10/2016   Epigastric pain 06/04/2015   Bipolar disorder, unspecified 08/03/2014   Rheumatoid arthritis 08/03/2014   Fibromyalgia 08/03/2014   History of nutritional disorder 08/03/2014   Hidradenitis 08/03/2014   Chondromalacia 02/26/2014   Bilateral knee pain 02/05/2014    Vitamin D  deficiency 12/14/2013   Status post bariatric surgery 12/14/2013   Past Medical History:  Diagnosis Date   Abnormal involuntary movement 10/01/2021   Acute liver failure without hepatic coma    AKI (acute kidney injury) 06/05/2018   Alopecia 10/01/2021   Anasarca 06/08/2018   Arthralgia of multiple joints 12/17/2016   Asthma    Ataxia 10/01/2021   Atypical chest pain 10/01/2021   Bilateral knee pain 02/05/2014   Bipolar disorder, unspecified 08/03/2014   Calculus of kidney 12/17/2016   Carpal tunnel syndrome of left wrist 12/17/2016   Cholestasis 10/01/2021   Chondromalacia 02/26/2014   Chronic idiopathic constipation 10/01/2021   Chronic kidney disease    Chronic rhinitis 10/01/2021   Colitis 06/10/2018   Contact dermatitis 10/01/2021  Corn of toe 10/01/2021   Cutaneous eruption 12/17/2016   Delirium due to multiple etiologies 06/11/2018   Drug-induced liver injury 06/08/2018   E coli bacteremia    Edema of lower extremity 10/01/2021   Elevated LFTs 06/08/2018   Encephalopathy, hepatic (HCC)    Enthesopathy of hip region 10/01/2021   Epigastric pain 06/04/2015   Eustachian tube disorder 10/01/2021   Fatigue 10/01/2021   Fatty infiltration of liver 06/05/2018   Fibromyalgia    Gastroparesis    Generalized anxiety disorder 03/10/2016   GERD (gastroesophageal reflux disease)    HCAP (healthcare-associated pneumonia) 06/09/2018   Hemorrhoid 10/01/2021   Hiatal hernia    Hidradenitis 08/03/2014   History of gastric bypass 12/14/2013   History of kidney stones    History of nutritional disorder 08/03/2014   HPTH (hyperparathyroidism) 12/17/2016   Hydradenitis 12/17/2016   Hydronephrosis with renal and ureteral calculus obstruction 12/17/2016   Hyperammonemia 06/08/2018   Hyperbilirubinemia 06/05/2018   Hyperesthesia 10/01/2021   Hyperlipidemia 10/01/2021   Hypoalbuminemia 06/10/2018   Hypotension    Hypothyroidism    Hypoxia 06/05/2018   IBS  (irritable bowel syndrome)    Insomnia 10/01/2021   Intractable migraine with aura without status migrainosus 12/17/2016   Iron deficiency anemia 12/17/2016   Jaundice 06/10/2018   Left ureteral stone    Major depressive disorder    s/p  bilateral inferior parathyroidectomy 08-03-2010   Microhematuria 01/04/2019   Nocturia 12/17/2016   Pain in left hip 09/08/2017   Peripheral venous insufficiency 10/01/2021   Pharyngeal dysphagia 10/01/2021   Pneumonia    Polyneuropathy 10/01/2021   PONV (postoperative nausea and vomiting)    and claustrophobic with mask   Pruritus 10/01/2021   Recurrent falls 10/01/2021   Rheumatoid arthritis 08/03/2014   Self mutilating behavior    Severe malnutrition    Severe sepsis 06/04/2018   Sinus bradycardia 06/29/2021   Status post bariatric surgery 12/14/2013   Trochanteric bursitis, left hip 09/08/2017   Urgency of urination    Varicose veins of bilateral lower extremities with pain 06/28/2016   Vitamin B12 deficiency (non anemic) 10/01/2021   Vitamin D  deficiency 12/14/2013   Vulvitis 10/01/2021   Wears glasses     Family History  Problem Relation Age of Onset   Breast cancer Mother    Anxiety disorder Brother    Depression Brother    Breast cancer Maternal Grandmother    Bipolar disorder Son    Breast cancer Maternal Aunt     Past Surgical History:  Procedure Laterality Date   ABDOMINAL HYSTERECTOMY     BALLOON DILATION N/A 10/01/2014   Procedure: BALLOON DILATION;  Surgeon: Gladis MARLA Louder, MD;  Location: [facility];  Service: Endoscopy;  Laterality: N/A;   BIOPSY  03/05/2021   Procedure: BIOPSY;  Surgeon: Saintclair Jasper, MD;  Location: [facility];  Service: Gastroenterology;;   CYSTO/  BILATERAL RETROGRADE PYELOGRAM  11/20/2000   CYSTO/  RIGHT RETROGRADE PYELOGRAM/  RIGHT URETEROSCOPY/  STENT PLACEMENT  12/16/2006   CYSTOSCOPY/URETEROSCOPY/HOLMIUM LASER/STENT PLACEMENT Left 11/09/2016   Procedure: CYSTOSCOPY/URETEROSCOPY/HOLMIUM  LASER/STENT PLACEMENT;  Surgeon: Redell Lynwood Napoleon, MD;  Location: [facility];  Service: Urology;  Laterality: Left;  90 MINS  [phone]    ESOPHAGEAL MANOMETRY N/A 12/23/2014   Procedure: ESOPHAGEAL MANOMETRY (EM);  Surgeon: Gladis MARLA Louder, MD;  Location: [facility];  Service: Endoscopy;  Laterality: N/A;   ESOPHAGOGASTRODUODENOSCOPY  last one 03-28-2015   ESOPHAGOGASTRODUODENOSCOPY (EGD) WITH PROPOFOL  N/A 10/01/2014   Procedure: ESOPHAGOGASTRODUODENOSCOPY (EGD) WITH  PROPOFOL ;  Surgeon: Gladis MARLA Louder, MD;  Location: [facility];  Service: Endoscopy;  Laterality: N/A;   ESOPHAGOGASTRODUODENOSCOPY (EGD) WITH PROPOFOL  N/A 03/05/2021   Procedure: ESOPHAGOGASTRODUODENOSCOPY (EGD) WITH PROPOFOL ;  Surgeon: Saintclair Jasper, MD;  Location: [facility];  Service: Gastroenterology;  Laterality: N/A;   EXTRACORPOREAL SHOCK WAVE LITHOTRIPSY  multiple times since age 71   HOLMIUM LASER APPLICATION Left 11/09/2016   Procedure: HOLMIUM LASER APPLICATION;  Surgeon: Redell Lynwood Napoleon, MD;  Location: [facility];  Service: Urology;  Laterality: Left;   KNEE ARTHROSCOPY Right 03-04-2014  [facility]   w/ Arthrotomy   LAPAROSCOPIC ASSISTED VAGINAL HYSTERECTOMY  12/28/2005   LAPAROSCOPIC CHOLECYSTECTOMY  01/17/2004   LAPAROSCOPY N/A 08/30/2022   Procedure: LAPAROSCOPY DIAGNOSTIC, LAPAROSCOPIC RESECTION OF CANDY CANE LIMB;  Surgeon: Tanda Locus, MD;  Location: [facility];  Service: General;  Laterality: N/A;   LAPAROSCOPY LEFT OVARIAN CYSTECTOMY/  BILATERAL TUBAL LIGATION  02/26/2003   LEFT URETEROSCOPIC STONE EXTRACTION /  STENT PLACEMENT  07/09/2002   NECK EXPLORATION/  BILATERAL INFERIOR PARATHYROIDECTOMY  08/03/2010   RIGHT URETERAL DILATION/  URETEROSCOPIC STONE EXTRACTION  06/12/2010   ROUX-EN-Y GASTRIC BYPASS  2001   SOLYX TRANSURETHRAL SLING/  POSTERIOR PELVIC FLOOR SACROSPINOUS REPAIR  02/21/2009   and Cysto/  Bilateral ureteral stent placement   TONSILLECTOMY AND  ADENOIDECTOMY     UPPER GI ENDOSCOPY N/A 08/10/2021   Procedure: UPPER GI ENDOSCOPY;  Surgeon: Tanda Locus, MD;  Location: [facility];  Service: General;  Laterality: N/A;   UPPER GI ENDOSCOPY N/A 08/30/2022   Procedure: UPPER GI ENDOSCOPY;  Surgeon: Tanda Locus, MD;  Location: [facility];  Service: General;  Laterality: N/A;   WRIST GANGLION EXCISION Right 01/28/2000   XI ROBOTIC ASSISTED HIATAL HERNIA REPAIR N/A 08/10/2021   Procedure: XI ROBOTIC ASSISTED HIATAL HERNIA REPAIR WITH MESH, ROBOTIC LYSIS OF ADHESIONS;  Surgeon: Tanda Locus, MD;  Location: [facility];  Service: General;  Laterality: N/A;   Social History   Occupational History   Occupation: disabled   Tobacco Use   Smoking status: Former    Current packs/day: 0.00    Average packs/day: 1 pack/day for 20.0 years (20.0 ttl pk-yrs)    Types: Cigarettes    Start date: 01/30/2002    Quit date: 01/30/2022    Years since quitting: 2.2   Smokeless tobacco: Never  Vaping Use   Vaping status: Every Day   Start date: 01/30/2022   Substances: Nicotine, Flavoring   Devices: Vuuse  Substance and Sexual Activity   Alcohol  use: Not Currently    Comment: very rare   Drug use: No   Sexual activity: Not on file

## 2024-06-04 ENCOUNTER — Ambulatory Visit: Admitting: Neurology

## 2024-06-04 ENCOUNTER — Telehealth: Payer: Self-pay

## 2024-06-04 ENCOUNTER — Encounter: Payer: Self-pay | Admitting: Neurology

## 2024-06-04 VITALS — BP 111/72 | HR 80 | Ht 67.0 in | Wt 268.0 lb

## 2024-06-04 DIAGNOSIS — E639 Nutritional deficiency, unspecified: Secondary | ICD-10-CM | POA: Diagnosis not present

## 2024-06-04 DIAGNOSIS — R42 Dizziness and giddiness: Secondary | ICD-10-CM

## 2024-06-04 DIAGNOSIS — G5731 Lesion of lateral popliteal nerve, right lower limb: Secondary | ICD-10-CM

## 2024-06-04 DIAGNOSIS — R413 Other amnesia: Secondary | ICD-10-CM

## 2024-06-04 DIAGNOSIS — R2681 Unsteadiness on feet: Secondary | ICD-10-CM | POA: Diagnosis not present

## 2024-06-04 NOTE — Telephone Encounter (Signed)
 Insurance underwriter

## 2024-06-04 NOTE — Patient Instructions (Signed)
 Check labs  MRI brain   Neuropsychological testing  You have been referred for a neurocognitive evaluation (i.e., evaluation of memory and thinking abilities). Please bring someone with you to this appointment if possible, as it is helpful for the neuropsychologist to hear from both you and another adult who knows you well. Please bring eyeglasses and hearing aids if you wear them and take any medications as you normally would. Please fully abstain from all alcohol , marijuana, or other substances prior to your appointment.   The evaluation will take approximately 2-3 hours and has two parts:   The first part is a clinical interview with the neuropsychologist, Dr. Richie or Dr. Gayland. During the interview, the neuropsychologist will speak with you and the individual you brought to the appointment.    The second part of the evaluation is testing with the doctor's technician, aka psychometrician, Dana or [employer]. During the testing, the technician will ask you to remember different types of material, solve problems, and answer some questionnaires. Your family member will not be present for this portion of the evaluation.   Please note: We have to reserve several hours of the neuropsychologist's time and the psychometrician's time for your evaluation appointment. As such, there is a No-Show fee of $100. If you are unable to attend any of your appointments, please contact our office as soon as possible to reschedule.

## 2024-06-04 NOTE — Progress Notes (Signed)
 Follow-up Visit   Date: 06/04/24   SABEL HORNBECK MRN: [id number] DOB: [birth year]   Interim History: MARTITA BRUMM is a 53 y.o. right-handed Caucasian female with hypothyroidism, GERD, hypertension, bipolar depression, hyperparathyroidism, fibromyalgia, rheumatoid arthritis, irritable bowel syndrome, history of gastric bypass with malnutrition and copper  deficiency returning to the clinic for follow-up of involuntary muscle twitches and muscle cramps.  She is followed here for right peroneal neuropathy, falls and memory loss.  The patient was accompanied to the clinic by self.     History of present illness: In August 2019, she was hospitalized for sepsis, hepatic encephalopathy, and drug-induced liver injury (plaquenil /eternacept) from 8/4 - 06/09/2018 at [facility] and transferred to [facility] where she stayed 8/9 - 06/23/2018.  Hospitalization was notable for healthcare associated pneumonia, E.coli sepsis, delirium, and liver injury.  She was transferred to rehab facility and was noted to have severe generalized weakness, diagnosed with critical illness myopathy.  She completed PT and slowly improved where she was able to stand and able to walk with a walker.  During this time, she noticed that her right foot was weak, specifically, difficulty raising her right foot.  She admits to crossing her legs.  She had NCS/EMG of the right leg at [facility] which showed axonal neuropathy and overlapping right peroneal neuropathy.  She has been using a walker since Spring 2019.  She underwent gastric bypass surgery in 2001 and lost 150lb.  She admits to having very poor appetite and continues to eat only one meal/daily.  She is active and spends 60-min on her stationary bike daily.  She has fear of gaining weight.    She has previously been evaluated evaluated at [facility] neurology by Dr. Garnette Pique for leg weakness and cognitive complaints in 2019.  MRI brain dated 03/09/2018 showed mild  generalized volume loss, no focal findings and was recommended to undergo neuropsychological testing.   UPDATE 07/30/2021:  She was last seen in 2020 for right peroneal mononeuropathy and multiple nutritional deficiencies.  She was lost to follow-up and presents today with weakness and tingling of the hands and legs, muscle cramps, and memory loss.   Starting March 2022, she began having weakness and tingling in both legs and hands.  She also feels burning and itching deep inside her legs.  She also complains of painful muscle spasms and cramps of the hands and feet. Previously labs indicated multiple nutrient deficiency including vitamin B12, vitamin B1, and copper .   She has not been taking supplements for quite some time. She still has poor nutrition and does not eat regular meals.   She is also having problems with her memory.  She walks into rooms and is confused why she is there.  She is unable to read books anymore, because she is unable to recall the plot.  She gets confused and constant repeating herself.  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  She lives at home with her husband and son.  UPDATE 12/03/2022:  She was last seen here in 07/2021.  Today, she presents with worsening right leg weakness and numbness/tingling.  She is falling 1-2 times per month because her foot feels unsteady and tends to drag.  Most of the falls have occurred when she is not using a cane.  She has history or right peroneal neuropathy and has completed PT several times over the years with no improvement.  She denies numbness/tingling in the left foot or hands.  She continues to have problems with memory, often repeating herself.  Prior neuropsychological testing from 10/2021 shows cognitive impairment due to medical and psychiatric comorbidities, no evidence of dementia.  Labs including vitamin B12, vitamin B1, and copper  are much improved and normal.   UPDATE 04/04/2023:  She is here for follow-up visit.   She has noticed improved pain since starting Cymbalta  30mg /d.  She is also using a brace which prevents falls.  Overall, she feels that she is doing much better.  No new complaints.   UPDATE 07/06/2023:  She is here for follow-up visit with new complaints of involuntary muscle movements.  This started around May.  She has spells where her painful cramps of the hands and feet.  She also has episodic jerks of the hands and legs.  It occurs almost daily.  She was prescribed gabapentin  100mg  TID for hip pain by orthopaedics in April.  She is followed by GI for drug-induced liver injury and takes rifaximin . She has not had any recent labs checked.   UPDATE 06/04/2024:  She is here for follow-up visit and reports that her memory is getting worse.  She is forgetting day to day tasks and details.  For instance, she cannot drive to the grocery store without using GPS and often forgets items that she needs to get.  She does not manage her money or finances.  She takes seroquel  400mg  for bipolar disorder.  A few weeks ago, she reports having a fall which resulted in right foot fracture.  She is also very dizzy and overall not feeling well.  No recent medication changes.   Medications:  Current Outpatient Medications on File Prior to Visit  Medication Sig Dispense Refill   ALPRAZolam  (XANAX ) 1 MG tablet Take 1 mg by mouth 3 (three) times daily as needed for anxiety.     dicyclomine  (BENTYL ) 20 MG tablet Take 1 tablet (20 mg total) by mouth 3 (three) times daily before meals. 21 tablet 0   DULoxetine  (CYMBALTA ) 30 MG capsule TAKE 1 CAPSULE BY MOUTH 2 TIMES DAILY. 180 capsule 3   esomeprazole (NEXIUM) 40 MG capsule Take 40 mg by mouth 2 (two) times daily.     furosemide  (LASIX ) 20 MG tablet Take 20 mg by mouth in the morning.     gabapentin  (NEURONTIN ) 100 MG capsule TAKE 1 CAPSULE BY MOUTH THREE TIMES A DAY 90 capsule 0   levothyroxine  (SYNTHROID ) 88 MCG tablet Take 88 mcg by mouth daily before breakfast.      LINZESS  290 MCG CAPS capsule Take 290 mcg by mouth daily before breakfast.     MAGNESIUM  CITRATE PO Take 250 mg by mouth daily.     midodrine  (PROAMATINE ) 2.5 MG tablet Take 1 tablet (2.5 mg total) by mouth every 6 (six) hours as needed (low b/p). 180 tablet 2   ondansetron  (ZOFRAN -ODT) 8 MG disintegrating tablet Take 1 tablet (8 mg total) by mouth every 6 (six) hours as needed for nausea. 20 tablet 0   QUEtiapine  (SEROQUEL ) 100 MG tablet Take 400-600 mg by mouth at bedtime.     rifaximin  (XIFAXAN ) 550 MG TABS tablet Take 550 mg by mouth 2 (two) times daily.     spironolactone  (ALDACTONE ) 50 MG tablet Take 50 mg by mouth in the morning.     tiZANidine  (ZANAFLEX ) 4 MG tablet TAKE 1 TABLET (4 MG TOTAL) BY MOUTH 2 (TWO) TIMES DAILY AS NEEDED FOR MUSCLE SPASMS. 180 tablet 1   ursodiol (ACTIGALL) 500 MG tablet Take 500  mg by mouth in the morning and at bedtime.     VRAYLAR  1.5 MG capsule Take 1.5 mg by mouth at bedtime.     Carboxymeth-Glyc-Polysorb PF (REFRESH OPTIVE MEGA-3) 0.5-1-0.5 % SOLN Place 1 drop into both eyes 3 (three) times daily as needed (dry/irritated eyes.).     Current Facility-Administered Medications on File Prior to Visit  Medication Dose Route Frequency Provider Last Rate Last Admin   thiamine  (B-1) injection 100 mg  100 mg Intravenous Daily Tanda Locus, MD        Allergies:  Allergies  Allergen Reactions   Sulfa Antibiotics Hives and Other (See Comments)    Other reaction(s): hives   Sulfasalazine Hives   Lamictal  [Lamotrigine ] Nausea And Vomiting and Other (See Comments)    Tremors, diplopia   Penicillins Hives, Itching and Other (See Comments)    As a child, breathing, itching problem Has patient had a PCN reaction causing immediate rash, facial/tongue/throat swelling, SOB or lightheadedness with hypotension: Yes Has patient had a PCN reaction causing severe rash involving mucus membranes or skin necrosis: Yes Has patient had a PCN reaction that required  hospitalization No Has patient had a PCN reaction occurring within the last 10 years: No If all of the above answers are NO, then may proceed with Cephalosporin use.    Tylenol  [Acetaminophen ]     Due to liver failure    Morphine And Codeine Itching and Rash   Sulfur Itching    Vital Signs:  BP 111/72 (BP Location: Right Arm, Patient Position: Sitting, Cuff Size: Large)   Pulse 80   Ht 5' 7 (1.702 m)   Wt 268 lb (121.6 kg)   SpO2 95%   BMI 41.97 kg/m       06/04/2024    3:59 PM 07/30/2021   12:06 PM  Montreal Cognitive Assessment   Visuospatial/ Executive (0/5) 4 5  Naming (0/3) 3 3  Attention: Read list of digits (0/2) 2 2  Attention: Read list of letters (0/1) 1 1  Attention: Serial 7 subtraction starting at 100 (0/3) 2 3  Language: Repeat phrase (0/2) 1 2  Language : Fluency (0/1) 1 1  Abstraction (0/2) 1 2  Delayed Recall (0/5) 1 2  Orientation (0/6) 5 5  Total 21 26  Adjusted Score (based on education) 21 26    Neurological Exam: MENTAL STATUS including orientation to time, place, person, recent and remote memory, attention span and concentration, language, and fund of knowledge is normal.  Speech is not dysarthric.  CRANIAL NERVES:    Normal conjugate, extra-ocular eye movements in all directions of gaze.  No ptosis.  There is mild right facial droop (old), smile is symmetric.  Facial sensation is intact.   MOTOR:  No atrophy or fasciculations.  No pronator drift.  Motor strength is 5/5 throughout, except right lower leg not examined as it is immobilized in a boot.   MSRs:  Reflexes are 2+/4 throughout, 1+/4 at the knees and absent at the left ankle.  SENSORY:  Intact vibration at the MCP, knees, and left ankle.    COORDINATION/GAIT:   Gait not tested, she is wearing a boot on the right leg, supported by walker.   Data: EMG bilateral lower extremities 09/14/2018 performed at [facility]: This is a technically limited study due to the patient's right  lower leg edema and active participation on exam.  This is an abnormal study with electrodiagnostic evidence of the following 1.  Severe sensory and motor axonal  demyelinating polyneuropathy with chronic denervation 2.  Possible superimposed right peroneal neuropathy versus technical issues related to testing.  The absent right peroneal motor and sensory responses may be secondary to lower extremity edema and inability to properly record potentials.   Component     Latest Ref Rng & Units 12/11/2018  Zinc      60 - 130 mcg/dL 70  Copper      70 - 175 mcg/dL 24 (L)  Folate     >4.0 ng/mL 19.4  Vitamin B1 (Thiamine )     8 - 30 nmol/L 6 (L)  Vitamin B12     211 - 911 pg/mL 297   Lab Results  Component Value Date   TSH 1.58 07/06/2023   Lab Results  Component Value Date   VITAMINB12 756 07/06/2023   Lab Results  Component Value Date   FOLATE 6.0 07/30/2021     MRI brain 09/06/2022: No acute or reversible finding. No cause of the presenting symptoms is identified. Mild generalized volume loss.   Neuropsychiatric testing 10/13/2021: Overall, the most likely culprit for ongoing subjective dysfunction is a combination of the numerous medical and psychiatric factors described above.   IMPRESSION/PLAN: Memory loss, most likely due to medication and mood disorder, which was confirmed on prior testing in 2022.  She reports this is markedly worsening, so will reassess.  Reassured her that she does not have dementia.  - MRI brain wo contrast - Neuropsychological testing  3. Chronic right peroneal mononeuropathy.  Unable to assess motor strength and sensation due to wearing a boot.   - Continue Cymbalta  30mg  twice daily  - Continue supportive care  History of multiple nutrient deficiency - vitamin B12, vitamin B1, and copper . Recheck vitamin B12, folate, vitamin B1, and copper  today.   Return to clinic in 4 months  Total time spent reviewing records, interview, history/exam,  documentation, and coordination of care on day of encounter:  40 minutes   Thank you for allowing me to participate in patient's care.  If I can answer any additional questions, I would be pleased to do so.    Sincerely,    Shealee Yordy K. Tobie, DO

## 2024-06-08 ENCOUNTER — Ambulatory Visit: Payer: Self-pay | Admitting: Neurology

## 2024-06-08 LAB — B12 AND FOLATE PANEL
Folate: 7.2 ng/mL
Vitamin B-12: 329 pg/mL (ref 200–1100)

## 2024-06-08 LAB — COPPER, SERUM: Copper: 135 ug/dL (ref 70–175)

## 2024-06-08 LAB — VITAMIN B1: Vitamin B1 (Thiamine): 14 nmol/L (ref 8–30)

## 2024-06-11 ENCOUNTER — Encounter: Payer: Self-pay | Admitting: Neurology

## 2024-06-12 ENCOUNTER — Ambulatory Visit (INDEPENDENT_AMBULATORY_CARE_PROVIDER_SITE_OTHER): Admitting: Psychology

## 2024-06-12 ENCOUNTER — Ambulatory Visit: Payer: Self-pay

## 2024-06-12 DIAGNOSIS — F3189 Other bipolar disorder: Secondary | ICD-10-CM

## 2024-06-12 DIAGNOSIS — F0670 Mild neurocognitive disorder due to known physiological condition without behavioral disturbance: Secondary | ICD-10-CM

## 2024-06-12 NOTE — Progress Notes (Addendum)
 NEUROPSYCHOLOGICAL EVALUATION [facility]  [place] Department of Neurology  Date of Evaluation: 06/12/2024  REASON FOR REFERRAL   Jordan Russell is a 53 year old, right-handed, White female with 18 years of formal education. She was referred for neuropsychological evaluation by her neurologist, Tonita Blanch, D.O., to assess current neurocognitive functioning, document potential cognitive deficits, and assist with treatment planning. She previously underwent neuropsychological evaluation at [facility] with Arthea Maryland, Ph.D., ABPP, on 10/13/2021. Results from that evaluation were used as a baseline for comparison.  SUMMARY OF RESULTS   Scores on performance validity measures were generally within expected limits, with one exception. Results should be interpreted with caution.  Premorbid cognitive abilities are estimated to be in the average range based on word reading and sociodemographic factors. Relative to this baseline estimate, performance today was variable across all domains, including attention/working memory, processing speed, executive functioning, language, visuospatial abilities, and learning/memory.  Specifically, the patient performed well on a simple auditory working memory task but had difficulty when task demands became more complex. Visual scanning speed was slow, though decoding, color naming, and word reading speed were adequate. She demonstrated low performance on measures of alternating attention and response inhibition, while performance on visual abstract reasoning and phonemic fluency tasks was within expectations. Semantic fluency was low, while confrontation naming was intact. She demonstrated difficulty with a block Chief Executive Officer task but was able to successfully copy a complex figure.   Learning/memory was also variable. She demonstrated poor encoding, recall, and recognition of a word list. In contrast, her performance was within  expectations when learning and remembering a story, shapes, and daily living instructions. The only exception was low delayed recognition of the shapes; however, her performance improved with the forced-choice recognition format.  On self-report questionnaires, she endorsed moderate symptoms of depression, anxiety, and sleep disturbance.   Compared to the previous evaluation in 2022, she demonstrated relative improvement in some areas and decline in others. Specifically, improvements were noted on a measure of simple auditory working memory and select learning/memory tasks (i.e., story, shapes). In contrast, declines were observed on other measures of working memory and learning/memory (i.e., word list, daily living instructions) as well as on select measures of processing speed and executive functioning.  DIAGNOSTIC IMPRESSION   Results of the current evaluation indicated variability across all cognitive domains, with evidence of both decline and improvement when compared to her prior neuropsychological testing in 2022. While there has been an overall reduction in her current level of daily functioning, the clinical picture does not strongly support a neurodegenerative etiology. Rather, the pattern of findings, along with her medical and psychiatric history, suggests a multifactorial cause for her cognitive difficulties. Likely contributing factors include ongoing mood symptoms, sleep disturbance, persistent fatigue, chronic pain, and medication side effects (e.g., dicyclomine , alprazolam ). To the extent that modifiable factors can be ameliorated, she may find that her subjective cognitive concerns improve. Results provide an updated baseline for future comparison should reevaluation become necessary.  ICD-10 Codes: F06.70 Mild neurocognitive disorder; F31.89 Bipolar disorder, by history   RECOMMENDATIONS   Continue mental health treatment, especially given that emotional distress can exacerbate  cognitive difficulties. Discuss current medication regimen with your prescribing provider to ensure you are receiving maximum benefit. You may also wish to consider counseling or relaxation techniques for additional support.  Prioritize staying cognitively, physically, and socially active, as these are all key to supporting mood, brain health, and overall well-being. Aim to stay socially connected through regular contact  with family, friends, or community groups--even brief or low-effort interactions help reduce isolation and improve emotional resilience. Mentally engaging activities like reading, puzzles, or learning new skills help maintain cognitive function. A Mediterranean-style diet rich in vegetables, whole grains, healthy fats, and lean proteins has been linked to better mood and brain health. For physical activity, even with physical limitations, low-impact options such as gentle yoga, stretching, aquatic exercise, or short walks can provide mood benefits without overexertion. Movement should be adapted to your comfort level, with consistency prioritized over intensity.  Given ongoing sleep difficulties, she may benefit from the implementation of sleep hygiene techniques, including:  Go to bed and get up at the same time each day to help your body establish a regular rhythm. Establish and maintain a bedtime routine. Certain activities such as stretching, meditating, listening to soft music, or reading ~15 minutes before bedtime can be a great way to regularly get your brain and body ready for sleep. Avoid taking naps during the day. Avoid alcohol  and caffeine for 5 or 6 hours before going to bed. Get regular exercise, but not in the hours before bedtime. Use comfortable bedding and maintain a cool temperature in your bedroom. Block out light and distracting noise. Avoid watching television or using your phone/computer in bed. Avoid staying in bed if you have difficulty falling asleep. If you  have not been able to get to sleep after about 20 minutes or more, get up and do something calming or boring until you feel sleepy, then return to bed and try again.  Consider implementing compensatory strategies to maximize independence and maintain daily functioning. Examples include:   Adhere to routine. Compensatory strategies work best when they are used consistently. Use a planner, calendar, or white board that has the schedule and important events for the day clearly listed to reference and cross off when tasks are complete.  Ask for written information, especially if it is new or unfamiliar (e.g., information provided at a doctor's appointment).  Create an organized environment. Keep items that can be easily misplaced in a sensible location and get into the habit of always returning the items to those places.  Pay attention and reduce distractions. Make a point of focusing attention on information you want to remember. One-on-one interaction is more likely to facilitate attention and minimize distraction. Make eye contact and repeat the information out loud after you hear it. Reduce interruptions or distractions especially when attempting to learn new information.  Create associations. When learning something new, think about and understand the information. Explain it in your own words or try to associate it with something you already know. Take notes to help remember important details. Evaluate goals and plan accordingly. When confronted by many different tasks, begin by making a list that prioritizes each task and estimates the time it will take to complete. Break down complicated tasks into smaller, more manageable steps.  Focus on one task at a time and complete each task before starting another. Avoid multitasking.  DISPOSITION   Patient will follow up with the referring provider, Dr. Tobie. No follow-up neuropsychological testing was scheduled at this time. Please feel free to refer the  patient for repeated evaluation if she shows a significant change in neurocognitive status. She will be provided verbal feedback in approximately one week regarding the findings and impression during this visit.  The remainder of the report includes the details of the patient's background and a table of results from the current evaluation, which support the  summary and recommendations described above.  BACKGROUND   History of Presenting Illness: The following information was obtained from a review of medical records and an interview with the patient. Briefly, the patient was previously hospitalized in August 2019 (08/04-08/23) for sepsis, hepatic encephalopathy, and drug-induced liver injury. Hospitalization was notable for healthcare-associated pneumonia, E. coli sepsis, delirium, and liver injury. She later established care with Dr. Tobie at [facility] in 2020 and has since been followed for the management of right peroneal neuropathy, falls, and memory loss. She underwent neuropsychological assessment in 2022 (summary below) due to cognitive concerns that began around 2019 before her hospitalization. These symptoms were described as variable (i.e., fluctuating rather than progressive) but worse in that she never returned to her premorbid baseline. During her most recent visit with Dr. Tobie in August 2025, it was reported that her memory is worsening. Specifically, she has been forgetting everyday tasks and details. For example, she is unable to drive to the grocery store without using GPS and often forgets items she needs to buy. Additionally, she does not manage her money or finances. An updated neuropsychological evaluation was requested accordingly.  Previous Neuropsychological Evaluation with Arthea Maryland, Ph.D., ABPP (10/13/2021): Briefly, results suggested performance variability across all aspects of learning and memory. Despite performing low on a visuomotor task assessing cognitive  flexibility, all other tasks assessing executive functioning were appropriate. No consistent impairments emerged across any assessed cognitive domain. Overall, the most likely culprit for ongoing subjective dysfunction is a combination of the numerous medical and psychiatric factors. Current test results suggesting variability across learning and memory are certainly a reasonable finding within this proposed etiology. Due to the sheer volume of possible contributing factors, it is impossible to narrow down which conditions are playing greater roles than others at the present time. However, being more active in addressing psychiatric distress would be an ideal place to start future treatment.  Cognitive Functioning: During today's appointment, the patient reported primary concerns with short-term memory, which have been worse recently. She described forgetting daily tasks and details--for example, being given directions to the elevator and then immediately forgetting them or needing GPS to drive to the local grocery store. She occasionally forgets details of conversations and events and may misplace items. She reported slowed reflexes and experiences some difficulty with word finding. Additionally, she describes feelings of jitteriness and difficulty maintaining focus. She denied significant impairments in executive functions such as planning or organizing. Her primary goal is to determine the underlying cause of these symptoms in order to initiate appropriate treatment.  Physical Functioning: Patient reported fragmented sleep, waking up every few hours. She described consistently poor energy levels. Appetite is low but stable. Sense of smell and taste are reportedly diminished. Vision and hearing remain stable. She experiences chronic pain related to fibromyalgia and osteoarthritis. Balance is poor and she continues to have falls. She has a tremor in both hands. Recently, she has been frustrated with her speech,  which sounds slurred to her, with some days being worse than others.  Psychological Functioning: Patient described significant frustration lately due to physical changes, including speech difficulties, ongoing falls, and weakening strength. However, she feels that her medications help keep her relatively upbeat. She denied recent suicidal ideation. She continues to experience panic attacks. She is currently managing mood symptoms solely with medications. Of note, she reported visual hallucinations over the past two months, seeing snakes and spiders on the walls that disappear when she turns to look at them. Her family  and best friend provide her with good social support.  Imaging: MRI of the brain (09/06/2022) documented mild generalized volume loss. She is scheduled for an updated brain MRI this Saturday.  Other Relevant Medical History: Remarkable for fibromyalgia, migraine, polyneuropathy, hypotension, hyperlipidemia, hypothyroidism, hyperparathyroidism, chronic kidney disease, irritable bowel syndrome, gastroesophageal reflux disease, and s/p bariatric surgery. Please refer to the medical record for a more comprehensive problem list. Patient reported a history of two concussions, one of which involved very brief loss of consciousness (i.e., seconds). She experienced no major sequelae, aside from some headaches and vision changes that resolved within two to three days. No history of stroke, CNS infection, or seizure was reported.  Current Medications: Per record, alprazolam , dicyclomine , duloxetine , esomeprazole, furosemide , gabapentin , levothyroxine , Linzess , magnesium , midodrine , ondansetron , quetiapine , rifaximin , Refresh Optive, spironolactone , tizanidine , ursodiol, Vraylar , and thiamine .  Functional Status: Patient stopped driving last year due to severe panic attacks and difficulty remembering how to get to familiar places. Her husband has taken over managing the finances because she was making  errors. He also fills her pill box; she takes her medications independently, though he may prompt her on difficult days. She continues to prepare meals, do laundry, and clean. She is able to perform all basic activities of daily living independently and without difficulty.  Family Neurological History: Remarkable for unspecified dementia in the patient's paternal grandmother and possibly her father, as well as Alzheimer's disease in her maternal great aunt.  Psychiatric History: Remarkable for bipolar disorder and anxiety with panic attacks, currently managed with medication prescribed by her psychiatrist. She reported a history of counseling in the past but is not currently engaged in therapy. She has a remote history of suicidal ideation and plan without attempt.  Substance Use History: Patient smokes approximately one pack of cigarettes per day. She reported infrequent alcohol  consumption. She denied current use of marijuana or other illicit substances. She previously used delta-8 for pain and sleep but has not used it in the past three months.  Social and Developmental History: Patient was born in [place], [place]. History of perinatal complications and developmental delays was not reported. She noted that her mother told her the soft spot on [her] head closed too early, leading to regular neurology visits during the first two years of her life. She is married and has one child. She lives with her husband.  Educational and Occupational History: No history of childhood learning disability, special education services, or grade retention was reported. 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 Math was previously noted as a relative weakness. She earned a Manufacturing engineer in higher education. She is currently on disability and has not worked since 2017. 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  BEHAVIORAL OBSERVATIONS   Patient arrived on time and  was unaccompanied. She ambulated with a walker and a right CAM boot. She was alert and oriented with the exception of the exact date (i.e., stated it was the 16th). She was appropriately groomed and dressed for the setting. No significant sensory or motor abnormalities were observed. Vision (with glasses) and hearing were adequate for testing purposes. Speech was of normal rate, prosody, and volume. No conversational word-finding difficulties, paraphasic errors, or dysarthria were observed. Comprehension was conversationally intact. Thought processes were linear, logical, and coherent. Thought content was organized and devoid of delusions. Insight appeared appropriate. Affect was even and congruent with mood. While she was cooperative during testing, she scored below the cutoff on  one standalone measure of performance validity. However, other standalone and embedded measures of performance validity were within acceptable limits. Accordingly, results should be interpreted with caution.  NEUROPSYCHOLOGICAL TESTING RESULTS   Tests Administered: Animal Naming Test; Beck Depression Inventory II (BDI-II); Controlled Oral Word Association Test (COWAT): FAS; Delis-Kaplan Executive Function System (D-KEFS) - Subtest(s): Color-Word Interference Test; Neuropsychological Assessment Battery (NAB) Form 1 - Subtest(s): Digits Forward, Digits Backward, Naming, List Learning, Story Learning, Shape Learning, Daily Living Memory; PROMIS Anxiety Questionnaire; PROMIS Sleep Disturbance Questionnaire; Test of Premorbid Functioning (TOPF); Trail Making Test (TMT); and Wechsler Adult Intelligence Scale Fifth Edition (WAIS-5) - Subtest(s): Block Design, Matrix Reasoning, Coding.  Test results are provided in the table below. Whenever possible, the patient's scores were compared against age-, sex-, and education-corrected normative samples. Interpretive descriptions are based on the AACN consensus conference statement on uniform  labeling (Guilmette et al., 2020).   10/13/2021 06/12/2024  PREMORBID FUNCTIONING  RAW  RANGE  TOPF StdS=101 44 StdS=101 Average  ATTENTION & WORKING MEMORY  RAW  RANGE  NAB Digits Forward T=39 -- T=48 Average  NAB Digits Backward T=49 -- T=32 Below Average  PROCESSING SPEED  RAW  RANGE  Trails A 29''0e, T=43 43''0e T=34 Below Average  WAIS-IV/WAIS-5 Coding  ss=6 -- ss=6 Low Average  DKEFS CWIT Color Naming 27''0e, ss=11 37''1e ss=7 Low Average  DKEFS CWIT Word Reading 23''0e, ss=10 25''0e ss=9 Average  EXECUTIVE FUNCTION  RAW  RANGE  Trails B 118''1e, T=29 D/C w/5e -- --  WAIS-IV/WAIS-5 Matrix Reasoning ss=6 -- ss=6 Low Average  COWAT Letter Fluency 38, T=42 17+12+14 T=46 Average  DKEFS CWIT Inhibition 59''1e, ss=10 99''2e ss=2 Exceptionally Low  DKEFS CWIT Inhibition/Switching 69''2e, ss=10 143''10e ss=1 Exceptionally Low  LANGUAGE  RAW  RANGE  COWAT Letter Fluency 38, T=42 17+12+14 T=46 Average  Animal Naming Test 24, T=50 15 T=33 Below Average  NAB Naming Test 31/31, T=53 31/31 T=53 WNL  VISUOSPATIAL  RAW  RANGE  WAIS-IV/WAIS-5 Block Design ss=6 -- ss=4 Below Average  NAB Figure Drawing Copy T=43 28/33 T=51 Average  LEARNING & MEMORY  RAW  RANGE  NAB List Learning Total Trials 1-3 20/36, T=36 14/36 T=24 Exceptionally Low  NAB List Learning List B 5/12, T=48 2/12 T=28 Exceptionally Low  NAB List Learning SDFR 5/12, T=30 1/12 T=19 Exceptionally Low  NAB List Learning LDFR 3/12, T=22 3/12 T=22 Exceptionally Low  NAB List Learning Recognition Discriminability 5h, 75fa, T=29 10h, 19fa T=26 Exceptionally Low  NAB Story Learning Immediate Recall 54/80, T=35 67/80 T=49 Average  NAB Story Learning Delayed Recall 28/40, T=35 35/40 T=48 Average  NAB Shape Learning Immediate Recognition 13/27, T=36 16/27 T=45 Average  NAB Shape Learning Delayed Recognition 4/9, T=32 3/9 T=24 Exceptionally Low  NAB Shape Learning Delayed RDI 4h, 8fa, T=27 5h, 12fa T=38 Low Average  NAB Daily Living Memory  Immediate Recall 48/51, T=57 40/51 T=38 Low Average  NAB Daily Living Memory Delayed Recall 16/17, T=57 14/17 T=40 Low Average  NAB Daily Living Memory Recognition Hits 10/10, T=58 8/10 T=43 Average  QUESTIONNAIRES  RAW  RANGE  BDI-II 26 22 -- Moderate  PROMIS Anxiety Questionnaire 27 21 -- Moderate  PROMIS Sleep Questionnaire 33 32 -- Moderate  *N+[id number]: ss = scaled score; StdS = standard score; T = t-score; C/S = corrected raw score; WNL = within normal limits; BNL= below normal limits; D/C = discontinued. Scores from skewed distributions are typically interpreted as WNL (>=16th %ile) or BNL (<16th %ile).   INFORMED CONSENT  Patient was provided with a verbal description of the nature and purpose of the neuropsychological evaluation. Also reviewed were the foreseeable risks and/or discomforts and benefits of the procedure, limits of confidentiality, and mandatory reporting requirements of this provider. Patient was given the opportunity to have their questions answered. Oral consent to participate was provided by the patient.   This report was prepared as part of a clinical evaluation and is not intended for forensic use.  SERVICE   This evaluation was conducted by Renda Beckwith, Psy.D. In addition to time spent directly with the patient, total professional time (180 minutes) includes record review, integration of relevant medical history, test selection, interpretation of findings, and report preparation. Additionally, all testing was administered and scored directly by Dr. Beckwith (153 minutes).  Psychiatric Diagnostic Evaluation Services (Professional): 09208 x 1 Neuropsychological Testing Evaluation Services (Professional): 03867 x 1 Neuropsychological Testing Evaluation Services (Professional): 03866 x 2 Neuropsychological Test Administration and Scoring: [phone] x 1 Neuropsychological Test Administration and Scoring: [phone] x 4  This report was generated using voice recognition  software. While this document has been carefully reviewed, transcription errors may be present. I apologize in advance for any inconvenience. Please contact me if further clarification is needed.            Renda Beckwith, Psy.D.             Neuropsychologist

## 2024-06-15 ENCOUNTER — Telehealth: Payer: Self-pay

## 2024-06-15 NOTE — Telephone Encounter (Signed)
 Called patient and waiting for Authorization to come back

## 2024-06-16 ENCOUNTER — Ambulatory Visit
Admission: RE | Admit: 2024-06-16 | Discharge: 2024-06-16 | Disposition: A | Discharge status: Home / Self Care | Source: Ambulatory Visit | Attending: Neurology | Admitting: Neurology

## 2024-06-16 DIAGNOSIS — R2681 Unsteadiness on feet: Secondary | ICD-10-CM

## 2024-06-16 DIAGNOSIS — R413 Other amnesia: Secondary | ICD-10-CM | POA: Diagnosis not present

## 2024-06-16 DIAGNOSIS — R42 Dizziness and giddiness: Secondary | ICD-10-CM | POA: Diagnosis not present

## 2024-06-18 ENCOUNTER — Ambulatory Visit

## 2024-06-21 ENCOUNTER — Other Ambulatory Visit (INDEPENDENT_AMBULATORY_CARE_PROVIDER_SITE_OTHER): Payer: Self-pay

## 2024-06-21 ENCOUNTER — Ambulatory Visit: Admitting: Orthopedic Surgery

## 2024-06-21 DIAGNOSIS — S92314A Nondisplaced fracture of first metatarsal bone, right foot, initial encounter for closed fracture: Secondary | ICD-10-CM

## 2024-06-22 ENCOUNTER — Encounter: Payer: Self-pay | Admitting: Orthopedic Surgery

## 2024-06-22 ENCOUNTER — Telehealth: Payer: Self-pay | Admitting: Physician Assistant

## 2024-06-22 NOTE — Telephone Encounter (Signed)
 Pt called and asked why her injection is $388.00. Pt states she contacted her insurance and [facility] and no one gave her an answer. Please call pt first thing Monday morning with an answer before pt att at 10:30 am. Sari will be in after 9 am so please call pt first thing Tori. Pt phone number is [phone].

## 2024-06-22 NOTE — Progress Notes (Signed)
 Office Visit Note   Patient: Jordan Russell           Date of Birth: [birth year]           MRN: [id number] Visit Date: 06/21/2024              Requested by: Dwight Trula SQUIBB, MD [address] [place],  [place] [address] PCP: Dwight Trula SQUIBB, MD  Chief Complaint  Patient presents with   Right Foot - Follow-up      HPI: Patient is a 53 year old woman who is seen in follow-up for closed fractures of the base of the 1st and 2nd metatarsals.  Patient has been wearing a cam boot  Assessment & Plan: Visit Diagnoses:  1. Closed nondisplaced fracture of first metatarsal bone of right foot, initial encounter     Plan: Patient will wean off the walker then wean off the fracture boot into a stiff soled sneaker.  Follow-Up Instructions: No follow-ups on file.   Ortho Exam  Patient is alert, oriented, no adenopathy, well-dressed, normal affect, normal respiratory effort. Examination there is no swelling or blistering in the right foot.  The fracture site is not tender to palpation.    Imaging: No results found. No images are attached to the encounter.  Labs: Lab Results  Component Value Date   ESRSEDRATE 5 06/04/2018   REPTSTATUS 06/13/2018 FINAL 06/08/2018   CULT  06/08/2018    NO GROWTH 5 DAYS Performed at [facility], [address] [place], [place] [address]    [facility] ESCHERICHIA COLI 06/04/2018     Lab Results  Component Value Date   ALBUMIN  4.0 07/06/2023   ALBUMIN  3.6 03/25/2023   ALBUMIN  3.4 (L) 09/06/2022    Lab Results  Component Value Date   MG 1.9 07/06/2023   Lab Results  Component Value Date   VD25OH 53 03/05/2024    No results found for: PREALBUMIN    Latest Ref Rng & Units 03/25/2023   12:46 PM 09/06/2022    3:00 AM 09/05/2022    5:50 PM  CBC EXTENDED  WBC 4.0 - 10.5 K/uL 4.4  6.5  6.2   RBC 3.87 - 5.11 MIL/uL 4.04  4.26  3.87   Hemoglobin 12.0 - 15.0 g/dL 86.2  86.0  87.1   HCT 36.0 - 46.0 % 41.8  43.9  40.3    Platelets 150 - 400 K/uL 262  213  247   NEUT# 1.7 - 7.7 K/uL   3.7   Lymph# 0.7 - 4.0 K/uL   1.5      There is no height or weight on file to calculate BMI.  Orders:  Orders Placed This Encounter  Procedures   XR Foot Complete Right   No orders of the defined types were placed in this encounter.    Procedures: No procedures performed  Clinical Data: No additional findings.  ROS:  All other systems negative, except as noted in the HPI. Review of Systems  Objective: Vital Signs: There were no vitals taken for this visit.  Specialty Comments:  No specialty comments available.  PMFS History: Patient Active Problem List   Diagnosis Date Noted   Lisfranc's sprain, right, initial encounter 04/03/2024   Osteoporosis with pathological fracture 03/05/2024   Acute encephalopathy 09/05/2022   S/P laparoscopy 08/30/2022   Abnormal involuntary movement 10/01/2021   Alopecia 10/01/2021   Cholestasis 10/01/2021   Changes in skin texture 10/01/2021   Ataxia 10/01/2021   Enthesopathy  of hip region 10/01/2021   Contact dermatitis 10/01/2021   Chronic rhinitis 10/01/2021   Chronic idiopathic constipation 10/01/2021   Hyperlipidemia 10/01/2021   Hyperesthesia 10/01/2021   Hemorrhoid 10/01/2021   Fatigue 10/01/2021   Eustachian tube disorder 10/01/2021   Pruritus 10/01/2021   Polyneuropathy 10/01/2021   Pharyngeal dysphagia 10/01/2021   Peripheral venous insufficiency 10/01/2021   Edema of lower extremity 10/01/2021   Insomnia 10/01/2021   Vitamin B12 deficiency (non anemic) 10/01/2021   Recurrent falls 10/01/2021   Vulvitis 10/01/2021   Atypical chest pain 10/01/2021   Major depressive disorder 09/29/2021   Chronic kidney disease 09/29/2021   Sinus bradycardia 06/29/2021   Hypotension 06/29/2021   Wears glasses 06/25/2021   Urgency of urination 06/25/2021   Self mutilating behavior 06/25/2021   PONV (postoperative nausea and vomiting) 06/25/2021   Left ureteral  stone 06/25/2021   Hiatal hernia 06/25/2021   Gastroparesis 06/25/2021   Morbid obesity (HCC) 05/22/2020   Cigarette nicotine dependence 05/22/2020   Microhematuria 01/04/2019   Colitis 06/10/2018   Hypoalbuminemia 06/10/2018   HCAP (healthcare-associated pneumonia) 06/09/2018   Encephalopathy, hepatic (HCC)    E coli bacteremia    Severe malnutrition    Drug-induced liver injury 06/08/2018   Elevated LFTs 06/08/2018   Hyperammonemia 06/08/2018   Anasarca 06/08/2018   Hypoxia 06/05/2018   Hyperbilirubinemia 06/05/2018   Fatty infiltration of liver 06/05/2018   Severe sepsis 06/04/2018   Trochanteric bursitis, left hip 09/08/2017   Pain in right hip 09/08/2017   Carpal tunnel syndrome of left wrist 12/17/2016   Calculus of kidney 12/17/2016   Hypothyroidism 12/17/2016   Hydronephrosis with renal and ureteral calculus obstruction 12/17/2016   Hydradenitis 12/17/2016   HPTH (hyperparathyroidism) 12/17/2016   Nocturia 12/17/2016   Intractable migraine with aura without status migrainosus 12/17/2016   Arthralgia of multiple joints 12/17/2016   Iron deficiency anemia 12/17/2016   Varicose veins of bilateral lower extremities with pain 06/28/2016   IBS (irritable bowel syndrome) 03/10/2016   Generalized anxiety disorder 03/10/2016   GERD (gastroesophageal reflux disease) 03/10/2016   Epigastric pain 06/04/2015   Bipolar disorder, unspecified 08/03/2014   Rheumatoid arthritis 08/03/2014   Fibromyalgia 08/03/2014   History of nutritional disorder 08/03/2014   Hidradenitis 08/03/2014   Chondromalacia 02/26/2014   Bilateral knee pain 02/05/2014   Vitamin D  deficiency 12/14/2013   Status post bariatric surgery 12/14/2013   Past Medical History:  Diagnosis Date   Abnormal involuntary movement 10/01/2021   Acute liver failure without hepatic coma    AKI (acute kidney injury) 06/05/2018   Alopecia 10/01/2021   Anasarca 06/08/2018   Arthralgia of multiple joints 12/17/2016    Asthma    Ataxia 10/01/2021   Atypical chest pain 10/01/2021   Bilateral knee pain 02/05/2014   Bipolar disorder, unspecified 08/03/2014   Calculus of kidney 12/17/2016   Carpal tunnel syndrome of left wrist 12/17/2016   Cholestasis 10/01/2021   Chondromalacia 02/26/2014   Chronic idiopathic constipation 10/01/2021   Chronic kidney disease    Chronic rhinitis 10/01/2021   Colitis 06/10/2018   Contact dermatitis 10/01/2021   Corn of toe 10/01/2021   Cutaneous eruption 12/17/2016   Delirium due to multiple etiologies 06/11/2018   Drug-induced liver injury 06/08/2018   E coli bacteremia    Edema of lower extremity 10/01/2021   Elevated LFTs 06/08/2018   Encephalopathy, hepatic (HCC)    Enthesopathy of hip region 10/01/2021   Epigastric pain 06/04/2015   Eustachian tube disorder 10/01/2021   Fatigue 10/01/2021  Fatty infiltration of liver 06/05/2018   Fibromyalgia    Gastroparesis    Generalized anxiety disorder 03/10/2016   GERD (gastroesophageal reflux disease)    HCAP (healthcare-associated pneumonia) 06/09/2018   Hemorrhoid 10/01/2021   Hiatal hernia    Hidradenitis 08/03/2014   History of gastric bypass 12/14/2013   History of kidney stones    History of nutritional disorder 08/03/2014   HPTH (hyperparathyroidism) 12/17/2016   Hydradenitis 12/17/2016   Hydronephrosis with renal and ureteral calculus obstruction 12/17/2016   Hyperammonemia 06/08/2018   Hyperbilirubinemia 06/05/2018   Hyperesthesia 10/01/2021   Hyperlipidemia 10/01/2021   Hypoalbuminemia 06/10/2018   Hypotension    Hypothyroidism    Hypoxia 06/05/2018   IBS (irritable bowel syndrome)    Insomnia 10/01/2021   Intractable migraine with aura without status migrainosus 12/17/2016   Iron deficiency anemia 12/17/2016   Jaundice 06/10/2018   Left ureteral stone    Major depressive disorder    s/p  bilateral inferior parathyroidectomy 08-03-2010   Microhematuria 01/04/2019   Nocturia 12/17/2016    Pain in left hip 09/08/2017   Peripheral venous insufficiency 10/01/2021   Pharyngeal dysphagia 10/01/2021   Pneumonia    Polyneuropathy 10/01/2021   PONV (postoperative nausea and vomiting)    and claustrophobic with mask   Pruritus 10/01/2021   Recurrent falls 10/01/2021   Rheumatoid arthritis 08/03/2014   Self mutilating behavior    Severe malnutrition    Severe sepsis 06/04/2018   Sinus bradycardia 06/29/2021   Status post bariatric surgery 12/14/2013   Trochanteric bursitis, left hip 09/08/2017   Urgency of urination    Varicose veins of bilateral lower extremities with pain 06/28/2016   Vitamin B12 deficiency (non anemic) 10/01/2021   Vitamin D  deficiency 12/14/2013   Vulvitis 10/01/2021   Wears glasses     Family History  Problem Relation Age of Onset   Breast cancer Mother    Anxiety disorder Brother    Depression Brother    Breast cancer Maternal Grandmother    Bipolar disorder Son    Breast cancer Maternal Aunt     Past Surgical History:  Procedure Laterality Date   ABDOMINAL HYSTERECTOMY     BALLOON DILATION N/A 10/01/2014   Procedure: BALLOON DILATION;  Surgeon: Gladis MARLA Louder, MD;  Location: [facility];  Service: Endoscopy;  Laterality: N/A;   BIOPSY  03/05/2021   Procedure: BIOPSY;  Surgeon: Saintclair Jasper, MD;  Location: [facility];  Service: Gastroenterology;;   CYSTO/  BILATERAL RETROGRADE PYELOGRAM  11/20/2000   CYSTO/  RIGHT RETROGRADE PYELOGRAM/  RIGHT URETEROSCOPY/  STENT PLACEMENT  12/16/2006   CYSTOSCOPY/URETEROSCOPY/HOLMIUM LASER/STENT PLACEMENT Left 11/09/2016   Procedure: CYSTOSCOPY/URETEROSCOPY/HOLMIUM LASER/STENT PLACEMENT;  Surgeon: Redell Lynwood Napoleon, MD;  Location: [facility];  Service: Urology;  Laterality: Left;  90 MINS  [phone]    ESOPHAGEAL MANOMETRY N/A 12/23/2014   Procedure: ESOPHAGEAL MANOMETRY (EM);  Surgeon: Gladis MARLA Louder, MD;  Location: [facility];  Service: Endoscopy;  Laterality: N/A;    ESOPHAGOGASTRODUODENOSCOPY  last one 03-28-2015   ESOPHAGOGASTRODUODENOSCOPY (EGD) WITH PROPOFOL  N/A 10/01/2014   Procedure: ESOPHAGOGASTRODUODENOSCOPY (EGD) WITH PROPOFOL ;  Surgeon: Gladis MARLA Louder, MD;  Location: [facility];  Service: Endoscopy;  Laterality: N/A;   ESOPHAGOGASTRODUODENOSCOPY (EGD) WITH PROPOFOL  N/A 03/05/2021   Procedure: ESOPHAGOGASTRODUODENOSCOPY (EGD) WITH PROPOFOL ;  Surgeon: Saintclair Jasper, MD;  Location: [facility];  Service: Gastroenterology;  Laterality: N/A;   EXTRACORPOREAL SHOCK WAVE LITHOTRIPSY  multiple times since age 12   HOLMIUM LASER APPLICATION Left 11/09/2016   Procedure: HOLMIUM LASER  APPLICATION;  Surgeon: Redell Lynwood Napoleon, MD;  Location: [facility];  Service: Urology;  Laterality: Left;   KNEE ARTHROSCOPY Right 03-04-2014  [facility]   w/ Arthrotomy   LAPAROSCOPIC ASSISTED VAGINAL HYSTERECTOMY  12/28/2005   LAPAROSCOPIC CHOLECYSTECTOMY  01/17/2004   LAPAROSCOPY N/A 08/30/2022   Procedure: LAPAROSCOPY DIAGNOSTIC, LAPAROSCOPIC RESECTION OF CANDY CANE LIMB;  Surgeon: Tanda Locus, MD;  Location: [facility];  Service: General;  Laterality: N/A;   LAPAROSCOPY LEFT OVARIAN CYSTECTOMY/  BILATERAL TUBAL LIGATION  02/26/2003   LEFT URETEROSCOPIC STONE EXTRACTION /  STENT PLACEMENT  07/09/2002   NECK EXPLORATION/  BILATERAL INFERIOR PARATHYROIDECTOMY  08/03/2010   RIGHT URETERAL DILATION/  URETEROSCOPIC STONE EXTRACTION  06/12/2010   ROUX-EN-Y GASTRIC BYPASS  2001   SOLYX TRANSURETHRAL SLING/  POSTERIOR PELVIC FLOOR SACROSPINOUS REPAIR  02/21/2009   and Cysto/  Bilateral ureteral stent placement   TONSILLECTOMY AND ADENOIDECTOMY     UPPER GI ENDOSCOPY N/A 08/10/2021   Procedure: UPPER GI ENDOSCOPY;  Surgeon: Tanda Locus, MD;  Location: [facility];  Service: General;  Laterality: N/A;   UPPER GI ENDOSCOPY N/A 08/30/2022   Procedure: UPPER GI ENDOSCOPY;  Surgeon: Tanda Locus, MD;  Location: [facility];  Service: General;  Laterality: N/A;   WRIST GANGLION  EXCISION Right 01/28/2000   XI ROBOTIC ASSISTED HIATAL HERNIA REPAIR N/A 08/10/2021   Procedure: XI ROBOTIC ASSISTED HIATAL HERNIA REPAIR WITH MESH, ROBOTIC LYSIS OF ADHESIONS;  Surgeon: Tanda Locus, MD;  Location: [facility];  Service: General;  Laterality: N/A;   Social History   Occupational History   Occupation: disabled   Tobacco Use   Smoking status: Former    Current packs/day: 0.00    Average packs/day: 1 pack/day for 20.0 years (20.0 ttl pk-yrs)    Types: Cigarettes    Start date: 01/30/2002    Quit date: 01/30/2022    Years since quitting: 2.3   Smokeless tobacco: Never  Vaping Use   Vaping status: Every Day   Start date: 01/30/2022   Substances: Nicotine, Flavoring   Devices: Vuuse  Substance and Sexual Activity   Alcohol  use: Not Currently    Comment: very rare   Drug use: No   Sexual activity: Not on file

## 2024-06-25 ENCOUNTER — Other Ambulatory Visit: Payer: Self-pay

## 2024-06-25 ENCOUNTER — Ambulatory Visit

## 2024-06-25 ENCOUNTER — Telehealth: Payer: Self-pay

## 2024-06-25 DIAGNOSIS — M8000XA Age-related osteoporosis with current pathological fracture, unspecified site, initial encounter for fracture: Secondary | ICD-10-CM

## 2024-06-25 NOTE — Telephone Encounter (Signed)
 Called and spoke to patient and advised her that her 20%oop and co pay is the $388.00 cost and moved her appointment to Sept 8th waiting on authorization

## 2024-06-26 ENCOUNTER — Encounter: Admitting: Psychology

## 2024-06-28 DIAGNOSIS — K219 Gastro-esophageal reflux disease without esophagitis: Secondary | ICD-10-CM | POA: Diagnosis not present

## 2024-06-28 DIAGNOSIS — K5900 Constipation, unspecified: Secondary | ICD-10-CM | POA: Diagnosis not present

## 2024-06-28 DIAGNOSIS — K719 Toxic liver disease, unspecified: Secondary | ICD-10-CM | POA: Diagnosis not present

## 2024-07-03 ENCOUNTER — Telehealth: Payer: Self-pay

## 2024-07-03 NOTE — Telephone Encounter (Signed)
 Patient called and stated that she has an appointment for an Prolia injection scheduled on 07/09/24. She is asking if she can get the injection the same day she is scheduled with Dr. Vernetta on 07/16/24. Stated she did not want a nurse giving her the injection, she wants Dr. Vernetta.  Please call and advise at [phone]

## 2024-07-04 ENCOUNTER — Telehealth: Payer: Self-pay

## 2024-07-04 DIAGNOSIS — R42 Dizziness and giddiness: Secondary | ICD-10-CM | POA: Diagnosis not present

## 2024-07-04 DIAGNOSIS — N6323 Unspecified lump in the left breast, lower outer quadrant: Secondary | ICD-10-CM | POA: Diagnosis not present

## 2024-07-04 NOTE — Telephone Encounter (Signed)
 Called and spoke to the patient and let her know that I am waiting on PA from the insurance and to keep her appointment on the 8th unless she hears from me by Friday

## 2024-07-05 ENCOUNTER — Ambulatory Visit (INDEPENDENT_AMBULATORY_CARE_PROVIDER_SITE_OTHER)

## 2024-07-05 ENCOUNTER — Other Ambulatory Visit: Payer: Self-pay | Admitting: Internal Medicine

## 2024-07-05 DIAGNOSIS — M810 Age-related osteoporosis without current pathological fracture: Secondary | ICD-10-CM | POA: Diagnosis not present

## 2024-07-05 DIAGNOSIS — N6323 Unspecified lump in the left breast, lower outer quadrant: Secondary | ICD-10-CM

## 2024-07-06 ENCOUNTER — Ambulatory Visit: Payer: Self-pay | Admitting: Physician Assistant

## 2024-07-06 LAB — EXTRA LAV TOP TUBE

## 2024-07-06 LAB — BASIC METABOLIC PANEL WITH GFR
BUN: 20 mg/dL (ref 7–25)
CO2: 22 mmol/L (ref 20–32)
Calcium: 9.4 mg/dL (ref 8.6–10.4)
Chloride: 108 mmol/L (ref 98–110)
Creat: 0.87 mg/dL (ref 0.50–1.03)
Glucose, Bld: 96 mg/dL (ref 65–99)
Potassium: 4.2 mmol/L (ref 3.5–5.3)
Sodium: 140 mmol/L (ref 135–146)
eGFR: 79 mL/min/1.73m2 (ref >=60)

## 2024-07-06 LAB — VITAMIN D 25 HYDROXY (VIT D DEFICIENCY, FRACTURES): Vit D, 25-Hydroxy: 54 ng/mL (ref 30–100)

## 2024-07-06 NOTE — Progress Notes (Signed)
 Patient came in for labs only.

## 2024-07-08 ENCOUNTER — Other Ambulatory Visit: Payer: Self-pay | Admitting: Orthopaedic Surgery

## 2024-07-09 ENCOUNTER — Telehealth: Payer: Self-pay

## 2024-07-09 ENCOUNTER — Ambulatory Visit

## 2024-07-09 NOTE — Telephone Encounter (Signed)
 Patient was Denied by insurance so I called and LVM for patient to call office and not to come In that I need to appeal her case

## 2024-07-11 ENCOUNTER — Telehealth: Payer: Self-pay | Admitting: Physician Assistant

## 2024-07-11 NOTE — Telephone Encounter (Signed)
 Patient called returning your call. CB#[phone]

## 2024-07-12 ENCOUNTER — Telehealth: Payer: Self-pay

## 2024-07-12 ENCOUNTER — Telehealth (HOSPITAL_BASED_OUTPATIENT_CLINIC_OR_DEPARTMENT_OTHER): Payer: Self-pay

## 2024-07-12 NOTE — Telephone Encounter (Signed)
 Spoke to patient she is coming in for appointment on Monday

## 2024-07-12 NOTE — Telephone Encounter (Signed)
 Spoke to patient and she is coming in Monday at 1:30 to have her injection

## 2024-07-13 ENCOUNTER — Ambulatory Visit
Admission: RE | Admit: 2024-07-13 | Discharge: 2024-07-13 | Disposition: A | Discharge status: Home / Self Care | Source: Ambulatory Visit | Attending: Internal Medicine | Admitting: Internal Medicine

## 2024-07-13 DIAGNOSIS — N6321 Unspecified lump in the left breast, upper outer quadrant: Secondary | ICD-10-CM | POA: Diagnosis not present

## 2024-07-13 DIAGNOSIS — N6323 Unspecified lump in the left breast, lower outer quadrant: Secondary | ICD-10-CM

## 2024-07-13 DIAGNOSIS — R928 Other abnormal and inconclusive findings on diagnostic imaging of breast: Secondary | ICD-10-CM | POA: Diagnosis not present

## 2024-07-13 DIAGNOSIS — R92323 Mammographic fibroglandular density, bilateral breasts: Secondary | ICD-10-CM | POA: Diagnosis not present

## 2024-07-16 ENCOUNTER — Ambulatory Visit: Admitting: Orthopaedic Surgery

## 2024-07-16 ENCOUNTER — Other Ambulatory Visit (INDEPENDENT_AMBULATORY_CARE_PROVIDER_SITE_OTHER): Payer: Self-pay

## 2024-07-16 ENCOUNTER — Ambulatory Visit: Admitting: Physician Assistant

## 2024-07-16 ENCOUNTER — Encounter: Admitting: Psychology

## 2024-07-16 ENCOUNTER — Encounter: Payer: Self-pay | Admitting: Orthopaedic Surgery

## 2024-07-16 DIAGNOSIS — M7061 Trochanteric bursitis, right hip: Secondary | ICD-10-CM | POA: Diagnosis not present

## 2024-07-16 DIAGNOSIS — M8000XD Age-related osteoporosis with current pathological fracture, unspecified site, subsequent encounter for fracture with routine healing: Secondary | ICD-10-CM

## 2024-07-16 DIAGNOSIS — M25551 Pain in right hip: Secondary | ICD-10-CM

## 2024-07-16 DIAGNOSIS — M810 Age-related osteoporosis without current pathological fracture: Secondary | ICD-10-CM | POA: Diagnosis not present

## 2024-07-16 MED ORDER — DENOSUMAB 60 MG/ML ~~LOC~~ SOSY: 60.0000 mg | PREFILLED_SYRINGE | Freq: Once | SUBCUTANEOUS | Status: AC

## 2024-07-16 NOTE — Progress Notes (Signed)
 It has now been 5 months since the patient had an MRI of her right proximal femur showing a stress fracture around the subtrochanteric area.  She is only 53 years old.  She has been offloading that hip and going slow.  She does have a diagnosis of osteoporosis and apparently she is here also today to have her Prolia  injection to treat osteoporosis.  She has seen Ronal Dragon Persons, PA, who has been appropriately treating her osteoporosis.  She denies any thigh pain.  She does report some pain over the trochanteric area of the right thumb.  She also denies any groin pain.  On exam her right hip moves smoothly and fluidly with no blocks or rotation.  There is no pain in the subtrochanteric area of the hip.  There is appropriate pain over the trochanteric area consistent likely with some bursitis.  An AP pelvis and lateral of the right hip shows no cortical irregularities around the trochanteric area or the subtrochanteric area.  It does appear like the inner cortical area of the trochanteric area of the bone appears a little thicker but no worrisome findings at all.  This probably correlates with her previous stress fracture that was seen on MRI.  She did get her Prolia  injection today.  From my standpoint, we will see her back in 6 months and I would like an AP and lateral of her right hip.  This can be done supine and not standing.

## 2024-07-16 NOTE — Progress Notes (Unsigned)
 Patient in today for her Prolia  injection. She tolerated it well and will schedule her next appointment in 6 months

## 2024-09-03 ENCOUNTER — Encounter: Payer: Self-pay | Admitting: Radiology

## 2024-10-05 ENCOUNTER — Other Ambulatory Visit: Payer: Self-pay | Admitting: Student

## 2024-10-05 DIAGNOSIS — R188 Other ascites: Secondary | ICD-10-CM

## 2024-10-05 DIAGNOSIS — K719 Toxic liver disease, unspecified: Secondary | ICD-10-CM | POA: Diagnosis not present

## 2024-10-05 DIAGNOSIS — K219 Gastro-esophageal reflux disease without esophagitis: Secondary | ICD-10-CM | POA: Diagnosis not present

## 2024-10-05 DIAGNOSIS — K5900 Constipation, unspecified: Secondary | ICD-10-CM | POA: Diagnosis not present

## 2024-10-12 DIAGNOSIS — G4719 Other hypersomnia: Secondary | ICD-10-CM | POA: Diagnosis not present

## 2024-10-12 DIAGNOSIS — R4189 Other symptoms and signs involving cognitive functions and awareness: Secondary | ICD-10-CM | POA: Diagnosis not present

## 2024-10-12 DIAGNOSIS — R413 Other amnesia: Secondary | ICD-10-CM | POA: Diagnosis not present

## 2024-10-17 ENCOUNTER — Other Ambulatory Visit

## 2024-10-19 ENCOUNTER — Other Ambulatory Visit: Payer: Self-pay | Admitting: Student

## 2024-10-19 ENCOUNTER — Encounter: Payer: Self-pay | Admitting: Student

## 2024-10-19 DIAGNOSIS — R188 Other ascites: Secondary | ICD-10-CM

## 2024-10-23 ENCOUNTER — Ambulatory Visit
Admission: RE | Admit: 2024-10-23 | Discharge: 2024-10-23 | Disposition: A | Discharge status: Home / Self Care | Source: Ambulatory Visit | Attending: Student | Admitting: Student

## 2024-10-23 DIAGNOSIS — R188 Other ascites: Secondary | ICD-10-CM | POA: Diagnosis not present

## 2024-10-31 ENCOUNTER — Ambulatory Visit: Admitting: Orthopaedic Surgery

## 2024-10-31 ENCOUNTER — Other Ambulatory Visit: Payer: Self-pay

## 2024-10-31 DIAGNOSIS — M7061 Trochanteric bursitis, right hip: Secondary | ICD-10-CM

## 2024-10-31 DIAGNOSIS — M25551 Pain in right hip: Secondary | ICD-10-CM

## 2024-10-31 NOTE — Progress Notes (Signed)
 The patient is a 53 year old female that we have seen before with a stress fracture in the metaphyseal section of her right femur that has since resolved.  She comes in today with significant low back pain that radiates into both hips of the trochanteric of both hips and down to both knees.  We have had imaging studies before not showing any kind of acute changes.  X-rays today of her pelvis and right hip show no acute changes and the hip joints are well-maintained.  On exam she has significant pain of the trochanteric area of both hips and her low back.  She has a little more valgus malalignment of the right knee but no effusion of either knee and just tenderness and decreased mobility overall.  She does have a component of fibromyalgia and she has had significant comorbidities in the past.  She has also had a history of liver failure in the past.  That is now stable.  We talked in length in detail about weight loss as well as trying a course of outpatient physical therapy with any modalities that could help decrease her tendinosis of both hips and her low back pain.  She does have severe tendinosis of the gluteus minimus tendon seen on MRI of her right hip earlier this year.  She agrees with this treatment plan.  Will see her back in a month and we can consider steroid injections over the trochanteric area of both hips at that visit and even considering an injection in the left knee.

## 2024-11-06 ENCOUNTER — Ambulatory Visit
Admission: RE | Admit: 2024-11-06 | Discharge: 2024-11-06 | Disposition: A | Source: Ambulatory Visit | Attending: Student

## 2024-11-06 ENCOUNTER — Other Ambulatory Visit: Payer: Self-pay | Admitting: Student

## 2024-11-06 DIAGNOSIS — R188 Other ascites: Secondary | ICD-10-CM

## 2024-11-12 ENCOUNTER — Encounter: Payer: Self-pay | Admitting: Orthopaedic Surgery

## 2024-12-03 ENCOUNTER — Ambulatory Visit: Admitting: Orthopaedic Surgery

## 2024-12-06 ENCOUNTER — Ambulatory Visit: Admitting: Orthopaedic Surgery

## 2024-12-12 ENCOUNTER — Ambulatory Visit: Admitting: Orthopaedic Surgery

## 2024-12-17 ENCOUNTER — Ambulatory Visit: Admitting: Orthopaedic Surgery

## 2025-01-14 ENCOUNTER — Ambulatory Visit: Admitting: Orthopaedic Surgery
